# Patient Record
Sex: Female | Born: 1935 | Race: White | Hispanic: No | Marital: Married | Smoking: Former smoker
Health system: Southern US, Community
[De-identification: ages and names within clinical notes are randomized; demographics above are authoritative.]

## PROBLEM LIST (undated history)

## (undated) DIAGNOSIS — E7800 Pure hypercholesterolemia, unspecified: Secondary | ICD-10-CM

## (undated) DIAGNOSIS — J449 Chronic obstructive pulmonary disease, unspecified: Secondary | ICD-10-CM

## (undated) DIAGNOSIS — E039 Hypothyroidism, unspecified: Secondary | ICD-10-CM

## (undated) DIAGNOSIS — Z9889 Other specified postprocedural states: Secondary | ICD-10-CM

## (undated) DIAGNOSIS — C801 Malignant (primary) neoplasm, unspecified: Secondary | ICD-10-CM

## (undated) DIAGNOSIS — I10 Essential (primary) hypertension: Secondary | ICD-10-CM

## (undated) DIAGNOSIS — H269 Unspecified cataract: Secondary | ICD-10-CM

## (undated) DIAGNOSIS — K8020 Calculus of gallbladder without cholecystitis without obstruction: Secondary | ICD-10-CM

## (undated) DIAGNOSIS — I6200 Nontraumatic subdural hemorrhage, unspecified: Secondary | ICD-10-CM

## (undated) DIAGNOSIS — N189 Chronic kidney disease, unspecified: Secondary | ICD-10-CM

## (undated) DIAGNOSIS — R112 Nausea with vomiting, unspecified: Secondary | ICD-10-CM

## (undated) HISTORY — DX: Calculus of gallbladder without cholecystitis without obstruction: K80.20

## (undated) HISTORY — DX: Unspecified cataract: H26.9

## (undated) HISTORY — DX: Chronic obstructive pulmonary disease, unspecified: J44.9

## (undated) HISTORY — PX: BREAST BIOPSY: SHX20

## (undated) HISTORY — DX: Essential (primary) hypertension: I10

## (undated) HISTORY — DX: Hypothyroidism, unspecified: E03.9

## (undated) HISTORY — PX: PARTIAL NEPHRECTOMY: SHX414

## (undated) HISTORY — PX: EYE SURGERY: SHX253

## (undated) HISTORY — PX: OTHER SURGICAL HISTORY: SHX169

## (undated) HISTORY — DX: Pure hypercholesterolemia, unspecified: E78.00

## (undated) HISTORY — PX: DILATION AND CURETTAGE OF UTERUS: SHX78

## (undated) HISTORY — PX: ABDOMINAL HYSTERECTOMY: SHX81

## (undated) NOTE — *Deleted (*Deleted)
Carolyn [name]:[id number] DOB: Mar 26, 1933 DOA: 10/11/2020     PCP: Merlene Laughter, MD   Outpatient Specialists: * NONE CARDS: * Dr. NEphrology: *  Dr. NEurology *   Dr. Pulmonary *  Dr.  Oncology * Dr. Sandria Manly* Dr.  Deboraha Sprang, [facility]) Urology Dr. *  Patient arrived to ER on 10/11/20 at 1452 Referred by Attending Sabino Donovan, MD   Patient coming from: home Lives alone,   *** With family    Chief Complaint:   Chief Complaint  Patient presents with  . Shortness of Breath    HPI: Carolyn [name] is a 3 y.o. female with medical history significant of Dm2. HTN  history of traumatic subdural hematoma who follows up for cognitive changes and episodes of slurred speech , burning tongue syndrome. 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  COPD. CKD, kidney cancer, hypothyroidism, seizure DO  Presented with  Shortness of breath since this AM Have had weight loss and intermittent fevers, decreased appetite and fatigue   Infectious risk factors:  Reports  fever, shortness of breath, dry cough, chest pain, Sore throat, URI symptoms, anosmia/change in taste, N/V/Diarrhea/abdominal pain,  Body aches, severe fatigue Reports known sick contacts, known COVID 19 exposure, inability to completely isolate   Has  been vaccinated against COVID    Initial COVID TEST  NEGATIVE   Lab Results  Component Value Date   SARSCOV2NAA NEGATIVE 10/11/2020    Regarding pertinent Chronic problems:     Hyperlipidemia - *on statins Lipid Panel     Component Value Date/Time   CHOL 155 01/21/2016 0511   TRIG 50 01/21/2016 0511   HDL 59 01/21/2016 0511   CHOLHDL 2.6 01/21/2016 0511   VLDL 10 01/21/2016 0511   LDLCALC 86 01/21/2016 0511    HTN on lisinopril, metoprolol, verapamil  ***chronic CHF diastolic/systolic/ combined - last echo***  *** CAD  - On Aspirin, statin, betablocker, Plavix                 - *followed by cardiology                - last cardiac cath  The ASCVD Risk score Denman George DC  Jr., et al., 2013) failed to calculate for the following reasons:   The 2013 ASCVD risk score is only valid for ages 24 to 62    DM 2 -  Lab Results  Component Value Date   HGBA1C 6.7 (H) 01/21/2016   ****on insulin, PO meds only, diet controlled  ***Hypothyroidism:  Lab Results  Component Value Date   TSH 2.003 10/11/2020   on synthroid  *** Morbid obesity-   BMI Readings from Last 1 Encounters:  10/11/20 25.82 kg/m     *** Asthma -well *** controlled on home inhalers/ nebs f                        ***last no prior***admission  ***                       No ***history of intubation  *** COPD - not **followed by pulmonology *** not  on baseline oxygen  *L,    *** OSA -on nocturnal oxygen, *CPAP, *noncompliant with CPAP  *** Hx of CVA - *with/out residual deficits on Aspirin 81 mg, 325, Plavix  ***A. Fib -  - CHA2DS2 vas score **** CHA2DS2/VAS Stroke Risk Points      N/A >= 2 Points: High  Risk  1 - 1.99 Points: Medium Risk  0 Points: Low Risk    Last Change: N/A      This score determines the patient's risk of having a stroke if the  patient has atrial fibrillation.      This score is not applicable to this patient. Components are not  calculated.     current  on anticoagulation with ****Coumadin  ***Xarelto,* Eliquis,  *** Not on anticoagulation secondary to Risk of Falls, *** recurrent bleeding         -  Rate control:  Currently controlled with ***Toprolol,  *Metoprolol,* Diltiazem, *Coreg          - Rhythm control: *** amiodarone, *flecainide  ***CKD stage III*- baseline Cr **** Estimated Creatinine Clearance: 37.5 mL/min (A) (by C-G formula based on SCr of 1.12 mg/dL (H)).  Lab Results  Component Value Date   CREATININE 1.12 (H) 10/11/2020   CREATININE 0.70 04/05/2017   CREATININE 0.80 04/02/2017     **** Liver disease Computed MELD-Na score unavailable. Necessary lab results were not found in the last year. Computed MELD score unavailable. Necessary  lab results were not found in the last year.   ***BPH - on Flomax, Proscar    *** Dementia - on Aricept** Nemenda  *** Chronic anemia - baseline hg Hemoglobin & Hematocrit  Recent Labs    10/11/20 1550  HGB 7.2*      While in ER:    ER Provider Called:     DrMarland Kitchen  They Recommend admit to medicine *** Will see in AM*  in ER  Hospitalist was called for admission for ***  The following Work up has been ordered so far:  Orders Placed This Encounter  Procedures  . Resp Panel by RT-PCR (Flu A&B, Covid) Nasopharyngeal Swab  . DG Chest Port 1 View  . CT Angio Chest PE W and/or Wo Contrast  . CBC with Differential  . Comprehensive metabolic panel  . Urinalysis, Routine w reflex microscopic  . Magnesium  . D-dimer, quantitative (not at [facility])  . TSH  . Pathologist smear review  . If O2 Sat <94% administer O2 at 2 liters/minute via nasal cannula  . Consult to hospitalist  ALL PATIENTS BEING ADMITTED/HAVING PROCEDURES NEED COVID-19 SCREENING  . ED EKG  . EKG 12-Lead      Following Medications were ordered in ER: Medications  lactated ringers bolus 1,000 mL (0 mLs Intravenous Stopped 10/11/20 1722)  sodium chloride (PF) 0.9 % injection (  Given by Other 10/11/20 1917)  iohexol (OMNIPAQUE) 350 MG/ML injection 60 mL (60 mLs Intravenous Contrast Given 10/11/20 1909)        Consult Orders  (From admission, onward)         Start     Ordered   10/11/20 2022  Consult to hospitalist  ALL PATIENTS BEING ADMITTED/HAVING PROCEDURES NEED COVID-19 SCREENING  Once       Comments: ALL PATIENTS BEING ADMITTED/HAVING PROCEDURES NEED COVID-19 SCREENING  Provider:  (Not yet assigned)  Question Answer Comment  Place call to: [facility]   Reason for Consult Admit      10/11/20 2021            Significant initial  Findings: Abnormal Labs Reviewed  CBC WITH DIFFERENTIAL/PLATELET - Abnormal; Notable for the following components:      Result Value   WBC 83.2 (*)     RBC 2.25 (*)    Hemoglobin 7.2 (*)    HCT 22.0 (*)  RDW 18.0 (*)    Platelets 22 (*)    nRBC 0.3 (*)    Lymphs Abs 7.5 (*)    Monocytes Absolute 5.0 (*)    All other components within normal limits  COMPREHENSIVE METABOLIC PANEL - Abnormal; Notable for the following components:   Sodium 134 (*)    Glucose, Bld 315 (*)    BUN 25 (*)    Creatinine, Ser 1.12 (*)    Albumin 3.1 (*)    AST 51 (*)    ALT 52 (*)    Alkaline Phosphatase 150 (*)    GFR, Estimated 48 (*)    All other components within normal limits  D-DIMER, QUANTITATIVE (NOT AT [facility]) - Abnormal; Notable for the following components:   D-Dimer, Quant 6.11 (*)    All other components within normal limits      Otherwise labs showing:    Recent Labs  Lab 10/11/20 1550  NA 134*  K 3.7  CO2 23  GLUCOSE 315*  BUN 25*  CREATININE 1.12*  CALCIUM 9.7  MG 1.8    Cr  * stable,  Up from baseline see below Lab Results  Component Value Date   CREATININE 1.12 (H) 10/11/2020   CREATININE 0.70 04/05/2017   CREATININE 0.80 04/02/2017    Recent Labs  Lab 10/11/20 1550  AST 51*  ALT 52*  ALKPHOS 150*  BILITOT 0.7  PROT 7.9  ALBUMIN 3.1*   Lab Results  Component Value Date   CALCIUM 9.7 10/11/2020   PHOS 2.3 (L) 02/22/2016          WBC      Component Value Date/Time   WBC 83.2 (HH) 10/11/2020 1550   LYMPHSABS 7.5 (H) 10/11/2020 1550   MONOABS 5.0 (H) 10/11/2020 1550   EOSABS 0.0 10/11/2020 1550   BASOSABS 0.0 10/11/2020 1550        Plt: Lab Results  Component Value Date   PLT 22 (LL) 10/11/2020     Lactic Acid, Venous No results found for: LATICACIDVEN @ (RESUTFAST[lacticacid:4)@  Procalcitonin *** Ordered   COVID-19 Labs  Recent Labs    10/11/20 1550  DDIMER 6.11*    Lab Results  Component Value Date   SARSCOV2NAA NEGATIVE 10/11/2020      Arterial ***Venous  Blood Gas result:  pH *** pCO2 ***; pO2 ***;     %O2 Sat ***.  ABG    Component Value Date/Time   TCO2 29  04/15/2016 1309      HG/HCT * stable,  Down *Up from baseline see below    Component Value Date/Time   HGB 7.2 (L) 10/11/2020 1550   HCT 22.0 (L) 10/11/2020 1550   MCV 97.8 10/11/2020 1550    No results for input(s): LIPASE, AMYLASE in the last 168 hours. No results for input(s): AMMONIA in the last 168 hours.     Troponin ***ordered Cardiac Panel (last 3 results) No results for input(s): CKTOTAL, CKMB, TROPONINI, RELINDX in the last 72 hours.     ECG: Ordered Personally reviewed by me showing: HR : *** Rhythm: *NSR, Sinus tachycardia * A.fib. W RVR, RBBB, LBBB, Paced Ischemic changes*nonspecific changes, no evidence of ischemic changes QTC*   BNP (last 3 results) No results for input(s): BNP in the last 8760 hours.    DM  labs:  HbA1C: No results for input(s): HGBA1C in the last 8760 hours.     CBG (last 3)  No results for input(s): GLUCAP in the last 72 hours.  UA *** no evidence of UTI  ***Pending ***not ordered   Urine analysis:    Component Value Date/Time   COLORURINE YELLOW 10/11/2020 1845   APPEARANCEUR CLEAR 10/11/2020 1845   LABSPEC 1.012 10/11/2020 1845   PHURINE 6.0 10/11/2020 1845   GLUCOSEU NEGATIVE 10/11/2020 1845   HGBUR NEGATIVE 10/11/2020 1845   BILIRUBINUR NEGATIVE 10/11/2020 1845   KETONESUR NEGATIVE 10/11/2020 1845   PROTEINUR NEGATIVE 10/11/2020 1845   UROBILINOGEN 1.0 03/27/2011 1106   NITRITE NEGATIVE 10/11/2020 1845   LEUKOCYTESUR NEGATIVE 10/11/2020 1845       Ordered  CT HEAD *** NON acute  CXR - ***NON acute  CTabd/pelvis - ***nonacute  CTA chest - ***nonacute, no PE, * no evidence of infiltrate      ED Triage Vitals  Enc Vitals Group     BP 10/11/20 1502 139/67     Pulse Rate 10/11/20 1502 84     Resp 10/11/20 1502 20     Temp 10/11/20 1502 98 F (36.7 C)     Temp Source 10/11/20 1502 Oral     SpO2 10/11/20 1502 99 %     Weight 10/11/20 1503 160 lb (72.6 kg)     Height 10/11/20 1503 5\' 6"  (1.676 m)      Head Circumference --      Peak Flow --      Pain Score 10/11/20 1503 0     Pain Loc --      Pain Edu? --      Excl. in GC? --   TMAX(24)@       Latest  Blood pressure 121/68, pulse 77, temperature 98 F (36.7 C), temperature source Oral, resp. rate (!) 24, height 5\' 6"  (1.676 m), weight 72.6 kg, last menstrual period 03/24/2010, SpO2 98 %.       Review of Systems:    Pertinent positives include: ***  Constitutional:  No weight loss, night sweats, Fevers, chills, fatigue, weight loss  HEENT:  No headaches, Difficulty swallowing,Tooth/dental problems,Sore throat,  No sneezing, itching, ear ache, nasal congestion, post nasal drip,  Cardio-vascular:  No chest pain, Orthopnea, PND, anasarca, dizziness, palpitations.no Bilateral lower extremity swelling  GI:  No heartburn, indigestion, abdominal pain, nausea, vomiting, diarrhea, change in bowel habits, loss of appetite, melena, blood in stool, hematemesis Resp:  no shortness of breath at rest. No dyspnea on exertion, No excess mucus, no productive cough, No non-productive cough, No coughing up of blood.No change in color of mucus.No wheezing. Skin:  no rash or lesions. No jaundice GU:  no dysuria, change in color of urine, no urgency or frequency. No straining to urinate.  No flank pain.  Musculoskeletal:  No joint pain or no joint swelling. No decreased range of motion. No back pain.  Psych:  No change in mood or affect. No depression or anxiety. No memory loss.  Neuro: no localizing neurological complaints, no tingling, no weakness, no double vision, no gait abnormality, no slurred speech, no confusion  All systems reviewed and apart from HOPI all are negative  Past Medical History:   Past Medical History:  Diagnosis Date  . Asthma   . Cancer (HCC)   . Cataracts, bilateral   . Chronic kidney disease 2007   kidney cancer and partial nephrctomy-right  . COPD (chronic obstructive pulmonary disease) (HCC)   . Diabetes  mellitus   . Gallstones   . Glaucoma   . Hypercholesterolemia   . Hypertension   . Hypothyroid   . PONV (postoperative nausea  and vomiting)   . Subdural bleeding [facility])       Past Surgical History:  Procedure Laterality Date  . ABDOMINAL HYSTERECTOMY    . BREAST BIOPSY    . Cateract extractions and IOL    . CHOLECYSTECTOMY  2008  . CRANIOTOMY Left 02/06/2016   Procedure: CRANIOTOMY HEMATOMA EVACUATION SUBDURAL;  Surgeon: Loura Halt Ditty, MD;  Location: [facility] NEUR[facility]S;  Service: Neurosurgery;  Laterality: Left;  . CRANIOTOMY N/A 02/17/2016   Procedure: CRANIOTOMY HEMATOMA EVACUATION SUBDURAL;  Surgeon: Loura Halt Ditty, MD;  Location: [facility] NEURO ORS;  Service: Neurosurgery;  Laterality: N/A;  left  . CRANIOTOMY Left 02/18/2016   Procedure: CRANIOTOMY HEMATOMA EVACUATION SUBDURAL;  Surgeon: Julio Sicks, MD;  Location: [facility] NEURO ORS;  Service: Neurosurgery;  Laterality: Left;  . D&C for enlarged uterine  210  . DILATION AND CURETTAGE OF UTERUS    . EYE SURGERY  08/2011,09/2012   bilateral cataracts  . HERNIA REPAIR  01/19/12   inguinal  . INGUINAL HERNIA REPAIR  01/19/2012   Procedure: HERNIA REPAIR INGUINAL ADULT;  Surgeon: Velora Heckler, MD;  Location: [facility] ORS;  Service: General;  Laterality: Left;  repair left inguinal hernia with mesh   . PARTIAL NEPHRECTOMY     cancer  . TOTAL ABDOMINAL HYSTERECTOMY W/ BILATERAL SALPINGOOPHORECTOMY  2011    Social History:  Ambulatory *** independently cane, walker  wheelchair bound, bed bound     reports that she quit smoking about 38 years ago. Her smoking use included cigarettes. She has a 5.00 pack-year smoking history. She has never used smokeless tobacco. She reports that she does not drink alcohol and does not use drugs.     Family History: *** Family History  Problem Relation Age of Onset  . Hypertension Mother   . Hypertension Father   . Breast cancer Paternal Grandmother   . Diabetes Paternal Grandmother   . Hypertension  Paternal Grandmother   . Stroke Paternal Grandmother   . Stroke Paternal Grandfather   . Heart attack Paternal Grandfather     Allergies: Allergies  Allergen Reactions  . Darvocet [Propoxyphene N-Acetaminophen] Nausea And Vomiting  . Metformin And Related Diarrhea  . Percocet [Oxycodone-Acetaminophen] Nausea And Vomiting  . Prednisone Swelling  . Topamax [Topiramate] Other (See Comments)    Unable to speak/swallow  . Tramadol Other (See Comments)    Masked seizure symptoms  . Vimpat [Lacosamide] Swelling    tongue  . Vicodin [Hydrocodone-Acetaminophen] Nausea And Vomiting     Prior to Admission medications   Medication Sig Start Date End Date Taking? Authorizing Provider  acetaminophen (TYLENOL) 500 MG tablet Take 500 mg by mouth every 6 (six) hours as needed for headache (pain).    [provider]  albuterol (PROVENTIL HFA;VENTOLIN HFA) 108 (90 Base) MCG/ACT inhaler Inhale 1 puff into the lungs every 6 (six) hours as needed for wheezing or shortness of breath.    [provider]  docusate sodium (ENEMEEZ) 283 MG enema Place 1 enema (283 mg total) rectally daily. Patient not taking: Reported on 09/21/2017 04/02/17   Gerhard Munch, MD  Fluticasone-Salmeterol (ADVAIR) 250-50 MCG/DOSE AEPB Inhale 1 puff into the lungs 2 (two) times daily as needed (shortness of breath/ chest tightness).    [provider]  glimepiride (AMARYL) 1 MG tablet Take 1 mg by mouth at bedtime.    [provider]  glimepiride (AMARYL) 2 MG tablet Take 2 tablets (4 mg total) by mouth at bedtime. Patient not taking: Reported on  04/05/2017 03/06/16   Angiulli, Mcarthur Rossetti, PA-C  latanoprost (XALATAN) 0.005 % ophthalmic solution Place 1 drop into both eyes at bedtime.     [provider]  levothyroxine (SYNTHROID) 88 MCG tablet Take 88 mcg by mouth every morning. 04/04/20   [provider]  lisinopril (PRINIVIL,ZESTRIL) 20 MG tablet Take 1 tablet (20 mg total) by  mouth at bedtime. 03/06/16   Angiulli, Mcarthur Rossetti, PA-C  metoprolol tartrate (LOPRESSOR) 25 MG tablet Take 1 tablet (25 mg total) by mouth 2 (two) times daily. Patient not taking: Reported on 04/05/2017 03/06/16   Angiulli, Mcarthur Rossetti, PA-C  ondansetron (ZOFRAN ODT) 4 MG disintegrating tablet Take 1 tablet (4 mg total) by mouth every 8 (eight) hours as needed for nausea or vomiting. Patient not taking: Reported on 04/29/2020 04/05/17   Joy, Ines Bloomer C, PA-C  ondansetron (ZOFRAN) 4 MG tablet Take 1 tablet (4 mg total) by mouth every 8 (eight) hours as needed for nausea or vomiting. Patient not taking: Reported on 04/29/2020 03/24/17   Virgina Norfolk, DO  pantoprazole (PROTONIX) 40 MG tablet Take 1 tablet (40 mg total) by mouth daily. 03/06/16   Angiulli, Mcarthur Rossetti, PA-C  polyethylene glycol (MIRALAX / GLYCOLAX) packet Take 17 g by mouth daily as needed (constipation). Mix in 8 oz liquid and drink    [provider]  theophylline (UNIPHYL) 400 MG 24 hr tablet Take 400 mg by mouth at bedtime.     [provider]  thyroid (ARMOUR) 60 MG tablet Take 60 mg by mouth at bedtime.  Patient not taking: Reported on 04/29/2020    [provider]  verapamil (CALAN-SR) 180 MG CR tablet Take 180 mg by mouth at bedtime.    [provider]   Physical Exam: Vitals with BMI 10/11/2020 10/11/2020 10/11/2020  Height - - -  Weight - - -  BMI - - -  Systolic 121 118 161  Diastolic 68 73 60  Pulse 77 - 74     1. General:  in No ***Acute distress***increased work of breathing ***complaining of severe pain****agitated * Chronically ill *well *cachectic *toxic acutely ill -appearing 2. Psychological: Alert and *** Oriented 3. Head/ENT:   Moist *** Dry Mucous Membranes                          Head Non traumatic, neck supple                          Normal *** Poor Dentition 4. SKIN: normal *** decreased Skin turgor,  Skin clean Dry and intact no rash 5. Heart: Regular rate and rhythm no***  Murmur, no Rub or gallop 6. Lungs: ***Clear to auscultation bilaterally, no wheezes or crackles   7. Abdomen: Soft, ***non-tender, Non distended *** obese ***bowel sounds present 8. Lower extremities: no clubbing, cyanosis, no ***edema 9. Neurologically Grossly intact, moving all 4 extremities equally *** strength 5 out of 5 in all 4 extremities cranial nerves II through XII intact 10. MSK: Normal range of motion   All other LABS:     Recent Labs  Lab 10/11/20 1550  WBC 83.2*  NEUTROABS 1.7  HGB 7.2*  HCT 22.0*  MCV 97.8  PLT 22*     Recent Labs  Lab 10/11/20 1550  NA 134*  K 3.7  CL 99  CO2 23  GLUCOSE 315*  BUN 25*  CREATININE 1.12*  CALCIUM 9.7  MG 1.8  Recent Labs  Lab 10/11/20 1550  AST 51*  ALT 52*  ALKPHOS 150*  BILITOT 0.7  PROT 7.9  ALBUMIN 3.1*       Cultures:    Component Value Date/Time   SDES URINE, CLEAN CATCH 03/24/2017 1653   SPECREQUEST Normal 03/24/2017 1653   CULT MULTIPLE SPECIES PRESENT, SUGGEST RECOLLECTION (A) 03/24/2017 1653   REPTSTATUS 03/26/2017 FINAL 03/24/2017 1653     Radiological Exams on Admission: CT Angio Chest PE W and/or Wo Contrast  Result Date: 10/11/2020 CLINICAL DATA:  52 year old female with positive D-dimer. Concern for pulmonary embolism. EXAM: CT ANGIOGRAPHY CHEST WITH CONTRAST TECHNIQUE: Multidetector CT imaging of the chest was performed using the standard protocol during bolus administration of intravenous contrast. Multiplanar CT image reconstructions and MIPs were obtained to evaluate the vascular anatomy. CONTRAST:  60mL OMNIPAQUE IOHEXOL 350 MG/ML SOLN COMPARISON:  Chest CT dated 10/15/2011. Chest radiograph dated 10/11/2020 FINDINGS: Cardiovascular: There is mild cardiomegaly. Three vessel coronary vascular calcification. No pericardial effusion. Top-normal ascending aorta measuring up to 4 cm on sagittal AP diameter. There is mild atherosclerotic calcification of the thoracic aorta. There is  dilatation of the main pulmonary trunk suggestive of a degree of pulmonary hypertension. Clinical correlation is recommended. Evaluation of the pulmonary arteries is limited due to respiratory motion artifact. No large or central pulmonary artery embolus identified. Mediastinum/Nodes: There is no hilar or mediastinal adenopathy. The esophagus is grossly unremarkable. No mediastinal fluid collection. Lungs/Pleura: There is a 5 mm left upper lobe nodule not significantly changed since the study of 2012. 5 mm nodule in the left lower lobe along the fissure (66/6) increased in size since study of 2012. There is a 1 cm left lung base subpleural nodule (94/6). No lobar consolidation, pleural effusion or pneumothorax. The central airways are patent. Upper Abdomen: No acute abnormality. Musculoskeletal: No chest wall abnormality. No acute or significant osseous findings. Review of the MIP images confirms the above findings. IMPRESSION: 1. No acute intrathoracic pathology. No CT evidence of central pulmonary artery embolus. 2. Mild cardiomegaly with three vessel coronary vascular calcification. 3. Dilatation of the main pulmonary trunk suggestive of a degree of pulmonary hypertension. Clinical correlation is recommended. 4. Several pulmonary nodules measuring up to 1 cm in the left lower lobe. Non-contrast chest CT at 3-6 months is recommended. If the nodules are stable at time of repeat CT, then future CT at 18-24 months (from today's scan) is considered optional for low-risk patients, but is recommended for high-risk patients. This recommendation follows the consensus statement: Guidelines for Management of Incidental Pulmonary Nodules Detected on CT Images: From the Fleischner Society 2017; Radiology 2017; 284:228-243. 5. Aortic Atherosclerosis (ICD10-I70.0). Electronically Signed   By: Elgie Collard M.D.   On: 10/11/2020 19:39   DG Chest Port 1 View  Result Date: 10/11/2020 CLINICAL DATA:  Shortness of breath  EXAM: PORTABLE CHEST 1 VIEW COMPARISON:  03/20/2020 FINDINGS: No focal opacity or pleural effusion. Normal cardiomediastinal silhouette with aortic atherosclerosis. No pneumothorax. IMPRESSION: No active disease. Electronically Signed   By: Jasmine Pang M.D.   On: 10/11/2020 16:46    Chart has been reviewed    Assessment/Plan  ***  Admitted for ***  Present on Admission: **None**   Other plan as per orders.  DVT prophylaxis:  SCD *** Lovenox       Code Status:    Code Status: Prior FULL CODE *** DNR/DNI ***comfort care as per patient ***family  I had personally discussed CODE STATUS with patient and  family* I had spent *min discussing goals of care and CODE STATUS    Family Communication:   Family not at  Bedside  plan of care was discussed on the phone with *** Son, Daughter, Wife, Husband, Sister, Brother , father, mother  Disposition Plan:   *** likely will need placement for rehabilitation                          Back to current facility when stable                            To home once workup is complete and patient is stable  ***Following barriers for discharge:                            Electrolytes corrected                               Anemia corrected                             Pain controlled with PO medications                               Afebrile, white count improving able to transition to PO antibiotics                             Will need to be able to tolerate PO                            Will likely need home health, home O2, set up                           Will need consultants to evaluate patient prior to discharge  ****EXPECT DC tomorrow                    ***Would benefit from PT/OT eval prior to DC  Ordered                   Swallow eval - SLP ordered                   Diabetes care coordinator                   Transition of care consulted                   Nutrition    consulted                  Wound care  consulted                    Palliative care    consulted                   Behavioral health  consulted                    Consults called: ***    Admission status:  ED Disposition    None  Obs***  ***  inpatient     I Expect 2 midnight stay secondary to severity of patient's current illness need for inpatient interventions justified by the following: ***hemodynamic instability despite optimal treatment (tachycardia *hypotension * tachypnea *hypoxia, hypercapnia) * Severe lab/radiological/exam abnormalities including:     and extensive comorbidities including: *substance abuse  *Chronic pain *DM2  * CHF * CAD  * COPD/asthma *Morbid Obesity * CKD *dementia *liver disease *history of stroke with residual deficits *  malignancy, * sickle cell disease  . History of amputation . Chronic anticoagulation  That are currently affecting medical management.   I expect  patient to be hospitalized for 2 midnights requiring inpatient medical care.  Patient is at high risk for adverse outcome (such as loss of life or disability) if not treated.  Indication for inpatient stay as follows:  Severe change from baseline regarding mental status Hemodynamic instability despite maximal medical therapy,  ongoing suicidal ideations,  severe pain requiring acute inpatient management,  inability to maintain oral hydration   persistent chest pain despite medical management Need for operative/procedural  intervention New or worsening hypoxia  Need for IV antibiotics, IV fluids, IV rate controling medications, IV antihypertensives, IV pain medications, IV anticoagulation    Level of care   *** tele  For 12H 24H     medical floor       SDU tele indefinitely please discontinue once patient no longer qualifies COVID-19 Labs    Lab Results  Component Value Date   SARSCOV2NAA NEGATIVE 10/11/2020     Precautions: admitted as *** Covid Negative  ***asymptomatic screening protocol****PUI *** covid positive  No active isolations ***If Covid PCR is negative  - please DC precautions - would need additional investigation given very high risk for false native test result   PPE: Used by the provider:   N95***P100  eye Goggles,  Gloves ***gown     Critical***  Patient is critically ill due to  hemodynamic instability * respiratory failure *severe sepsis* ongoing chest pain*  They are at high risk for life/limb threatening clinical deterioration requiring frequent reassessment and modifications of care.  Services provided include examination of the patient, review of relevant ancillary tests, prescription of lifesaving therapies, review of medications and prophylactic therapy.  Total critical care time excluding separately billable procedures: 60*  Minutes.    Therisa Doyne 10/11/2020, 8:35 PM ***  [facility]     after 2 AM please page floor coverage PA If 7AM-7PM, please contact the day team taking care of the patient using [email or web address]   Patient was evaluated in the context of the global COVID-19 pandemic, which necessitated consideration that the patient might be at risk for infection with the SARS-CoV-2 virus that causes COVID-19. Institutional protocols and algorithms that pertain to the evaluation of patients at risk for COVID-19 are in a state of rapid change based on information released by regulatory bodies including the CDC and federal and state organizations. These policies and algorithms were followed during the patient's care.

---

## 1998-03-08 ENCOUNTER — Ambulatory Visit (HOSPITAL_BASED_OUTPATIENT_CLINIC_OR_DEPARTMENT_OTHER): Admission: RE | Admit: 1998-03-08 | Discharge: 1998-03-08 | Payer: Self-pay | Admitting: Surgery

## 2000-04-14 ENCOUNTER — Other Ambulatory Visit: Admission: RE | Admit: 2000-04-14 | Discharge: 2000-04-14 | Payer: Self-pay | Admitting: *Deleted

## 2000-04-20 ENCOUNTER — Encounter: Payer: Self-pay | Admitting: *Deleted

## 2000-04-20 ENCOUNTER — Encounter: Admission: RE | Admit: 2000-04-20 | Discharge: 2000-04-20 | Payer: Self-pay | Admitting: *Deleted

## 2000-05-04 ENCOUNTER — Encounter (INDEPENDENT_AMBULATORY_CARE_PROVIDER_SITE_OTHER): Payer: Self-pay

## 2000-05-04 ENCOUNTER — Other Ambulatory Visit: Admission: RE | Admit: 2000-05-04 | Discharge: 2000-05-04 | Payer: Self-pay | Admitting: *Deleted

## 2001-09-14 ENCOUNTER — Encounter: Payer: Self-pay | Admitting: Internal Medicine

## 2001-09-14 ENCOUNTER — Encounter: Admission: RE | Admit: 2001-09-14 | Discharge: 2001-09-14 | Payer: Self-pay | Admitting: Internal Medicine

## 2001-09-14 ENCOUNTER — Other Ambulatory Visit: Admission: RE | Admit: 2001-09-14 | Discharge: 2001-09-14 | Payer: Self-pay | Admitting: Surgery

## 2001-09-14 ENCOUNTER — Encounter: Admission: RE | Admit: 2001-09-14 | Discharge: 2001-09-14 | Payer: Self-pay | Admitting: Surgery

## 2001-09-14 ENCOUNTER — Encounter (INDEPENDENT_AMBULATORY_CARE_PROVIDER_SITE_OTHER): Payer: Self-pay | Admitting: Specialist

## 2001-09-14 ENCOUNTER — Encounter: Payer: Self-pay | Admitting: Surgery

## 2001-12-13 ENCOUNTER — Other Ambulatory Visit: Admission: RE | Admit: 2001-12-13 | Discharge: 2001-12-13 | Payer: Self-pay | Admitting: Internal Medicine

## 2004-07-01 ENCOUNTER — Other Ambulatory Visit: Admission: RE | Admit: 2004-07-01 | Discharge: 2004-07-01 | Payer: Self-pay | Admitting: Internal Medicine

## 2004-07-09 ENCOUNTER — Encounter: Admission: RE | Admit: 2004-07-09 | Discharge: 2004-07-09 | Payer: Self-pay | Admitting: Internal Medicine

## 2005-11-16 DIAGNOSIS — N189 Chronic kidney disease, unspecified: Secondary | ICD-10-CM

## 2005-11-16 HISTORY — DX: Chronic kidney disease, unspecified: N18.9

## 2006-03-18 ENCOUNTER — Ambulatory Visit (HOSPITAL_COMMUNITY): Admission: RE | Admit: 2006-03-18 | Discharge: 2006-03-18 | Payer: Self-pay | Admitting: Gastroenterology

## 2006-03-18 ENCOUNTER — Encounter (INDEPENDENT_AMBULATORY_CARE_PROVIDER_SITE_OTHER): Payer: Self-pay | Admitting: Specialist

## 2006-05-10 ENCOUNTER — Encounter: Admission: RE | Admit: 2006-05-10 | Discharge: 2006-05-10 | Payer: Self-pay | Admitting: Gastroenterology

## 2006-05-24 ENCOUNTER — Ambulatory Visit (HOSPITAL_COMMUNITY): Admission: RE | Admit: 2006-05-24 | Discharge: 2006-05-24 | Payer: Self-pay | Admitting: Urology

## 2006-07-05 ENCOUNTER — Inpatient Hospital Stay (HOSPITAL_COMMUNITY): Admission: RE | Admit: 2006-07-05 | Discharge: 2006-07-08 | Payer: Self-pay | Admitting: Urology

## 2006-07-05 ENCOUNTER — Encounter (INDEPENDENT_AMBULATORY_CARE_PROVIDER_SITE_OTHER): Payer: Self-pay | Admitting: *Deleted

## 2006-09-21 ENCOUNTER — Other Ambulatory Visit: Admission: RE | Admit: 2006-09-21 | Discharge: 2006-09-21 | Payer: Self-pay | Admitting: Internal Medicine

## 2006-10-05 ENCOUNTER — Encounter: Admission: RE | Admit: 2006-10-05 | Discharge: 2006-10-05 | Payer: Self-pay | Admitting: Internal Medicine

## 2006-10-14 ENCOUNTER — Encounter: Admission: RE | Admit: 2006-10-14 | Discharge: 2006-10-14 | Payer: Self-pay | Admitting: Internal Medicine

## 2006-11-16 HISTORY — PX: CHOLECYSTECTOMY: SHX55

## 2007-01-27 ENCOUNTER — Ambulatory Visit (HOSPITAL_COMMUNITY): Admission: RE | Admit: 2007-01-27 | Discharge: 2007-01-27 | Payer: Self-pay | Admitting: Urology

## 2007-08-04 ENCOUNTER — Ambulatory Visit (HOSPITAL_COMMUNITY): Admission: RE | Admit: 2007-08-04 | Discharge: 2007-08-04 | Payer: Self-pay | Admitting: Urology

## 2007-08-24 ENCOUNTER — Encounter: Admission: RE | Admit: 2007-08-24 | Discharge: 2007-08-24 | Payer: Self-pay | Admitting: Surgery

## 2007-09-08 ENCOUNTER — Encounter (INDEPENDENT_AMBULATORY_CARE_PROVIDER_SITE_OTHER): Payer: Self-pay | Admitting: Surgery

## 2007-09-08 ENCOUNTER — Ambulatory Visit (HOSPITAL_COMMUNITY): Admission: RE | Admit: 2007-09-08 | Discharge: 2007-09-09 | Payer: Self-pay | Admitting: Surgery

## 2007-11-30 ENCOUNTER — Encounter: Admission: RE | Admit: 2007-11-30 | Discharge: 2007-11-30 | Payer: Self-pay | Admitting: Internal Medicine

## 2007-12-05 ENCOUNTER — Encounter: Admission: RE | Admit: 2007-12-05 | Discharge: 2007-12-05 | Payer: Self-pay | Admitting: Internal Medicine

## 2008-02-06 ENCOUNTER — Ambulatory Visit (HOSPITAL_COMMUNITY): Admission: RE | Admit: 2008-02-06 | Discharge: 2008-02-06 | Payer: Self-pay | Admitting: Urology

## 2008-08-01 ENCOUNTER — Ambulatory Visit (HOSPITAL_COMMUNITY): Admission: RE | Admit: 2008-08-01 | Discharge: 2008-08-01 | Payer: Self-pay | Admitting: Urology

## 2009-03-11 ENCOUNTER — Encounter: Admission: RE | Admit: 2009-03-11 | Discharge: 2009-03-11 | Payer: Self-pay | Admitting: Internal Medicine

## 2009-08-14 ENCOUNTER — Ambulatory Visit (HOSPITAL_COMMUNITY): Admission: RE | Admit: 2009-08-14 | Discharge: 2009-08-14 | Payer: Self-pay | Admitting: Urology

## 2009-11-16 HISTORY — PX: TOTAL ABDOMINAL HYSTERECTOMY W/ BILATERAL SALPINGOOPHORECTOMY: SHX83

## 2010-05-07 ENCOUNTER — Encounter (INDEPENDENT_AMBULATORY_CARE_PROVIDER_SITE_OTHER): Payer: Self-pay | Admitting: Obstetrics and Gynecology

## 2010-05-07 ENCOUNTER — Inpatient Hospital Stay (HOSPITAL_COMMUNITY): Admission: RE | Admit: 2010-05-07 | Discharge: 2010-05-09 | Payer: Self-pay | Admitting: Obstetrics and Gynecology

## 2011-02-01 LAB — GLUCOSE, CAPILLARY
Glucose-Capillary: 146 mg/dL — ABNORMAL HIGH (ref 70–99)
Glucose-Capillary: 150 mg/dL — ABNORMAL HIGH (ref 70–99)
Glucose-Capillary: 160 mg/dL — ABNORMAL HIGH (ref 70–99)
Glucose-Capillary: 193 mg/dL — ABNORMAL HIGH (ref 70–99)
Glucose-Capillary: 197 mg/dL — ABNORMAL HIGH (ref 70–99)
Glucose-Capillary: 219 mg/dL — ABNORMAL HIGH (ref 70–99)
Glucose-Capillary: 237 mg/dL — ABNORMAL HIGH (ref 70–99)
Glucose-Capillary: 245 mg/dL — ABNORMAL HIGH (ref 70–99)

## 2011-02-01 LAB — CBC
HCT: 33.3 % — ABNORMAL LOW (ref 36.0–46.0)
HCT: 42.2 % (ref 36.0–46.0)
Hemoglobin: 11.5 g/dL — ABNORMAL LOW (ref 12.0–15.0)
Hemoglobin: 14.4 g/dL (ref 12.0–15.0)
MCH: 30.2 pg (ref 26.0–34.0)
MCHC: 34 g/dL (ref 30.0–36.0)
MCHC: 34.5 g/dL (ref 30.0–36.0)
MCV: 87.6 fL (ref 78.0–100.0)
MCV: 88 fL (ref 78.0–100.0)
Platelets: 165 10*3/uL (ref 150–400)
Platelets: 216 10*3/uL (ref 150–400)
RBC: 3.8 MIL/uL — ABNORMAL LOW (ref 3.87–5.11)
RBC: 4.8 MIL/uL (ref 3.87–5.11)
RDW: 13.8 % (ref 11.5–15.5)
RDW: 13.9 % (ref 11.5–15.5)
WBC: 10 10*3/uL (ref 4.0–10.5)
WBC: 10.4 10*3/uL (ref 4.0–10.5)

## 2011-02-01 LAB — BASIC METABOLIC PANEL
BUN: 9 mg/dL (ref 6–23)
CO2: 26 mEq/L (ref 19–32)
Calcium: 9.7 mg/dL (ref 8.4–10.5)
Chloride: 106 mEq/L (ref 96–112)
Creatinine, Ser: 0.74 mg/dL (ref 0.4–1.2)
GFR calc Af Amer: >60 mL/min (ref >60)
GFR calc non Af Amer: >60 mL/min (ref >60)
Glucose, Bld: 230 mg/dL — ABNORMAL HIGH (ref 70–99)
Potassium: 3.4 mEq/L — ABNORMAL LOW (ref 3.5–5.1)
Sodium: 137 mEq/L (ref 135–145)

## 2011-02-01 LAB — SURGICAL PCR SCREEN
MRSA, PCR: NEGATIVE
Staphylococcus aureus: NEGATIVE

## 2011-03-25 ENCOUNTER — Other Ambulatory Visit: Payer: Self-pay | Admitting: Gastroenterology

## 2011-03-25 ENCOUNTER — Ambulatory Visit
Admission: RE | Admit: 2011-03-25 | Discharge: 2011-03-25 | Disposition: A | Discharge status: Home / Self Care | Payer: Medicare Other | Source: Ambulatory Visit | Attending: Gastroenterology | Admitting: Gastroenterology

## 2011-03-25 DIAGNOSIS — R197 Diarrhea, unspecified: Secondary | ICD-10-CM

## 2011-03-25 DIAGNOSIS — R1084 Generalized abdominal pain: Secondary | ICD-10-CM

## 2011-03-27 ENCOUNTER — Emergency Department (HOSPITAL_COMMUNITY): Payer: Medicare Other

## 2011-03-27 ENCOUNTER — Inpatient Hospital Stay (HOSPITAL_COMMUNITY)
Admission: EM | Admit: 2011-03-27 | Discharge: 2011-03-30 | DRG: 392 | Disposition: A | Discharge status: Home / Self Care | Payer: Medicare Other | Attending: Internal Medicine | Admitting: Internal Medicine

## 2011-03-27 DIAGNOSIS — K219 Gastro-esophageal reflux disease without esophagitis: Secondary | ICD-10-CM | POA: Diagnosis present

## 2011-03-27 DIAGNOSIS — Z905 Acquired absence of kidney: Secondary | ICD-10-CM

## 2011-03-27 DIAGNOSIS — K5289 Other specified noninfective gastroenteritis and colitis: Secondary | ICD-10-CM | POA: Diagnosis present

## 2011-03-27 DIAGNOSIS — E119 Type 2 diabetes mellitus without complications: Secondary | ICD-10-CM | POA: Diagnosis present

## 2011-03-27 DIAGNOSIS — E039 Hypothyroidism, unspecified: Secondary | ICD-10-CM | POA: Diagnosis present

## 2011-03-27 DIAGNOSIS — N318 Other neuromuscular dysfunction of bladder: Secondary | ICD-10-CM | POA: Diagnosis present

## 2011-03-27 DIAGNOSIS — J4489 Other specified chronic obstructive pulmonary disease: Secondary | ICD-10-CM | POA: Diagnosis present

## 2011-03-27 DIAGNOSIS — K5909 Other constipation: Principal | ICD-10-CM | POA: Diagnosis present

## 2011-03-27 DIAGNOSIS — J449 Chronic obstructive pulmonary disease, unspecified: Secondary | ICD-10-CM | POA: Diagnosis present

## 2011-03-27 DIAGNOSIS — I10 Essential (primary) hypertension: Secondary | ICD-10-CM | POA: Diagnosis present

## 2011-03-27 DIAGNOSIS — Z85528 Personal history of other malignant neoplasm of kidney: Secondary | ICD-10-CM

## 2011-03-27 DIAGNOSIS — R112 Nausea with vomiting, unspecified: Secondary | ICD-10-CM | POA: Diagnosis present

## 2011-03-27 DIAGNOSIS — E7800 Pure hypercholesterolemia, unspecified: Secondary | ICD-10-CM | POA: Diagnosis present

## 2011-03-27 LAB — DIFFERENTIAL
Basophils Absolute: 0 10*3/uL (ref 0.0–0.1)
Basophils Relative: 0 % (ref 0–1)
Eosinophils Absolute: 0.8 10*3/uL — ABNORMAL HIGH (ref 0.0–0.7)
Eosinophils Relative: 9 % — ABNORMAL HIGH (ref 0–5)
Lymphocytes Relative: 23 % (ref 12–46)
Lymphs Abs: 2.1 10*3/uL (ref 0.7–4.0)
Monocytes Absolute: 0.6 10*3/uL (ref 0.1–1.0)
Monocytes Relative: 6 % (ref 3–12)
Neutro Abs: 5.7 10*3/uL (ref 1.7–7.7)
Neutrophils Relative %: 62 % (ref 43–77)

## 2011-03-27 LAB — COMPREHENSIVE METABOLIC PANEL
ALT: 6 U/L (ref 0–35)
AST: 11 U/L (ref 0–37)
Albumin: 3.5 g/dL (ref 3.5–5.2)
Alkaline Phosphatase: 66 U/L (ref 39–117)
BUN: 9 mg/dL (ref 6–23)
CO2: 26 mEq/L (ref 19–32)
Calcium: 9.5 mg/dL (ref 8.4–10.5)
Chloride: 101 mEq/L (ref 96–112)
Creatinine, Ser: 0.61 mg/dL (ref 0.4–1.2)
GFR calc Af Amer: >60 mL/min (ref >60)
GFR calc non Af Amer: >60 mL/min (ref >60)
Glucose, Bld: 146 mg/dL — ABNORMAL HIGH (ref 70–99)
Potassium: 3.6 mEq/L (ref 3.5–5.1)
Sodium: 136 mEq/L (ref 135–145)
Total Bilirubin: 0.6 mg/dL (ref 0.3–1.2)
Total Protein: 6.8 g/dL (ref 6.0–8.3)

## 2011-03-27 LAB — URINALYSIS, ROUTINE W REFLEX MICROSCOPIC
Bilirubin Urine: NEGATIVE
Glucose, UA: NEGATIVE mg/dL
Hgb urine dipstick: NEGATIVE
Nitrite: NEGATIVE
Protein, ur: NEGATIVE mg/dL
Specific Gravity, Urine: 1.018 (ref 1.005–1.030)
Urobilinogen, UA: 1 mg/dL (ref 0.0–1.0)
pH: 6.5 (ref 5.0–8.0)

## 2011-03-27 LAB — CBC
HCT: 41.7 % (ref 36.0–46.0)
Hemoglobin: 13.9 g/dL (ref 12.0–15.0)
MCH: 28.8 pg (ref 26.0–34.0)
MCHC: 33.3 g/dL (ref 30.0–36.0)
MCV: 86.3 fL (ref 78.0–100.0)
Platelets: 204 10*3/uL (ref 150–400)
RBC: 4.83 MIL/uL (ref 3.87–5.11)
RDW: 13.9 % (ref 11.5–15.5)
WBC: 9.2 10*3/uL (ref 4.0–10.5)

## 2011-03-27 LAB — LIPASE, BLOOD: Lipase: 10 U/L — ABNORMAL LOW (ref 11–59)

## 2011-03-27 MED ORDER — IOHEXOL 300 MG/ML  SOLN
100.0000 mL | Freq: Once | INTRAMUSCULAR | Status: AC | PRN
  Administered 2011-03-27: 100 mL via INTRAVENOUS

## 2011-03-28 LAB — GLUCOSE, CAPILLARY
Glucose-Capillary: 86 mg/dL (ref 70–99)
Glucose-Capillary: 110 mg/dL — ABNORMAL HIGH (ref 70–99)

## 2011-03-28 LAB — CBC
HCT: 39.2 % (ref 36.0–46.0)
Hemoglobin: 13.1 g/dL (ref 12.0–15.0)
MCH: 28.8 pg (ref 26.0–34.0)
MCHC: 33.4 g/dL (ref 30.0–36.0)
MCV: 86.2 fL (ref 78.0–100.0)
Platelets: 179 10*3/uL (ref 150–400)
RBC: 4.55 MIL/uL (ref 3.87–5.11)
RDW: 14 % (ref 11.5–15.5)
WBC: 8.2 10*3/uL (ref 4.0–10.5)

## 2011-03-28 LAB — BASIC METABOLIC PANEL
BUN: 5 mg/dL — ABNORMAL LOW (ref 6–23)
CO2: 26 mEq/L (ref 19–32)
Calcium: 8.9 mg/dL (ref 8.4–10.5)
Chloride: 107 mEq/L (ref 96–112)
Creatinine, Ser: 0.64 mg/dL (ref 0.4–1.2)
GFR calc Af Amer: >60 mL/min (ref >60)
GFR calc non Af Amer: >60 mL/min (ref >60)
Glucose, Bld: 75 mg/dL (ref 70–99)
Potassium: 3.6 mEq/L (ref 3.5–5.1)
Sodium: 140 mEq/L (ref 135–145)

## 2011-03-29 LAB — GLUCOSE, CAPILLARY: Glucose-Capillary: 123 mg/dL — ABNORMAL HIGH (ref 70–99)

## 2011-03-30 LAB — GLUCOSE, CAPILLARY: Glucose-Capillary: 103 mg/dL — ABNORMAL HIGH (ref 70–99)

## 2011-03-31 NOTE — Op Note (Signed)
Carolyn [name], Carolyn [name]                ACCOUNT NO.:  [id number]   MEDICAL RECORD NO.:  [id number]          PATIENT TYPE:  OIB   LOCATION:  1530                         FACILITY:  [facility]   PHYSICIAN:  Velora Heckler, MD      DATE OF BIRTH:  [birth year]   DATE OF PROCEDURE:  09/08/2007  DATE OF DISCHARGE:  09/09/2007                               OPERATIVE REPORT   PREOPERATIVE DIAGNOSES:  1. Probable cholelithiasis.  2. Rule out porcelain gallbladder.   POSTOPERATIVE DIAGNOSIS:  Cholelithiasis   PROCEDURE:  Laparoscopic cholecystectomy.   SURGEON:  Velora Heckler, MD, FACS   ANESTHESIA:  General.   ESTIMATED BLOOD LOSS:  Minimal.   PREPARATION:  Betadine.   COMPLICATIONS:  None.   INDICATIONS:  The patient is a 77 year old white female well-known to my  practice.  The patient had undergone a partial nephrectomy for renal  cell carcinoma.  Follow-up CT scan by Dr. Crecencio Mc at the [facility] showed increased density in the gallbladder wall  suspicious for porcelain gallbladder.  The patient was seen and  evaluated at my office.  Ultrasound was performed, which could not  differentiate between gallstones versus calcification in the gallbladder  wall.  Therefore, cholecystectomy was recommended.  The patient now  comes to surgery for cholecystectomy.   BODY OF REPORT:  The procedure is done in OR #1 at the [facility].  The patient is brought to the operating room,  placed in a supine position on the operating room table.  Following  administration of general anesthesia, the patient is prepped and draped  in the usual strict aseptic fashion.  After ascertaining that an  adequate level of anesthesia had been obtained, an infraumbilical  incision was made with a #15 blade.  Dissection is carried through  subcutaneous tissues.  Fascia is incised in the midline and the  peritoneal cavity is entered cautiously.  A 0 Vicryl pursestring suture  is placed  in the fascia.  A Hasson cannula is introduced and secured  with a pursestring suture.  The abdomen is insufflated with carbon  dioxide.  Laparoscope is introduced and the abdomen explored.  Operative  ports are placed along the right costal margin in the midline,  midclavicular line, and the anterior axillary line.  Adhesions to the  anterior abdominal wall and the right upper quadrant and right mid  abdomen are taken down with sharp dissection with the Endoshears using  the electrocautery for hemostasis.  Next the gallbladder is grasped and  retracted cephalad.  It is largely intrahepatic.  Upon grasping the  gallbladder, it appears to be completely filled by a large gallstone or  multiple large gallstones, which occupy a large portion of the lumen of  the gallbladder.  The gallbladder is grasped and retracted cephalad.  Using the [facility] with careful dissection, the neck of the  gallbladder is dissected out.  The cystic artery is doubly clipped and  divided.  The cystic duct is dissected out, doubly clipped, and divided.  Posterior branch of the cystic duct is  doubly clipped and divided.  Using the hook electrocautery, the gallbladder is excised from the  gallbladder bed.  Dissection is carried up to the fundus of the  gallbladder, where the gallbladder is excised off the undersurface of  the liver and placed into an EndoCatch bag.  It is withdrawn through the  umbilical port without difficulty.  The 0 Vicryl pursestring suture is  tied securely.  Right upper quadrant is copiously irrigated with warm  saline.  Gallbladder bed is inspected for hemostasis.  Fluid is  evacuated.  Ports are removed under direct vision and pneumoperitoneum  released.  Good hemostasis is noted at all port sites.  Port sites are  anesthetized with local anesthetic.  Wounds are closed with interrupted  4-0 Vicryl and 4-0 Monocryl subcuticular sutures.  The wounds are washed  and dried and Benzoin and  Steri-Strips are applied.  Sterile dressings  are applied.  The patient is awakened from anesthesia and brought to the  recovery room in stable condition.  The patient tolerated the procedure  well.      Velora Heckler, MD  Electronically Signed     TMG/MEDQ  D:  09/08/2007  T:  09/09/2007  Job:  [id number]   cc:   Heloise Purpura, MD  Fax: [phone]   Theressa Millard, M.D.  Fax: [phone]

## 2011-04-03 NOTE — Discharge Summary (Signed)
NAMEMARILIN, [name] NO.:  [id number]   MEDICAL RECORD NO.:  [id number]          PATIENT TYPE:  INP   LOCATION:  1412                         FACILITY:  [facility]   PHYSICIAN:  Heloise Purpura, MD      DATE OF BIRTH:  [birth year]   DATE OF ADMISSION:  07/05/2006  DATE OF DISCHARGE:  07/08/2006                                 DISCHARGE SUMMARY   ADMISSION DIAGNOSIS:  Right renal mass.   DISCHARGE DIAGNOSIS:  Renal cell carcinoma.   PROCEDURES:  Right robotic-assisted laparoscopic partial nephrectomy.   HISTORY AND PHYSICAL:  For full details please see admission history and  physical.  Briefly, [name] is a 77 year old female who was found to have  an incidentally detected enhancing right renal mass worrisome for a possible  renal malignancy.  She underwent a metastatic evaluation which was negative.  Due to her comorbidities of diabetes and hypertension as well as size and  location of the renal mass, she was counseled regarding management options  and did elect to proceed with a nephron-sparing surgical approach.   HOSPITAL COURSE:  On July 05, 2006, the patient was taken to the operating  room and underwent the above procedure.  She tolerated this well without  complications.  Postoperatively, she was able to be transferred to a regular  hospital room following recovery from anesthesia.  She was maintained on bed  rest initially for 24 hours.  On postoperative day #1, she was able to begin  a clear liquid diet which she tolerated without difficulty.  On  postoperative day #2, she did have return of bowel function.  Her diet was  advanced until she was tolerating a solid diet.  She was gradually able to  increase her ambulation beginning on the afternoon of postoperative day #1.  She had minimal output from her retroperitoneal drain following removal of  her Foley catheter.  This was checked for a creatinine level was found to be  consistent with serum.  It  was therefore removed on postoperative day #3.  She was subsequently able to be discharged home in excellent condition.   DISPOSITION:  Home.   DISCHARGE MEDICATIONS:  [name] was instructed to resume her regular home  medications excepting any aspirin, nonsteroidal anti-inflammatory drugs, or  herbal supplements.  She was given a prescription to take Vicodin as needed  for pain and told take Colace as a stool softener.   DISCHARGE INSTRUCTIONS:  [name] was instructed to resume her diet as  before surgery.  She was instructed to be ambulatory but specifically told  to refrain from any heavy lifting or strenuous activity or driving.  She was  instructed on signs and symptoms of wound infection and told to call should  she have any problems.   FOLLOWUP:  [name] will follow-up for removal of her staples as well as  to review her surgical pathology in detail in approximately 7-10 days.           ______________________________  Heloise Purpura, MD  Electronically Signed     LB/MEDQ  D:  07/13/2006  T:  07/13/2006  Job:  [id number]

## 2011-04-03 NOTE — H&P (Signed)
NAMERAYLI, WIEDERHOLD NO.:  [id number]   MEDICAL RECORD NO.:  [id number]           PATIENT TYPE:   LOCATION:                                 FACILITY:   PHYSICIAN:  Heloise Purpura, MD           DATE OF BIRTH:   DATE OF ADMISSION:  07/05/2006  DATE OF DISCHARGE:                                HISTORY & PHYSICAL   CHIEF COMPLAINT:  Right renal mass.   HISTORY:  Ms. Lindstrom is a 77 year old female who was incidentally found to  have an enhancing right renal mass worrisome for possible renal malignancy.  She was having right-sided abdominal pain which prompted her initial CT  scan.  She was noted to have tiny bilateral renal nodules.  These have  subsequently been evaluated with both a biochemical evaluation as well as an  MRI study and do not appear to be functional or concerning for malignancy  based on her evaluation.  In addition, she has undergone a complete  metastatic evaluation.  She was found to have some small bilateral pulmonary  nodules.  However, these appear to be more consistent with granulomatous  disease and do not necessarily appear to represent metastatic cancer.  After  discussing management options, the patient did elect to proceed with  surgical removal.  Due to her comorbidities of diabetes and hypertension as  well as the size and location of the renal mass, she did wish to undergo  nephron-sparing surgery.  Various surgical approaches were discussed with  the patient and she elected to proceed with a laparoscopic partial  nephrectomy possibly with robotic assistance.   PAST MEDICAL HISTORY:  1. Diabetes.  2. Hypertension.  3. Asthma.  4. Glaucoma.  5. Overactive bladder.  6. Gastroesophageal reflux disease.   PAST SURGICAL HISTORY:  1. D&C.  2. Breast lumpectomy for a benign mass.   MEDICATIONS:  Calan SR, Prinivil, oxybutynin, Uniphyl, Premarin,  medroxyprogesterone, Xalatan, Advair, flurbiprofen, Prevacid.   ALLERGIES:  No known  drug allergies.  The patient does have an intolerance  to DARVOCET which causes nausea.   FAMILY HISTORY:  1. Hypertension.  2. Cerebrovascular accident.  3. Pancreatic cancer.  4. Diabetes.  5. Coronary artery disease.   SOCIAL HISTORY:  The patient is retired.  She is married.  She did smoke and  quit in 1987.  She denies alcohol use.   PHYSICAL EXAMINATION:  CONSTITUTIONAL:  Patient is a well-nourished, well-  developed, age-appropriate female in no acute distress.  CARDIOVASCULAR:  Regular rate and rhythm without obvious murmurs.  LUNGS:  Clear bilaterally.  ABDOMEN:  Mildly obese, soft, nondistended, nontender without abdominal  masses or bruits.  BACK:  No CVA tenderness.  EXTREMITIES:  No edema.   IMPRESSION:  Right renal mass worrisome for malignancy.   PLAN:  Ms. Hajduk has undergone a preoperative cardiac evaluation and was  cleared for surgery.  She will plan to undergo a laparoscopic partial  nephrectomy possibly with robotic assistance and then be admitted to the  hospital for routine postoperative care.  ______________________________  Heloise Purpura, MD  Electronically Signed     LB/MEDQ  D:  07/05/2006  T:  07/05/2006  Job:  [id number]

## 2011-04-03 NOTE — Discharge Summary (Signed)
NAMEPAIJE, GOODHART NO.:  [id number]  MEDICAL RECORD NO.:  [id number]           PATIENT TYPE:  I  LOCATION:  1529                         FACILITY:  [facility]  PHYSICIAN:  Theressa Millard, M.D.    DATE OF BIRTH:  Jun 28, 1933  DATE OF ADMISSION:  03/27/2011 DATE OF DISCHARGE:  03/30/2011                              DISCHARGE SUMMARY   ADMITTING DIAGNOSIS:  Abdominal pain and constipation.  DISCHARGE DIAGNOSES: 1. Severe constipation. 2. Possible area of colitis on mid colon.  No evidence of colitis by     history (no blood in the stool or diarrhea). 3. Diabetes mellitus. 4. Hypertension. 5. Hypercholesterolemia. 6. History of renal cell cancer. 7. Gastroesophageal reflux disease and history of esophageal ulcers. 8. Hypothyroidism. 9. Overactive bladder. 10.Chronic obstructive pulmonary disease.  HISTORY OF PRESENT ILLNESS:  The patient is a 77 year old white female who has had problems with constipation in the past.  She has chronically been on Vesicare for overactive bladder.  She developed increasing problems with constipation and some abdominal discomfort and one week ago discontinued Vesicare.  However, she continued to have increasing abdominal pain.  She called Dr. Ewing Schlein on Mar 25, 2011, who obtained an abdominal x-ray which showed a moderate amount of stool.  She was instructed to take MiraLax hourly.  She continued to have increasing pain and called back on May 11 and was directed to the Emergency Department.  HOSPITAL COURSE:  The patient was admitted after it was found that she had severe constipation on a CT scan and she was in a fair amount of pain.  There was an area of possible colitis described as some colonic wall thickening with pericolonic inflammation in the distal transverse colon near the splenic flexure.  There was not evidence of obstruction.  After admission, the patient was given several enemas and MiraLax and other laxatives.  The  result was a number of bowel movements and, in fact, she became incontinent of liquid stool.  She was also given Zosyn because of the area of colitis, although she did not really manifest many symptoms of that.  After she was able to have several bowel movements, she was discharged in improved condition.  She will continue on MiraLax once daily.  She has an appointment to see me in two days and an appointment to see Dr. Ewing Schlein in three days and we will follow up with possible colonoscopy at that time.  In the meantime she will remain on her usual medications.  DISCHARGE MEDICATIONS: 1. Actos 15 mg once daily. 2. Advair 250/50 b.i.d. 3. Armour Thyroid 60 mg once daily. 4. Kaolin SR 240 mg daily. 5. Colace 100 mg three times a day as needed. 6. Glimepiride 2 mg twice daily. 7. Lisinopril 20 mg daily. 8. Maxair one puff q.6 hours p.r.n. 9. MiraLax one dose every day daily. 10.Premarin 0.625 mg daily. 11.Simvastatin 20 mg daily. 12.Theophylline XR 400 mg once daily. 13.Xalatan 0.005% one drop in both eyes daily.  ACTIVITY:  As tolerated.  DIET:  No added salt.     Theressa Millard, M.D.     JO/MEDQ  D:  03/30/2011  T:  03/30/2011  Job:  [id number]  Electronically Signed by Theressa Millard M.D. on 04/03/2011 02:52:00 PM

## 2011-04-03 NOTE — Op Note (Signed)
Carolyn [name], Carolyn [name]                ACCOUNT NO.:  [id number]   MEDICAL RECORD NO.:  [id number]          PATIENT TYPE:  AMB   LOCATION:  ENDO                         FACILITY:  [facility]   PHYSICIAN:  Petra Kuba, M.D.    DATE OF BIRTH:  22-Jan-1933   DATE OF PROCEDURE:  03/18/2006  DATE OF DISCHARGE:                                 OPERATIVE REPORT   PROCEDURE:  Colonoscopy and biopsy.   INDICATIONS:  Patient with a change in bowel habits well overdue for colonic  screening.  Consent was signed after risks, benefits, methods, and options  were thoroughly discussed multiple times in the past.   MEDICINES USED:  Fentanyl 100 mcg, Versed 5 mg, Phenergan 12.5 mg.   PROCEDURE:  Rectal inspection is pertinent for external hemorrhoids, small,  digital exam was negative.  The video colonoscope was inserted and easily  advanced to the mid ascending colon.  We could see the ileocecal valve in  the distance. With abdominal pressure and rolling her on her back, we were  able to advance to the cecal pole which was identified by the appendiceal  orifice.  In fact, the scope was inserted a short way in the terminal ileum  which was normal.  Photo documentation and biopsies were obtained and put in  the first container.  The scope was slowly withdrawn. No abnormality was  seen on insertion.  The prep was fairly adequate and with about a liter of  washing and suctioning, adequate visualization was obtained.  The prep was  much better than yesterday. On slow withdrawal through the colon, no  abnormalities were seen as we slowly withdrew back to the rectum.  Random  colon biopsies were obtained and put in the second container. Back in the  rectum, a very mild, probably prep induced inflammation was seen and a few  scattered biopsies were obtained and put in the third container.  Anorectal  pull through and retroflexion confirmed some small hemorrhoids.  The scope  was straightened and readvanced a short  ways up the left side of the colon,  air was suctioned, and the scope removed.  The patient tolerated the  procedure well.  There was no obvious immediate complication.   ENDOSCOPIC DIAGNOSES:  1.  Small internal external hemorrhoids.  2.  Minimal rectal probable prep induced inflammation status post biopsy.  3.  Otherwise within normal limits to the terminal ileum status post random      biopsies of both the colon and the terminal ileum.   PLAN:  Await pathology.  When the constipation comes back, which I am  predicting since her change in bowel habits is probably due to overflow  incontinence, treat constipation with MiraLax. Will see her back p.r.n. or  in six weeks to recheck symptoms and set her up for a repeat endoscopy,  please see yesterday endoscopy note for details.           ______________________________  Petra Kuba, M.D.     MEM/MEDQ  D:  03/18/2006  T:  03/18/2006  Job:  [id number]   cc:  Theressa Millard, M.D.  Fax: [phone]

## 2011-04-03 NOTE — Op Note (Signed)
Carolyn [name], Carolyn [name] NO.:  [id number]   MEDICAL RECORD NO.:  [id number]          PATIENT TYPE:  INP   LOCATION:  0008                         FACILITY:  [facility]   PHYSICIAN:  Heloise Purpura, MD      DATE OF BIRTH:  May 17, 1933   DATE OF PROCEDURE:  07/05/2006  DATE OF DISCHARGE:                                 OPERATIVE REPORT   PREOPERATIVE DIAGNOSIS:  Right renal mass.   POSTOPERATIVE DIAGNOSIS:  Right renal mass.   PROCEDURES:  Robotic assisted laparoscopic right partial nephrectomy.   SURGEON:  Dr. Heloise Purpura   ASSISTANT:  Dr. Marcine Matar   ANESTHESIA:  General.   COMPLICATIONS:  None.   ESTIMATED BLOOD LOSS:  100 mL.   INTRAVENOUS FLUIDS:  3300 mL of lactated Ringer's.   SPECIMENS:  1. Right renal tumor.  2. Renal parenchymal margin.  3. Renal parenchymal margin.   DRAINS:  1. #15 Blake retroperitoneal drain.  2. 18-French Foley catheter.   INDICATIONS:  Ms. Hostler is a 77 year old female, who was recently found to  have an incidentally detected right renal mass.  She underwent a metastatic  evaluation which did not demonstrate any obvious metastatic disease.  After  discussing treatment options, the patient elected to proceed with nephron  sparing surgery.  Potential risks and benefits of the above procedures were  discussed with the patient, and she consented.   DESCRIPTION OF PROCEDURE:  The patient was taken to the operating room, and  a general anesthetic was administered.  She was given preoperative  antibiotics, placed in the right modified flank position, and prepped and  draped in the usual sterile fashion.  A preoperative time-out was performed.  Due to the patient's large body habitus, the camera port was placed off the  right of midline.  A 1 cm vertical incision was made in the skin just  superior to the level of the umbilicus and at the edge of the right lateral  rectus muscle.  This was carried down to the subcutaneous  tissues with  electrocautery.  The fascia was then incised and the underlying muscle swept  medially.  This allowed the peritoneal cavity to be entered under direct  vision with sharp dissection.  A 12 mm port was then placed, and a  pneumoperitoneum was established.  The abdomen was then inspected.  There  were noted to be some adhesions in the right lower quadrant between the  colon and the anterior abdominal wall.  An 8 mm robotic port was placed  midway between the camera port and the costal margin just lateral to the  falciform ligament.  Laparoscopic scissors were then placed through this  port and used to take down the previously mentioned adhesions in the right  lower quadrant.  An additional 8 mm port was then placed in the right lower  quadrant just lateral to the chest camera port.  An additional 8 mm robotic  port was placed even further laterally just off the iliac crest.  A 12 mm  assistant port was then placed inferior to the original camera  port  placement.  An additional 5 mm port was placed between the camera port and  the right robotic port and the superior robotic port.  All ports were placed  under direct vision and without difficulty.  The surgical cart was then  docked.  With the cautery scissors, the white line of Toldt was incised  along the length of the ascending colon, allowing the colon to be mobilized  medially in the space between the anterior layer of Gerota's fascia and the  colonic mesentery to be developed.  The ureter and gonadal vein were  identified.  The gonadal vein was left in place medially.  The ureter and  surrounding retroperitoneal fat was lifted anteriorly off the psoas fascia.  Dissection then continued superiorly until the renal hilum was encountered.  There was noted to be a single renal vein as well as a single renal artery  that appeared just inferior and posterior to the renal vein.  The renal  artery was isolated in preparation for renal  hilar clamping.  Gerota's  fascia was incised superiorly in the space between the superior pole of the  kidney, and the adrenal gland was developed.  The renal mass was identified  on the posterior upper aspect of the kidney and was carefully dissected away  from the surrounding perinephric fat.  The remainder of the kidney was  mobilized laterally and posteriorly.  This allowed the kidney to be rotated  on the renal hilum.  Then 12.5 g of IV mannitol was administered.  The renal  parenchyma and renal capsule were then scored around the tumor.  Ten minutes  after IV mannitol had been administered, a laparoscopic bulldog clamp was  placed on the right renal artery.  Using sharp cold dissection, the renal  mass was then excised.  The Endopouch retrieval bag was used to retrieve the  specimen via the 12 mm assistance port.  This was sent to pathology.  There  was a question of whether the deep margin was felt to be grossly extending  to the resection.  Therefore, 2 additional sections of renal parenchyma were  excised from the base of the tumor.  These were both examined with frozen  section analysis and were negative for malignancy.  Attention then turned to  reconstruction of the kidney.  Then 3-0 Vicryl figure-of-eight sutures were  placed to control the bleeding vessels at the base of the tumor resection.  Then 2-0 Vicryl preplaced horizontal mattress sutures were placed.  FloSeal  was placed in the defect after the edges of the defect had been coagulated  with the argon beam coagulator.  A Surgicel bolster was then placed over the  FloSeal and under the preplaced 2-0 Vicryl stitches.  The stitches were then  tied down which resulted in compression of the renal parenchyma.  The  laparoscopic bulldog clamp was then removed.  Total warm ischemia time was  42 minutes.  There appeared to be excellent hemostasis.  Additional FloSeal was placed over the resection site.  The renal hilum as well as  the adrenal  bed was carefully examined, and there appeared to be excellent hemostasis.  The kidney was then placed back in its normal anatomic position.  The  perinephric fat and peritoneum were reapproximated over the kidney.  An  additional 12.5 grams of IV mannitol was administered to the patient.  A #15  Blake drain was then brought through the fourth arm robotic port and  positioned in the retroperitoneum.  It was secured to the skin with a nylon  suture.  The surgical cart was then undocked.  The operative field was then  re-examined with the pneumoperitoneum evacuated, and there continued to be  adequate hemostasis.  A 0 Vicryl stitch was then placed through the  abdominal wall fascia of the assistance 12 mm port with the aid of the  suture passer device.  However, this did not adequately close this port  site.  Therefore, the rectus fascia was identified and closed from the  outside with a 0 Vicryl suture.  The camera port site was also closed with  multiple 0 Vicryl sutures.  The pneumoperitoneum was then evacuated, and all  ports were removed under direct vision.  The patient appeared to tolerate  the procedure well and without complications.  She was able to be extubated  and transferred from the recovery in satisfactory condition.          ______________________________  Heloise Purpura, MD  Electronically Signed    LB/MEDQ  D:  07/05/2006  T:  07/05/2006  Job:  [id number]

## 2011-04-06 NOTE — H&P (Signed)
Carolyn [name], Carolyn [name] NO.:  [id number]  MEDICAL RECORD NO.:  [id number]           PATIENT TYPE:  E  LOCATION:  WLED                         FACILITY:  [facility]  PHYSICIAN:  Zannie Cove, MD     DATE OF BIRTH:  [birth year]  DATE OF ADMISSION:  03/27/2011 DATE OF DISCHARGE:                             HISTORY & PHYSICAL   PRIMARY CARE PHYSICIAN:  Theressa Millard, MD  GASTROENTEROLOGIST:  Petra Kuba, M.D.  CHIEF COMPLAINT:  Abdominal pain and severe constipation.  HISTORY OF PRESENT ILLNESS:  Carolyn [name] is a 77 year old Caucasian female with history of chronic constipation presents to the hospital with the above-mentioned complaint.  The patient reports that she has not had a bowel movement for the last 2 weeks.  She started having intermittent episodes of vomiting over the last week.  In addition, she reports diffuse abdominal pain, initially it was in the lower pelvic area.  Subsequently, it spread to become more diffuse.  Denies any fevers or chills.  Reports feeling cold from time to time over the last week.  Has subsequently tried taking MiraLax from Wednesday with no significant benefit, called Dr. Marlane Hatcher office who recommend getting the x-ray which showed lot of stool burden in the colon and recommend MiraLax.  Unfortunately, the patient has been taking only small amounts of this and has not had a bowel movement since with this also.  She had a CT abdomen and pelvis in the ER, which shows large amount of stool with distends the proximal colon.  There is a small segment of distal transverse colon with wall thickening and mild pericolonic inflammation suggestive of nonspecific colitis.  No bowel obstruction, normal caliber small bowel loops.  The patient had a prior colonoscopy 5 years ago and is reportedly normal.  PAST MEDICAL HISTORY: 1. Type 2 diabetes. 2. Hypothyroidism. 3. History of asthma. 4. Hypertension. 5. History of renal cell carcinoma  status post nephrectomy in 2007. 6. History of cholecystectomy. 7. History of total abdominal hysterectomy and bilateral salpingo-     oophorectomy.  SOCIAL HISTORY:  Lives at home, has a daughter who is OR Engineer, civil (consulting) here at [employer].  No history of alcohol, tobacco, or illicit drug use.  REVIEW OF SYSTEMS:  Negative except per HPI.  PHYSICAL EXAMINATION:  VITAL SIGNS:  Exam temp is 97.8, pulse is 70, blood pressure 144/76, respirations 18, and satting 95% on room air. GENERAL:  He is a obese Caucasian female lying in the stretcher in no acute distress. HEENT:  Pupils are round and reactive to light.  Extraocular movements are intact. NECK:  No JVD or lymphadenopathy. CARDIOVASCULAR:  S1, S2.  Regular rate and rhythm. LUNGS:  Clear to auscultation bilaterally. ABDOMEN:  Soft, obese, nondistended, nontender with hypoactive bowel sounds. EXTREMITIES:  With no edema, clubbing, or cyanosis.  LABORATORY DATA: 1. Her CBC is unremarkable.  BMET also normal except for glucose of     146.  Lipase is normal.  UA is unremarkable as well. 2. KUB showed large stool burden, hyperinflation of the lungs.  CT of     the abdomen and  pelvis shows large amount of stool distending the     proximal colon.  The segment of distal transverse colon/splenic     flexure with wall thickening and mild pericolonic inflammation and     keeping with nonspecific colitis.  There is no bowel obstruction,     normal caliber small bowel loops.  ASSESSMENT:  Carolyn [name] is 77 year old female with, 1. Fecal impaction, severe constipation. 2. Nausea, vomiting, and abdominal pain. 3. Questionable nonspecific segment of colitis in the distal     transverse colon, suspect this may be secondary to distention of     the colon wall. 4. Type 2 diabetes.  PLAN: 1. We will start her on IV fluids and empiric IV Zosyn.  Start her on     clear liquid diet. 2. Start Dulcolax 10 mg b.i.d. and stool softener Colace. 3. We will start  enemas.  If no improvement, we will need GI and/or     surgical evaluation. 4. I will also check her TSH level.  I will continue most of her home     medications.  Further management as condition evolves.     Zannie Cove, MD     PJ/MEDQ  D:  03/27/2011  T:  03/27/2011  Job:  [id number]  cc:   Petra Kuba, M.D. Theressa Millard, M.D.  Electronically Signed by Zannie Cove  on 04/06/2011 08:12:03 PM

## 2011-08-26 LAB — DIFFERENTIAL
Basophils Absolute: 0.1
Basophils Relative: 1
Eosinophils Absolute: 0.8 — ABNORMAL HIGH
Eosinophils Relative: 9 — ABNORMAL HIGH
Lymphocytes Relative: 29
Lymphs Abs: 2.8
Monocytes Absolute: 0.5
Monocytes Relative: 6
Neutro Abs: 5.4
Neutrophils Relative %: 56

## 2011-08-26 LAB — COMPREHENSIVE METABOLIC PANEL
ALT: 13
AST: 13
Albumin: 3.5
Alkaline Phosphatase: 66
BUN: 10
CO2: 30
Calcium: 9.6
Chloride: 104
Creatinine, Ser: 0.69
GFR calc Af Amer: >60
GFR calc non Af Amer: >60
Glucose, Bld: 134 — ABNORMAL HIGH
Potassium: 4
Sodium: 140
Total Bilirubin: 0.9
Total Protein: 7.2

## 2011-08-26 LAB — CBC
HCT: 41.4
Hemoglobin: 14.3
MCHC: 34.4
MCV: 84.2
Platelets: 217
RBC: 4.92
RDW: 13.5
WBC: 9.6

## 2011-08-26 LAB — URINALYSIS, ROUTINE W REFLEX MICROSCOPIC
Bilirubin Urine: NEGATIVE
Glucose, UA: NEGATIVE
Hgb urine dipstick: NEGATIVE
Ketones, ur: NEGATIVE
Nitrite: NEGATIVE
Protein, ur: NEGATIVE
Specific Gravity, Urine: 1.022
Urobilinogen, UA: 1
pH: 6

## 2011-08-26 LAB — PROTIME-INR
INR: 0.9
Prothrombin Time: 12.7

## 2011-10-06 ENCOUNTER — Other Ambulatory Visit: Payer: Self-pay | Admitting: Internal Medicine

## 2011-10-06 DIAGNOSIS — Z1231 Encounter for screening mammogram for malignant neoplasm of breast: Secondary | ICD-10-CM

## 2011-11-30 ENCOUNTER — Encounter (INDEPENDENT_AMBULATORY_CARE_PROVIDER_SITE_OTHER): Payer: Self-pay | Admitting: Surgery

## 2011-12-02 ENCOUNTER — Ambulatory Visit (INDEPENDENT_AMBULATORY_CARE_PROVIDER_SITE_OTHER): Payer: Medicare Other | Admitting: Surgery

## 2011-12-02 ENCOUNTER — Encounter (INDEPENDENT_AMBULATORY_CARE_PROVIDER_SITE_OTHER): Payer: Self-pay | Admitting: Surgery

## 2011-12-02 VITALS — BP 136/70 | HR 61 | Temp 97.0°F | Resp 18 | Ht 66.0 in

## 2011-12-02 DIAGNOSIS — K4090 Unilateral inguinal hernia, without obstruction or gangrene, not specified as recurrent: Secondary | ICD-10-CM | POA: Insufficient documentation

## 2011-12-02 NOTE — Progress Notes (Signed)
Chief Complaint  Patient presents with  . New Evaluation    inguinal hernia - referral from Dr. Theressa Millard   HISTORY: Patient is a 78 year old white female who presents at the request of her primary physician with left inguinal hernia. This has been mildly symptomatic. It has been present for 6-8 months. It has gradually increased in size. It causes her discomfort with prolonged standing and physical activity. She denies any signs or symptoms of intestinal obstruction. She has had no prior hernia repairs.  Past Medical History  Diagnosis Date  . Diabetes mellitus   . Hypertension   . Hypercholesterolemia   . Gallstones   . Glaucoma   . Cataracts, bilateral   . COPD (chronic obstructive pulmonary disease)   . Hypothyroid      Current Outpatient Prescriptions  Medication Sig Dispense Refill  . albuterol (PROVENTIL) (2.5 MG/3ML) 0.083% nebulizer solution Take 2.5 mg by nebulization every 6 (six) hours as needed.      [name] estrogens, conjugated, (PREMARIN) 0.625 MG tablet Take 0.625 mg by mouth daily. Take daily for 21 days then do not take for 7 days.      . Fluticasone-Salmeterol (ADVAIR) 250-50 MCG/DOSE AEPB Inhale 1 puff into the lungs every 12 (twelve) hours.      [name] glimepiride (AMARYL) 2 MG tablet Take 2 mg by mouth daily before breakfast.      . lansoprazole (PREVACID) 15 MG capsule Take 15 mg by mouth daily.      [name] latanoprost (XALATAN) 0.005 % ophthalmic solution 1 drop at bedtime.      [name] lisinopril (PRINIVIL,ZESTRIL) 20 MG tablet Take 20 mg by mouth daily.      [name] oxybutynin (DITROPAN) 5 MG tablet Take 5 mg by mouth 3 (three) times daily.      . pioglitazone (ACTOS) 15 MG tablet Take 15 mg by mouth daily.      . pirbuterol (MAXAIR) 200 MCG/INH inhaler Inhale 2 puffs into the lungs 4 (four) times daily.      . polyethylene glycol (MIRALAX / GLYCOLAX) packet Take 17 g by mouth daily.      . simvastatin (ZOCOR) 20 MG tablet Take 20 mg by mouth every evening.      . theophylline  (THEO-24) 400 MG 24 hr capsule Take 400 mg by mouth daily.      [name] thyroid (ARMOUR) 60 MG tablet Take 60 mg by mouth daily.      . verapamil (CALAN-SR) 240 MG CR tablet Take 240 mg by mouth at bedtime.      . lansoprazole (PREVACID SOLUTAB) 15 MG disintegrating tablet Take 15 mg by mouth daily.         Allergies  Allergen Reactions  . Darvocet (Propoxyphene N-Acetaminophen) Nausea And Vomiting  . Metformin And Related Diarrhea  . Percocet (Oxycodone-Acetaminophen) Nausea And Vomiting     Family History  Problem Relation Age of Onset  . Cancer       History   Social History  . Marital Status: Married    Spouse Name: N/A    Number of Children: N/A  . Years of Education: N/A   Social History Main Topics  . Smoking status: Former Games developer  . Smokeless tobacco: None   Comment: quit maybe 75yrs  . Alcohol Use: No  . Drug Use: None  . Sexually Active: None   Other Topics Concern  . None   Social History Narrative  . None     REVIEW OF SYSTEMS - PERTINENT POSITIVES  ONLY: Discomfort with physical activity. No signs or symptoms of intestinal obstruction. Hernia has always been reducible.  EXAM: Filed Vitals:   12/02/11 1052  BP: 136/70  Pulse: 61  Temp: 97 F (36.1 C)  Resp: 18    HEENT: normocephalic; pupils equal and reactive; sclerae clear; dentition good; mucous membranes moist NECK:  symmetric on extension; no palpable anterior or posterior cervical lymphadenopathy; no supraclavicular masses; no tenderness CHEST: clear to auscultation bilaterally without rales, rhonchi, or wheezes CARDIAC: regular rate and rhythm without significant murmur; peripheral pulses are full ABDOMEN: soft without distension; bowel sounds present; no mass; no hepatosplenomegaly; no hernia GU:  Bulge in left groin augments with cough and Valsalva; reducible; mildly tender EXT:  non-tender without edema; no deformity NEURO: no gross focal deficits; no sign of tremor   LABORATORY  RESULTS: See [facility] (CHL[facility]Epic) for most recent results   RADIOLOGY RESULTS: See [facility] (CHL[facility]pic) for most recent results   IMPRESSION: Left inguinal hernia, reducible, symptomatic  PLAN: I discussed with the patient and her family the indications for repair. Hi provided them with literature regarding hernia repair. I explained that we would use a sheet of prosthetic mesh to reinforce the left groin. I explained that there is approximately a 2% recurrence rate. We discussed the hospital stay to be anticipated in the postoperative recovery period we discussed restrictions on her physical activities after surgery. They understand and wish to proceed.  The risks and benefits of the procedure have been discussed at length with the patient.  The patient understands the proposed procedure, potential alternative treatments, and the course of recovery to be expected.  All of the patient's questions have been answered at this time.  The patient wishes to proceed with surgery and will schedule a date for their procedure through our office staff.  Velora Heckler, MD, FACS General & Endocrine Surgery [facility]   Visit Diagnoses: 1. Inguinal hernia unilateral, non-recurrent, left     Primary Care Physician: Darnelle Bos, MD, MD

## 2011-12-02 NOTE — Patient Instructions (Signed)

## 2011-12-16 ENCOUNTER — Ambulatory Visit
Admission: RE | Admit: 2011-12-16 | Discharge: 2011-12-16 | Disposition: A | Discharge status: Home / Self Care | Payer: Medicare Other | Source: Ambulatory Visit | Attending: Internal Medicine | Admitting: Internal Medicine

## 2011-12-16 DIAGNOSIS — Z1231 Encounter for screening mammogram for malignant neoplasm of breast: Secondary | ICD-10-CM

## 2012-01-08 ENCOUNTER — Encounter (HOSPITAL_COMMUNITY): Payer: Self-pay | Admitting: Pharmacy Technician

## 2012-01-12 ENCOUNTER — Encounter (HOSPITAL_COMMUNITY)
Admission: RE | Admit: 2012-01-12 | Discharge: 2012-01-12 | Disposition: A | Discharge status: Home / Self Care | Payer: Medicare Other | Source: Ambulatory Visit | Attending: Surgery | Admitting: Surgery

## 2012-01-12 ENCOUNTER — Other Ambulatory Visit: Payer: Self-pay

## 2012-01-12 ENCOUNTER — Encounter (HOSPITAL_COMMUNITY): Payer: Self-pay

## 2012-01-12 HISTORY — DX: Other specified postprocedural states: R11.2

## 2012-01-12 HISTORY — DX: Other specified postprocedural states: Z98.890

## 2012-01-12 HISTORY — DX: Chronic kidney disease, unspecified: N18.9

## 2012-01-12 LAB — CBC
HCT: 42.1 % (ref 36.0–46.0)
Hemoglobin: 13.8 g/dL (ref 12.0–15.0)
MCH: 29 pg (ref 26.0–34.0)
MCHC: 32.8 g/dL (ref 30.0–36.0)
MCV: 88.4 fL (ref 78.0–100.0)
Platelets: 202 10*3/uL (ref 150–400)
RBC: 4.76 MIL/uL (ref 3.87–5.11)
RDW: 14.5 % (ref 11.5–15.5)
WBC: 9.3 10*3/uL (ref 4.0–10.5)

## 2012-01-12 LAB — BASIC METABOLIC PANEL
BUN: 17 mg/dL (ref 6–23)
CO2: 29 mEq/L (ref 19–32)
Calcium: 10.4 mg/dL (ref 8.4–10.5)
Chloride: 102 mEq/L (ref 96–112)
Creatinine, Ser: 0.8 mg/dL (ref 0.50–1.10)
GFR calc Af Amer: 81 mL/min — ABNORMAL LOW (ref >90)
GFR calc non Af Amer: 70 mL/min — ABNORMAL LOW (ref >90)
Glucose, Bld: 105 mg/dL — ABNORMAL HIGH (ref 70–99)
Potassium: 4.4 mEq/L (ref 3.5–5.1)
Sodium: 138 mEq/L (ref 135–145)

## 2012-01-12 LAB — SURGICAL PCR SCREEN
MRSA, PCR: NEGATIVE
Staphylococcus aureus: NEGATIVE

## 2012-01-12 NOTE — Patient Instructions (Signed)
or20 Carolyn [name]  01/12/2012   Your procedure is scheduled on:  Tuesday 01/19/2012 at 0730am  Report to [facility] at 0530 AM.  Call this number if you have problems the morning of surgery: [phone]   Remember:   Do not eat food:After Midnight.  May have clear liquids:until Midnight .  Clear liquids include soda, tea, black coffee, apple or grape juice, broth.  Take these medicines the morning of surgery with A SIP OF WATER: use Advair inhaler, Maxair inhaler as needed and bring them with you morning of surgery   Do not wear jewelry, make-up or nail polish.  Do not wear lotions, powders, or perfumes.   Do not shave 48 hours prior to surgery.(women only-shaving legs)  Do not bring valuables to the hospital.  Contacts, dentures or bridgework may not be worn into surgery.  Leave suitcase in the car. After surgery it may be brought to your room.  For patients admitted to the hospital, checkout time is 11:00 AM the day of discharge.   Patients discharged the day of surgery will not be allowed to drive home.  Name and phone number of your driver:   Special Instructions: CHG Shower Use Special Wash: 1/2 bottle night before surgery and 1/2 bottle morning of surgery.   Please read over the following fact sheets that you were given: MRSA Information

## 2012-01-12 NOTE — Pre-Procedure Instructions (Signed)
Talked to Charise Carwin ,clinic staff at Dr. Ardine Eng office to inform her he needs to review BMET results.

## 2012-01-19 ENCOUNTER — Encounter (HOSPITAL_COMMUNITY)
Admission: RE | Disposition: A | Discharge status: Home / Self Care | Payer: Self-pay | Source: Ambulatory Visit | Attending: Surgery

## 2012-01-19 ENCOUNTER — Encounter (HOSPITAL_COMMUNITY): Payer: Self-pay | Admitting: Anesthesiology

## 2012-01-19 ENCOUNTER — Encounter (HOSPITAL_COMMUNITY): Payer: Self-pay | Admitting: *Deleted

## 2012-01-19 ENCOUNTER — Ambulatory Visit (HOSPITAL_COMMUNITY): Payer: Medicare Other | Admitting: Anesthesiology

## 2012-01-19 ENCOUNTER — Ambulatory Visit (HOSPITAL_COMMUNITY)
Admission: RE | Admit: 2012-01-19 | Discharge: 2012-01-19 | Disposition: A | Discharge status: Home / Self Care | Payer: Medicare Other | Source: Ambulatory Visit | Attending: Surgery | Admitting: Surgery

## 2012-01-19 DIAGNOSIS — Z90710 Acquired absence of both cervix and uterus: Secondary | ICD-10-CM | POA: Insufficient documentation

## 2012-01-19 DIAGNOSIS — E119 Type 2 diabetes mellitus without complications: Secondary | ICD-10-CM | POA: Insufficient documentation

## 2012-01-19 DIAGNOSIS — Z01812 Encounter for preprocedural laboratory examination: Secondary | ICD-10-CM | POA: Insufficient documentation

## 2012-01-19 DIAGNOSIS — K4090 Unilateral inguinal hernia, without obstruction or gangrene, not specified as recurrent: Secondary | ICD-10-CM

## 2012-01-19 DIAGNOSIS — Z79899 Other long term (current) drug therapy: Secondary | ICD-10-CM | POA: Insufficient documentation

## 2012-01-19 DIAGNOSIS — N189 Chronic kidney disease, unspecified: Secondary | ICD-10-CM | POA: Insufficient documentation

## 2012-01-19 DIAGNOSIS — J4489 Other specified chronic obstructive pulmonary disease: Secondary | ICD-10-CM | POA: Insufficient documentation

## 2012-01-19 DIAGNOSIS — Z9079 Acquired absence of other genital organ(s): Secondary | ICD-10-CM | POA: Insufficient documentation

## 2012-01-19 DIAGNOSIS — E039 Hypothyroidism, unspecified: Secondary | ICD-10-CM | POA: Insufficient documentation

## 2012-01-19 DIAGNOSIS — C649 Malignant neoplasm of unspecified kidney, except renal pelvis: Secondary | ICD-10-CM | POA: Insufficient documentation

## 2012-01-19 DIAGNOSIS — E7800 Pure hypercholesterolemia, unspecified: Secondary | ICD-10-CM | POA: Insufficient documentation

## 2012-01-19 DIAGNOSIS — J449 Chronic obstructive pulmonary disease, unspecified: Secondary | ICD-10-CM | POA: Insufficient documentation

## 2012-01-19 DIAGNOSIS — Z905 Acquired absence of kidney: Secondary | ICD-10-CM | POA: Insufficient documentation

## 2012-01-19 DIAGNOSIS — I129 Hypertensive chronic kidney disease with stage 1 through stage 4 chronic kidney disease, or unspecified chronic kidney disease: Secondary | ICD-10-CM | POA: Insufficient documentation

## 2012-01-19 HISTORY — PX: INGUINAL HERNIA REPAIR: SHX194

## 2012-01-19 HISTORY — PX: HERNIA REPAIR: SHX51

## 2012-01-19 LAB — GLUCOSE, CAPILLARY
Glucose-Capillary: 115 mg/dL — ABNORMAL HIGH (ref 70–99)
Glucose-Capillary: 102 mg/dL — ABNORMAL HIGH (ref 70–99)

## 2012-01-19 SURGERY — REPAIR, HERNIA, INGUINAL, ADULT
Anesthesia: General | Laterality: Left | Wound class: Clean

## 2012-01-19 MED ORDER — NEOSTIGMINE METHYLSULFATE 1 MG/ML IJ SOLN
INTRAMUSCULAR | Status: DC | PRN
  Administered 2012-01-19: 4 mg via INTRAVENOUS

## 2012-01-19 MED ORDER — HYDROMORPHONE HCL PF 1 MG/ML IJ SOLN: 1.0000 mg | INTRAMUSCULAR | Status: DC | PRN

## 2012-01-19 MED ORDER — HYDROCODONE-ACETAMINOPHEN 5-325 MG PO TABS: 1.0000 | ORAL_TABLET | ORAL | Status: DC | PRN

## 2012-01-19 MED ORDER — ALBUTEROL SULFATE HFA 108 (90 BASE) MCG/ACT IN AERS
2.0000 | INHALATION_SPRAY | RESPIRATORY_TRACT | Status: DC
  Filled 2012-01-19: qty 6.7

## 2012-01-19 MED ORDER — PANTOPRAZOLE SODIUM 20 MG PO TBEC
20.0000 mg | DELAYED_RELEASE_TABLET | Freq: Every day | ORAL | Status: DC
  Administered 2012-01-19: 20 mg via ORAL
  Filled 2012-01-19 (×2): qty 1

## 2012-01-19 MED ORDER — LACTATED RINGERS IV SOLN: INTRAVENOUS | Status: DC

## 2012-01-19 MED ORDER — ESTROGENS CONJUGATED 0.625 MG PO TABS
0.6250 mg | ORAL_TABLET | Freq: Every day | ORAL | Status: DC
  Filled 2012-01-19 (×2): qty 1

## 2012-01-19 MED ORDER — DARIFENACIN HYDROBROMIDE ER 7.5 MG PO TB24
7.5000 mg | ORAL_TABLET | Freq: Every day | ORAL | Status: DC
  Administered 2012-01-19: 7.5 mg via ORAL
  Filled 2012-01-19 (×2): qty 1

## 2012-01-19 MED ORDER — ACETAMINOPHEN 325 MG PO TABS: 650.0000 mg | ORAL_TABLET | ORAL | Status: DC | PRN

## 2012-01-19 MED ORDER — PROMETHAZINE HCL 25 MG/ML IJ SOLN: 6.2500 mg | INTRAMUSCULAR | Status: DC | PRN

## 2012-01-19 MED ORDER — POLYETHYLENE GLYCOL 3350 17 G PO PACK
17.0000 g | PACK | Freq: Every day | ORAL | Status: DC | PRN
  Filled 2012-01-19: qty 1

## 2012-01-19 MED ORDER — PIOGLITAZONE HCL 15 MG PO TABS
15.0000 mg | ORAL_TABLET | Freq: Every day | ORAL | Status: DC
  Filled 2012-01-19 (×2): qty 1

## 2012-01-19 MED ORDER — GLYCOPYRROLATE 0.2 MG/ML IJ SOLN
INTRAMUSCULAR | Status: DC | PRN
  Administered 2012-01-19: .7 mg via INTRAVENOUS

## 2012-01-19 MED ORDER — 0.9 % SODIUM CHLORIDE (POUR BTL) OPTIME
TOPICAL | Status: DC | PRN
  Administered 2012-01-19: 1000 mL

## 2012-01-19 MED ORDER — LISINOPRIL 20 MG PO TABS
20.0000 mg | ORAL_TABLET | Freq: Every day | ORAL | Status: DC
  Filled 2012-01-19 (×2): qty 1

## 2012-01-19 MED ORDER — LATANOPROST 0.005 % OP SOLN
1.0000 [drp] | Freq: Every day | OPHTHALMIC | Status: DC
  Filled 2012-01-19: qty 2.5

## 2012-01-19 MED ORDER — ONDANSETRON HCL 4 MG PO TABS: 4.0000 mg | ORAL_TABLET | Freq: Four times a day (QID) | ORAL | Status: DC | PRN

## 2012-01-19 MED ORDER — THEOPHYLLINE ER 400 MG PO CP24: 400.0000 mg | ORAL_CAPSULE | Freq: Every day | ORAL | Status: DC

## 2012-01-19 MED ORDER — CEFAZOLIN SODIUM-DEXTROSE 2-3 GM-% IV SOLR
2.0000 g | INTRAVENOUS | Status: AC
  Administered 2012-01-19: 2 g via INTRAVENOUS

## 2012-01-19 MED ORDER — HYDROMORPHONE HCL PF 1 MG/ML IJ SOLN: 0.2500 mg | INTRAMUSCULAR | Status: DC | PRN

## 2012-01-19 MED ORDER — FENTANYL CITRATE 0.05 MG/ML IJ SOLN
INTRAMUSCULAR | Status: DC | PRN
  Administered 2012-01-19: 50 ug via INTRAVENOUS
  Administered 2012-01-19: 100 ug via INTRAVENOUS
  Administered 2012-01-19 (×2): 50 ug via INTRAVENOUS

## 2012-01-19 MED ORDER — BUPIVACAINE LIPOSOME 1.3 % IJ SUSP
20.0000 mL | Freq: Once | INTRAMUSCULAR | Status: AC
  Administered 2012-01-19: 20 mL
  Filled 2012-01-19: qty 20

## 2012-01-19 MED ORDER — THYROID 60 MG PO TABS
60.0000 mg | ORAL_TABLET | Freq: Every day | ORAL | Status: DC
  Filled 2012-01-19 (×2): qty 1

## 2012-01-19 MED ORDER — KCL IN DEXTROSE-NACL 20-5-0.45 MEQ/L-%-% IV SOLN
INTRAVENOUS | Status: DC
  Administered 2012-01-19: 10:00:00 via INTRAVENOUS
  Filled 2012-01-19 (×2): qty 1000

## 2012-01-19 MED ORDER — ROCURONIUM BROMIDE 100 MG/10ML IV SOLN
INTRAVENOUS | Status: DC | PRN
  Administered 2012-01-19: 30 mg via INTRAVENOUS

## 2012-01-19 MED ORDER — FLUTICASONE-SALMETEROL 250-50 MCG/DOSE IN AEPB
1.0000 | INHALATION_SPRAY | Freq: Two times a day (BID) | RESPIRATORY_TRACT | Status: DC | PRN
  Filled 2012-01-19: qty 14

## 2012-01-19 MED ORDER — GLIMEPIRIDE 2 MG PO TABS
2.0000 mg | ORAL_TABLET | Freq: Every day | ORAL | Status: DC
  Filled 2012-01-19 (×2): qty 1

## 2012-01-19 MED ORDER — ONDANSETRON HCL 4 MG/2ML IJ SOLN: 4.0000 mg | Freq: Four times a day (QID) | INTRAMUSCULAR | Status: DC | PRN

## 2012-01-19 MED ORDER — MEPERIDINE HCL 50 MG/ML IJ SOLN: 6.2500 mg | INTRAMUSCULAR | Status: DC | PRN

## 2012-01-19 MED ORDER — LACTATED RINGERS IV SOLN
INTRAVENOUS | Status: DC | PRN
  Administered 2012-01-19: 07:00:00 via INTRAVENOUS

## 2012-01-19 MED ORDER — THEOPHYLLINE ER 400 MG PO TB24
400.0000 mg | ORAL_TABLET | Freq: Every day | ORAL | Status: DC
  Filled 2012-01-19 (×2): qty 1

## 2012-01-19 MED ORDER — ACETAMINOPHEN 10 MG/ML IV SOLN
INTRAVENOUS | Status: DC | PRN
  Administered 2012-01-19: 1000 mg via INTRAVENOUS

## 2012-01-19 MED ORDER — LIDOCAINE HCL (CARDIAC) 20 MG/ML IV SOLN
INTRAVENOUS | Status: DC | PRN
  Administered 2012-01-19: 50 mg via INTRAVENOUS

## 2012-01-19 MED ORDER — SIMVASTATIN 20 MG PO TABS
20.0000 mg | ORAL_TABLET | Freq: Every evening | ORAL | Status: DC
Start: 2012-01-19 — End: 2012-01-19
  Filled 2012-01-19 (×2): qty 1

## 2012-01-19 MED ORDER — DEXAMETHASONE SODIUM PHOSPHATE 10 MG/ML IJ SOLN
INTRAMUSCULAR | Status: DC | PRN
  Administered 2012-01-19: 10 mg via INTRAVENOUS

## 2012-01-19 MED ORDER — SUCCINYLCHOLINE CHLORIDE 20 MG/ML IJ SOLN
INTRAMUSCULAR | Status: DC | PRN
  Administered 2012-01-19: 100 mg via INTRAVENOUS

## 2012-01-19 MED ORDER — PROPOFOL 10 MG/ML IV BOLUS
INTRAVENOUS | Status: DC | PRN
  Administered 2012-01-19: 150 mg via INTRAVENOUS

## 2012-01-19 MED ORDER — VERAPAMIL HCL ER 240 MG PO TBCR
240.0000 mg | EXTENDED_RELEASE_TABLET | Freq: Every day | ORAL | Status: DC
  Filled 2012-01-19 (×2): qty 1

## 2012-01-19 SURGICAL SUPPLY — 39 items
APL SKNCLS STERI-STRIP NONHPOA (GAUZE/BANDAGES/DRESSINGS) ×1
APPLICATOR COTTON TIP 6IN STRL (MISCELLANEOUS) ×1 IMPLANT
BENZOIN TINCTURE PRP APPL 2/3 (GAUZE/BANDAGES/DRESSINGS) ×2 IMPLANT
BLADE HEX COATED 2.75 (ELECTRODE) ×2 IMPLANT
BLADE SURG 15 STRL LF DISP TIS (BLADE) ×1 IMPLANT
BLADE SURG 15 STRL SS (BLADE) ×2
BLADE SURG SZ10 CARB STEEL (BLADE) IMPLANT
CANISTER SUCTION 2500CC (MISCELLANEOUS) ×2 IMPLANT
CLOSURE STERI STRIP 1/2 X4 (GAUZE/BANDAGES/DRESSINGS) ×1 IMPLANT
CLOTH BEACON ORANGE TIMEOUT ST (SAFETY) ×2 IMPLANT
DECANTER SPIKE VIAL GLASS SM (MISCELLANEOUS) ×1 IMPLANT
DRAIN PENROSE 18X1/2 LTX STRL (DRAIN) ×1 IMPLANT
DRAPE LAPAROTOMY TRNSV 102X78 (DRAPE) ×2 IMPLANT
ELECT REM PT RETURN 9FT ADLT (ELECTROSURGICAL) ×2
ELECTRODE REM PT RTRN 9FT ADLT (ELECTROSURGICAL) ×1 IMPLANT
GLOVE BIOGEL PI IND STRL 7.0 (GLOVE) ×1 IMPLANT
GLOVE BIOGEL PI INDICATOR 7.0 (GLOVE)
GLOVE SURG ORTHO 8.0 STRL STRW (GLOVE) ×2 IMPLANT
GOWN STRL NON-REIN LRG LVL3 (GOWN DISPOSABLE) ×2 IMPLANT
GOWN STRL REIN XL XLG (GOWN DISPOSABLE) ×4 IMPLANT
KIT BASIN OR (CUSTOM PROCEDURE TRAY) ×2 IMPLANT
MESH ULTRAPRO 3X6 7.6X15CM (Mesh General) ×1 IMPLANT
NDL HYPO 25X1 1.5 SAFETY (NEEDLE) ×1 IMPLANT
NEEDLE HYPO 25X1 1.5 SAFETY (NEEDLE) ×2 IMPLANT
NS IRRIG 1000ML POUR BTL (IV SOLUTION) ×2 IMPLANT
PACK BASIC VI WITH GOWN DISP (CUSTOM PROCEDURE TRAY) ×2 IMPLANT
PENCIL BUTTON HOLSTER BLD 10FT (ELECTRODE) ×2 IMPLANT
SPONGE GAUZE 4X4 12PLY (GAUZE/BANDAGES/DRESSINGS) ×1 IMPLANT
SPONGE LAP 4X18 X RAY DECT (DISPOSABLE) ×4 IMPLANT
STRIP CLOSURE SKIN 1/2X4 (GAUZE/BANDAGES/DRESSINGS) ×2 IMPLANT
SUT MNCRL AB 4-0 PS2 18 (SUTURE) ×2 IMPLANT
SUT NOVA NAB GS-22 2 0 T19 (SUTURE) ×4 IMPLANT
SUT SILK 2 0 SH (SUTURE) ×3 IMPLANT
SUT VIC AB 3-0 SH 18 (SUTURE) ×2 IMPLANT
SYR BULB IRRIGATION 50ML (SYRINGE) ×2 IMPLANT
SYR CONTROL 10ML LL (SYRINGE) ×2 IMPLANT
TAPE CLOTH SURG 4X10 WHT LF (GAUZE/BANDAGES/DRESSINGS) ×1 IMPLANT
TOWEL OR 17X26 10 PK STRL BLUE (TOWEL DISPOSABLE) ×2 IMPLANT
YANKAUER SUCT BULB TIP 10FT TU (MISCELLANEOUS) ×2 IMPLANT

## 2012-01-19 NOTE — Anesthesia Preprocedure Evaluation (Addendum)
Anesthesia Evaluation  Patient identified by MRN, date of birth, ID band Patient awake    Reviewed: Allergy & Precautions, H&P , NPO status , Patient's Chart, lab work & pertinent test results  History of Anesthesia Complications (+) PONV  Airway Mallampati: II TM Distance: >3 FB Neck ROM: Full    Dental No notable dental hx.    Pulmonary neg pulmonary ROS, asthma , COPD breath sounds clear to auscultation  Pulmonary exam normal       Cardiovascular hypertension, Pt. on medications + CABG negative cardio ROS  Rhythm:Regular Rate:Normal     Neuro/Psych negative neurological ROS  negative psych ROS   GI/Hepatic negative GI ROS, Neg liver ROS,   Endo/Other  negative endocrine ROSDiabetes mellitus-, Oral Hypoglycemic AgentsHypothyroidism   Renal/GU negative Renal ROS  negative genitourinary   Musculoskeletal negative musculoskeletal ROS (+)   Abdominal   Peds negative pediatric ROS (+)  Hematology negative hematology ROS (+)   Anesthesia Other Findings   Reproductive/Obstetrics negative OB ROS                          Anesthesia Physical Anesthesia Plan  ASA: III  Anesthesia Plan: General   Post-op Pain Management:    Induction: Intravenous  Airway Management Planned: Oral ETT  Additional Equipment:   Intra-op Plan:   Post-operative Plan: Extubation in OR  Informed Consent: I have reviewed the patients History and Physical, chart, labs and discussed the procedure including the risks, benefits and alternatives for the proposed anesthesia with the patient or authorized representative who has indicated his/her understanding and acceptance.   Dental advisory given  Plan Discussed with: CRNA  Anesthesia Plan Comments:        Anesthesia Quick Evaluation

## 2012-01-19 NOTE — Anesthesia Postprocedure Evaluation (Signed)
  Anesthesia Post-op Note  Patient: Carolyn [name]  Procedure(s) Performed: Procedure(s) (LRB): HERNIA REPAIR INGUINAL ADULT (Left) INSERTION OF MESH (Left)  Patient Location: PACU  Anesthesia Type: General  Level of Consciousness: awake and alert   Airway and Oxygen Therapy: Patient Spontanous Breathing  Post-op Pain: mild  Post-op Assessment: Post-op Vital signs reviewed, Patient's Cardiovascular Status Stable, Respiratory Function Stable, Patent Airway and No signs of Nausea or vomiting  Post-op Vital Signs: stable  Complications: No apparent anesthesia complications

## 2012-01-19 NOTE — H&P (Signed)
Carolyn [name] is an 78 y.o. female.   Chief Complaint: Left inguinal hernia HPI: Patient is a 78 year old white female who presents at the request of her primary physician with left inguinal hernia. This has been mildly symptomatic. It has been present for 6-8 months. It has gradually increased in size. It causes her discomfort with prolonged standing and physical activity. She denies any signs or symptoms of intestinal obstruction. She has had no prior hernia repairs.    Past Medical History  Diagnosis Date  . Diabetes mellitus   . Hypertension   . Hypercholesterolemia   . Gallstones   . Glaucoma   . Cataracts, bilateral   . COPD (chronic obstructive pulmonary disease)   . Hypothyroid   . PONV (postoperative nausea and vomiting)   . Asthma   . Chronic kidney disease 2007    kidney cancer and partial nephrctomy-right    Past Surgical History  Procedure Date  . Dilation and curettage of uterus   . Partial nephrectomy     cancer  . Breast biopsy   . D&c for enlarged uterine 210  . Total abdominal hysterectomy w/ bilateral salpingoophorectomy 2011  . Cateract extractions and iol   . Cholecystectomy 2008  . Abdominal hysterectomy   . Eye surgery 08/2011,09/2012    bilateral cataracts    Family History  Problem Relation Age of Onset  . Cancer     Social History:  reports that she quit smoking about 30 years ago. Her smoking use included Cigarettes. She has a 5 pack-year smoking history. She does not have any smokeless tobacco history on file. She reports that she does not drink alcohol or use illicit drugs.  Allergies:  Allergies  Allergen Reactions  . Darvocet (Propoxyphene N-Acetaminophen) Nausea And Vomiting  . Metformin And Related Diarrhea  . Percocet (Oxycodone-Acetaminophen) Nausea And Vomiting    Medications Prior to Admission  Medication Dose Route Frequency Provider Last Rate Last Dose  . bupivacaine liposome (EXPAREL) 1.3 % injection 266 mg  20 mL  Infiltration Once Velora Heckler, MD      . ceFAZolin (ANCEF) IVPB 2 g/50 mL premix  2 g Intravenous 60 min Pre-Op Velora Heckler, MD       Medications Prior to Admission  Medication Sig Dispense Refill  . estrogens, conjugated, (PREMARIN) 0.625 MG tablet Take 0.625 mg by mouth daily.       . Fluticasone-Salmeterol (ADVAIR) 250-50 MCG/DOSE AEPB Inhale 1 puff into the lungs 2 (two) times daily as needed. Wheezing       . glimepiride (AMARYL) 2 MG tablet Take 2 mg by mouth at bedtime.       . lansoprazole (PREVACID) 15 MG capsule Take 15 mg by mouth daily as needed. Heart burn       . latanoprost (XALATAN) 0.005 % ophthalmic solution Place 1 drop into both eyes at bedtime.       [name] lisinopril (PRINIVIL,ZESTRIL) 20 MG tablet Take 20 mg by mouth at bedtime.       . pioglitazone (ACTOS) 15 MG tablet Take 15 mg by mouth at bedtime.       . pirbuterol (MAXAIR) 200 MCG/INH inhaler Inhale 2 puffs into the lungs 4 (four) times daily as needed. Wheezing       . polyethylene glycol (MIRALAX / GLYCOLAX) packet Take 17 g by mouth daily as needed. Constipation       . simvastatin (ZOCOR) 20 MG tablet Take 20 mg by mouth every evening.      [name]  theophylline (THEO-24) 400 MG 24 hr capsule Take 400 mg by mouth at bedtime.       [name] thyroid (ARMOUR) 60 MG tablet Take 60 mg by mouth at bedtime.       . verapamil (CALAN-SR) 240 MG CR tablet Take 240 mg by mouth at bedtime.        Results for orders placed during the hospital encounter of 01/19/12 (from the past 48 hour(s))  GLUCOSE, CAPILLARY     Status: Abnormal   Collection Time   01/19/12  6:33 AM      Component Value Range Comment   Glucose-Capillary 115 (*) 70 - 99 (mg/dL)    No results found.  Review of Systems  Constitutional: Negative.   HENT: Negative.   Eyes: Negative.   Respiratory: Negative.   Cardiovascular: Negative.   Gastrointestinal: Negative.   Genitourinary:       Left inguinal hernia   Musculoskeletal: Positive for joint pain.    Skin: Negative.   Neurological: Negative.   Endo/Heme/Allergies: Negative.   Psychiatric/Behavioral: Negative.     Blood pressure 176/87, pulse 67, temperature 97.3 F (36.3 C), resp. rate 20, last menstrual period 03/24/2010, SpO2 99.00%. Physical Exam  Constitutional: She is oriented to person, place, and time. She appears well-developed and well-nourished.  HENT:  Head: Normocephalic and atraumatic.  Right Ear: External ear normal.  Left Ear: External ear normal.  Nose: Nose normal.  Mouth/Throat: Oropharynx is clear and moist.  Eyes: Conjunctivae and EOM are normal. Pupils are equal, round, and reactive to light.  Neck: Normal range of motion. Neck supple.  Cardiovascular: Normal rate, regular rhythm, normal heart sounds and intact distal pulses.   Respiratory: Effort normal and breath sounds normal.  GI:       Left inguinal hernia, reducible  Musculoskeletal: Normal range of motion.  Neurological: She is alert and oriented to person, place, and time. She has normal reflexes.  Skin: Skin is warm and dry.  Psychiatric: She has a normal mood and affect. Her behavior is normal. Judgment and thought content normal.     Assessment/Plan Left inguinal hernia, reducible  The risks and benefits of the procedure have been discussed at length with the patient.  The patient understands the proposed procedure, potential alternative treatments, and the course of recovery to be expected.  All of the patient's questions have been answered at this time.  The patient wishes to proceed with surgery.  Velora Heckler, MD, [facility] Office: [phone]    Carolyn [name] 01/19/2012, 7:28 AM

## 2012-01-19 NOTE — Brief Op Note (Signed)
01/19/2012  9:01 AM  PATIENT:  Carolyn [name]  78 y.o. female  PRE-OPERATIVE DIAGNOSIS:  left inguinal hernia, reducible  POST-OPERATIVE DIAGNOSIS:  Same  PROCEDURE:  Procedure(s) (LRB): HERNIA REPAIR INGUINAL ADULT (Left) INSERTION OF MESH (Left)  SURGEON:  Surgeon(s) and Role:    * Velora Heckler, MD - Primary  ASSISTANTS: none   ANESTHESIA:   general  EBL:     BLOOD ADMINISTERED:none  DRAINS: none   LOCAL MEDICATIONS USED:  OTHER Exparel 20cc  SPECIMEN:  No Specimen  DISPOSITION OF SPECIMEN:  N/A  COUNTS:  YES  TOURNIQUET:  * No tourniquets in log *  DICTATION: .Other Dictation: Dictation Number [id number]  PLAN OF CARE: Admit for overnight observation  PATIENT DISPOSITION:  PACU - hemodynamically stable.   Delay start of Pharmacological VTE agent (>24hrs) due to surgical blood loss or risk of bleeding: yes  Velora Heckler, MD, [facility] Office: [phone]

## 2012-01-19 NOTE — Transfer of Care (Signed)
Immediate Anesthesia Transfer of Care Note  Patient: Carolyn [name]  Procedure(s) Performed: Procedure(s) (LRB): HERNIA REPAIR INGUINAL ADULT (Left) INSERTION OF MESH (Left)  Patient Location: PACU  Anesthesia Type: General  Level of Consciousness: sedated, patient cooperative and responds to stimulaton  Airway & Oxygen Therapy: Patient Spontanous Breathing and Patient connected to face mask oxgen  Post-op Assessment: Report given to PACU RN and Post -op Vital signs reviewed and stable  Post vital signs: Reviewed and stable  Complications: No apparent anesthesia complications

## 2012-01-19 NOTE — Progress Notes (Signed)
Patient discharged home, all discharge medications and instructions reviewed with daughter present at bedside. Patient ambulated in hallway, voided without difficulty and tolerated a solid food diet prior to discharge.  Patient and daughter voiced understanding of discharge instructions with no questions. Pt to be assisted to vehicle by wheelchair.

## 2012-01-20 NOTE — Op Note (Signed)
NAMEADRIEL, DESROSIER NO.:  [id number]  MEDICAL RECORD NO.:  [id number]  LOCATION:  1319                         FACILITY:  [facility]  PHYSICIAN:  Velora Heckler, MD      DATE OF BIRTH:  04/16/1933  DATE OF PROCEDURE:  01/19/2012 DATE OF DISCHARGE:  01/19/2012                              OPERATIVE REPORT   PREOPERATIVE DIAGNOSIS:  Left inguinal hernia, reducible.  POSTOPERATIVE DIAGNOSIS:  Left inguinal hernia, reducible.  PROCEDURE:  Repair of left inguinal hernia with Ethicon UltraPro mesh.  SURGEON:  Velora Heckler, MD, FACS  ANESTHESIA:  General.  ANESTHESIOLOGIST:  Phillips Grout, M.D.  ESTIMATED BLOOD LOSS:  Minimal.  PREPARATION:  ChloraPrep.  COMPLICATIONS:  None.  INDICATIONS:  The patient is a 78 year old female with a reducible left inguinal hernia.  It has gradually increased in size and causes mild discomfort with physical activity.  She has had no signs or symptoms of obstruction.  She now comes to surgery for repair.  BODY OF REPORT:  Procedure was done in OR #11 at the [facility].  The patient was brought to the operating room, placed in a supine position on the operating room table.  Following administration of general anesthesia, the patient was positioned and then prepped and draped in the usual strict aseptic fashion.  After ascertaining that an adequate level of anesthesia had been achieved, a left groin incision was made with a #15 blade.  Dissection was carried through subcutaneous tissues and hemostasis obtained with electrocautery.  Dissection was carried down to the external oblique fascia.  Gelpi retractor was placed for exposure.  External oblique fascia was incised in line with its fibers and extended through the external inguinal ring.  There was a moderate-sized indirect inguinal hernia along the round ligament of the uterus.  This was dissected free from the pubic tubercle on the floor of the inguinal  canal.  It was elevated up to the level of the internal inguinal ring.  Inferior epigastric vessels were identified.  Palpation showed a small defect in the floor of the inguinal canal representing a direct inguinal hernia. The indirect inguinal hernia was inverted back within the peritoneal cavity and held in reduction with interrupted 2-0 silk sutures.  Next, the floor of the inguinal canal was reinforced with a sheet of Ethicon UltraPro mesh.  Mesh was cut to the appropriate dimensions.  It was secured to the pubic tubercle along the inguinal ligament with a running 2-0 Novafil suture.  Superior margin of the mesh was secured to the transversalis and internal oblique fascia with interrupted 2-0 Novafil sutures.  Mesh was placed laterally.  It was secured to the fascia with interrupted 2-0 Novafil sutures.  Local field block was placed with Exparel, 10 cc.  External oblique fascia was then closed over the mesh with interrupted 3-0 Vicryl sutures.  Subcutaneous tissues were closed with interrupted 3-0 Vicryl sutures.  An additional 10 cc of Exparel was infiltrated into the skin circumferentially.  Skin edges were reapproximated with a running 4-0 Monocryl subcuticular suture.  Wound was washed and dried and benzoin and Steri-Strips were applied.  Sterile dressings were applied.  The patient was awakened from anesthesia and brought to the recovery room.  The patient tolerated the procedure well.  Velora Heckler, MD, FACS   TMG/MEDQ  D:  01/19/2012  T:  01/20/2012  Job:  [id number]

## 2012-01-27 ENCOUNTER — Ambulatory Visit (INDEPENDENT_AMBULATORY_CARE_PROVIDER_SITE_OTHER): Payer: Medicare Other | Admitting: Surgery

## 2012-01-27 ENCOUNTER — Encounter (INDEPENDENT_AMBULATORY_CARE_PROVIDER_SITE_OTHER): Payer: Self-pay | Admitting: Surgery

## 2012-01-27 VITALS — BP 132/84 | HR 68 | Temp 97.9°F | Resp 18 | Ht 66.0 in | Wt 242.2 lb

## 2012-01-27 DIAGNOSIS — K4090 Unilateral inguinal hernia, without obstruction or gangrene, not specified as recurrent: Secondary | ICD-10-CM

## 2012-01-27 NOTE — Progress Notes (Signed)
Visit Diagnoses: 1. Inguinal hernia unilateral, non-recurrent, left     HISTORY: Patient returns 10 days after left inguinal hernia repair with mesh.  EXAM: Left inguinal wound is healing uneventfully. There is mild to moderate ecchymosis. There is mild soft tissue swelling. There is no sign of infection. With Valsalva there is no sign of recurrence.  IMPRESSION: Status post left inguinal hernia repair with mesh  PLAN: She will remove the Steri-Strips in the next few days. She will begin applying topical creams to her incision. She will return to see me in this office for her final wound check in 3 weeks.  Velora Heckler, MD, FACS General & Endocrine Surgery [facility]

## 2012-01-27 NOTE — Patient Instructions (Signed)
  COCOA BUTTER & VITAMIN E CREAM  ([employer] or other brand)  Apply cocoa butter/vitamin E cream to your incision 2 - 3 times daily.  Massage cream into incision for one minute with each application.  Use sunscreen (50 SPF or higher) for first 6 months after surgery if area is exposed to sun.  You may substitute Mederma or other scar reducing creams as desired.   

## 2012-02-10 ENCOUNTER — Encounter (HOSPITAL_COMMUNITY): Payer: Self-pay | Admitting: Surgery

## 2012-02-22 ENCOUNTER — Encounter (INDEPENDENT_AMBULATORY_CARE_PROVIDER_SITE_OTHER): Payer: Self-pay | Admitting: Surgery

## 2012-02-22 ENCOUNTER — Ambulatory Visit (INDEPENDENT_AMBULATORY_CARE_PROVIDER_SITE_OTHER): Payer: Medicare Other | Admitting: Surgery

## 2012-02-22 VITALS — BP 138/64 | HR 70 | Temp 97.4°F | Resp 18 | Ht 66.0 in | Wt 243.2 lb

## 2012-02-22 DIAGNOSIS — K4090 Unilateral inguinal hernia, without obstruction or gangrene, not specified as recurrent: Secondary | ICD-10-CM

## 2012-02-22 NOTE — Patient Instructions (Signed)
  COCOA BUTTER & VITAMIN E CREAM  ([employer] or other brand)  Apply cocoa butter/vitamin E cream to your incision 2 - 3 times daily.  Massage cream into incision for one minute with each application.  Use sunscreen (50 SPF or higher) for first 6 months after surgery if area is exposed to sun.  You may substitute Mederma or other scar reducing creams as desired.   

## 2012-02-22 NOTE — Progress Notes (Signed)
Visit Diagnoses: 1. Inguinal hernia unilateral, non-recurrent, left     HISTORY: Patient returns today for followup having undergone left inguinal hernia repair with mesh on January 19, 2012.  EXAM: Surgical wound is well-healed. No sign of herniation. No sign of seroma. No sign of infection.  IMPRESSION: Status post left inguinal hernia repair with mesh  PLAN: Patient will apply topical creams to the incision. She is released to full activity. She will return to see me as needed.  Velora Heckler, MD, FACS General & Endocrine Surgery [facility]

## 2012-10-25 ENCOUNTER — Other Ambulatory Visit (HOSPITAL_COMMUNITY): Payer: Self-pay | Admitting: Urology

## 2012-10-25 ENCOUNTER — Ambulatory Visit (HOSPITAL_COMMUNITY)
Admission: RE | Admit: 2012-10-25 | Discharge: 2012-10-25 | Disposition: A | Discharge status: Home / Self Care | Payer: Medicare Other | Source: Ambulatory Visit | Attending: Urology | Admitting: Urology

## 2012-10-25 DIAGNOSIS — C649 Malignant neoplasm of unspecified kidney, except renal pelvis: Secondary | ICD-10-CM

## 2012-10-25 DIAGNOSIS — I10 Essential (primary) hypertension: Secondary | ICD-10-CM | POA: Insufficient documentation

## 2012-12-21 ENCOUNTER — Other Ambulatory Visit: Payer: Self-pay | Admitting: Adult Health

## 2013-01-13 ENCOUNTER — Other Ambulatory Visit: Payer: Self-pay | Admitting: *Deleted

## 2013-01-13 DIAGNOSIS — M7989 Other specified soft tissue disorders: Secondary | ICD-10-CM

## 2013-02-23 ENCOUNTER — Encounter: Payer: Medicare Other | Admitting: Vascular Surgery

## 2013-09-07 ENCOUNTER — Ambulatory Visit
Admission: RE | Admit: 2013-09-07 | Discharge: 2013-09-07 | Disposition: A | Discharge status: Home / Self Care | Payer: Medicare Other | Source: Ambulatory Visit | Attending: Gastroenterology | Admitting: Gastroenterology

## 2013-09-07 ENCOUNTER — Other Ambulatory Visit: Payer: Self-pay | Admitting: Gastroenterology

## 2013-09-07 DIAGNOSIS — K5900 Constipation, unspecified: Secondary | ICD-10-CM

## 2013-09-25 ENCOUNTER — Other Ambulatory Visit: Payer: Self-pay | Admitting: Gastroenterology

## 2013-09-25 DIAGNOSIS — R109 Unspecified abdominal pain: Secondary | ICD-10-CM

## 2013-09-25 DIAGNOSIS — R112 Nausea with vomiting, unspecified: Secondary | ICD-10-CM

## 2013-09-25 DIAGNOSIS — K5900 Constipation, unspecified: Secondary | ICD-10-CM

## 2013-09-25 DIAGNOSIS — K921 Melena: Secondary | ICD-10-CM

## 2013-10-02 ENCOUNTER — Ambulatory Visit
Admission: RE | Admit: 2013-10-02 | Discharge: 2013-10-02 | Disposition: A | Discharge status: Home / Self Care | Payer: Medicare Other | Source: Ambulatory Visit | Attending: Gastroenterology | Admitting: Gastroenterology

## 2013-10-02 DIAGNOSIS — K5900 Constipation, unspecified: Secondary | ICD-10-CM

## 2013-10-02 DIAGNOSIS — K921 Melena: Secondary | ICD-10-CM

## 2013-10-02 DIAGNOSIS — R109 Unspecified abdominal pain: Secondary | ICD-10-CM

## 2013-10-02 DIAGNOSIS — R112 Nausea with vomiting, unspecified: Secondary | ICD-10-CM

## 2013-10-02 MED ORDER — IOHEXOL 300 MG/ML  SOLN
125.0000 mL | Freq: Once | INTRAMUSCULAR | Status: AC | PRN
  Administered 2013-10-02: 125 mL via INTRAVENOUS

## 2013-10-25 ENCOUNTER — Ambulatory Visit
Admission: RE | Admit: 2013-10-25 | Discharge: 2013-10-25 | Disposition: A | Discharge status: Home / Self Care | Payer: Medicare Other | Source: Ambulatory Visit | Attending: Internal Medicine | Admitting: Internal Medicine

## 2013-10-25 ENCOUNTER — Other Ambulatory Visit: Payer: Self-pay | Admitting: Internal Medicine

## 2013-10-25 DIAGNOSIS — R05 Cough: Secondary | ICD-10-CM

## 2013-10-25 DIAGNOSIS — R059 Cough, unspecified: Secondary | ICD-10-CM

## 2013-10-25 DIAGNOSIS — R634 Abnormal weight loss: Secondary | ICD-10-CM

## 2014-07-22 ENCOUNTER — Encounter: Payer: Self-pay | Admitting: *Deleted

## 2014-10-16 ENCOUNTER — Other Ambulatory Visit: Payer: Self-pay | Admitting: Internal Medicine

## 2014-10-16 ENCOUNTER — Ambulatory Visit
Admission: RE | Admit: 2014-10-16 | Discharge: 2014-10-16 | Disposition: A | Discharge status: Home / Self Care | Payer: Commercial Managed Care - HMO | Source: Ambulatory Visit | Attending: Internal Medicine | Admitting: Internal Medicine

## 2014-10-16 DIAGNOSIS — R1084 Generalized abdominal pain: Secondary | ICD-10-CM

## 2015-08-12 ENCOUNTER — Ambulatory Visit
Admission: RE | Admit: 2015-08-12 | Discharge: 2015-08-12 | Disposition: A | Discharge status: Home / Self Care | Payer: Commercial Managed Care - HMO | Source: Ambulatory Visit | Attending: Internal Medicine | Admitting: Internal Medicine

## 2015-08-12 ENCOUNTER — Other Ambulatory Visit: Payer: Self-pay | Admitting: Internal Medicine

## 2015-08-12 DIAGNOSIS — R059 Cough, unspecified: Secondary | ICD-10-CM

## 2015-08-12 DIAGNOSIS — R05 Cough: Secondary | ICD-10-CM

## 2016-01-07 DIAGNOSIS — M170 Bilateral primary osteoarthritis of knee: Secondary | ICD-10-CM | POA: Diagnosis not present

## 2016-01-20 ENCOUNTER — Emergency Department
Admission: EM | Admit: 2016-01-20 | Discharge: 2016-01-20 | Disposition: A | Discharge status: Other Acute Inpatient Hospital | Payer: PPO | Attending: Emergency Medicine | Admitting: Emergency Medicine

## 2016-01-20 ENCOUNTER — Emergency Department: Payer: PPO

## 2016-01-20 ENCOUNTER — Inpatient Hospital Stay (HOSPITAL_COMMUNITY)
Admission: EM | Admit: 2016-01-20 | Discharge: 2016-01-21 | DRG: 087 | Disposition: A | Discharge status: Home / Self Care | Payer: PPO | Attending: Internal Medicine | Admitting: Internal Medicine

## 2016-01-20 ENCOUNTER — Encounter (HOSPITAL_COMMUNITY): Payer: Self-pay | Admitting: Emergency Medicine

## 2016-01-20 DIAGNOSIS — S065X9A Traumatic subdural hemorrhage with loss of consciousness of unspecified duration, initial encounter: Secondary | ICD-10-CM

## 2016-01-20 DIAGNOSIS — E119 Type 2 diabetes mellitus without complications: Secondary | ICD-10-CM | POA: Insufficient documentation

## 2016-01-20 DIAGNOSIS — S42402A Unspecified fracture of lower end of left humerus, initial encounter for closed fracture: Secondary | ICD-10-CM | POA: Diagnosis not present

## 2016-01-20 DIAGNOSIS — S065X0A Traumatic subdural hemorrhage without loss of consciousness, initial encounter: Secondary | ICD-10-CM | POA: Diagnosis not present

## 2016-01-20 DIAGNOSIS — Y9289 Other specified places as the place of occurrence of the external cause: Secondary | ICD-10-CM

## 2016-01-20 DIAGNOSIS — S52122A Displaced fracture of head of left radius, initial encounter for closed fracture: Secondary | ICD-10-CM | POA: Diagnosis not present

## 2016-01-20 DIAGNOSIS — W19XXXA Unspecified fall, initial encounter: Secondary | ICD-10-CM | POA: Diagnosis not present

## 2016-01-20 DIAGNOSIS — I10 Essential (primary) hypertension: Secondary | ICD-10-CM | POA: Diagnosis not present

## 2016-01-20 DIAGNOSIS — E039 Hypothyroidism, unspecified: Secondary | ICD-10-CM | POA: Diagnosis not present

## 2016-01-20 DIAGNOSIS — Z87891 Personal history of nicotine dependence: Secondary | ICD-10-CM

## 2016-01-20 DIAGNOSIS — E785 Hyperlipidemia, unspecified: Secondary | ICD-10-CM | POA: Diagnosis present

## 2016-01-20 DIAGNOSIS — J449 Chronic obstructive pulmonary disease, unspecified: Secondary | ICD-10-CM | POA: Diagnosis present

## 2016-01-20 DIAGNOSIS — S065XAA Traumatic subdural hemorrhage with loss of consciousness status unknown, initial encounter: Secondary | ICD-10-CM | POA: Diagnosis present

## 2016-01-20 DIAGNOSIS — H409 Unspecified glaucoma: Secondary | ICD-10-CM | POA: Diagnosis not present

## 2016-01-20 DIAGNOSIS — J42 Unspecified chronic bronchitis: Secondary | ICD-10-CM | POA: Insufficient documentation

## 2016-01-20 DIAGNOSIS — W01198A Fall on same level from slipping, tripping and stumbling with subsequent striking against other object, initial encounter: Secondary | ICD-10-CM | POA: Diagnosis not present

## 2016-01-20 DIAGNOSIS — Z7984 Long term (current) use of oral hypoglycemic drugs: Secondary | ICD-10-CM | POA: Diagnosis not present

## 2016-01-20 DIAGNOSIS — I6200 Nontraumatic subdural hemorrhage, unspecified: Secondary | ICD-10-CM | POA: Diagnosis not present

## 2016-01-20 DIAGNOSIS — J45909 Unspecified asthma, uncomplicated: Secondary | ICD-10-CM | POA: Diagnosis not present

## 2016-01-20 DIAGNOSIS — Y93K1 Activity, walking an animal: Secondary | ICD-10-CM

## 2016-01-20 DIAGNOSIS — Z79899 Other long term (current) drug therapy: Secondary | ICD-10-CM | POA: Insufficient documentation

## 2016-01-20 DIAGNOSIS — S0181XA Laceration without foreign body of other part of head, initial encounter: Secondary | ICD-10-CM | POA: Diagnosis not present

## 2016-01-20 DIAGNOSIS — W1839XA Other fall on same level, initial encounter: Secondary | ICD-10-CM | POA: Diagnosis not present

## 2016-01-20 DIAGNOSIS — S0990XA Unspecified injury of head, initial encounter: Secondary | ICD-10-CM | POA: Diagnosis not present

## 2016-01-20 DIAGNOSIS — Y929 Unspecified place or not applicable: Secondary | ICD-10-CM | POA: Diagnosis not present

## 2016-01-20 DIAGNOSIS — Z85528 Personal history of other malignant neoplasm of kidney: Secondary | ICD-10-CM

## 2016-01-20 DIAGNOSIS — S0081XA Abrasion of other part of head, initial encounter: Secondary | ICD-10-CM | POA: Insufficient documentation

## 2016-01-20 DIAGNOSIS — E1136 Type 2 diabetes mellitus with diabetic cataract: Secondary | ICD-10-CM | POA: Diagnosis present

## 2016-01-20 DIAGNOSIS — E1139 Type 2 diabetes mellitus with other diabetic ophthalmic complication: Secondary | ICD-10-CM | POA: Diagnosis not present

## 2016-01-20 DIAGNOSIS — Y998 Other external cause status: Secondary | ICD-10-CM | POA: Diagnosis not present

## 2016-01-20 DIAGNOSIS — Z905 Acquired absence of kidney: Secondary | ICD-10-CM | POA: Diagnosis not present

## 2016-01-20 DIAGNOSIS — S52125A Nondisplaced fracture of head of left radius, initial encounter for closed fracture: Secondary | ICD-10-CM | POA: Diagnosis present

## 2016-01-20 DIAGNOSIS — J410 Simple chronic bronchitis: Secondary | ICD-10-CM | POA: Diagnosis not present

## 2016-01-20 DIAGNOSIS — J438 Other emphysema: Secondary | ICD-10-CM | POA: Diagnosis not present

## 2016-01-20 DIAGNOSIS — E7800 Pure hypercholesterolemia, unspecified: Secondary | ICD-10-CM | POA: Diagnosis present

## 2016-01-20 DIAGNOSIS — R001 Bradycardia, unspecified: Secondary | ICD-10-CM | POA: Diagnosis not present

## 2016-01-20 DIAGNOSIS — S52572A Other intraarticular fracture of lower end of left radius, initial encounter for closed fracture: Secondary | ICD-10-CM | POA: Diagnosis not present

## 2016-01-20 LAB — CBC WITH DIFFERENTIAL/PLATELET
Basophils Absolute: 0.1 10*3/uL (ref 0–0.1)
Basophils Relative: 1 %
Eosinophils Absolute: 0.5 10*3/uL (ref 0–0.7)
Eosinophils Relative: 3 %
HCT: 43.6 % (ref 35.0–47.0)
Hemoglobin: 14.8 g/dL (ref 12.0–16.0)
Lymphocytes Relative: 23 %
Lymphs Abs: 3.4 10*3/uL (ref 1.0–3.6)
MCH: 29 pg (ref 26.0–34.0)
MCHC: 33.8 g/dL (ref 32.0–36.0)
MCV: 85.7 fL (ref 80.0–100.0)
Monocytes Absolute: 0.8 10*3/uL (ref 0.2–0.9)
Monocytes Relative: 5 %
Neutro Abs: 10.2 10*3/uL — ABNORMAL HIGH (ref 1.4–6.5)
Neutrophils Relative %: 68 %
Platelets: 164 10*3/uL (ref 150–440)
RBC: 5.09 MIL/uL (ref 3.80–5.20)
RDW: 13.7 % (ref 11.5–14.5)
WBC: 15 10*3/uL — ABNORMAL HIGH (ref 3.6–11.0)

## 2016-01-20 LAB — PROTIME-INR
INR: 0.96
Prothrombin Time: 13 seconds (ref 11.4–15.0)

## 2016-01-20 LAB — BASIC METABOLIC PANEL
Anion gap: 5 (ref 5–15)
BUN: 22 mg/dL — ABNORMAL HIGH (ref 6–20)
CO2: 30 mmol/L (ref 22–32)
Calcium: 9.9 mg/dL (ref 8.9–10.3)
Chloride: 107 mmol/L (ref 101–111)
Creatinine, Ser: 0.76 mg/dL (ref 0.44–1.00)
GFR calc Af Amer: >60 mL/min (ref >60)
GFR calc non Af Amer: >60 mL/min (ref >60)
Glucose, Bld: 149 mg/dL — ABNORMAL HIGH (ref 65–99)
Potassium: 4 mmol/L (ref 3.5–5.1)
Sodium: 142 mmol/L (ref 135–145)

## 2016-01-20 LAB — APTT: aPTT: 27 seconds (ref 24–36)

## 2016-01-20 MED ORDER — POLYETHYLENE GLYCOL 3350 17 G PO PACK: 17.0000 g | PACK | Freq: Every day | ORAL | Status: DC | PRN

## 2016-01-20 MED ORDER — ACETAMINOPHEN 500 MG PO TABS
1000.0000 mg | ORAL_TABLET | Freq: Once | ORAL | Status: AC
  Administered 2016-01-20: 1000 mg via ORAL
  Filled 2016-01-20: qty 2

## 2016-01-20 MED ORDER — LATANOPROST 0.005 % OP SOLN
1.0000 [drp] | Freq: Every day | OPHTHALMIC | Status: DC
  Administered 2016-01-21: 1 [drp] via OPHTHALMIC
  Filled 2016-01-20: qty 2.5

## 2016-01-20 MED ORDER — ONDANSETRON HCL 4 MG/2ML IJ SOLN
4.0000 mg | Freq: Once | INTRAMUSCULAR | Status: AC
  Administered 2016-01-20: 4 mg via INTRAVENOUS
  Filled 2016-01-20: qty 2

## 2016-01-20 MED ORDER — VERAPAMIL HCL ER 180 MG PO TBCR
180.0000 mg | EXTENDED_RELEASE_TABLET | Freq: Every day | ORAL | Status: DC
  Administered 2016-01-21: 180 mg via ORAL
  Filled 2016-01-20: qty 1

## 2016-01-20 MED ORDER — HYDRALAZINE HCL 20 MG/ML IJ SOLN: 5.0000 mg | INTRAMUSCULAR | Status: DC | PRN

## 2016-01-20 MED ORDER — MOMETASONE FURO-FORMOTEROL FUM 200-5 MCG/ACT IN AERO
2.0000 | INHALATION_SPRAY | Freq: Two times a day (BID) | RESPIRATORY_TRACT | Status: DC
  Administered 2016-01-21: 2 via RESPIRATORY_TRACT
  Filled 2016-01-20: qty 8.8

## 2016-01-20 MED ORDER — ONDANSETRON HCL 4 MG/2ML IJ SOLN: 4.0000 mg | Freq: Three times a day (TID) | INTRAMUSCULAR | Status: DC | PRN

## 2016-01-20 MED ORDER — THEOPHYLLINE ER 400 MG PO TB24
400.0000 mg | ORAL_TABLET | Freq: Every day | ORAL | Status: DC
  Administered 2016-01-21: 400 mg via ORAL
  Filled 2016-01-20 (×2): qty 1

## 2016-01-20 MED ORDER — SODIUM CHLORIDE 0.9 % IV SOLN
INTRAVENOUS | Status: DC
Start: 2016-01-20 — End: 2016-01-21
  Administered 2016-01-21: via INTRAVENOUS

## 2016-01-20 MED ORDER — INSULIN ASPART 100 UNIT/ML ~~LOC~~ SOLN: 0.0000 [IU] | Freq: Every day | SUBCUTANEOUS | Status: DC

## 2016-01-20 MED ORDER — FENTANYL CITRATE (PF) 100 MCG/2ML IJ SOLN
25.0000 ug | Freq: Once | INTRAMUSCULAR | Status: AC
  Administered 2016-01-20: 25 ug via INTRAVENOUS
  Filled 2016-01-20: qty 2

## 2016-01-20 MED ORDER — ONDANSETRON HCL 4 MG/2ML IJ SOLN
INTRAMUSCULAR | Status: AC
  Administered 2016-01-20: 4 mg via INTRAVENOUS
  Filled 2016-01-20: qty 2

## 2016-01-20 MED ORDER — ACETAMINOPHEN 325 MG PO TABS
650.0000 mg | ORAL_TABLET | Freq: Four times a day (QID) | ORAL | Status: DC | PRN
  Administered 2016-01-21: 650 mg via ORAL
  Filled 2016-01-20: qty 2

## 2016-01-20 MED ORDER — INSULIN ASPART 100 UNIT/ML ~~LOC~~ SOLN
0.0000 [IU] | Freq: Three times a day (TID) | SUBCUTANEOUS | Status: DC
  Administered 2016-01-21: 1 [IU] via SUBCUTANEOUS

## 2016-01-20 MED ORDER — SODIUM CHLORIDE 0.9% FLUSH
3.0000 mL | Freq: Two times a day (BID) | INTRAVENOUS | Status: DC
  Administered 2016-01-20: 3 mL via INTRAVENOUS

## 2016-01-20 MED ORDER — ONDANSETRON HCL 4 MG/2ML IJ SOLN
4.0000 mg | Freq: Once | INTRAMUSCULAR | Status: AC
  Administered 2016-01-20: 4 mg via INTRAVENOUS

## 2016-01-20 MED ORDER — THYROID 60 MG PO TABS
60.0000 mg | ORAL_TABLET | Freq: Every day | ORAL | Status: DC
  Administered 2016-01-21: 60 mg via ORAL
  Filled 2016-01-20 (×2): qty 1

## 2016-01-20 MED ORDER — ALBUTEROL SULFATE (2.5 MG/3ML) 0.083% IN NEBU: 2.5000 mg | INHALATION_SOLUTION | RESPIRATORY_TRACT | Status: DC | PRN

## 2016-01-20 MED ORDER — LISINOPRIL 20 MG PO TABS
20.0000 mg | ORAL_TABLET | Freq: Every day | ORAL | Status: DC
  Administered 2016-01-21: 20 mg via ORAL
  Filled 2016-01-20: qty 1

## 2016-01-20 MED ORDER — ONDANSETRON HCL 4 MG/2ML IJ SOLN
4.0000 mg | INTRAMUSCULAR | Status: DC | PRN
  Administered 2016-01-20: 4 mg via INTRAVENOUS
  Filled 2016-01-20: qty 2

## 2016-01-20 MED ORDER — ALUM & MAG HYDROXIDE-SIMETH 200-200-20 MG/5ML PO SUSP: 30.0000 mL | Freq: Four times a day (QID) | ORAL | Status: DC | PRN

## 2016-01-20 MED ORDER — SODIUM CHLORIDE 0.9 % IV SOLN
1000.0000 mg | Freq: Once | INTRAVENOUS | Status: AC
  Administered 2016-01-20: 1000 mg via INTRAVENOUS
  Filled 2016-01-20: qty 10

## 2016-01-20 NOTE — ED Notes (Signed)
Pt was walking her dog and fell hitting head on the concrete causing laceration to left forehead - Pt c/o headache and left shoulder pain - MD in to see pt

## 2016-01-20 NOTE — ED Notes (Signed)
Pt returned from CT and xray. 

## 2016-01-20 NOTE — ED Notes (Signed)
Report called to Lilia Pro RN at [facility] ED

## 2016-01-20 NOTE — ED Notes (Signed)
Pt arrives via [facility] from [facility] ED, admits for fall resulting in L radial head fracture and bilateral subdural hematomas without midline shift or hydrocephalus. Denies LOC. Small laceration to L forehead, covered with steristrips. Pt drowsy but answering questions appropriately. VSS stable. Covered with keppra, fentanyl and zofran.

## 2016-01-20 NOTE — ED Provider Notes (Signed)
[facility] Emergency Department Provider Note  ____________________________________________  Time seen: Seen upon arrival to the emergency department  I have reviewed the triage vital signs and the nursing notes.   HISTORY  Chief Complaint Fall    HPI Carolyn [name] is a 82 y.o. female with a history of diabetes and hypertension who is presenting after a fall. She says she was walking her dog and tripped over the leash. She fell on her left elbow and also hit the left side of her forehead. She denies losing consciousness. She denies having any nausea vomiting or dizziness at this time. She says that she feels an aching pain to the outside of her left arm. She is able to range this extremity fully. She denies he hit or lower extremity pain. She says that she had a tetanus shot 2 years ago.   Past Medical History  Diagnosis Date  . Diabetes mellitus   . Hypertension   . Hypercholesterolemia   . Gallstones   . Glaucoma   . Cataracts, bilateral   . COPD (chronic obstructive pulmonary disease) ([place])   . Hypothyroid   . PONV (postoperative nausea and vomiting)   . Asthma   . Chronic kidney disease 2007    kidney cancer and partial nephrctomy-right    Patient Active Problem List   Diagnosis Date Noted  . Inguinal hernia unilateral, non-recurrent, left 12/02/2011    Past Surgical History  Procedure Laterality Date  . Dilation and curettage of uterus    . Partial nephrectomy      cancer  . Breast biopsy    . D&c for enlarged uterine  210  . Total abdominal hysterectomy w/ bilateral salpingoophorectomy  2011  . Cateract extractions and iol    . Cholecystectomy  2008  . Abdominal hysterectomy    . Eye surgery  08/2011,09/2012    bilateral cataracts  . Hernia repair  01/19/12    inguinal  . Inguinal hernia repair  01/19/2012    Procedure: HERNIA REPAIR INGUINAL ADULT;  Surgeon: Earnstine Regal, MD;  Location: [facility] ORS;  Service: General;  Laterality: Left;   repair left inguinal hernia with mesh     Current Outpatient Rx  Name  Route  Sig  Dispense  Refill  . Fluticasone-Salmeterol (ADVAIR) 250-50 MCG/DOSE AEPB   Inhalation   Inhale 1 puff into the lungs 2 (two) times daily as needed (for wheezing/shortness of breath).          [name] glimepiride (AMARYL) 2 MG tablet   Oral   Take 4 mg by mouth at bedtime.          [name] latanoprost (XALATAN) 0.005 % ophthalmic solution   Both Eyes   Place 1 drop into both eyes at bedtime.          [name] lisinopril (PRINIVIL,ZESTRIL) 20 MG tablet   Oral   Take 20 mg by mouth at bedtime.          . pirbuterol (MAXAIR) 200 MCG/INH inhaler   Inhalation   Inhale 2 puffs into the lungs 4 (four) times daily as needed for wheezing or shortness of breath.          . polyethylene glycol (MIRALAX / GLYCOLAX) packet   Oral   Take 17 g by mouth daily as needed for mild constipation.          . theophylline (UNIPHYL) 400 MG 24 hr tablet   Oral   Take 400 mg by mouth at  bedtime.         [name] thyroid (ARMOUR) 60 MG tablet   Oral   Take 60 mg by mouth at bedtime.          . verapamil (CALAN-SR) 180 MG CR tablet   Oral   Take 180 mg by mouth at bedtime.           Allergies Darvocet; Metformin and related; Percocet; and Vicodin  Family History  Problem Relation Age of Onset  . Cancer      Social History Social History  Substance Use Topics  . Smoking status: Former Smoker -- 1.00 packs/day for 5 years    Types: Cigarettes    Quit date: 01/11/1982  . Smokeless tobacco: Never Used     Comment: quit maybe 65yrs  . Alcohol Use: No    Review of Systems Constitutional: No fever/chills Eyes: No visual changes. ENT: No sore throat. Cardiovascular: Denies chest pain. Respiratory: Denies shortness of breath. Gastrointestinal: No abdominal pain.  No nausea, no vomiting.  No diarrhea.  No constipation. Genitourinary: Negative for dysuria. Musculoskeletal: Negative for back pain. Skin: Negative  for rash. Neurological: Negative for headaches, focal weakness or numbness.  10-point ROS otherwise negative.  ____________________________________________   PHYSICAL EXAM:  VITAL SIGNS: ED Triage Vitals  Enc Vitals Group     BP 01/20/16 1750 180/78 mmHg     Pulse Rate 01/20/16 1750 72     Resp 01/20/16 1800 15     Temp 01/20/16 1750 97.9 F (36.6 C)     Temp Source 01/20/16 1750 Oral     SpO2 01/20/16 1747 97 %     Weight 01/20/16 1750 211 lb (95.709 kg)     Height 01/20/16 1750 5\' 6"  (1.676 m)     Head Cir --      Peak Flow --      Pain Score 01/20/16 1755 6     Pain Loc --      Pain Edu? --      Excl. in [place]? --     Constitutional: Alert and oriented. Well appearing and in no acute distress. Eyes: Conjunctivae are normal. PERRL. EOMI. Head: Superficial lacerations 2 to the left side of the forehead. There is no active bleeding. No surrounding induration, tenderness or pus. Nose: No congestion/rhinnorhea. Mouth/Throat: Mucous membranes are moist.  Oropharynx non-erythematous. Neck: No stridor.  No tenderness to the C-spine. There is no step-off or deformity. Cardiovascular: Normal rate, regular rhythm. Grossly normal heart sounds.  Good peripheral circulation. Respiratory: Normal respiratory effort.  No retractions. Lungs CTAB. Gastrointestinal: Soft and nontender. No distention. No abdominal bruits. No CVA tenderness. Musculoskeletal: No lower extremity tenderness nor edema.  No joint effusions. Left elbow with mild tenderness laterally without any point tenderness. There is also tenderness to the proximal lateral forearm as well as lateral arm. No deformity, ecchymosis or skin tear. No tenderness to the bilateral hips. Patient is able to range her bilateral lower extremities fully. Neurologic:  Normal speech and language. No gross focal neurologic deficits are appreciated.  Skin:  Skin is warm, dry and intact. No rash noted. Psychiatric: Mood and affect are normal.  Speech and behavior are normal.  ____________________________________________   LABS (all labs ordered are listed, but only abnormal results are displayed)  Labs Reviewed  CBC WITH DIFFERENTIAL/PLATELET - Abnormal; Notable for the following:    WBC 15.0 (*)    Neutro Abs 10.2 (*)    All other components within normal limits  BASIC  METABOLIC PANEL  PROTIME-INR  APTT   ____________________________________________  EKG   ____________________________________________  RADIOLOGY  Acute bilateral subdural hematomas without midline shift or hydrocephalus. Mildly impacted intra-articular fracture of the radial head on the left. No dislocation. ____________________________________________   PROCEDURES  CRITICAL CARE Performed by: Doran Stabler   Total critical care time: 35 minutes  Critical care time was exclusive of separately billable procedures and treating other patients.  Critical care was necessary to treat or prevent imminent or life-threatening deterioration.  Critical care was time spent personally by me on the following activities: development of treatment plan with patient and/or surrogate as well as nursing, discussions with consultants, evaluation of patient's response to treatment, examination of patient, obtaining history from patient or surrogate, ordering and performing treatments and interventions, ordering and review of laboratory studies, ordering and review of radiographic studies, pulse oximetry and re-evaluation of patient's condition.  ____________________________________________   INITIAL IMPRESSION / ASSESSMENT AND PLAN / ED COURSE  Pertinent labs & imaging results that were available during my care of the patient were reviewed by me and considered in my medical decision making (see chart for details).  ----------------------------------------- 655 PM on 01/20/2016 -----------------------------------------  Patient as well as her daughter,  who is at the bedside, updated about radiology findings. They're requesting to be transferred to [facility] where the daughter is a Marine scientist. Call made out to [facility] for transfer at this time. Patient also with increasing headache but neuro intact. Patient says that she last took any blood thinners 10 days ago when she took an aspirin, 81 mg.  ----------------------------------------- 8:00 PM on 01/20/2016 -----------------------------------------  Initial call to the transfer center at [facility] was made at 7 PM. The transfer service was called back at approximately 720 after we were not hearing back from the neurosurgeon. At 7:30 PM, after still not hearing back from the neurosurgeon, we called back again asking for the emergency doctor at [facility]. At approximately 7:35 PM I was able to get in touch with Dr.Knott, the emergency medicine doctor at [facility]. He accepted the patient to the emergency department.  At approximately 740, approximately, Dr.Ditty of the neurosurgical service called back. I discussed the case with him including the CAT scan and he says to admit the patient to a medical service and not to the neurologic service. I was able to get back in touch with Dr.Knott of the emergency department to make sure that he admits the patient to the medicine service. He still agrees to accept the transfer. Patient will be going to [facility] via critical care transport. Also check on the splint. The patient is tolerating it well and is neurovascularly intact. I personally supervised the splinting procedure. During the period of time where we were not hearing back from the neurosurgical service I did discuss with the patient and family possibly transferring to a different hospital such as the [facility] [place] or [place]. However, they did not want to be transferred to any hospital or the residents that would potentially be involved with  care. ____________________________________________   FINAL CLINICAL IMPRESSION(S) / ED DIAGNOSES  Final diagnoses:  Subdural hematoma ([place])  Fall, initial encounter  Radial head fracture, closed, left, initial encounter      Orbie Pyo, MD 01/20/16 2008

## 2016-01-20 NOTE — ED Notes (Signed)
Ems reports pt fell while walking dog and hit left forehead - Also c/o left shoulder pain - Laceration to left forehead - Pt is alert and oriented X4

## 2016-01-20 NOTE — ED Provider Notes (Signed)
CSN: [id number]     Arrival date & time 01/20/16  2131 History   First MD Initiated Contact with Patient 01/20/16 2137     Chief Complaint  Patient presents with  . Head Injury     (Consider location/radiation/quality/duration/timing/severity/associated sxs/prior Treatment) Patient is a 82 y.o. female presenting with fall. The history is provided by the patient.  Fall This is a new problem. The current episode started 6 to 12 hours ago (fell being pulled  by dog). The problem occurs constantly. The problem has not changed since onset.Associated symptoms include headaches. Nothing aggravates the symptoms. Nothing relieves the symptoms. She has tried nothing for the symptoms.    Past Medical History  Diagnosis Date  . Diabetes mellitus   . Hypertension   . Hypercholesterolemia   . Gallstones   . Glaucoma   . Cataracts, bilateral   . COPD (chronic obstructive pulmonary disease) ([place])   . Hypothyroid   . PONV (postoperative nausea and vomiting)   . Asthma   . Chronic kidney disease 2007    kidney cancer and partial nephrctomy-right   Past Surgical History  Procedure Laterality Date  . Dilation and curettage of uterus    . Partial nephrectomy      cancer  . Breast biopsy    . D&c for enlarged uterine  210  . Total abdominal hysterectomy w/ bilateral salpingoophorectomy  2011  . Cateract extractions and iol    . Cholecystectomy  2008  . Abdominal hysterectomy    . Eye surgery  08/2011,09/2012    bilateral cataracts  . Hernia repair  01/19/12    inguinal  . Inguinal hernia repair  01/19/2012    Procedure: HERNIA REPAIR INGUINAL ADULT;  Surgeon: Earnstine Regal, MD;  Location: [facility];  Service: General;  Laterality: Left;  repair left inguinal hernia with mesh    Family History  Problem Relation Age of Onset  . Cancer     Social History  Substance Use Topics  . Smoking status: Former Smoker -- 1.00 packs/day for 5 years    Types: Cigarettes    Quit date: 01/11/1982  .  Smokeless tobacco: Never Used     Comment: quit maybe 69yrs  . Alcohol Use: No   OB History    No data available     Review of Systems  Neurological: Positive for headaches.  All other systems reviewed and are negative.     Allergies  Darvocet; Metformin and related; Percocet; and Vicodin  Home Medications   Prior to Admission medications   Medication Sig Start Date End Date Taking? Authorizing Provider  Fluticasone-Salmeterol (ADVAIR) 250-50 MCG/DOSE AEPB Inhale 1 puff into the lungs 2 (two) times daily as needed (for wheezing/shortness of breath).     Historical Provider, MD  glimepiride (AMARYL) 2 MG tablet Take 4 mg by mouth at bedtime.     Historical Provider, MD  latanoprost (XALATAN) 0.005 % ophthalmic solution Place 1 drop into both eyes at bedtime.     Historical Provider, MD  lisinopril (PRINIVIL,ZESTRIL) 20 MG tablet Take 20 mg by mouth at bedtime.     Historical Provider, MD  pirbuterol (MAXAIR) 200 MCG/INH inhaler Inhale 2 puffs into the lungs 4 (four) times daily as needed for wheezing or shortness of breath.     Historical Provider, MD  polyethylene glycol (MIRALAX / GLYCOLAX) packet Take 17 g by mouth daily as needed for mild constipation.     Historical Provider, MD  theophylline (UNIPHYL) 400 MG 24  hr tablet Take 400 mg by mouth at bedtime.    Historical Provider, MD  thyroid (ARMOUR) 60 MG tablet Take 60 mg by mouth at bedtime.     Historical Provider, MD  verapamil (CALAN-SR) 180 MG CR tablet Take 180 mg by mouth at bedtime.    Historical Provider, MD   BP 121/75 mmHg  Pulse 75  Temp(Src) 97.5 F (36.4 C) (Oral)  Resp 16  SpO2 95%  LMP 03/24/2010 Physical Exam  Constitutional: She is oriented to person, place, and time. She appears well-developed and well-nourished. No distress.  HENT:  Head: Normocephalic.  Hemostatic abrasion and small lac of right forehead with steri strips in place  Eyes: Conjunctivae are normal.  Neck: Neck supple. No tracheal  deviation present.  Cardiovascular: Normal rate, regular rhythm and normal heart sounds.   Pulmonary/Chest: Effort normal and breath sounds normal. No respiratory distress.  Abdominal: Soft. She exhibits no distension.  Musculoskeletal:  Left arm splinted  Neurological: She is alert and oriented to person, place, and time. She has normal strength. No cranial nerve deficit. GCS eye subscore is 4. GCS verbal subscore is 5. GCS motor subscore is 6.  Skin: Skin is warm and dry.  Psychiatric: She has a normal mood and affect.    ED Course  Procedures (including critical care time)  CRITICAL CARE Performed by: Leo Grosser Total critical care time: 30 minutes Critical care time was exclusive of separately billable procedures and treating other patients. Critical care was necessary to treat or prevent imminent or life-threatening deterioration. Critical care was time spent personally by me on the following activities: development of treatment plan with patient and/or surrogate as well as nursing, discussions with consultants, evaluation of patient's response to treatment, examination of patient, obtaining history from patient or surrogate, ordering and performing treatments and interventions, ordering and review of laboratory studies, ordering and review of radiographic studies, pulse oximetry and re-evaluation of patient's condition.  Labs Review Labs Reviewed  COMPREHENSIVE METABOLIC PANEL  CBC  HEMOGLOBIN A1C  LIPID PANEL  TSH  THEOPHYLLINE LEVEL  TYPE AND SCREEN  ABO/RH    Imaging Review Dg Elbow Complete Left  01/20/2016  CLINICAL DATA:  Left elbow pain after falling while walking dog today. EXAM: LEFT ELBOW - COMPLETE 3+ VIEW COMPARISON:  None. FINDINGS: There is an acute, mildly displaced intra-articular fracture of the radial head, demonstrating up to 2 mm of impaction of the articular surface. No other acute fractures are seen. There is no dislocation. There is spurring of the  coronoid process. There is an elbow joint effusion. IMPRESSION: Mildly impacted intra-articular fracture of the radial head. No dislocation. Electronically Signed   By: Richardean Sale M.D.   On: 01/20/2016 18:37   Ct Head Wo Contrast  01/20/2016  CLINICAL DATA:  Headache and left shoulder pain after falling while walking dog today. EXAM: CT HEAD WITHOUT CONTRAST TECHNIQUE: Contiguous axial images were obtained from the base of the skull through the vertex without intravenous contrast. COMPARISON:  None. FINDINGS: Brain: There are small acute subdural hematomas bilaterally. On the right, there is a subdural hematoma measuring up to 8 mm in thickness adjacent to right frontal and temporal lobes. On the left, the subdural hematoma is more extensive, but measures only 5 mm in thickness. No evidence of intraparenchymal hematoma, brain edema, midline shift or hydrocephalus. There is no CT evidence of acute cortical infarction. There are patchy chronic small vessel ischemic changes within the periventricular white matter and basal ganglia  bilaterally. Bones/sinuses/visualized face: There is extensive chronic paranasal sinus disease with near complete opacification of the frontal and ethmoid sinuses bilaterally. There are no air-fluid levels. The mastoid air cells and middle ears are clear. The calvarium is intact. IMPRESSION: 1. Acute bilateral subdural hematomas without midline shift or hydrocephalus. 2. Chronic paranasal sinus disease. 3. No acute osseous findings. 4. Critical Value/emergent results were called by telephone at the time of interpretation on 01/20/2016 at 6:51 pm to Dr. Larae Grooms , who verbally acknowledged these results. Electronically Signed   By: Richardean Sale M.D.   On: 01/20/2016 18:53   I have personally reviewed and evaluated these images and lab results as part of my medical decision-making.   EKG Interpretation None      MDM   Final diagnoses:  Diabetes mellitus without  complication (HCC)  Essential hypertension  Hypercholesterolemia  Chronic bronchitis, unspecified chronic bronchitis type (HCC)  Hypothyroidism, unspecified hypothyroidism type  SDH (subdural hematoma) ([place])    82 y.o. female presents with SDH in transfer from OSH. Has bilateral acute bleed from fall after being pulled down by her dog. Having vomiting on arrival but no acute focal deficits. Patient was discussed with neurosurgery at OSH and they recommended medical admission. Hospitalist was consulted for admission and will see the patient in the emergency department. Given zofran for vomiting episodes and tylenol for headache. L radial head fracture likely with be treated with early mobility to prevent ROM difficulty later. I was asked to call neurosurgery for recommendations on level of care. They recommend stepdown for close monitoring.     Leo Grosser, MD 01/21/16 [id number]

## 2016-01-20 NOTE — H&P (Signed)
[facility] History and Physical  Carolyn [name] [id number] DOB: Jan 07, 1933 DOA: 01/20/2016  Referring physician: ED physician PCP: Irven Shelling, MD  Specialists:   Chief Complaint: Fall and head injury  HPI: Carolyn [name] is a 82 y.o. female with PMH of hypertension, hyperlipidemia, diabetes mellitus, COPD, asthma, glaucoma, chronic kidney disease secondary to right partial nephrectomy due to cancer, who presents with fall, head injury and left elbow pain.  Pt reports that she was pulled by her dog when she was walking her dog. She fell hitting head on the concrete causing laceration to left forehead. Pt did not have LOC, but developed headache and left elbow pain. Patient does not have unilateral weakness, numbness or tingling sensations. No vision change or hearing loss. Patient does not have chest pain, shortness breath, abdominal pain or diarrhea. She has nausea, but no vomiting currently. She has chronic poor control of her bladder, which is normal to the patient. No dysuria. No fever or chills. Patient states that she took Aspirin 10 days ago. Currently not on blood thinner.  In ED, patient was found to have WBC 15.0, temperature normal, electrolytes and renal function okay, INR 0.96, PTT 27. X-ray of left elbow showed mildly impacted intra-articular fracture of the radial head without dislocation. Ct-head showed small acute subdural hematomas bilaterally. On the right, there is a subdural hematoma measuring up to 8 mm in thickness adjacent to right frontal and temporal lobes. On the left, the subdural hematoma is more extensive, but measures only 5 mm in thickness. No evidence of intraparenchymal hematoma, brain edema, midline shift or hydrocephalus. There is no CT evidence of acute cortical infarction. There are patchy chronic small vessel ischemic changes within the periventricular white matter and basal ganglia bilaterally.  EKG: Not done in ED, will get one.   Where  does patient live?   At home  Can patient participate in ADLs? Little  Review of Systems:   General: no fevers, chills, no changes in body weight, has fatigue. Has HA. HEENT: no blurry vision, hearing changes or sore throat Pulm: no dyspnea, coughing, wheezing CV: no chest pain, no palpitations Abd: has nausea, no vomiting, abdominal pain, diarrhea, constipation GU: no dysuria, burning on urination, has increased urinary frequency, no hematuria  Ext: no leg edema Neuro: no unilateral weakness, numbness, or tingling, no vision change or hearing loss Skin: no rash. Has small skin tear over left forehead. MSK: No muscle spasm, no deformity, no limitation of range of movement in spin Heme: No easy bruising.  Travel history: No recent long distant travel.  Allergy:  Allergies  Allergen Reactions  . Darvocet [Propoxyphene N-Acetaminophen] Nausea And Vomiting  . Metformin And Related Diarrhea  . Percocet [Oxycodone-Acetaminophen] Nausea And Vomiting  . Vicodin [Hydrocodone-Acetaminophen] Itching    Past Medical History  Diagnosis Date  . Diabetes mellitus   . Hypertension   . Hypercholesterolemia   . Gallstones   . Glaucoma   . Cataracts, bilateral   . COPD (chronic obstructive pulmonary disease) ([place])   . Hypothyroid   . PONV (postoperative nausea and vomiting)   . Asthma   . Chronic kidney disease 2007    kidney cancer and partial nephrctomy-right    Past Surgical History  Procedure Laterality Date  . Dilation and curettage of uterus    . Partial nephrectomy      cancer  . Breast biopsy    . D&c for enlarged uterine  210  . Total abdominal hysterectomy w/ bilateral salpingoophorectomy  2011  . Cateract extractions and iol    . Cholecystectomy  2008  . Abdominal hysterectomy    . Eye surgery  08/2011,09/2012    bilateral cataracts  . Hernia repair  01/19/12    inguinal  . Inguinal hernia repair  01/19/2012    Procedure: HERNIA REPAIR INGUINAL ADULT;  Surgeon: Earnstine Regal, MD;  Location: [facility];  Service: General;  Laterality: Left;  repair left inguinal hernia with mesh     Social History:  reports that she quit smoking about 34 years ago. Her smoking use included Cigarettes. She has a 5 pack-year smoking history. She has never used smokeless tobacco. She reports that she does not drink alcohol or use illicit drugs.  Family History:  Family History  Problem Relation Age of Onset  . Cancer       Prior to Admission medications   Medication Sig Start Date End Date Taking? Authorizing Provider  Fluticasone-Salmeterol (ADVAIR) 250-50 MCG/DOSE AEPB Inhale 1 puff into the lungs 2 (two) times daily as needed (for wheezing/shortness of breath).     Historical Provider, MD  glimepiride (AMARYL) 2 MG tablet Take 4 mg by mouth at bedtime.     Historical Provider, MD  latanoprost (XALATAN) 0.005 % ophthalmic solution Place 1 drop into both eyes at bedtime.     Historical Provider, MD  lisinopril (PRINIVIL,ZESTRIL) 20 MG tablet Take 20 mg by mouth at bedtime.     Historical Provider, MD  pirbuterol (MAXAIR) 200 MCG/INH inhaler Inhale 2 puffs into the lungs 4 (four) times daily as needed for wheezing or shortness of breath.     Historical Provider, MD  polyethylene glycol (MIRALAX / GLYCOLAX) packet Take 17 g by mouth daily as needed for mild constipation.     Historical Provider, MD  theophylline (UNIPHYL) 400 MG 24 hr tablet Take 400 mg by mouth at bedtime.    Historical Provider, MD  thyroid (ARMOUR) 60 MG tablet Take 60 mg by mouth at bedtime.     Historical Provider, MD  verapamil (CALAN-SR) 180 MG CR tablet Take 180 mg by mouth at bedtime.    Historical Provider, MD    Physical Exam: Filed Vitals:   01/20/16 2145 01/20/16 2200 01/20/16 2215 01/20/16 2230  BP: 124/60  152/87   Pulse: 73 92 73 72  Temp:      TempSrc:      Resp: 14 19 20 19   SpO2: 96% 93% 93% 94%   General: Not in acute distress HEENT:       Eyes: PERRL, EOMI, no scleral icterus.        ENT: No discharge from the ears and nose, no pharynx injection, no tonsillar enlargement.        Neck: No JVD, no bruit, no mass felt. Heme: No neck lymph node enlargement. Cardiac: S1/S2, RRR, No murmurs, No gallops or rubs. Pulm: No rales, wheezing, rhonchi or rubs. Abd: Soft, nondistended, nontender, no rebound pain, no organomegaly, BS present. Ext: No pitting leg edema bilaterally. 2+DP/PT pulse bilaterally. Musculoskeletal: left elbow has cast which is done in [place] ED. Skin: No rashes. Has small skin tear over left forehead. Neuro: Alert, oriented X3, cranial nerves II-XII grossly intact, moves all extremities normally. Muscle strength 5/5 in all extremities, sensation to light touch intact.  Psych: Patient is not psychotic, no suicidal or hemocidal ideation.  Labs on Admission:  Basic Metabolic Panel:  Recent Labs Lab 01/20/16 1934  NA 142  K 4.0  CL  107  CO2 30  GLUCOSE 149*  BUN 22*  CREATININE 0.76  CALCIUM 9.9   Liver Function Tests: No results for input(s): AST, ALT, ALKPHOS, BILITOT, PROT, ALBUMIN in the last 168 hours. No results for input(s): LIPASE, AMYLASE in the last 168 hours. No results for input(s): AMMONIA in the last 168 hours. CBC:  Recent Labs Lab 01/20/16 1934  WBC 15.0*  NEUTROABS 10.2*  HGB 14.8  HCT 43.6  MCV 85.7  PLT 164   Cardiac Enzymes: No results for input(s): CKTOTAL, CKMB, CKMBINDEX, TROPONINI in the last 168 hours.  BNP (last 3 results) No results for input(s): BNP in the last 8760 hours.  ProBNP (last 3 results) No results for input(s): PROBNP in the last 8760 hours.  CBG: No results for input(s): GLUCAP in the last 168 hours.  Radiological Exams on Admission: Dg Elbow Complete Left  01/20/2016  CLINICAL DATA:  Left elbow pain after falling while walking dog today. EXAM: LEFT ELBOW - COMPLETE 3+ VIEW COMPARISON:  None. FINDINGS: There is an acute, mildly displaced intra-articular fracture of the radial head,  demonstrating up to 2 mm of impaction of the articular surface. No other acute fractures are seen. There is no dislocation. There is spurring of the coronoid process. There is an elbow joint effusion. IMPRESSION: Mildly impacted intra-articular fracture of the radial head. No dislocation. Electronically Signed   By: Richardean Sale M.D.   On: 01/20/2016 18:37   Ct Head Wo Contrast  01/20/2016  CLINICAL DATA:  Headache and left shoulder pain after falling while walking dog today. EXAM: CT HEAD WITHOUT CONTRAST TECHNIQUE: Contiguous axial images were obtained from the base of the skull through the vertex without intravenous contrast. COMPARISON:  None. FINDINGS: Brain: There are small acute subdural hematomas bilaterally. On the right, there is a subdural hematoma measuring up to 8 mm in thickness adjacent to right frontal and temporal lobes. On the left, the subdural hematoma is more extensive, but measures only 5 mm in thickness. No evidence of intraparenchymal hematoma, brain edema, midline shift or hydrocephalus. There is no CT evidence of acute cortical infarction. There are patchy chronic small vessel ischemic changes within the periventricular white matter and basal ganglia bilaterally. Bones/sinuses/visualized face: There is extensive chronic paranasal sinus disease with near complete opacification of the frontal and ethmoid sinuses bilaterally. There are no air-fluid levels. The mastoid air cells and middle ears are clear. The calvarium is intact. IMPRESSION: 1. Acute bilateral subdural hematomas without midline shift or hydrocephalus. 2. Chronic paranasal sinus disease. 3. No acute osseous findings. 4. Critical Value/emergent results were called by telephone at the time of interpretation on 01/20/2016 at 6:51 pm to Dr. Larae Grooms , who verbally acknowledged these results. Electronically Signed   By: Richardean Sale M.D.   On: 01/20/2016 18:53    Assessment/Plan Principal Problem:   SDH (subdural  hematoma) (HCC) Active Problems:   Diabetes mellitus without complication (HCC)   Hypercholesterolemia   COPD (chronic obstructive pulmonary disease) ([place])   Hypothyroid   Fall   Closed fracture of left elbow   Essential hypertension   SDH (subdural hematoma) ([place]): Ct-head showed small bilateral SDH without midline shift or hydrocephalus. Neurosurgeon, Dr. Cyndy Freeze was consulted by EDP. -will admit to SDU for close monitoring -Frequent neurochecks, every 2 hours -Follow-up neurosurgeon's recommendations. -Repeat CT-head in AM  Left elbow fracture: X-ray of left elbow showed mildly impacted intra-articular fracture of the radial head without dislocation. Pt has cast. No neurovascular compromise. Patient's  daughter has a preference for choosing ortho, Dr. Amedeo Plenty -prn tylenol for pain (pt is allergic to Narcotics) -please call Dr. Amedeo Plenty in AM.  DM-II: Last A1c not on record. Patient is taking Amaryl at home -SSI -Check A1c  HLD: Last LDL was not on record. Pt is not taking medications at home -Check FLP  Hypothyroidism: Last TSH was noted on record -Continue home Synthroid Armour -Check TSH  COPD and asthma: stable. No signs of acute exacerbation. -switch home Advair to [facility] inhaler -continue Theophyline and check it level -prn Albuterol nebs  Essential hypertension: -Continue verapamil, lisinopril -When necessary hydralazine IV   DVT ppx: SCD  Code Status: Full code Family Communication: Yes, patient's dauhter at bed side Disposition Plan: Admit to inpatient   Date of Service 01/20/2016    Ivor Costa [facility] Pager [phone]  If 7PM-7AM, please contact night-coverage [email or web address] Password [email or web address] 01/20/2016, 11:01 PM

## 2016-01-21 ENCOUNTER — Inpatient Hospital Stay (HOSPITAL_COMMUNITY): Payer: PPO

## 2016-01-21 DIAGNOSIS — I6200 Nontraumatic subdural hemorrhage, unspecified: Secondary | ICD-10-CM | POA: Diagnosis not present

## 2016-01-21 DIAGNOSIS — J410 Simple chronic bronchitis: Secondary | ICD-10-CM

## 2016-01-21 DIAGNOSIS — I10 Essential (primary) hypertension: Secondary | ICD-10-CM

## 2016-01-21 DIAGNOSIS — J42 Unspecified chronic bronchitis: Secondary | ICD-10-CM | POA: Diagnosis not present

## 2016-01-21 DIAGNOSIS — S065X0A Traumatic subdural hemorrhage without loss of consciousness, initial encounter: Secondary | ICD-10-CM | POA: Diagnosis not present

## 2016-01-21 DIAGNOSIS — J438 Other emphysema: Secondary | ICD-10-CM

## 2016-01-21 LAB — URINALYSIS, ROUTINE W REFLEX MICROSCOPIC
Bilirubin Urine: NEGATIVE
Glucose, UA: NEGATIVE mg/dL
Hgb urine dipstick: NEGATIVE
Ketones, ur: NEGATIVE mg/dL
Nitrite: NEGATIVE
Protein, ur: NEGATIVE mg/dL
Specific Gravity, Urine: 1.026 (ref 1.005–1.030)
pH: 6 (ref 5.0–8.0)

## 2016-01-21 LAB — COMPREHENSIVE METABOLIC PANEL
ALT: 10 U/L — ABNORMAL LOW (ref 14–54)
AST: 15 U/L (ref 15–41)
Albumin: 3.1 g/dL — ABNORMAL LOW (ref 3.5–5.0)
Alkaline Phosphatase: 71 U/L (ref 38–126)
Anion gap: 11 (ref 5–15)
BUN: 15 mg/dL (ref 6–20)
CO2: 26 mmol/L (ref 22–32)
Calcium: 9.8 mg/dL (ref 8.9–10.3)
Chloride: 106 mmol/L (ref 101–111)
Creatinine, Ser: 0.73 mg/dL (ref 0.44–1.00)
GFR calc Af Amer: >60 mL/min (ref >60)
GFR calc non Af Amer: >60 mL/min (ref >60)
Glucose, Bld: 148 mg/dL — ABNORMAL HIGH (ref 65–99)
Potassium: 4.3 mmol/L (ref 3.5–5.1)
Sodium: 143 mmol/L (ref 135–145)
Total Bilirubin: 0.9 mg/dL (ref 0.3–1.2)
Total Protein: 6 g/dL — ABNORMAL LOW (ref 6.5–8.1)

## 2016-01-21 LAB — GLUCOSE, CAPILLARY
Glucose-Capillary: 109 mg/dL — ABNORMAL HIGH (ref 65–99)
Glucose-Capillary: 139 mg/dL — ABNORMAL HIGH (ref 65–99)

## 2016-01-21 LAB — T4, FREE: Free T4: 0.66 ng/dL (ref 0.61–1.12)

## 2016-01-21 LAB — URINE MICROSCOPIC-ADD ON

## 2016-01-21 LAB — TROPONIN I: Troponin I: <0.03 ng/mL (ref <0.031)

## 2016-01-21 LAB — CBC
HCT: 39.8 % (ref 36.0–46.0)
Hemoglobin: 12.9 g/dL (ref 12.0–15.0)
MCH: 28.2 pg (ref 26.0–34.0)
MCHC: 32.4 g/dL (ref 30.0–36.0)
MCV: 87.1 fL (ref 78.0–100.0)
Platelets: 163 10*3/uL (ref 150–400)
RBC: 4.57 MIL/uL (ref 3.87–5.11)
RDW: 13.9 % (ref 11.5–15.5)
WBC: 12.6 10*3/uL — ABNORMAL HIGH (ref 4.0–10.5)

## 2016-01-21 LAB — TYPE AND SCREEN
ABO/RH(D): O POS
Antibody Screen: NEGATIVE

## 2016-01-21 LAB — MRSA PCR SCREENING: MRSA by PCR: NEGATIVE

## 2016-01-21 LAB — LIPID PANEL
Cholesterol: 155 mg/dL (ref 0–200)
HDL: 59 mg/dL (ref >40)
LDL Cholesterol: 86 mg/dL (ref 0–99)
Total CHOL/HDL Ratio: 2.6 RATIO
Triglycerides: 50 mg/dL (ref <150)
VLDL: 10 mg/dL (ref 0–40)

## 2016-01-21 LAB — TSH: TSH: 0.285 u[IU]/mL — ABNORMAL LOW (ref 0.350–4.500)

## 2016-01-21 LAB — ABO/RH: ABO/RH(D): O POS

## 2016-01-21 MED ORDER — VERAPAMIL HCL ER 120 MG PO TBCR
120.0000 mg | EXTENDED_RELEASE_TABLET | Freq: Every day | ORAL | Status: DC
  Filled 2016-01-21: qty 1

## 2016-01-21 NOTE — Progress Notes (Signed)
DC instructions given to pt and dtr at this time.  Pt verbalized understanding.  DC'd iv.  Dc'd tele.  Central monitoring notified.

## 2016-01-21 NOTE — Evaluation (Signed)
Occupational Therapy Evaluation Patient Details Name: Carolyn [name] MRN: [id number] DOB: [birth year] Today's Date: 01/21/2016    History of Present Illness Pt is a 82 y/o F who fell because of being pulled while walking her dog.  Pt w/ resultant small Bil SDH and Lt radial head fx casted and in sling.  Pt's PMH includes DM, glaucoma, cataracts Bil, COPD, CKD, Bil knee arthiritis (per pt), cholecystectomy.   Clinical Impression   Patient evaluated by Occupational Therapy with no further acute OT needs identified. All education has been completed and the patient has no further questions. Pt requires mod A for ADLs, daughter reports she feels confident with assisting pt.  Daughter aware that pt needs assist anytime she is up on her feet, and understands the risk of falls.  Pt does demonstrate memory impairment and decreased safety awareness which daughter reports is baseline for pt.  Pt and daughter do not want HHOT at discharge.  See below for any follow-up Occupational Therapy or equipment needs. OT is signing off. Thank you for this referral.      Follow Up Recommendations  No OT follow up;Supervision/Assistance - 24 hour    Equipment Recommendations  None recommended by OT    Recommendations for Other Services       Precautions / Restrictions Precautions Precautions: Fall Required Braces or Orthoses: Sling Restrictions Weight Bearing Restrictions: No (WBing not indicated, but pt encouraged NWB)      Mobility Bed Mobility Overal bed mobility: Needs Assistance Bed Mobility: Rolling;Sidelying to Sit Rolling: Supervision Sidelying to sit: Min assist;HOB elevated       General bed mobility comments: Min assist to boost up from sidelying. Reviewed w/ pt and family that pt should get in/OOB on the Rt side for ease.    Transfers Overall transfer level: Needs assistance Equipment used: Straight cane Transfers: Sit to/from Stand Sit to Stand: Min assist         General  transfer comment: Cues for hand placement and for upright posture.  Assist to steady as pt demonstrates min instability.  Denies dizziness.    Balance Overall balance assessment: Needs assistance Sitting-balance support: No upper extremity supported;Feet supported Sitting balance-Leahy Scale: Fair     Standing balance support: Single extremity supported;During functional activity Standing balance-Leahy Scale: Poor Standing balance comment: Relies on cane and outside support to steady                            ADL Overall ADL's : Needs assistance/impaired Eating/Feeding: Set up;Sitting   Grooming: Wash/dry hands;Wash/dry face;Oral care;Brushing hair;Set up;Sitting   Upper Body Bathing: Minimal assitance;Sitting   Lower Body Bathing: Minimal assistance;Sit to/from stand   Upper Body Dressing : Moderate assistance;Sitting   Lower Body Dressing: Moderate assistance;Sit to/from stand   Toilet Transfer: Minimal assistance;Ambulation;Comfort height toilet;BSC   Toileting- Clothing Manipulation and Hygiene: Moderate assistance;Sit to/from stand       Functional mobility during ADLs: Minimal assistance General ADL Comments: Daughter reports she feels confident with assisting pt with ADLs.  Reinforced need to have assist at all times when pt on her feet.  Daughter verbalizes understanding, pt requires reinforcement      Vision Additional Comments: Loa Socks, pursuits WFL.  Pt reports she broke her glasses when she fell    Perception Perception Perception Tested?: Yes   Praxis Praxis Praxis tested?: Within functional limits    Pertinent Vitals/Pain Pain Assessment: Faces Faces Pain Scale: Hurts little more  Pain Location: Lt UE  Pain Descriptors / Indicators: Aching Pain Intervention(s): Monitored during session     Hand Dominance Right   Extremity/Trunk Assessment Upper Extremity Assessment Upper Extremity Assessment: LUE deficits/detail LUE Deficits /  Details: Long arm splint in place.  Able to move fingers and shoulders WFL    Lower Extremity Assessment Lower Extremity Assessment: Defer to PT evaluation   Cervical / Trunk Assessment Cervical / Trunk Assessment: Normal   Communication Communication Communication: No difficulties   Cognition Arousal/Alertness: Awake/alert Behavior During Therapy: WFL for tasks assessed/performed Overall Cognitive Status: History of cognitive impairments - at baseline Area of Impairment: Memory;Following commands;Safety/judgement;Awareness;Problem solving     Memory: Decreased short-term memory Following Commands: Follows multi-step commands with increased time Safety/Judgement: Decreased awareness of safety;Decreased awareness of deficits Awareness: Emergent Problem Solving: Slow processing;Decreased initiation;Requires verbal cues General Comments: Daughter reports pt is at baseline cognitively    General Comments       Exercises       Shoulder Instructions      Home Living Family/patient expects to be discharged to:: Private residence Living Arrangements: Spouse/significant other;Children Available Help at Discharge: Family;Available 24 hours/day Type of Home: House Home Access: Stairs to enter [employer] of Steps: 2 Entrance Stairs-Rails: Left;Right;Can reach both (grab bars) Home Layout: One level     Bathroom Shower/Tub: Tub/shower unit;Walk-in shower Shower/tub characteristics: Curtain Biochemist, clinical: Handicapped height     Home Equipment: Environmental consultant - 2 wheels;Cane - single point;Shower seat;Grab bars - tub/shower;Bedside commode   Additional Comments: daughter lives w/ pt and pt's husband.  Pt's husband mainly sits or stays in bed and is not very active, requiring assist from daughter for ADLs and ambulation.      Prior Functioning/Environment Level of Independence: Independent with assistive device(s)        Comments: Would use her RW outside when her  knees would hurt (per pt has Bil knee arthritis)    OT Diagnosis: Generalized weakness;Cognitive deficits;Acute pain   OT Problem List: Decreased strength;Decreased activity tolerance;Impaired balance (sitting and/or standing);Decreased cognition;Decreased safety awareness;Decreased knowledge of use of DME or AE;Impaired UE functional use;Pain   OT Treatment/Interventions:      OT Goals(Current goals can be found in the care plan section) Acute Rehab OT Goals Patient Stated Goal: to go home today OT Goal Formulation: All assessment and education complete, DC therapy  OT Frequency:     Barriers to D/C:            Co-evaluation              End of Session Nurse Communication: Mobility status  Activity Tolerance: Patient tolerated treatment well Patient left: in chair;with call bell/phone within reach;with family/visitor present   Time: 1212-1301 OT Time Calculation (min): 49 min Charges:  OT General Charges $OT Visit: 1 Procedure OT Evaluation $OT Eval Moderate Complexity: 1 Procedure OT Treatments $Self Care/Home Management : 8-22 mins G-Codes:    Naaman Curro M 02/18/16, 2:15 PM

## 2016-01-21 NOTE — Progress Notes (Signed)
Patient admitted to floor on via stretcher from ED. Patient alert and oriented x4. Left forehead laceration with steri strips. See echarting for additional data. Patient oriented to floor, room, and call bell.

## 2016-01-21 NOTE — Discharge Summary (Signed)
Physician Discharge Summary  Carolyn [name] MRN: [id number] DOB/AGE: 1933/01/21 82 y.o.  PCP: Lillia Mountain, MD   Admit date: 01/20/2016 Discharge date: 01/21/2016  Discharge Diagnoses:     Principal Problem:   SDH (subdural hematoma) (HCC) Active Problems:   Diabetes mellitus without complication (HCC)   Hypercholesterolemia   COPD (chronic obstructive pulmonary disease) (HCC)   Hypothyroid   Fall   Closed fracture of left elbow   Essential hypertension   Chronic bronchitis (HCC)    Follow-up recommendations Follow-up with PCP in 3-5 days , including all  additional recommended appointments as below Follow-up CBC, CMP in 3-5 days Patient to follow-up with Dr. Verdis Prime for 2-D echo, noted to have bradycardia during this hospitalization      Medication List    STOP taking these medications        theophylline 400 MG 24 hr tablet  Commonly known as:  UNIPHYL      TAKE these medications        Fluticasone-Salmeterol 250-50 MCG/DOSE Aepb  Commonly known as:  ADVAIR  Inhale 1 puff into the lungs 2 (two) times daily as needed (for wheezing/shortness of breath).     glimepiride 2 MG tablet  Commonly known as:  AMARYL  Take 4 mg by mouth at bedtime.     latanoprost 0.005 % ophthalmic solution  Commonly known as:  XALATAN  Place 1 drop into both eyes at bedtime.     lisinopril 20 MG tablet  Commonly known as:  PRINIVIL,ZESTRIL  Take 20 mg by mouth at bedtime.     pirbuterol 200 MCG/INH inhaler  Commonly known as:  MAXAIR  Inhale 2 puffs into the lungs 4 (four) times daily as needed for wheezing or shortness of breath.     polyethylene glycol packet  Commonly known as:  MIRALAX / GLYCOLAX  Take 17 g by mouth daily as needed for mild constipation.     thyroid 60 MG tablet  Commonly known as:  ARMOUR  Take 60 mg by mouth at bedtime.     verapamil 180 MG CR tablet  Commonly known as:  CALAN-SR  Take 180 mg by mouth at bedtime.         Discharge  Condition: *Stable   Discharge Instructions       Discharge Instructions    Diet - low sodium heart healthy    Complete by:  As directed      Increase activity slowly    Complete by:  As directed            Allergies  Allergen Reactions  . Darvocet [Propoxyphene N-Acetaminophen] Nausea And Vomiting  . Metformin And Related Diarrhea  . Percocet [Oxycodone-Acetaminophen] Nausea And Vomiting  . Vicodin [Hydrocodone-Acetaminophen] Itching      Disposition: 02-Short Term Hospital   Consults:  Neurosurgery     Significant Diagnostic Studies:  Dg Elbow Complete Left  01/20/2016  CLINICAL DATA:  Left elbow pain after falling while walking dog today. EXAM: LEFT ELBOW - COMPLETE 3+ VIEW COMPARISON:  None. FINDINGS: There is an acute, mildly displaced intra-articular fracture of the radial head, demonstrating up to 2 mm of impaction of the articular surface. No other acute fractures are seen. There is no dislocation. There is spurring of the coronoid process. There is an elbow joint effusion. IMPRESSION: Mildly impacted intra-articular fracture of the radial head. No dislocation. Electronically Signed   By: Carey Bullocks M.D.   On: 01/20/2016 18:37   Ct Head Wo  Contrast  01/21/2016  CLINICAL DATA:  Follow-up subdural hematoma after a fall yesterday. Headaches. EXAM: CT HEAD WITHOUT CONTRAST TECHNIQUE: Contiguous axial images were obtained from the base of the skull through the vertex without intravenous contrast. COMPARISON:  01/20/2016 FINDINGS: Bilateral subdural hematomas. Left subdural hematoma extends along the frontoparietal and temporal region with maximal depth of about 6 mm. This is about the same as on yesterday's study. There is effacement of the underlying sulci. Right subdural hematoma is along the posterior frontal region with maximal depth of about 5 mm, similar to previous study. No significant sulcal effacement. No midline shift. Ventricles are not dilated nor effaced.  Gray-white matter junctions are distinct. Basal cisterns are not effaced. Mucosal thickening in the paranasal sinuses. Opacification of the frontal sinuses and ethmoid air cells. Retention cysts in the left maxillary antrum and sphenoid sinus. Mastoid air cells are not opacified. IMPRESSION: Bilateral subdural hematomas demonstrated without significant progression since previous study. There is sulcal effacement on the left. No midline shift. Electronically Signed   By: Lucienne Capers M.D.   On: 01/21/2016 06:22   Ct Head Wo Contrast  01/20/2016  CLINICAL DATA:  Headache and left shoulder pain after falling while walking dog today. EXAM: CT HEAD WITHOUT CONTRAST TECHNIQUE: Contiguous axial images were obtained from the base of the skull through the vertex without intravenous contrast. COMPARISON:  None. FINDINGS: Brain: There are small acute subdural hematomas bilaterally. On the right, there is a subdural hematoma measuring up to 8 mm in thickness adjacent to right frontal and temporal lobes. On the left, the subdural hematoma is more extensive, but measures only 5 mm in thickness. No evidence of intraparenchymal hematoma, brain edema, midline shift or hydrocephalus. There is no CT evidence of acute cortical infarction. There are patchy chronic small vessel ischemic changes within the periventricular white matter and basal ganglia bilaterally. Bones/sinuses/visualized face: There is extensive chronic paranasal sinus disease with near complete opacification of the frontal and ethmoid sinuses bilaterally. There are no air-fluid levels. The mastoid air cells and middle ears are clear. The calvarium is intact. IMPRESSION: 1. Acute bilateral subdural hematomas without midline shift or hydrocephalus. 2. Chronic paranasal sinus disease. 3. No acute osseous findings. 4. Critical Value/emergent results were called by telephone at the time of interpretation on 01/20/2016 at 6:51 pm to Dr. Larae Grooms , who verbally  acknowledged these results. Electronically Signed   By: Richardean Sale M.D.   On: 01/20/2016 18:53        Filed Weights   01/21/16 0100  Weight: 114.2 kg (251 lb 12.3 oz)     Microbiology: Recent Results (from the past 240 hour(s))  MRSA PCR Screening     Status: None   Collection Time: 01/21/16  8:14 AM  Result Value Ref Range Status   MRSA by PCR NEGATIVE NEGATIVE Final    Comment:        The GeneXpert MRSA Assay (FDA approved for NASAL specimens only), is one component of a comprehensive MRSA colonization surveillance program. It is not intended to diagnose MRSA infection nor to guide or monitor treatment for MRSA infections.        Blood Culture No results found for: SDES, [place], CULT, REPTSTATUS    Labs: Results for orders placed or performed during the hospital encounter of 01/20/16 (from the past 48 hour(s))  Type and screen [place]     Status: None   Collection Time: 01/20/16 11:23 PM  Result Value Ref Range  ABO/RH(D) O POS    Antibody Screen NEG    Sample Expiration 01/23/2016   ABO/Rh     Status: None   Collection Time: 01/20/16 11:23 PM  Result Value Ref Range   ABO/RH(D) O POS   Comprehensive metabolic panel     Status: Abnormal   Collection Time: 01/21/16  5:11 AM  Result Value Ref Range   Sodium 143 135 - 145 mmol/L   Potassium 4.3 3.5 - 5.1 mmol/L   Chloride 106 101 - 111 mmol/L   CO2 26 22 - 32 mmol/L   Glucose, Bld 148 (H) 65 - 99 mg/dL   BUN 15 6 - 20 mg/dL   Creatinine, Ser 0.73 0.44 - 1.00 mg/dL   Calcium 9.8 8.9 - 10.3 mg/dL   Total Protein 6.0 (L) 6.5 - 8.1 g/dL   Albumin 3.1 (L) 3.5 - 5.0 g/dL   AST 15 15 - 41 U/L   ALT 10 (L) 14 - 54 U/L   Alkaline Phosphatase 71 38 - 126 U/L   Total Bilirubin 0.9 0.3 - 1.2 mg/dL   GFR calc non Af Amer >60 >60 mL/min   GFR calc Af Amer >60 >60 mL/min    Comment: (NOTE) The eGFR has been calculated using the CKD EPI equation. This calculation has not been  validated in all clinical situations. eGFR's persistently <60 mL/min signify possible Chronic Kidney Disease.    Anion gap 11 5 - 15  CBC     Status: Abnormal   Collection Time: 01/21/16  5:11 AM  Result Value Ref Range   WBC 12.6 (H) 4.0 - 10.5 K/uL   RBC 4.57 3.87 - 5.11 MIL/uL   Hemoglobin 12.9 12.0 - 15.0 g/dL   HCT 39.8 36.0 - 46.0 %   MCV 87.1 78.0 - 100.0 fL   MCH 28.2 26.0 - 34.0 pg   MCHC 32.4 30.0 - 36.0 g/dL   RDW 13.9 11.5 - 15.5 %   Platelets 163 150 - 400 K/uL  Lipid panel     Status: None   Collection Time: 01/21/16  5:11 AM  Result Value Ref Range   Cholesterol 155 0 - 200 mg/dL   Triglycerides 50 <150 mg/dL   HDL 59 >40 mg/dL   Total CHOL/HDL Ratio 2.6 RATIO   VLDL 10 0 - 40 mg/dL   LDL Cholesterol 86 0 - 99 mg/dL    Comment:        Total Cholesterol/HDL:CHD Risk Coronary Heart Disease Risk Table                     Men   Women  1/2 Average Risk   3.4   3.3  Average Risk       5.0   4.4  2 X Average Risk   9.6   7.1  3 X Average Risk  23.4   11.0        Use the calculated Patient Ratio above and the CHD Risk Table to determine the patient's CHD Risk.        ATP III CLASSIFICATION (LDL):  <100     mg/dL   Optimal  100-129  mg/dL   Near or Above                    Optimal  130-159  mg/dL   Borderline  160-189  mg/dL   High  >190     mg/dL   Very High   TSH  Status: Abnormal   Collection Time: 01/21/16  5:11 AM  Result Value Ref Range   TSH 0.285 (L) 0.350 - 4.500 uIU/mL  Glucose, capillary     Status: Abnormal   Collection Time: 01/21/16  7:42 AM  Result Value Ref Range   Glucose-Capillary 109 (H) 65 - 99 mg/dL   Comment 1 Notify RN    Comment 2 Document in Chart   MRSA PCR Screening     Status: None   Collection Time: 01/21/16  8:14 AM  Result Value Ref Range   MRSA by PCR NEGATIVE NEGATIVE    Comment:        The GeneXpert MRSA Assay (FDA approved for NASAL specimens only), is one component of a comprehensive MRSA  colonization surveillance program. It is not intended to diagnose MRSA infection nor to guide or monitor treatment for MRSA infections.      Lipid Panel     Component Value Date/Time   CHOL 155 01/21/2016 0511   TRIG 50 01/21/2016 0511   HDL 59 01/21/2016 0511   CHOLHDL 2.6 01/21/2016 0511   VLDL 10 01/21/2016 0511   LDLCALC 86 01/21/2016 0511     No results found for: HGBA1C   Lab Results  Component Value Date   LDLCALC 86 01/21/2016   CREATININE 0.73 01/21/2016     Brief summary Carolyn [name] is a 82 y.o. female with PMH of hypertension, hyperlipidemia, diabetes mellitus, COPD, asthma, glaucoma, chronic kidney disease secondary to right partial nephrectomy due to cancer, who presents with fall, head injury and left elbow pain. Pt reports that she was pulled by her dog when she was walking her dog. She fell hitting head on the concrete causing laceration to left forehead. Pt did not have LOC, but developed headache and left elbow pain. Patient does not have unilateral weakness, numbness or tingling sensations. No vision change or hearing loss. Patient does not have chest pain, shortness breath, abdominal pain or diarrhea. She has nausea, but no vomiting currently. She has chronic poor control of her bladder, which is normal to the patient. No dysuria. No fever or chills. Patient states that she took Aspirin 10 days ago. Currently not on blood thinner. In ED, patient was found to have WBC 15.0, temperature normal, electrolytes and renal function okay, INR 0.96, PTT 27. X-ray of left elbow showed mildly impacted intra-articular fracture of the radial head without dislocation. Ct-head showed small acute subdural hematomas bilaterally. On the right, there is a subdural hematoma measuring up to 8 mm in thickness adjacent to right frontal and temporal lobes. On the left, the subdural hematoma is more extensive, but measures only 5 mm in thickness. No evidence of intraparenchymal  hematoma, brain edema, midline shift or hydrocephalus. There is no CT evidence of acute cortical infarction. Neurosurgery and orthopedic surgery consulted  Assessment and plan  SDH (subdural hematoma) (HCC): Ct-head showed small bilateral SDH without midline shift or hydrocephalus. Neurosurgeon, Dr. Bevely Palmer was consulted by EDP. She has small bilateral subdurals without mass effect. Stable on repeat scan. Okay to discharge from a neurosurgical standpoint   Sinus bradycardia Monitor on telemetry for 24 hours prior to discharge, patient found to have resting bradycardia May need to reduce the dose of verapamil, Patient refused to be monitored overnight on telemetry, refused 2-D echo in the presence of her daughter and requested to be discharged   Left elbow fracture: X-ray of left elbow showed mildly impacted intra-articular fracture of the radial head without dislocation. Pt  has cast. No neurovascular compromise. Discussed with Dr Erlinda Hong on-call He has recommended a sling and outpatient follow-up   DM-II: Last A1c not on record. Patient is taking Amaryl at home Hemoglobin A1c pending  HLD: Last LDL was not on record. Pt is not taking medications at hLDL 86, triglycerides 50   Hypothyroidism: TSH 0.28, check free T4  -Continue home Synthroid Armour   COPD and asthma: stable. No signs of acute exacerbation. -switch home Advair to [facility] inhaler -continue Theophyline and check it level -prn Albuterol nebs  Essential hypertension: -Continue verapamil, lisinopril      Discharge Exam:    Blood pressure 122/64, pulse 55, temperature 97.8 F (36.6 C), temperature source Oral, resp. rate 12, height '5\' 6"'$  (1.676 m), weight 114.2 kg (251 lb 12.3 oz), last menstrual period 03/24/2010, SpO2 95 %.   Pulm: No rales, wheezing, rhonchi or rubs. Abd: Soft, nondistended, nontender, no rebound pain, no organomegaly, BS present. Ext: No pitting leg edema bilaterally. 2+DP/PT pulse  bilaterally. Musculoskeletal: left elbow has cast which is done in [facility]. Skin: No rashes. Has small skin tear over left forehead. Neuro: Alert, oriented X3, cranial nerves II-XII grossly intact, moves all extremities normally. Muscle strength 5/5 in all extremities, sensation to light touch intact.  Psych: Patient is not psychotic, no suicidal or hemocidal ideation.   Follow-up Information    Follow up with Marianna Payment, MD In 1 week.   Specialty:  Orthopedic Surgery   Why:  fracture follow up   Contact information:   [address] [place] [address] [phone]       Follow up with Irven Shelling, MD. Schedule an appointment as soon as possible for a visit in 3 days.   Specialty:  Internal Medicine   Contact information:   [address] [place] [place] [address] [phone]       Follow up with Sinclair Grooms, MD. Schedule an appointment as soon as possible for a visit in 1 week.   Specialty:  Cardiology   Why:  Bradycardia during hospitalization   Contact information:   [address] [place] [address] [phone]       Signed: Reyne Dumas 01/21/2016, 2:11 PM        Time spent >45 mins

## 2016-01-21 NOTE — Consult Note (Signed)
WOC consult requested for left forehead skin tear.  This has already been treated with steristrips in the ER. These should remain in place until they become dry and fall off. No further role for [place]. Please re-consult if further assistance is needed.  Thank-you,  Julien Girt MSN, [place], [place], [place], [place]

## 2016-01-21 NOTE — Care Management Note (Signed)
Case Management Note  Patient Details  Name: Carolyn [name] MRN: [id number] Date of Birth: 1933/11/16  Subjective/Objective:   Date: 01/21/16 Spoke with patient at the bedside along with daughter, [name] [phone].  Introduced self as Tourist information centre manager and explained role in discharge planning and how to be reached.  Verified patient lives in  [place] with daughter, [name].  Has  A cane, a rolling  Walker and a bsc. Expressed potential need for no other DME.  Verified patient anticipates to go home with family, at time of discharge and will have full-time part-time supervision by family  at this time to best of their knowledge.  Patient  denied needing help with their medication.  Patient  is driven by  daughter to MD appointments.  Verified patient has PCP [name].  Per pt eval rec HHPT, patient does not want to this.      CM will continue to follow for discharge planning and [facility] resources.                  Action/Plan:   Expected Discharge Date:  01/21/16               Expected Discharge Plan:  [name]  In-House Referral:     Discharge planning Services  CM Consult  Post Acute Care Choice:  Home Health Choice offered to:  Patient  DME Arranged:    DME Agency:     HH Arranged:  PT, Patient Refused [place] Agency:     Status of Service:  Completed, signed off  Medicare Important Message Given:    Date Medicare IM Given:    Medicare IM give by:    Date Additional Medicare IM Given:    Additional Medicare Important Message give by:     If discussed at [place] of Stay Meetings, dates discussed:    Additional Comments:  Zenon Mayo, RN 01/21/2016, 11:40 AM

## 2016-01-21 NOTE — Evaluation (Signed)
Physical Therapy Evaluation Patient Details Name: Carolyn [name] MRN: [id number] DOB: [birth year] Today's Date: 01/21/2016   History of Present Illness  Pt is a 82 y/o F who fell because of being pulled while walking her dog.  Pt w/ resultant small Bil SDH and Lt radial head fx casted and in sling.  Pt's PMH includes DM, glaucoma, cataracts Bil, COPD, CKD, Bil knee arthiritis (per pt), cholecystectomy.  Clinical Impression  Pt admitted with above diagnosis. Pt currently with functional limitations due to the deficits listed below (see PT Problem List). Carolyn [name] presents w/ impaired balance and decreased awareness of her deficits.  PTA pt was independent and would use RW only when having knee pain.  She will have 24/7 assist from her daughter who is an Therapist, sports.  Completed stair training and ambulated 100 ft in hallway using cane w/ min assist. Pt will benefit from skilled PT to increase their independence and safety with mobility to allow discharge to the venue listed below.      Follow Up Recommendations Home health PT;Supervision/Assistance - 24 hour    Equipment Recommendations  None recommended by PT    Recommendations for Other Services OT consult     Precautions / Restrictions Precautions Precautions: Fall Required Braces or Orthoses: Sling (at all times) Restrictions Weight Bearing Restrictions:  (Not specified but followed NWB Lt UE)      Mobility  Bed Mobility Overal bed mobility: Needs Assistance Bed Mobility: Rolling;Sidelying to Sit Rolling: Supervision Sidelying to sit: Min assist;HOB elevated       General bed mobility comments: Min assist to boost up from sidelying. Reviewed w/ pt and family that pt should get in/OOB on the Rt side for ease.    Transfers Overall transfer level: Needs assistance Equipment used: Straight cane Transfers: Sit to/from Stand Sit to Stand: Min assist         General transfer comment: Cues for hand placement and for upright posture.   Assist to steady as pt demonstrates min instability.  Denies dizziness.  Ambulation/Gait Ambulation/Gait assistance: Min assist Ambulation Distance (Feet): 100 Feet Assistive device: Straight cane Gait Pattern/deviations: Antalgic;Drifts right/left;Decreased stride length   Gait velocity interpretation: Below normal speed for age/gender General Gait Details: Pt drifts Lt and Rt and is unsteady, requiring min assist at times to steady.    Stairs Stairs: Yes Stairs assistance: Min assist Stair Management: One rail Right;Step to pattern;Forwards Number of Stairs: 2 General stair comments: Cues for hand placement and min assist to steady.  Cues for sequencing.  Wheelchair Mobility    Modified Rankin (Stroke Patients Only)       Balance Overall balance assessment: Needs assistance Sitting-balance support: No upper extremity supported;Feet supported Sitting balance-Leahy Scale: Fair     Standing balance support: Single extremity supported;During functional activity Standing balance-Leahy Scale: Poor Standing balance comment: Relies on cane and outside support to steady                             Pertinent Vitals/Pain Pain Assessment: Faces Faces Pain Scale: Hurts even more Pain Location: Lt UE and headache Pain Descriptors / Indicators: Aching;Headache;Grimacing Pain Intervention(s): Limited activity within patient's tolerance;Monitored during session;Repositioned    Home Living Family/patient expects to be discharged to:: Private residence Living Arrangements: Spouse/significant other;Children Available Help at Discharge: Family;Available 24 hours/day Type of Home: House Home Access: Stairs to enter Entrance Stairs-Rails: Left;Right;Can reach both (grab bars) Entrance Stairs-Number of Steps: 2 Home  Layout: One level Home Equipment: Walker - 2 wheels;Kasandra Knudsen - single point Additional Comments: daughter lives w/ pt and pt's husband.  Pt's husband mainly sits or  stays in bed and is not very active, requiring assist from daughter for ADLs and ambulation.    Prior Function Level of Independence: Independent with assistive device(s)         Comments: Would use her RW outside when her knees would hurt (per pt has Bil knee arthritis)     Hand Dominance        Extremity/Trunk Assessment   Upper Extremity Assessment: Defer to OT evaluation           Lower Extremity Assessment: Overall WFL for tasks assessed         Communication   Communication: No difficulties  Cognition Arousal/Alertness: Awake/alert Behavior During Therapy: Flat affect Overall Cognitive Status: Impaired/Different from baseline Area of Impairment: Memory;Following commands;Safety/judgement;Awareness;Problem solving     Memory: Decreased recall of precautions;Decreased short-term memory Following Commands: Follows multi-step commands with increased time Safety/Judgement: Decreased awareness of safety;Decreased awareness of deficits Awareness: Emergent Problem Solving: Slow processing;Decreased initiation;Requires verbal cues General Comments: After discussion during session about NWB Lt UE and need for cane pt reports that she does not need therapy and that her balance is fine, that she will use her RW at home.  Seems unaware of new deficits and impaired balance and safety.    General Comments General comments (skin integrity, edema, etc.): Encouraged daughter to acquire gait belt for safety assisting pt and pt's husband at home. Daughter appreciative.    Exercises        Assessment/Plan    PT Assessment Patient needs continued PT services  PT Diagnosis Difficulty walking;Abnormality of gait;Acute pain   PT Problem List Decreased strength;Decreased range of motion;Decreased activity tolerance;Decreased balance;Decreased mobility;Decreased cognition;Decreased knowledge of use of DME;Decreased safety awareness;Decreased knowledge of precautions;Pain  PT  Treatment Interventions DME instruction;Gait training;Stair training;Functional mobility training;Therapeutic activities;Therapeutic exercise;Balance training;Neuromuscular re-education;Patient/family education   PT Goals (Current goals can be found in the Care Plan section) Acute Rehab PT Goals Patient Stated Goal: to go home today PT Goal Formulation: With patient/family Time For Goal Achievement: 01/31/16 Potential to Achieve Goals: Good    Frequency Min 3X/week   Barriers to discharge Inaccessible home environment steps to enter home    Co-evaluation               End of Session Equipment Utilized During Treatment: Gait belt Activity Tolerance: Patient limited by fatigue Patient left: in chair;with call bell/phone within reach;with family/visitor present Nurse Communication: Mobility status;Weight bearing status         Time: [id number] PT Time Calculation (min) (ACUTE ONLY): 32 min   Charges:   PT Evaluation $PT Eval Moderate Complexity: 1 Procedure PT Treatments $Gait Training: 8-22 mins   PT G Codes:       Collie Siad PT, DPT  Pager: [phone] Phone: [phone] 01/21/2016, 11:17 AM

## 2016-01-21 NOTE — Progress Notes (Signed)
PT Cancellation Note  Patient Details Name: Carolyn [name] MRN: [id number] DOB: 10-Apr-1933   Cancelled Treatment:    Reason Eval/Treat Not Completed: Other (comment) (Pt on bed pan and requests that PT return later).  PT will attempt to see pt again later today, schedule permitting.  Collie Siad PT, DPT  Pager: [phone] Phone: [phone] 01/21/2016, 10:14 AM

## 2016-01-21 NOTE — Progress Notes (Signed)
Orthopedic Tech Progress Note Patient Details:  Carolyn [name] 03-03-1933 [id number]  Ortho Devices Type of Ortho Device: Sling immobilizer Ortho Device/Splint Interventions: Application   [place] Pink 01/21/2016, 9:09 AM

## 2016-01-21 NOTE — Progress Notes (Signed)
Dr. Allyson Sabal states, "ok for pt to dc home"

## 2016-01-21 NOTE — Consult Note (Signed)
CC:  Chief Complaint  Patient presents with  . Head Injury    HPI: Carolyn [name] is a 82 y.o. female who sustained fall from ground level yesterday.  She struck her head just above her left eye.  She denies LOC.  Complains of mild headache.  No seizure activity.  She is not on blood thinners.  PMH: Past Medical History  Diagnosis Date  . Diabetes mellitus   . Hypertension   . Hypercholesterolemia   . Gallstones   . Glaucoma   . Cataracts, bilateral   . COPD (chronic obstructive pulmonary disease) ([place])   . Hypothyroid   . PONV (postoperative nausea and vomiting)   . Asthma   . Chronic kidney disease 2007    kidney cancer and partial nephrctomy-right    PSH: Past Surgical History  Procedure Laterality Date  . Dilation and curettage of uterus    . Partial nephrectomy      cancer  . Breast biopsy    . D&c for enlarged uterine  210  . Total abdominal hysterectomy w/ bilateral salpingoophorectomy  2011  . Cateract extractions and iol    . Cholecystectomy  2008  . Abdominal hysterectomy    . Eye surgery  08/2011,09/2012    bilateral cataracts  . Hernia repair  01/19/12    inguinal  . Inguinal hernia repair  01/19/2012    Procedure: HERNIA REPAIR INGUINAL ADULT;  Surgeon: Earnstine Regal, MD;  Location: [facility];  Service: General;  Laterality: Left;  repair left inguinal hernia with mesh     SH: Social History  Substance Use Topics  . Smoking status: Former Smoker -- 1.00 packs/day for 5 years    Types: Cigarettes    Quit date: 01/11/1982  . Smokeless tobacco: Never Used     Comment: quit maybe 102yrs  . Alcohol Use: No    MEDS: Prior to Admission medications   Medication Sig Start Date End Date Taking? Authorizing Provider  Fluticasone-Salmeterol (ADVAIR) 250-50 MCG/DOSE AEPB Inhale 1 puff into the lungs 2 (two) times daily as needed (for wheezing/shortness of breath).    Yes Historical Provider, MD  glimepiride (AMARYL) 2 MG tablet Take 4 mg by mouth at bedtime.     Yes Historical Provider, MD  latanoprost (XALATAN) 0.005 % ophthalmic solution Place 1 drop into both eyes at bedtime.    Yes Historical Provider, MD  lisinopril (PRINIVIL,ZESTRIL) 20 MG tablet Take 20 mg by mouth at bedtime.    Yes Historical Provider, MD  pirbuterol (MAXAIR) 200 MCG/INH inhaler Inhale 2 puffs into the lungs 4 (four) times daily as needed for wheezing or shortness of breath.    Yes Historical Provider, MD  polyethylene glycol (MIRALAX / GLYCOLAX) packet Take 17 g by mouth daily as needed for mild constipation.    Yes Historical Provider, MD  theophylline (UNIPHYL) 400 MG 24 hr tablet Take 400 mg by mouth at bedtime.   Yes Historical Provider, MD  thyroid (ARMOUR) 60 MG tablet Take 60 mg by mouth at bedtime.    Yes Historical Provider, MD  verapamil (CALAN-SR) 180 MG CR tablet Take 180 mg by mouth at bedtime.   Yes Historical Provider, MD    ALLERGY: Allergies  Allergen Reactions  . Darvocet [Propoxyphene N-Acetaminophen] Nausea And Vomiting  . Metformin And Related Diarrhea  . Percocet [Oxycodone-Acetaminophen] Nausea And Vomiting  . Vicodin [Hydrocodone-Acetaminophen] Itching    ROS: Review of Systems  Constitutional: Negative for fever and chills.  Eyes: Negative.  Respiratory: Negative.   Cardiovascular: Negative.   Gastrointestinal: Negative.   Musculoskeletal: Positive for falls.  Skin: Negative for itching and rash.  Neurological: Positive for headaches. Negative for dizziness, tingling, tremors, sensory change, speech change, focal weakness, seizures and loss of consciousness.  Endo/Heme/Allergies: Negative.   Psychiatric/Behavioral: Negative.     NEUROLOGIC EXAM: Awake, alert, oriented Memory and concentration grossly intact Speech fluent, appropriate CN grossly intact Motor exam: Upper Extremities Deltoid Bicep Tricep Grip  Right 5/5 5/5 5/5 5/5  Left 5/5 5/5 5/5 5/5   Lower Extremity IP Quad PF DF EHL  Right 5/5 5/5 5/5 5/5 5/5  Left 5/5 5/5  5/5 5/5 5/5   Sensation grossly intact to LT  IMAGING: I have independently reviewed her head CT scans.  She has small bilateral subdurals without mass effect.  Stable on repeat scan.  IMPRESSION: - 82 y.o. female with mild traumatic brain injury, small subdural hematomas.  Stable on repeat scan.  PLAN: - Discharge home when she clears therapy - Follow up with me in one month with a non-contrasted head CT

## 2016-01-22 LAB — HEMOGLOBIN A1C
Hgb A1c MFr Bld: 6.7 % — ABNORMAL HIGH (ref 4.8–5.6)
Mean Plasma Glucose: 146 mg/dL

## 2016-01-22 NOTE — Care Management Important Message (Signed)
Important Message  Patient Details  Name: Carolyn [name] MRN: [id number] Date of Birth: 1933/03/06   Medicare Important Message Given:  Yes    Stamatia Masri P [place] 01/22/2016, 1:27 PM

## 2016-01-23 DIAGNOSIS — Z23 Encounter for immunization: Secondary | ICD-10-CM | POA: Diagnosis not present

## 2016-01-23 DIAGNOSIS — D72829 Elevated white blood cell count, unspecified: Secondary | ICD-10-CM | POA: Diagnosis not present

## 2016-01-23 DIAGNOSIS — I6200 Nontraumatic subdural hemorrhage, unspecified: Secondary | ICD-10-CM | POA: Diagnosis not present

## 2016-01-23 DIAGNOSIS — M25512 Pain in left shoulder: Secondary | ICD-10-CM | POA: Diagnosis not present

## 2016-01-23 DIAGNOSIS — S52122A Displaced fracture of head of left radius, initial encounter for closed fracture: Secondary | ICD-10-CM | POA: Diagnosis not present

## 2016-01-23 DIAGNOSIS — S42402A Unspecified fracture of lower end of left humerus, initial encounter for closed fracture: Secondary | ICD-10-CM | POA: Diagnosis not present

## 2016-01-23 DIAGNOSIS — R001 Bradycardia, unspecified: Secondary | ICD-10-CM | POA: Diagnosis not present

## 2016-01-28 ENCOUNTER — Other Ambulatory Visit: Payer: Self-pay | Admitting: Neurological Surgery

## 2016-01-28 DIAGNOSIS — S065XAA Traumatic subdural hemorrhage with loss of consciousness status unknown, initial encounter: Secondary | ICD-10-CM

## 2016-01-28 DIAGNOSIS — S065X9A Traumatic subdural hemorrhage with loss of consciousness of unspecified duration, initial encounter: Secondary | ICD-10-CM

## 2016-01-30 ENCOUNTER — Other Ambulatory Visit: Payer: PPO

## 2016-01-30 DIAGNOSIS — S52122D Displaced fracture of head of left radius, subsequent encounter for closed fracture with routine healing: Secondary | ICD-10-CM | POA: Diagnosis not present

## 2016-02-04 DIAGNOSIS — G44309 Post-traumatic headache, unspecified, not intractable: Secondary | ICD-10-CM | POA: Diagnosis not present

## 2016-02-04 DIAGNOSIS — I6200 Nontraumatic subdural hemorrhage, unspecified: Secondary | ICD-10-CM | POA: Diagnosis not present

## 2016-02-05 ENCOUNTER — Other Ambulatory Visit: Payer: Self-pay | Admitting: Neurological Surgery

## 2016-02-05 ENCOUNTER — Inpatient Hospital Stay (HOSPITAL_COMMUNITY)
Admission: EM | Admit: 2016-02-05 | Discharge: 2016-02-12 | DRG: 026 | Disposition: A | Discharge status: Rehabilitation Facility | Payer: PPO | Attending: Neurological Surgery | Admitting: Neurological Surgery

## 2016-02-05 ENCOUNTER — Emergency Department (HOSPITAL_COMMUNITY): Payer: PPO

## 2016-02-05 ENCOUNTER — Encounter (HOSPITAL_COMMUNITY): Payer: Self-pay | Admitting: Emergency Medicine

## 2016-02-05 DIAGNOSIS — N189 Chronic kidney disease, unspecified: Secondary | ICD-10-CM | POA: Diagnosis not present

## 2016-02-05 DIAGNOSIS — J449 Chronic obstructive pulmonary disease, unspecified: Secondary | ICD-10-CM | POA: Diagnosis not present

## 2016-02-05 DIAGNOSIS — S065XAA Traumatic subdural hemorrhage with loss of consciousness status unknown, initial encounter: Secondary | ICD-10-CM | POA: Diagnosis present

## 2016-02-05 DIAGNOSIS — S065X0A Traumatic subdural hemorrhage without loss of consciousness, initial encounter: Secondary | ICD-10-CM | POA: Diagnosis present

## 2016-02-05 DIAGNOSIS — G44309 Post-traumatic headache, unspecified, not intractable: Secondary | ICD-10-CM | POA: Diagnosis not present

## 2016-02-05 DIAGNOSIS — Z9841 Cataract extraction status, right eye: Secondary | ICD-10-CM | POA: Diagnosis not present

## 2016-02-05 DIAGNOSIS — S065X9A Traumatic subdural hemorrhage with loss of consciousness of unspecified duration, initial encounter: Secondary | ICD-10-CM

## 2016-02-05 DIAGNOSIS — E1142 Type 2 diabetes mellitus with diabetic polyneuropathy: Secondary | ICD-10-CM | POA: Diagnosis not present

## 2016-02-05 DIAGNOSIS — S069X9A Unspecified intracranial injury with loss of consciousness of unspecified duration, initial encounter: Secondary | ICD-10-CM | POA: Insufficient documentation

## 2016-02-05 DIAGNOSIS — R1310 Dysphagia, unspecified: Secondary | ICD-10-CM | POA: Diagnosis not present

## 2016-02-05 DIAGNOSIS — F068 Other specified mental disorders due to known physiological condition: Secondary | ICD-10-CM | POA: Insufficient documentation

## 2016-02-05 DIAGNOSIS — I2789 Other specified pulmonary heart diseases: Secondary | ICD-10-CM | POA: Diagnosis not present

## 2016-02-05 DIAGNOSIS — E1122 Type 2 diabetes mellitus with diabetic chronic kidney disease: Secondary | ICD-10-CM | POA: Diagnosis present

## 2016-02-05 DIAGNOSIS — S069X9D Unspecified intracranial injury with loss of consciousness of unspecified duration, subsequent encounter: Secondary | ICD-10-CM | POA: Diagnosis not present

## 2016-02-05 DIAGNOSIS — I493 Ventricular premature depolarization: Secondary | ICD-10-CM | POA: Diagnosis not present

## 2016-02-05 DIAGNOSIS — R569 Unspecified convulsions: Secondary | ICD-10-CM | POA: Diagnosis not present

## 2016-02-05 DIAGNOSIS — R4702 Dysphasia: Secondary | ICD-10-CM | POA: Diagnosis present

## 2016-02-05 DIAGNOSIS — F918 Other conduct disorders: Secondary | ICD-10-CM | POA: Diagnosis not present

## 2016-02-05 DIAGNOSIS — R296 Repeated falls: Secondary | ICD-10-CM | POA: Insufficient documentation

## 2016-02-05 DIAGNOSIS — W1830XA Fall on same level, unspecified, initial encounter: Secondary | ICD-10-CM | POA: Diagnosis not present

## 2016-02-05 DIAGNOSIS — Z7984 Long term (current) use of oral hypoglycemic drugs: Secondary | ICD-10-CM

## 2016-02-05 DIAGNOSIS — K5900 Constipation, unspecified: Secondary | ICD-10-CM | POA: Diagnosis present

## 2016-02-05 DIAGNOSIS — J9601 Acute respiratory failure with hypoxia: Secondary | ICD-10-CM | POA: Diagnosis not present

## 2016-02-05 DIAGNOSIS — I129 Hypertensive chronic kidney disease with stage 1 through stage 4 chronic kidney disease, or unspecified chronic kidney disease: Secondary | ICD-10-CM | POA: Diagnosis present

## 2016-02-05 DIAGNOSIS — E039 Hypothyroidism, unspecified: Secondary | ICD-10-CM | POA: Diagnosis present

## 2016-02-05 DIAGNOSIS — E119 Type 2 diabetes mellitus without complications: Secondary | ICD-10-CM | POA: Diagnosis not present

## 2016-02-05 DIAGNOSIS — R000 Tachycardia, unspecified: Secondary | ICD-10-CM | POA: Insufficient documentation

## 2016-02-05 DIAGNOSIS — Z85528 Personal history of other malignant neoplasm of kidney: Secondary | ICD-10-CM | POA: Diagnosis not present

## 2016-02-05 DIAGNOSIS — H538 Other visual disturbances: Secondary | ICD-10-CM

## 2016-02-05 DIAGNOSIS — Z9842 Cataract extraction status, left eye: Secondary | ICD-10-CM | POA: Diagnosis not present

## 2016-02-05 DIAGNOSIS — E7800 Pure hypercholesterolemia, unspecified: Secondary | ICD-10-CM | POA: Diagnosis not present

## 2016-02-05 DIAGNOSIS — J438 Other emphysema: Secondary | ICD-10-CM | POA: Diagnosis not present

## 2016-02-05 DIAGNOSIS — G4089 Other seizures: Secondary | ICD-10-CM | POA: Diagnosis not present

## 2016-02-05 DIAGNOSIS — Z79899 Other long term (current) drug therapy: Secondary | ICD-10-CM

## 2016-02-05 DIAGNOSIS — S069X0S Unspecified intracranial injury without loss of consciousness, sequela: Secondary | ICD-10-CM

## 2016-02-05 DIAGNOSIS — S0990XS Unspecified injury of head, sequela: Secondary | ICD-10-CM | POA: Diagnosis not present

## 2016-02-05 DIAGNOSIS — N390 Urinary tract infection, site not specified: Secondary | ICD-10-CM | POA: Diagnosis not present

## 2016-02-05 DIAGNOSIS — I10 Essential (primary) hypertension: Secondary | ICD-10-CM | POA: Diagnosis not present

## 2016-02-05 DIAGNOSIS — H409 Unspecified glaucoma: Secondary | ICD-10-CM | POA: Diagnosis present

## 2016-02-05 DIAGNOSIS — Z87891 Personal history of nicotine dependence: Secondary | ICD-10-CM

## 2016-02-05 DIAGNOSIS — E118 Type 2 diabetes mellitus with unspecified complications: Secondary | ICD-10-CM | POA: Insufficient documentation

## 2016-02-05 DIAGNOSIS — S065X0S Traumatic subdural hemorrhage without loss of consciousness, sequela: Secondary | ICD-10-CM | POA: Diagnosis not present

## 2016-02-05 DIAGNOSIS — S065X3S Traumatic subdural hemorrhage with loss of consciousness of 1 hour to 5 hours 59 minutes, sequela: Secondary | ICD-10-CM | POA: Diagnosis not present

## 2016-02-05 DIAGNOSIS — J9811 Atelectasis: Secondary | ICD-10-CM | POA: Diagnosis not present

## 2016-02-05 DIAGNOSIS — S065X2S Traumatic subdural hemorrhage with loss of consciousness of 31 minutes to 59 minutes, sequela: Secondary | ICD-10-CM | POA: Diagnosis not present

## 2016-02-05 DIAGNOSIS — I6200 Nontraumatic subdural hemorrhage, unspecified: Secondary | ICD-10-CM | POA: Diagnosis not present

## 2016-02-05 DIAGNOSIS — S069X9S Unspecified intracranial injury with loss of consciousness of unspecified duration, sequela: Secondary | ICD-10-CM | POA: Diagnosis not present

## 2016-02-05 DIAGNOSIS — F09 Unspecified mental disorder due to known physiological condition: Secondary | ICD-10-CM | POA: Diagnosis not present

## 2016-02-05 DIAGNOSIS — S069XAA Unspecified intracranial injury with loss of consciousness status unknown, initial encounter: Secondary | ICD-10-CM | POA: Insufficient documentation

## 2016-02-05 DIAGNOSIS — R51 Headache: Secondary | ICD-10-CM | POA: Diagnosis not present

## 2016-02-05 HISTORY — DX: Nontraumatic subdural hemorrhage, unspecified: I62.00

## 2016-02-05 LAB — DIFFERENTIAL
Basophils Absolute: 0 10*3/uL (ref 0.0–0.1)
Basophils Relative: 0 %
Eosinophils Absolute: 0.3 10*3/uL (ref 0.0–0.7)
Eosinophils Relative: 3 %
Lymphocytes Relative: 28 %
Lymphs Abs: 2.8 10*3/uL (ref 0.7–4.0)
Monocytes Absolute: 0.6 10*3/uL (ref 0.1–1.0)
Monocytes Relative: 6 %
Neutro Abs: 6.2 10*3/uL (ref 1.7–7.7)
Neutrophils Relative %: 63 %

## 2016-02-05 LAB — CBC
HCT: 43.1 % (ref 36.0–46.0)
Hemoglobin: 14.8 g/dL (ref 12.0–15.0)
MCH: 29.8 pg (ref 26.0–34.0)
MCHC: 34.3 g/dL (ref 30.0–36.0)
MCV: 86.9 fL (ref 78.0–100.0)
Platelets: 216 10*3/uL (ref 150–400)
RBC: 4.96 MIL/uL (ref 3.87–5.11)
RDW: 13.8 % (ref 11.5–15.5)
WBC: 10 10*3/uL (ref 4.0–10.5)

## 2016-02-05 LAB — COMPREHENSIVE METABOLIC PANEL
ALT: 11 U/L — ABNORMAL LOW (ref 14–54)
AST: 22 U/L (ref 15–41)
Albumin: 3.6 g/dL (ref 3.5–5.0)
Alkaline Phosphatase: 85 U/L (ref 38–126)
Anion gap: 13 (ref 5–15)
BUN: 14 mg/dL (ref 6–20)
CO2: 27 mmol/L (ref 22–32)
Calcium: 10.5 mg/dL — ABNORMAL HIGH (ref 8.9–10.3)
Chloride: 98 mmol/L — ABNORMAL LOW (ref 101–111)
Creatinine, Ser: 0.79 mg/dL (ref 0.44–1.00)
GFR calc Af Amer: >60 mL/min (ref >60)
GFR calc non Af Amer: >60 mL/min (ref >60)
Glucose, Bld: 159 mg/dL — ABNORMAL HIGH (ref 65–99)
Potassium: 4.4 mmol/L (ref 3.5–5.1)
Sodium: 138 mmol/L (ref 135–145)
Total Bilirubin: 1.7 mg/dL — ABNORMAL HIGH (ref 0.3–1.2)
Total Protein: 6.9 g/dL (ref 6.5–8.1)

## 2016-02-05 LAB — URINALYSIS, ROUTINE W REFLEX MICROSCOPIC
Bilirubin Urine: NEGATIVE
Glucose, UA: NEGATIVE mg/dL
Hgb urine dipstick: NEGATIVE
Ketones, ur: 15 mg/dL — AB
Nitrite: NEGATIVE
Protein, ur: NEGATIVE mg/dL
Specific Gravity, Urine: 1.019 (ref 1.005–1.030)
pH: 7 (ref 5.0–8.0)

## 2016-02-05 LAB — RAPID URINE DRUG SCREEN, HOSP PERFORMED
Amphetamines: NOT DETECTED
Barbiturates: NOT DETECTED
Benzodiazepines: NOT DETECTED
Cocaine: NOT DETECTED
Opiates: NOT DETECTED
Tetrahydrocannabinol: NOT DETECTED

## 2016-02-05 LAB — MRSA PCR SCREENING: MRSA by PCR: NEGATIVE

## 2016-02-05 LAB — URINE MICROSCOPIC-ADD ON

## 2016-02-05 LAB — I-STAT TROPONIN, ED: Troponin i, poc: 0 ng/mL (ref 0.00–0.08)

## 2016-02-05 LAB — GLUCOSE, CAPILLARY: Glucose-Capillary: 142 mg/dL — ABNORMAL HIGH (ref 65–99)

## 2016-02-05 LAB — PROTIME-INR
INR: 0.95 (ref 0.00–1.49)
Prothrombin Time: 12.9 seconds (ref 11.6–15.2)

## 2016-02-05 LAB — APTT: aPTT: 30 seconds (ref 24–37)

## 2016-02-05 MED ORDER — LISINOPRIL 20 MG PO TABS
20.0000 mg | ORAL_TABLET | Freq: Every day | ORAL | Status: DC
  Administered 2016-02-05 – 2016-02-11 (×7): 20 mg via ORAL
  Filled 2016-02-05 (×7): qty 1

## 2016-02-05 MED ORDER — LATANOPROST 0.005 % OP SOLN
1.0000 [drp] | Freq: Every day | OPHTHALMIC | Status: DC
  Administered 2016-02-05 – 2016-02-11 (×6): 1 [drp] via OPHTHALMIC
  Filled 2016-02-05 (×2): qty 2.5

## 2016-02-05 MED ORDER — POLYETHYLENE GLYCOL 3350 17 G PO PACK: 17.0000 g | PACK | Freq: Every day | ORAL | Status: DC | PRN

## 2016-02-05 MED ORDER — SODIUM CHLORIDE 0.9 % IV SOLN
1000.0000 mg | Freq: Once | INTRAVENOUS | Status: AC
  Filled 2016-02-05: qty 10

## 2016-02-05 MED ORDER — THYROID 60 MG PO TABS
60.0000 mg | ORAL_TABLET | Freq: Every day | ORAL | Status: DC
  Administered 2016-02-05 – 2016-02-11 (×7): 60 mg via ORAL
  Filled 2016-02-05 (×8): qty 1

## 2016-02-05 MED ORDER — SODIUM CHLORIDE 0.9 % IV SOLN
750.0000 mg | Freq: Two times a day (BID) | INTRAVENOUS | Status: DC
  Administered 2016-02-05 – 2016-02-10 (×10): 750 mg via INTRAVENOUS
  Filled 2016-02-05 (×13): qty 7.5

## 2016-02-05 MED ORDER — ALBUTEROL SULFATE (2.5 MG/3ML) 0.083% IN NEBU: 2.5000 mg | INHALATION_SOLUTION | RESPIRATORY_TRACT | Status: DC | PRN

## 2016-02-05 MED ORDER — VERAPAMIL HCL ER 180 MG PO TBCR
180.0000 mg | EXTENDED_RELEASE_TABLET | Freq: Every day | ORAL | Status: DC
  Administered 2016-02-05 – 2016-02-11 (×7): 180 mg via ORAL
  Filled 2016-02-05 (×8): qty 1

## 2016-02-05 MED ORDER — MOMETASONE FURO-FORMOTEROL FUM 200-5 MCG/ACT IN AERO
2.0000 | INHALATION_SPRAY | Freq: Two times a day (BID) | RESPIRATORY_TRACT | Status: DC
  Filled 2016-02-05 (×2): qty 8.8

## 2016-02-05 MED ORDER — SODIUM CHLORIDE 0.9 % IV BOLUS (SEPSIS)
500.0000 mL | Freq: Once | INTRAVENOUS | Status: AC
  Administered 2016-02-05: 500 mL via INTRAVENOUS

## 2016-02-05 MED ORDER — ACETAMINOPHEN 650 MG RE SUPP: 650.0000 mg | RECTAL | Status: DC | PRN

## 2016-02-05 MED ORDER — ONDANSETRON HCL 4 MG/2ML IJ SOLN
4.0000 mg | Freq: Once | INTRAMUSCULAR | Status: AC
  Administered 2016-02-05: 4 mg via INTRAVENOUS
  Filled 2016-02-05: qty 2

## 2016-02-05 MED ORDER — STROKE: EARLY STAGES OF RECOVERY BOOK
Freq: Once | Status: AC
  Administered 2016-02-05: 22:00:00
  Filled 2016-02-05: qty 1

## 2016-02-05 MED ORDER — CEFAZOLIN SODIUM-DEXTROSE 2-4 GM/100ML-% IV SOLN
2.0000 g | INTRAVENOUS | Status: DC
  Filled 2016-02-05: qty 100

## 2016-02-05 MED ORDER — ACETAMINOPHEN 325 MG PO TABS
650.0000 mg | ORAL_TABLET | ORAL | Status: DC | PRN
  Administered 2016-02-07 – 2016-02-08 (×4): 650 mg via ORAL
  Administered 2016-02-10: 325 mg via ORAL
  Administered 2016-02-10: 650 mg via ORAL
  Filled 2016-02-05 (×6): qty 2

## 2016-02-05 MED ORDER — PANTOPRAZOLE SODIUM 40 MG IV SOLR
40.0000 mg | Freq: Every day | INTRAVENOUS | Status: DC
  Administered 2016-02-05: 40 mg via INTRAVENOUS
  Filled 2016-02-05: qty 40

## 2016-02-05 MED ORDER — SENNOSIDES-DOCUSATE SODIUM 8.6-50 MG PO TABS
1.0000 | ORAL_TABLET | Freq: Two times a day (BID) | ORAL | Status: DC
  Administered 2016-02-05: 1 via ORAL
  Filled 2016-02-05: qty 1

## 2016-02-05 MED ORDER — THEOPHYLLINE ER 400 MG PO TB24
400.0000 mg | ORAL_TABLET | Freq: Every day | ORAL | Status: DC
  Administered 2016-02-05 – 2016-02-11 (×7): 400 mg via ORAL
  Filled 2016-02-05 (×8): qty 1

## 2016-02-05 MED ORDER — HYDROMORPHONE HCL 1 MG/ML IJ SOLN
0.5000 mg | INTRAMUSCULAR | Status: DC | PRN
  Administered 2016-02-06: 0.5 mg via INTRAVENOUS
  Filled 2016-02-05: qty 1

## 2016-02-05 MED ORDER — GLIMEPIRIDE 4 MG PO TABS
4.0000 mg | ORAL_TABLET | Freq: Every day | ORAL | Status: DC
  Administered 2016-02-07 – 2016-02-12 (×6): 4 mg via ORAL
  Filled 2016-02-05 (×8): qty 1

## 2016-02-05 MED ORDER — LABETALOL HCL 5 MG/ML IV SOLN
10.0000 mg | INTRAVENOUS | Status: DC | PRN
Start: 2016-02-05 — End: 2016-02-12

## 2016-02-05 NOTE — ED Notes (Signed)
Patient was Dx with a Subdural on March 6th. Patient family states yesterday she started vomiting and complaining of headache. Patient family states Patient was at baseline Alert and orinted x4 at 1400. Patient family states when she came home at 21 patient has slurred speech unable to recall things she would know at baseline. PA and MD aware at bedside. Patient grips strong and equal bilateral. Patient able tell RN Her name location, and time.

## 2016-02-05 NOTE — ED Notes (Signed)
Patient transported to CT 

## 2016-02-05 NOTE — ED Provider Notes (Signed)
CSN: [id number]     Arrival date & time 02/05/16  1820 History   First MD Initiated Contact with Patient 02/05/16 1825     Chief Complaint  Patient presents with  . Headache  . Emesis     (Consider location/radiation/quality/duration/timing/severity/associated sxs/prior Treatment) HPI Comments: Patient with recent admission of the hospital for subdural hematoma after a fall, presents today with acute confusion, difficulty speaking, slurred speech. Patient's daughter was at home with patient and she was normal when she laughs at approximately 2 PM. Upon returning at 4:30 PM, patient was confused. She did not remember birthdays and phone numbers. She had difficulty saying the word food. No facial droop, unilateral weakness or dizziness. Symptoms essentially resolved while driving to the hospital. Patient currently only complains of feeling generally weak. Vomited en route to hospital. She thinks that she is dehydrated. Patient has been having headaches, thought to be related to her subdural hematoma. Patient was prescribed tramadol by her primary care physician yesterday. Patient took 2 doses yesterday and one dose this morning. She slept almost entirely last night per the daughter. Headache is actually better today. No seizure-like activity. The onset of this condition was acute. Aggravating factors: none. Alleviating factors: none.    The history is provided by the patient.    Past Medical History  Diagnosis Date  . Diabetes mellitus   . Hypertension   . Hypercholesterolemia   . Gallstones   . Glaucoma   . Cataracts, bilateral   . COPD (chronic obstructive pulmonary disease) ([place])   . Hypothyroid   . PONV (postoperative nausea and vomiting)   . Asthma   . Chronic kidney disease 2007    kidney cancer and partial nephrctomy-right   Past Surgical History  Procedure Laterality Date  . Dilation and curettage of uterus    . Partial nephrectomy      cancer  . Breast biopsy    . D&c for  enlarged uterine  210  . Total abdominal hysterectomy w/ bilateral salpingoophorectomy  2011  . Cateract extractions and iol    . Cholecystectomy  2008  . Abdominal hysterectomy    . Eye surgery  08/2011,09/2012    bilateral cataracts  . Hernia repair  01/19/12    inguinal  . Inguinal hernia repair  01/19/2012    Procedure: HERNIA REPAIR INGUINAL ADULT;  Surgeon: Earnstine Regal, MD;  Location: [facility];  Service: General;  Laterality: Left;  repair left inguinal hernia with mesh    Family History  Problem Relation Age of Onset  . Cancer     Social History  Substance Use Topics  . Smoking status: Former Smoker -- 1.00 packs/day for 5 years    Types: Cigarettes    Quit date: 01/11/1982  . Smokeless tobacco: Never Used     Comment: quit maybe 44yrs  . Alcohol Use: No   OB History    No data available     Review of Systems  Constitutional: Negative for fever.  HENT: Negative for congestion, dental problem, rhinorrhea and sinus pressure.   Eyes: Negative for photophobia, discharge, redness and visual disturbance.  Respiratory: Negative for shortness of breath.   Cardiovascular: Negative for chest pain.  Gastrointestinal: Positive for vomiting. Negative for nausea.  Musculoskeletal: Negative for gait problem, neck pain and neck stiffness.  Skin: Negative for rash.  Neurological: Positive for speech difficulty, weakness (generalized) and headaches. Negative for syncope, light-headedness and numbness.  Psychiatric/Behavioral: Positive for confusion.    Allergies  Darvocet;  Metformin and related; Percocet; and Vicodin  Home Medications   Prior to Admission medications   Medication Sig Start Date End Date Taking? Authorizing Provider  Fluticasone-Salmeterol (ADVAIR) 250-50 MCG/DOSE AEPB Inhale 1 puff into the lungs 2 (two) times daily as needed (for wheezing/shortness of breath).     Historical Provider, MD  glimepiride (AMARYL) 2 MG tablet Take 4 mg by mouth at bedtime.      Historical Provider, MD  latanoprost (XALATAN) 0.005 % ophthalmic solution Place 1 drop into both eyes at bedtime.     Historical Provider, MD  lisinopril (PRINIVIL,ZESTRIL) 20 MG tablet Take 20 mg by mouth at bedtime.     Historical Provider, MD  pirbuterol (MAXAIR) 200 MCG/INH inhaler Inhale 2 puffs into the lungs 4 (four) times daily as needed for wheezing or shortness of breath.     Historical Provider, MD  polyethylene glycol (MIRALAX / GLYCOLAX) packet Take 17 g by mouth daily as needed for mild constipation.     Historical Provider, MD  thyroid (ARMOUR) 60 MG tablet Take 60 mg by mouth at bedtime.     Historical Provider, MD  verapamil (CALAN-SR) 180 MG CR tablet Take 180 mg by mouth at bedtime.    Historical Provider, MD   BP 134/71 mmHg  Pulse 64  Temp(Src) 97.9 F (36.6 C) (Oral)  Resp 14  SpO2 97%  LMP 03/24/2010   Physical Exam  Constitutional: She is oriented to person, place, and time. She appears well-developed and well-nourished.  HENT:  Head: Normocephalic and atraumatic.  Right Ear: Tympanic membrane, external ear and ear canal normal.  Left Ear: Tympanic membrane, external ear and ear canal normal.  Nose: Nose normal.  Mouth/Throat: Uvula is midline, oropharynx is clear and moist and mucous membranes are normal.  Eyes: Conjunctivae, EOM and lids are normal. Pupils are equal, round, and reactive to light. Right eye exhibits no nystagmus. Left eye exhibits no nystagmus.  Neck: Normal range of motion. Neck supple.  Cardiovascular: Normal rate and regular rhythm.   Pulmonary/Chest: Effort normal and breath sounds normal.  Abdominal: Soft. There is no tenderness.  Musculoskeletal:       Cervical back: She exhibits normal range of motion, no tenderness and no bony tenderness.  Neurological: She is alert and oriented to person, place, and time. She has normal strength and normal reflexes. No cranial nerve deficit or sensory deficit. She displays a negative Romberg sign.  Coordination normal. GCS eye subscore is 4. GCS verbal subscore is 5. GCS motor subscore is 6.  Patient does go off on some tangents but speech is fluent without aphasia. She is oriented at current time.   Skin: Skin is warm and dry.  Psychiatric: She has a normal mood and affect.  Nursing note and vitals reviewed.   ED Course  Procedures (including critical care time) Labs Review Labs Reviewed  COMPREHENSIVE METABOLIC PANEL - Abnormal; Notable for the following:    Chloride 98 (*)    Glucose, Bld 159 (*)    Calcium 10.5 (*)    ALT 11 (*)    Total Bilirubin 1.7 (*)    All other components within normal limits  PROTIME-INR  APTT  CBC  DIFFERENTIAL  ETHANOL  URINE RAPID DRUG SCREEN, HOSP PERFORMED  URINALYSIS, ROUTINE W REFLEX MICROSCOPIC (NOT AT [facility])  Randolm Idol, ED    Imaging Review Ct Head Wo Contrast  02/05/2016  CLINICAL DATA:  Previous subdural hematoma.  Confusion. EXAM: CT HEAD WITHOUT CONTRAST TECHNIQUE: Contiguous axial  images were obtained from the base of the skull through the vertex without intravenous contrast. COMPARISON:  January 21, 2016 FINDINGS: The previously noted subdural hematoma on the left shows evolution of the fluid with a more subacute appearance at this time. The maximum thickness of this subdural hematoma is increased with a maximum thickness of 12 mm in the posterior frontal region. There is 3 mm of midline shift toward the right currently. There has been interval effacement of the frontal horn of the left lateral ventricle compared to prior study. The previously noted right-sided subdural hematoma shows evolution as well. The maximum thickness of the right sided subdural hematoma is just over 4 mm at the frontal-temporal junction. No new extradural fluid collections are identified. There is no appreciable ventricular dilatation. Fourth ventricle is midline. The gray-white compartments appear normal. No acute infarct evident. Bony calvarium appears  intact. The mastoid air cells are clear. No intraorbital lesions are apparent in the visualized orbital regions. There is extensive frontal sinusitis as well as opacification of multiple ethmoid air cells a retention cyst in left sphenoid sinus region posteriorly. IMPRESSION: Evolution of bilateral subdural hematomas. The subdural hematoma on the right has largely resolved. However, the subdural hematoma on the left is larger, now with 3 mm of midline shift to the left. The left lateral ventricle is relatively effaced compared to most recent prior study. No acute infarct evident.  No intra-axial hemorrhage is seen. There is extensive paranasal sinus disease at multiple sites. Critical Value/emergent results were called by telephone at the time of interpretation on 02/05/2016 at 7:55 pm to [facility] , who verbally acknowledged these results. Electronically Signed   By: Lowella Grip III M.D.   On: 02/05/2016 19:56   I have personally reviewed and evaluated these images and lab results as part of my medical decision-making.   EKG Interpretation   Date/Time:  Wednesday February 05 2016 18:39:53 EDT Ventricular Rate:  70 PR Interval:    QRS Duration: 148 QT Interval:  448 QTC Calculation: 483 R Axis:   -58 Text Interpretation: Poor data quality, interpretation may be  adversely affected Confirmed by KNAPP  MD-J, JON [id number]) on 02/05/2016  6:44:30 PM       6:34 PM Patient seen and examined. No code stroke given improvement in symptoms, no focal neuro deficits at time of exam. Patient seen by Dr. Tomi Bamberger as well on arrival.   Vital signs reviewed and are as follows: BP 139/72 mmHg  Pulse 70  Temp(Src) 97.9 F (36.6 C) (Oral)  Resp 19  SpO2 97%  LMP 03/24/2010   8:03 PM Spoke with radiologist. I went to update patient and daughter and neurosurgery at bedside. Pt is stable.   8:23 PM Dr. Cyndy Freeze to admit. Plan for craniotomy tomorrow. He requests Keppra load, NPO after midnight.   MDM    Final diagnoses:  Subdural hematoma ([place])   Admit.     Carlisle Cater, PA-C 02/05/16 [address] PA-C 02/05/16 2024  Dorie Rank, MD 02/05/16 2026

## 2016-02-05 NOTE — Progress Notes (Signed)
[id number] called and received report from Rod Holler, [place] in ED

## 2016-02-05 NOTE — H&P (Signed)
CC:  Chief Complaint  Patient presents with  . Headache  . Emesis    HPI: Carolyn [name] is a 82 y.o. female who sustained small bilateral, left greater than right, subdurals at the beginning of March after a fall. She had not had any focal neurological symptoms or signs. She started taking Ultram for headaches yesterday. Today she had an event where she was dysphasic.  The family brought her to the emergency room and her symptoms had resolved by the time she got here. She is not having headaches at this time.  PMH: Past Medical History  Diagnosis Date  . Diabetes mellitus   . Hypertension   . Hypercholesterolemia   . Gallstones   . Glaucoma   . Cataracts, bilateral   . COPD (chronic obstructive pulmonary disease) ([place])   . Hypothyroid   . PONV (postoperative nausea and vomiting)   . Asthma   . Chronic kidney disease 2007    kidney cancer and partial nephrctomy-right  . Subdural bleeding ([facility])     PSH: Past Surgical History  Procedure Laterality Date  . Dilation and curettage of uterus    . Partial nephrectomy      cancer  . Breast biopsy    . D&c for enlarged uterine  210  . Total abdominal hysterectomy w/ bilateral salpingoophorectomy  2011  . Cateract extractions and iol    . Cholecystectomy  2008  . Abdominal hysterectomy    . Eye surgery  08/2011,09/2012    bilateral cataracts  . Hernia repair  01/19/12    inguinal  . Inguinal hernia repair  01/19/2012    Procedure: HERNIA REPAIR INGUINAL ADULT;  Surgeon: Earnstine Regal, MD;  Location: [facility];  Service: General;  Laterality: Left;  repair left inguinal hernia with mesh     SH: Social History  Substance Use Topics  . Smoking status: Former Smoker -- 1.00 packs/day for 5 years    Types: Cigarettes    Quit date: 01/11/1982  . Smokeless tobacco: Never Used     Comment: quit maybe 22yrs  . Alcohol Use: No    MEDS: Prior to Admission medications   Medication Sig Start Date End Date Taking? Authorizing Provider   Fluticasone-Salmeterol (ADVAIR) 250-50 MCG/DOSE AEPB Inhale 1 puff into the lungs 2 (two) times daily as needed (for wheezing/shortness of breath).    Yes Historical Provider, MD  glimepiride (AMARYL) 2 MG tablet Take 4 mg by mouth at bedtime.    Yes Historical Provider, MD  latanoprost (XALATAN) 0.005 % ophthalmic solution Place 1 drop into both eyes at bedtime.    Yes Historical Provider, MD  lisinopril (PRINIVIL,ZESTRIL) 20 MG tablet Take 20 mg by mouth at bedtime.    Yes Historical Provider, MD  pirbuterol (MAXAIR) 200 MCG/INH inhaler Inhale 2 puffs into the lungs 4 (four) times daily as needed for wheezing or shortness of breath.    Yes Historical Provider, MD  polyethylene glycol (MIRALAX / GLYCOLAX) packet Take 17 g by mouth daily as needed for mild constipation.    Yes Historical Provider, MD  theophylline (UNIPHYL) 400 MG 24 hr tablet Take 400 mg by mouth at bedtime.    Yes Historical Provider, MD  thyroid (ARMOUR) 60 MG tablet Take 60 mg by mouth at bedtime.    Yes Historical Provider, MD  traMADol (ULTRAM) 50 MG tablet Take 50 mg by mouth daily as needed for moderate pain.  02/04/16  Yes Historical Provider, MD  verapamil (CALAN-SR) 180 MG CR  tablet Take 180 mg by mouth at bedtime.   Yes Historical Provider, MD    ALLERGY: Allergies  Allergen Reactions  . Darvocet [Propoxyphene N-Acetaminophen] Nausea And Vomiting  . Metformin And Related Diarrhea  . Percocet [Oxycodone-Acetaminophen] Nausea And Vomiting  . Vicodin [Hydrocodone-Acetaminophen] Itching    ROS: ROS No headaches, no confusion, no neck pain, no motor weakness, no numbness, no vision changes  NEUROLOGIC EXAM: Awake, alert, oriented Memory and concentration grossly intact Speech fluent, appropriate CN grossly intact Follows commands throughout, no drift on the right, left-sided strength diminished due to chronic pain or injuries Sensation grossly intact to LT  IMAGING: Enlargement of left-sided subdural. It is  now more frontal and parietal membranes temporal. It is also higher on the convexity. It appears that there are membranes tethering the cortical surface to the dura at certain points.  IMPRESSION: - 82 y.o. female with enlarging left subdural hematoma and what appears to have been a simple partial seizure. I think this is likely related to her taking Ultram as well as the enlarging hematoma.  PLAN: - Admitted to ICU, start [place], stop Ultram, nothing by mouth after midnight, to OR tomorrow for craniotomy.

## 2016-02-06 ENCOUNTER — Inpatient Hospital Stay (HOSPITAL_COMMUNITY): Payer: PPO | Admitting: Certified Registered Nurse Anesthetist

## 2016-02-06 ENCOUNTER — Encounter (HOSPITAL_COMMUNITY)
Admission: EM | Disposition: A | Discharge status: Rehabilitation Facility | Payer: Self-pay | Source: Home / Self Care | Attending: Neurological Surgery

## 2016-02-06 DIAGNOSIS — E1122 Type 2 diabetes mellitus with diabetic chronic kidney disease: Secondary | ICD-10-CM | POA: Diagnosis not present

## 2016-02-06 DIAGNOSIS — G4089 Other seizures: Secondary | ICD-10-CM | POA: Diagnosis not present

## 2016-02-06 DIAGNOSIS — E1142 Type 2 diabetes mellitus with diabetic polyneuropathy: Secondary | ICD-10-CM | POA: Diagnosis not present

## 2016-02-06 DIAGNOSIS — J449 Chronic obstructive pulmonary disease, unspecified: Secondary | ICD-10-CM | POA: Diagnosis not present

## 2016-02-06 DIAGNOSIS — S065X0A Traumatic subdural hemorrhage without loss of consciousness, initial encounter: Secondary | ICD-10-CM | POA: Diagnosis not present

## 2016-02-06 DIAGNOSIS — I6203 Nontraumatic chronic subdural hemorrhage: Secondary | ICD-10-CM | POA: Diagnosis not present

## 2016-02-06 DIAGNOSIS — S065X9A Traumatic subdural hemorrhage with loss of consciousness of unspecified duration, initial encounter: Secondary | ICD-10-CM | POA: Diagnosis not present

## 2016-02-06 HISTORY — PX: CRANIOTOMY: SHX93

## 2016-02-06 LAB — TYPE AND SCREEN
ABO/RH(D): O POS
Antibody Screen: NEGATIVE

## 2016-02-06 LAB — GLUCOSE, CAPILLARY: Glucose-Capillary: 89 mg/dL (ref 65–99)

## 2016-02-06 SURGERY — CRANIOTOMY HEMATOMA EVACUATION SUBDURAL
Anesthesia: General | Site: Head | Laterality: Left

## 2016-02-06 MED ORDER — FENTANYL CITRATE (PF) 250 MCG/5ML IJ SOLN
INTRAMUSCULAR | Status: AC
  Filled 2016-02-06: qty 5

## 2016-02-06 MED ORDER — MIDAZOLAM HCL 2 MG/2ML IJ SOLN: 0.5000 mg | Freq: Once | INTRAMUSCULAR | Status: DC | PRN

## 2016-02-06 MED ORDER — FENTANYL CITRATE (PF) 100 MCG/2ML IJ SOLN
INTRAMUSCULAR | Status: DC | PRN
  Administered 2016-02-06: 100 ug via INTRAVENOUS

## 2016-02-06 MED ORDER — CEFAZOLIN SODIUM-DEXTROSE 2-4 GM/100ML-% IV SOLN
2.0000 g | INTRAVENOUS | Status: AC
  Administered 2016-02-06: 2 g via INTRAVENOUS
  Filled 2016-02-06: qty 100

## 2016-02-06 MED ORDER — SUGAMMADEX SODIUM 200 MG/2ML IV SOLN
INTRAVENOUS | Status: DC | PRN
  Administered 2016-02-06: 200 mg via INTRAVENOUS

## 2016-02-06 MED ORDER — PROMETHAZINE HCL 25 MG/ML IJ SOLN: 6.2500 mg | INTRAMUSCULAR | Status: DC | PRN

## 2016-02-06 MED ORDER — BUPIVACAINE-EPINEPHRINE (PF) 0.5% -1:200000 IJ SOLN
INTRAMUSCULAR | Status: DC | PRN
  Administered 2016-02-06: 15 mL

## 2016-02-06 MED ORDER — PHENYLEPHRINE HCL 10 MG/ML IJ SOLN
10.0000 mg | INTRAVENOUS | Status: DC | PRN
  Administered 2016-02-06: 20 ug/min via INTRAVENOUS

## 2016-02-06 MED ORDER — SENNA 8.6 MG PO TABS
1.0000 | ORAL_TABLET | Freq: Two times a day (BID) | ORAL | Status: DC
  Administered 2016-02-06 – 2016-02-12 (×9): 8.6 mg via ORAL
  Filled 2016-02-06 (×11): qty 1

## 2016-02-06 MED ORDER — FLEET ENEMA 7-19 GM/118ML RE ENEM: 1.0000 | ENEMA | Freq: Once | RECTAL | Status: DC | PRN

## 2016-02-06 MED ORDER — NALOXONE HCL 0.4 MG/ML IJ SOLN: 0.0800 mg | INTRAMUSCULAR | Status: DC | PRN

## 2016-02-06 MED ORDER — SODIUM CHLORIDE 0.9 % IV SOLN
INTRAVENOUS | Status: DC | PRN
  Administered 2016-02-06: 15:00:00 via INTRAVENOUS

## 2016-02-06 MED ORDER — ONDANSETRON HCL 4 MG/2ML IJ SOLN
INTRAMUSCULAR | Status: AC
  Filled 2016-02-06: qty 2

## 2016-02-06 MED ORDER — MEPERIDINE HCL 25 MG/ML IJ SOLN: 6.2500 mg | INTRAMUSCULAR | Status: DC | PRN

## 2016-02-06 MED ORDER — PROPOFOL 10 MG/ML IV BOLUS
INTRAVENOUS | Status: DC | PRN
  Administered 2016-02-06: 30 mg via INTRAVENOUS
  Administered 2016-02-06: 120 mg via INTRAVENOUS

## 2016-02-06 MED ORDER — BISACODYL 5 MG PO TBEC: 5.0000 mg | DELAYED_RELEASE_TABLET | Freq: Every day | ORAL | Status: DC | PRN

## 2016-02-06 MED ORDER — FENTANYL CITRATE (PF) 100 MCG/2ML IJ SOLN: 25.0000 ug | INTRAMUSCULAR | Status: DC | PRN

## 2016-02-06 MED ORDER — THROMBIN 5000 UNITS EX SOLR
OROMUCOSAL | Status: DC | PRN
  Administered 2016-02-06: 17:00:00 via TOPICAL

## 2016-02-06 MED ORDER — ROCURONIUM BROMIDE 100 MG/10ML IV SOLN
INTRAVENOUS | Status: DC | PRN
Start: 2016-02-06 — End: 2016-02-06
  Administered 2016-02-06: 30 mg via INTRAVENOUS
  Administered 2016-02-06: 10 mg via INTRAVENOUS

## 2016-02-06 MED ORDER — PANTOPRAZOLE SODIUM 40 MG IV SOLR
40.0000 mg | Freq: Every day | INTRAVENOUS | Status: DC
  Administered 2016-02-06 – 2016-02-07 (×2): 40 mg via INTRAVENOUS
  Filled 2016-02-06 (×2): qty 40

## 2016-02-06 MED ORDER — PROPOFOL 10 MG/ML IV BOLUS
INTRAVENOUS | Status: AC
  Filled 2016-02-06: qty 20

## 2016-02-06 MED ORDER — BACITRACIN ZINC 500 UNIT/GM EX OINT
TOPICAL_OINTMENT | CUTANEOUS | Status: DC | PRN
  Administered 2016-02-06 (×2): 1 via TOPICAL

## 2016-02-06 MED ORDER — HYDROMORPHONE HCL 1 MG/ML IJ SOLN
0.5000 mg | INTRAMUSCULAR | Status: DC | PRN
  Administered 2016-02-06 – 2016-02-07 (×4): 1 mg via INTRAVENOUS
  Filled 2016-02-06 (×4): qty 1

## 2016-02-06 MED ORDER — PHENYLEPHRINE 40 MCG/ML (10ML) SYRINGE FOR IV PUSH (FOR BLOOD PRESSURE SUPPORT)
PREFILLED_SYRINGE | INTRAVENOUS | Status: AC
  Filled 2016-02-06: qty 20

## 2016-02-06 MED ORDER — SODIUM CHLORIDE 0.9 % IV SOLN
INTRAVENOUS | Status: DC
  Administered 2016-02-06: 100 mL/h via INTRAVENOUS
  Administered 2016-02-09: 22:00:00 via INTRAVENOUS

## 2016-02-06 MED ORDER — 0.9 % SODIUM CHLORIDE (POUR BTL) OPTIME
TOPICAL | Status: DC | PRN
  Administered 2016-02-06 (×3): 1000 mL

## 2016-02-06 MED ORDER — LIDOCAINE HCL (CARDIAC) 20 MG/ML IV SOLN
INTRAVENOUS | Status: DC | PRN
  Administered 2016-02-06: 30 mg via INTRAVENOUS

## 2016-02-06 MED ORDER — DEXAMETHASONE SODIUM PHOSPHATE 10 MG/ML IJ SOLN
INTRAMUSCULAR | Status: DC | PRN
  Administered 2016-02-06: 10 mg via INTRAVENOUS

## 2016-02-06 MED ORDER — PHENYLEPHRINE HCL 10 MG/ML IJ SOLN
INTRAMUSCULAR | Status: DC | PRN
  Administered 2016-02-06 (×2): 120 ug via INTRAVENOUS

## 2016-02-06 MED ORDER — ONDANSETRON HCL 4 MG/2ML IJ SOLN
INTRAMUSCULAR | Status: DC | PRN
Start: 2016-02-06 — End: 2016-02-06
  Administered 2016-02-06: 4 mg via INTRAVENOUS

## 2016-02-06 MED ORDER — SODIUM CHLORIDE 0.9 % IV SOLN
INTRAVENOUS | Status: DC | PRN
Start: 2016-02-06 — End: 2016-02-06
  Administered 2016-02-06: 16:00:00 via INTRAVENOUS

## 2016-02-06 MED ORDER — DOCUSATE SODIUM 100 MG PO CAPS
100.0000 mg | ORAL_CAPSULE | Freq: Two times a day (BID) | ORAL | Status: DC
  Administered 2016-02-06 – 2016-02-12 (×8): 100 mg via ORAL
  Filled 2016-02-06 (×11): qty 1

## 2016-02-06 MED ORDER — ONDANSETRON HCL 4 MG/2ML IJ SOLN: 4.0000 mg | INTRAMUSCULAR | Status: DC | PRN

## 2016-02-06 MED ORDER — LIDOCAINE-EPINEPHRINE 2 %-1:100000 IJ SOLN
INTRAMUSCULAR | Status: DC | PRN
  Administered 2016-02-06: 15 mL

## 2016-02-06 MED ORDER — SUGAMMADEX SODIUM 200 MG/2ML IV SOLN
INTRAVENOUS | Status: AC
  Filled 2016-02-06: qty 2

## 2016-02-06 MED ORDER — HEMOSTATIC AGENTS (NO CHARGE) OPTIME
TOPICAL | Status: DC | PRN
  Administered 2016-02-06: 1 via TOPICAL

## 2016-02-06 MED ORDER — MICROFIBRILLAR COLL HEMOSTAT EX POWD
CUTANEOUS | Status: DC | PRN
  Administered 2016-02-06: .5 g via TOPICAL

## 2016-02-06 MED ORDER — THROMBIN 20000 UNITS EX SOLR
CUTANEOUS | Status: DC | PRN
  Administered 2016-02-06: 17:00:00 via TOPICAL

## 2016-02-06 MED ORDER — ONDANSETRON HCL 4 MG PO TABS: 4.0000 mg | ORAL_TABLET | ORAL | Status: DC | PRN

## 2016-02-06 MED ORDER — CEFAZOLIN SODIUM-DEXTROSE 2-4 GM/100ML-% IV SOLN
2.0000 g | Freq: Three times a day (TID) | INTRAVENOUS | Status: AC
  Administered 2016-02-06 – 2016-02-07 (×2): 2 g via INTRAVENOUS
  Filled 2016-02-06 (×2): qty 100

## 2016-02-06 MED ORDER — GLYCOPYRROLATE 0.2 MG/ML IJ SOLN
INTRAMUSCULAR | Status: AC
  Filled 2016-02-06: qty 1

## 2016-02-06 MED ORDER — HYDRALAZINE HCL 20 MG/ML IJ SOLN: 5.0000 mg | INTRAMUSCULAR | Status: DC | PRN

## 2016-02-06 MED ORDER — SODIUM CHLORIDE 0.9 % IR SOLN
Status: DC | PRN
  Administered 2016-02-06: 17:00:00

## 2016-02-06 SURGICAL SUPPLY — 100 items
APL SKNCLS STERI-STRIP NONHPOA (GAUZE/BANDAGES/DRESSINGS)
APPLICATOR CHLORAPREP 3ML ORNG (MISCELLANEOUS) ×1 IMPLANT
BATTERY IQ STERILE (MISCELLANEOUS) ×2 IMPLANT
BENZOIN TINCTURE PRP APPL 2/3 (GAUZE/BANDAGES/DRESSINGS) IMPLANT
BLADE CLIPPER SPEC (BLADE) ×3 IMPLANT
BLADE CLIPPER SURG (BLADE) ×1 IMPLANT
BLADE ULTRA TIP 2M (BLADE) ×3 IMPLANT
BNDG GAUZE ELAST 4 BULKY (GAUZE/BANDAGES/DRESSINGS) IMPLANT
BRUSH SCRUB EZ 1% IODOPHOR (MISCELLANEOUS) IMPLANT
BRUSH SCRUB EZ PLAIN DRY (MISCELLANEOUS) ×2 IMPLANT
BTRY SRG DRVR 1.5 IQ (MISCELLANEOUS) ×1
BUR ACORN 6.0 PRECISION (BURR) ×2 IMPLANT
BUR ACORN 6.0MM PRECISION (BURR) ×1
BUR ADDG 1.1 (BURR) ×2 IMPLANT
BUR ADDG 1.1MM (BURR) ×1
BUR MATCHSTICK NEURO 3.0 LAGG (BURR) IMPLANT
BUR SPIRAL ROUTER 2.3 (BUR) ×1 IMPLANT
BUR SPIRAL ROUTER 2.3MM (BUR) ×1
CANISTER SUCT 3000ML PPV (MISCELLANEOUS) ×5 IMPLANT
CATH ROBINSON RED A/P 14FR (CATHETERS) IMPLANT
CHLORAPREP W/TINT 26ML (MISCELLANEOUS) ×2 IMPLANT
CLIP TI MEDIUM 6 (CLIP) IMPLANT
DRAIN SNY WOU 7FLT (WOUND CARE) IMPLANT
DRAPE NEUROLOGICAL W/INCISE (DRAPES) ×3 IMPLANT
DRAPE SHEET LG 3/4 BI-LAMINATE (DRAPES) ×4 IMPLANT
DRAPE SURG 17X23 STRL (DRAPES) IMPLANT
DRAPE WARM FLUID 44X44 (DRAPE) ×3 IMPLANT
DRSG TELFA 3X8 NADH (GAUZE/BANDAGES/DRESSINGS) ×3 IMPLANT
ELECT REM PT RETURN 9FT ADLT (ELECTROSURGICAL) ×3
ELECTRODE REM PT RTRN 9FT ADLT (ELECTROSURGICAL) ×1 IMPLANT
EVACUATOR 1/8 PVC DRAIN (DRAIN) IMPLANT
EVACUATOR SILICONE 100CC (DRAIN) IMPLANT
GAUZE SPONGE 4X4 12PLY STRL (GAUZE/BANDAGES/DRESSINGS) ×1 IMPLANT
GAUZE SPONGE 4X4 16PLY XRAY LF (GAUZE/BANDAGES/DRESSINGS) IMPLANT
GLOVE BIO SURGEON STRL SZ7 (GLOVE) ×2 IMPLANT
GLOVE BIO SURGEON STRL SZ8 (GLOVE) ×2 IMPLANT
GLOVE BIO SURGEON STRL SZ8.5 (GLOVE) ×2 IMPLANT
GLOVE BIOGEL PI IND STRL 7.0 (GLOVE) IMPLANT
GLOVE BIOGEL PI IND STRL 7.5 (GLOVE) ×1 IMPLANT
GLOVE BIOGEL PI INDICATOR 7.0 (GLOVE) ×6
GLOVE BIOGEL PI INDICATOR 7.5 (GLOVE) ×6
GLOVE EXAM NITRILE LRG STRL (GLOVE) ×3 IMPLANT
GLOVE EXAM NITRILE MD LF STRL (GLOVE) IMPLANT
GLOVE EXAM NITRILE XL STR (GLOVE) IMPLANT
GLOVE EXAM NITRILE XS STR PU (GLOVE) IMPLANT
GLOVE SS BIOGEL STRL SZ 7 (GLOVE) ×2 IMPLANT
GLOVE SUPERSENSE BIOGEL SZ 7 (GLOVE) ×4
GOWN STRL REUS W/ TWL LRG LVL3 (GOWN DISPOSABLE) ×2 IMPLANT
GOWN STRL REUS W/ TWL XL LVL3 (GOWN DISPOSABLE) ×1 IMPLANT
GOWN STRL REUS W/TWL 2XL LVL3 (GOWN DISPOSABLE) IMPLANT
GOWN STRL REUS W/TWL LRG LVL3 (GOWN DISPOSABLE) ×6
GOWN STRL REUS W/TWL XL LVL3 (GOWN DISPOSABLE) ×3
HEMOSTAT POWDER KIT SURGIFOAM (HEMOSTASIS) ×3 IMPLANT
HEMOSTAT SURGICEL 2X14 (HEMOSTASIS) ×2 IMPLANT
KIT BASIN OR (CUSTOM PROCEDURE TRAY) ×3 IMPLANT
KIT ROOM TURNOVER OR (KITS) ×3 IMPLANT
MARKER PEN SURG W/LABELS BLK (STERILIZATION PRODUCTS) ×2 IMPLANT
NDL HYPO 21X1.5 SAFETY (NEEDLE) ×1 IMPLANT
NDL HYPO 25X1 1.5 SAFETY (NEEDLE) ×1 IMPLANT
NEEDLE HYPO 21X1.5 SAFETY (NEEDLE) ×3 IMPLANT
NEEDLE HYPO 25X1 1.5 SAFETY (NEEDLE) ×3 IMPLANT
NS IRRIG 1000ML POUR BTL (IV SOLUTION) ×8 IMPLANT
PACK CRANIOTOMY (CUSTOM PROCEDURE TRAY) ×3 IMPLANT
PAD DRESSING TELFA 3X8 NADH (GAUZE/BANDAGES/DRESSINGS) IMPLANT
PATTIES SURGICAL .5 X.5 (GAUZE/BANDAGES/DRESSINGS) IMPLANT
PATTIES SURGICAL .5 X3 (DISPOSABLE) IMPLANT
PATTIES SURGICAL .5X1.5 (GAUZE/BANDAGES/DRESSINGS) IMPLANT
PATTIES SURGICAL 1X1 (DISPOSABLE) IMPLANT
PERFORATOR LRG  14-11MM (BIT) ×2
PERFORATOR LRG 14-11MM (BIT) IMPLANT
PIN MAYFIELD SKULL DISP (PIN) ×3 IMPLANT
PLATE 1.5  2HOLE LNG NEURO (Plate) ×6 IMPLANT
PLATE 1.5 2HOLE LNG NEURO (Plate) IMPLANT
PLATE 1.5/0.5 18.5MM BURR HOLE (Plate) ×2 IMPLANT
SCREW SELF DRILL HT 1.5/4MM (Screw) ×18 IMPLANT
SPONGE NEURO XRAY DETECT 1X3 (DISPOSABLE) IMPLANT
SPONGE SURGIFOAM ABS GEL 100 (HEMOSTASIS) ×3 IMPLANT
SPONGE SURGIFOAM ABS GEL 100C (HEMOSTASIS) ×1 IMPLANT
STAPLER VISISTAT 35W (STAPLE) ×3 IMPLANT
STOCKINETTE 6  STRL (DRAPES)
STOCKINETTE 6 STRL (DRAPES) ×1 IMPLANT
STRIP SURGICAL 1 X 6 IN (GAUZE/BANDAGES/DRESSINGS) IMPLANT
STRIP SURGICAL 1/2 X 6 IN (GAUZE/BANDAGES/DRESSINGS) IMPLANT
STRIP SURGICAL 1/4 X 6 IN (GAUZE/BANDAGES/DRESSINGS) IMPLANT
STRIP SURGICAL 3/4 X 6 IN (GAUZE/BANDAGES/DRESSINGS) IMPLANT
SUT ETHILON 3 0 FSL (SUTURE) ×2 IMPLANT
SUT ETHILON 3 0 PS 1 (SUTURE) IMPLANT
SUT NURALON 4 0 TR CR/8 (SUTURE) ×6 IMPLANT
SUT VIC AB 0 CT1 18XCR BRD8 (SUTURE) ×2 IMPLANT
SUT VIC AB 0 CT1 8-18 (SUTURE) ×3
SUT VIC AB 2-0 CT1 18 (SUTURE) ×6 IMPLANT
SUT VIC AB 3-0 SH 8-18 (SUTURE) ×3 IMPLANT
SYR 30ML LL (SYRINGE) ×6 IMPLANT
TOWEL OR 17X24 6PK STRL BLUE (TOWEL DISPOSABLE) ×3 IMPLANT
TOWEL OR 17X26 10 PK STRL BLUE (TOWEL DISPOSABLE) ×3 IMPLANT
TRAY FOLEY W/METER SILVER 14FR (SET/KITS/TRAYS/PACK) ×3 IMPLANT
TUBE CONNECTING 12'X1/4 (SUCTIONS)
TUBE CONNECTING 12X1/4 (SUCTIONS) ×1 IMPLANT
UNDERPAD 30X30 INCONTINENT (UNDERPADS AND DIAPERS) IMPLANT
WATER STERILE IRR 1000ML POUR (IV SOLUTION) ×3 IMPLANT

## 2016-02-06 NOTE — Progress Notes (Signed)
OT Cancellation Note  Patient Details Name: Carolyn [name] MRN: [id number] DOB: 12-Oct-1933   Cancelled Treatment:    Reason Eval/Treat Not Completed: Other (comment) Currently has active bedrest orders. Please update activity orders in order to initiate therapy. Thanks [place], OTR/L  [phone] 02/06/2016 02/06/2016, 6:14 AM

## 2016-02-06 NOTE — Progress Notes (Signed)
No acute events AVSS Awake and alert Moving all extremities well Labs look good for OR this afternoon Stable To OR this afternoon for left craniotomy for subdural hematoma

## 2016-02-06 NOTE — Transfer of Care (Signed)
Immediate Anesthesia Transfer of Care Note  Patient: Carolyn [name]  Procedure(s) Performed: Procedure(s): CRANIOTOMY HEMATOMA EVACUATION SUBDURAL (Left)  Patient Location: PACU  Anesthesia Type:General  Level of Consciousness: awake and alert   Airway & Oxygen Therapy: Patient Spontanous Breathing and Patient connected to nasal cannula oxygen  Post-op Assessment: Report given to RN, Post -op Vital signs reviewed and stable and Patient moving all extremities X 4  Post vital signs: Reviewed and stable  Last Vitals:  Filed Vitals:   02/06/16 1443 02/06/16 1710  BP:  121/57  Pulse: 53 72  Temp:  37.1 C  Resp: 17 20    Complications: No apparent anesthesia complications

## 2016-02-06 NOTE — Progress Notes (Signed)
Awake and alert Following commands bilaterally Doing well To ICU

## 2016-02-06 NOTE — Progress Notes (Signed)
SLP Cancellation Note  Patient Details Name: Carolyn [name] MRN: [id number] DOB: 04/18/1933   Cancelled treatment:       Reason Eval/Treat Not Completed: Other (comment). Patient going to OR today for craniotomy. Plan to complete cognitive-linguistic evaluation after craniotomy as changes may occur.   [place], CCC-SLP [phone]    Gabriel Rainwater Meryl 02/06/2016, 7:34 AM

## 2016-02-06 NOTE — Op Note (Signed)
02/05/2016 - 02/06/2016  4:55 PM  PATIENT:  Carolyn [name]  82 y.o. female  PRE-OPERATIVE DIAGNOSIS:  Enlarging chronic subdural hematoma, left. Simple partial seizures.  POST-OPERATIVE DIAGNOSIS:  Same   PROCEDURE:  Left craniotomy for evacuation of subdural hematoma  SURGEON:  Aldean Ast, MD  ASSISTANTS: Newman Pies, M.D.  ANESTHESIA:   General  DRAINS: None  SPECIMEN:  None  INDICATION FOR PROCEDURE: 82 year old Caucasian woman who sustained a small acute subdural at the beginning of the month after a ground-level fall. She recently had a simple partial seizure which involved dysphasia and came to the ER and a CT scan revealed an enlarging chronic subdural hematoma. Patient understood the risks, benefits, and alternatives and potential outcomes and wished to proceed.  PROCEDURE DETAILS: After smooth induction of general endotracheal anesthesia the patient's head was fixated in [place] pins turned to the right. The scalp was clipped of hair over the region of the planned incision. The skin was wiped down with alcohol. Lidocaine and Marcaine with epinephrine was injected along the line of the planned incision. The patient was prepped and draped in usual sterile fashion.  A linear incision was made parallel to the superior temporal line a proximally 3 cm above it. Raney clips were placed the skin edges. The pericranium was scraped back with periosteal elevators. Self-retaining retractors were placed into the wound to hold the incision open. A single burr hole was made at the posterior edge of the incision. A craniotomy was turned which involved the entire exposed skull.  The dura was opened sharply. There was dark but clear fluid under tension. There were some adhesions which were sharply dissected. I thoroughly irrigated the subdural space. There was no further subdural fluid. Hemostasis was obtained with bipolar cautery and topical hemostatic agents. The dura was closed  loosely. Tack up holes were placed in the craniotomy flap. Tack up sutures were passed from the dura through the tack up holes on the craniotomy flap. The craniotomy flap was fixated to the skull with titanium plates. We tack up sutures were tied.  I irrigated vigorously again. The wound was closed in routine anatomic layers. The skin was closed with a running locked nylon suture. The patient was taken out of [place] pins and allowed to awaken from anesthesia.  PATIENT DISPOSITION:  ICU   Delay start of Pharmacological VTE agent (>24hrs) due to surgical blood loss or risk of bleeding: Yes

## 2016-02-06 NOTE — Anesthesia Procedure Notes (Signed)
Procedure Name: Intubation Date/Time: 02/06/2016 3:41 PM Performed by: Layla Maw Pre-anesthesia Checklist: Patient identified, Patient being monitored, Timeout performed, Emergency Drugs available and Suction available Patient Re-evaluated:Patient Re-evaluated prior to inductionOxygen Delivery Method: Circle System Utilized Preoxygenation: Pre-oxygenation with 100% oxygen Intubation Type: IV induction Ventilation: Mask ventilation without difficulty Laryngoscope Size: Miller and 3 Grade View: Grade II Tube type: Oral Tube size: 7.5 mm Number of attempts: 1 Airway Equipment and Method: Stylet Placement Confirmation: ETT inserted through vocal cords under direct vision,  positive ETCO2 and breath sounds checked- equal and bilateral Secured at: 21 cm Tube secured with: Tape Dental Injury: Teeth and Oropharynx as per pre-operative assessment

## 2016-02-06 NOTE — Anesthesia Preprocedure Evaluation (Addendum)
Anesthesia Evaluation  Patient identified by MRN, date of birth, ID band Patient awake    Reviewed: Allergy & Precautions, NPO status , Patient's Chart, lab work & pertinent test results  History of Anesthesia Complications (+) PONV and history of anesthetic complications  Airway Mallampati: II  TM Distance: >3 FB Neck ROM: Full    Dental  (+) Implants, Caps, Dental Advisory Given   Pulmonary COPD, former smoker (quit 1983),    breath sounds clear to auscultation       Cardiovascular hypertension, Pt. on medications (-) angina Rhythm:Regular Rate:Normal     Neuro/Psych [place] in March: bilateral SDH Now presents with seizure: L SDH has enlarged    GI/Hepatic negative GI ROS, Neg liver ROS,   Endo/Other  diabetes, Oral Hypoglycemic AgentsHypothyroidism   Renal/GU H/o renal cancer: s/p partial nephrectomy     Musculoskeletal   Abdominal (+) + obese,   Peds  Hematology negative hematology ROS (+)   Anesthesia Other Findings   Reproductive/Obstetrics                           Anesthesia Physical Anesthesia Plan  ASA: III  Anesthesia Plan: General   Post-op Pain Management:    Induction: Intravenous  Airway Management Planned: Oral ETT  Additional Equipment: Arterial line  Intra-op Plan:   Post-operative Plan: Possible Post-op intubation/ventilation  Informed Consent: I have reviewed the patients History and Physical, chart, labs and discussed the procedure including the risks, benefits and alternatives for the proposed anesthesia with the patient or authorized representative who has indicated his/her understanding and acceptance.   Dental advisory given and Consent reviewed with POA  Plan Discussed with: CRNA and Surgeon  Anesthesia Plan Comments: (Plan routine monitors, A line, GETA with possible post op ventilation)        Anesthesia Quick Evaluation

## 2016-02-06 NOTE — Progress Notes (Signed)
PT Cancellation Note  Patient Details Name: Carolyn [name] MRN: [id number] DOB: July 21, 1933   Cancelled Treatment:    Reason Eval/Treat Not Completed: Patient not medically ready.  Pt currently planned for OR today for Craniotomy.  Please update PT orders and activity orders once pt is appropriate for PT post-op.     Catarina Hartshorn, [place] 02/06/2016, 8:08 AM

## 2016-02-07 ENCOUNTER — Encounter (HOSPITAL_COMMUNITY): Payer: Self-pay | Admitting: Neurological Surgery

## 2016-02-07 LAB — BASIC METABOLIC PANEL
Anion gap: 7 (ref 5–15)
BUN: 10 mg/dL (ref 6–20)
CO2: 25 mmol/L (ref 22–32)
Calcium: 9.4 mg/dL (ref 8.9–10.3)
Chloride: 106 mmol/L (ref 101–111)
Creatinine, Ser: 0.65 mg/dL (ref 0.44–1.00)
GFR calc Af Amer: >60 mL/min (ref >60)
GFR calc non Af Amer: >60 mL/min (ref >60)
Glucose, Bld: 199 mg/dL — ABNORMAL HIGH (ref 65–99)
Potassium: 4 mmol/L (ref 3.5–5.1)
Sodium: 138 mmol/L (ref 135–145)

## 2016-02-07 LAB — CBC
HCT: 37.4 % (ref 36.0–46.0)
Hemoglobin: 12.7 g/dL (ref 12.0–15.0)
MCH: 29.3 pg (ref 26.0–34.0)
MCHC: 34 g/dL (ref 30.0–36.0)
MCV: 86.2 fL (ref 78.0–100.0)
Platelets: 179 10*3/uL (ref 150–400)
RBC: 4.34 MIL/uL (ref 3.87–5.11)
RDW: 13.4 % (ref 11.5–15.5)
WBC: 9.6 10*3/uL (ref 4.0–10.5)

## 2016-02-07 LAB — GLUCOSE, CAPILLARY: Glucose-Capillary: 188 mg/dL — ABNORMAL HIGH (ref 65–99)

## 2016-02-07 NOTE — Progress Notes (Signed)
 PHYSICAL MEDICINE AND REHABILITATION  CONSULT SERVICE NOTE  Chart reviewed. 82yo female s/p ground level fall with enlarging subacute SDH which required craniotomy and evacuation. POD #1 today. Family at home to assist? Will follow over weekend for progress as therapy has recommended SNF thus far. May have potential to participate in more intensive therapy on inpatient rehab by the beginning of next week.   Meredith Staggers, MD, [facility] 02/07/2016

## 2016-02-07 NOTE — Evaluation (Signed)
Physical Therapy Evaluation Patient Details Name: Carolyn [name] MRN: [id number] DOB: 1932/12/27 Today's Date: 02/07/2016   History of Present Illness  pt presents with fall in early March sustaining bil L>R SDHs with L SDH enlarging and causing partial seizures.  pt now s/p L Craniotomy and Evacuation of SDH.  During fall pt also sustained L Radial Head fx.  pt with hx of DM, HTN, Glaucoma, Bil Cataract Extractions, COPD, CKD, and per pt Arthritis in Bil knees and hips.    Clinical Impression  Pt needs max encouragement for participation and about importance of mobility.  Pt att his time requires 2 person A for all aspects of mobility and feel pt will need continued therapies at D/C to maximize independence and decrease burden of care prior to returning to home with family.  Will continue to follow.      Follow Up Recommendations SNF    Equipment Recommendations  None recommended by PT    Recommendations for Other Services       Precautions / Restrictions Precautions Precautions: Fall Precaution Comments: Noted Sling used during previous admission and pt indicates her sling is at home.  RN aware.   Restrictions Weight Bearing Restrictions: No      Mobility  Bed Mobility Overal bed mobility: Needs Assistance;+2 for physical assistance Bed Mobility: Supine to Sit     Supine to sit: Max assist;+2 for physical assistance;HOB elevated     General bed mobility comments: pt needs cues for sequencing and encouragement.  pt needs A for Bil LEs and bringing trunk up to sitting.    Transfers Overall transfer level: Needs assistance Equipment used: 2 person hand held assist Transfers: Sit to/from [facility] Sit to Stand: Total assist;Mod assist;+2 physical assistance Stand pivot transfers: Max assist;+2 physical assistance       General transfer comment: pt initially not assisting with coming to stand, but with repeated cueing and encouragement was able to come to  standing with ModA x2.  Utilized pad under hips to A with fully coming to stand and completing pivot to recliner.    Ambulation/Gait                Stairs            Wheelchair Mobility    Modified Rankin (Stroke Patients Only)       Balance Overall balance assessment: Needs assistance;History of Falls Sitting-balance support: Single extremity supported;Feet supported Sitting balance-Leahy Scale: Poor Sitting balance - Comments: pt holds L UE with elbow nearly fully flexed while sitting.     Standing balance support: No upper extremity supported;During functional activity Standing balance-Leahy Scale: Poor                               Pertinent Vitals/Pain Pain Assessment: Faces Faces Pain Scale: Hurts little more Pain Location: Headache Pain Descriptors / Indicators: Headache Pain Intervention(s): Monitored during session;Premedicated before session;Repositioned    Home Living Family/patient expects to be discharged to:: Skilled nursing facility                      Prior Function Level of Independence: Needs assistance   Gait / Transfers Assistance Needed: pt indicates since fall she only ambulates short distances in the house with a cane  ADL's / Homemaking Assistance Needed: A with ADLs sicne fall and daughter performs all homemaking tasks.        Hand  Dominance   Dominant Hand: Right    Extremity/Trunk Assessment   Upper Extremity Assessment: Defer to OT evaluation           Lower Extremity Assessment: Generalized weakness         Communication   Communication: No difficulties  Cognition Arousal/Alertness: Awake/alert Behavior During Therapy: WFL for tasks assessed/performed Overall Cognitive Status: Impaired/Different from baseline Area of Impairment: Attention;Memory;Following commands;Safety/judgement;Awareness;Problem solving;Orientation Orientation Level: Time Current Attention Level: Sustained Memory:  Decreased short-term memory Following Commands: Follows one step commands with increased time;Follows multi-step commands inconsistently Safety/Judgement: Decreased awareness of safety;Decreased awareness of deficits Awareness: Emergent Problem Solving: Slow processing;Decreased initiation;Difficulty sequencing;Requires verbal cues;Requires tactile cues      General Comments      Exercises        Assessment/Plan    PT Assessment Patient needs continued PT services  PT Diagnosis Difficulty walking;Generalized weakness;Altered mental status   PT Problem List Decreased strength;Decreased activity tolerance;Decreased balance;Decreased mobility;Decreased coordination;Decreased cognition;Decreased knowledge of use of DME;Decreased safety awareness;Obesity;Pain  PT Treatment Interventions DME instruction;Gait training;Functional mobility training;Therapeutic activities;Therapeutic exercise;Balance training;Neuromuscular re-education;Cognitive remediation;Patient/family education   PT Goals (Current goals can be found in the Care Plan section) Acute Rehab PT Goals Patient Stated Goal: to feel better PT Goal Formulation: With patient/family Time For Goal Achievement: 02/21/16 Potential to Achieve Goals: Good    Frequency Min 3X/week   Barriers to discharge        Co-evaluation               End of Session Equipment Utilized During Treatment: Gait belt Activity Tolerance: Patient limited by fatigue Patient left: in chair;with call bell/phone within reach;with chair alarm set;with family/visitor present (Son arrived at end of session.) Nurse Communication: Mobility status;Need for lift equipment         Time: [id number] PT Time Calculation (min) (ACUTE ONLY): 32 min   Charges:   PT Evaluation $PT Eval Moderate Complexity: 1 Procedure PT Treatments $Therapeutic Activity: 8-22 mins   PT G CodesCatarina Hartshorn, [place] 02/07/2016, 10:07 AM

## 2016-02-07 NOTE — Progress Notes (Signed)
No acute events AVSS Awake and alert Speech fluent and appropriate Moving all extremities well, no drift Bandage with dried blood Stable Transfer to floor Therapy Consult rehab

## 2016-02-07 NOTE — Evaluation (Signed)
Occupational Therapy Evaluation Patient Details Name: Carolyn [name] MRN: [id number] DOB: 1932-11-19 Today's Date: 02/07/2016    History of Present Illness pt presents with fall in early March sustaining bil L>R SDHs with L SDH enlarging and causing partial seizures.  pt now s/p L Craniotomy and Evacuation of SDH.  During fall pt also sustained L Radial Head fx.  pt with hx of DM, HTN, Glaucoma, Bil Cataract Extractions, COPD, CKD, and per pt Arthritis in Bil knees and hips.     Clinical Impression   Pt admitted with above. She demonstrates the below listed deficits and will benefit from continued OT to maximize safety and independence with BADLs. Pt is familiar to this OT from previous admission.  She demonstrates impaired cognition, impaired Lt UE function, impaired balance, and generalized weakness.  She currently requires mod- max A for ADLs.  Daughter unable to provide this level of care at discharge.  Recommend SNF level rehab.       Follow Up Recommendations  SNF    Equipment Recommendations  None recommended by OT    Recommendations for Other Services       Precautions / Restrictions Precautions Precautions: Fall Precaution Comments: Daughter reports Dr Gladstone Lighter removed previous long arm cast and gave pt a splint to wear mostly for night time use       Mobility Bed Mobility                  Transfers Overall transfer level: Needs assistance Equipment used: 1 person hand held assist;2 person hand held assist Transfers: Sit to/from [facility] Sit to Stand: Min assist Stand pivot transfers: Min assist;+2 physical assistance       General transfer comment: Pt requires max coaxing.  She is very fearful of standing.  She stood with min A x 1 with OT, but c/o dizziness.  BP stable.  Second stand, she requested her son also assist and she required min A ++2    Balance Overall balance assessment: Needs assistance Sitting-balance support: Feet  supported Sitting balance-Leahy Scale: Fair     Standing balance support: Single extremity supported Standing balance-Leahy Scale: Poor                              ADL Overall ADL's : Needs assistance/impaired Eating/Feeding: Set up;Sitting   Grooming: Wash/dry hands;Wash/dry face;Oral care;Brushing hair;Set up;Supervision/safety;Sitting   Upper Body Bathing: Moderate assistance;Sitting   Lower Body Bathing: Maximal assistance;Sit to/from stand   Upper Body Dressing : Moderate assistance;Sitting   Lower Body Dressing: Maximal assistance;Sit to/from stand   Toilet Transfer: Minimal assistance;+2 for physical assistance;Stand-pivot;BSC   Toileting- Clothing Manipulation and Hygiene: Total assistance;Sit to/from stand       Functional mobility during ADLs: Minimal assistance;+2 for physical assistance General ADL Comments: Pt requires encouragement      Vision Additional Comments: Pt would not particiapte in visual assessment    Perception Perception Perception Tested?: Yes   Praxis Praxis Praxis tested?: Within functional limits    Pertinent Vitals/Pain Pain Assessment: No/denies pain     Hand Dominance Right   Extremity/Trunk Assessment Upper Extremity Assessment Upper Extremity Assessment: LUE deficits/detail LUE Deficits / Details: Pt tends to guard Lt UE.  She would only move shoulder with max prompting.  Gentle AAROM performed Lt shoulder to ~100-110 flexion and abduction    Lower Extremity Assessment Lower Extremity Assessment: Defer to PT evaluation   Cervical / Trunk Assessment  Cervical / Trunk Assessment: Normal   Communication Communication Communication: No difficulties   Cognition Arousal/Alertness: Awake/alert Behavior During Therapy: WFL for tasks assessed/performed Overall Cognitive Status: Impaired/Different from baseline Area of Impairment: Attention;Memory;Safety/judgement;Awareness;Problem solving Orientation Level:  Time Current Attention Level: Sustained Memory: Decreased recall of precautions;Decreased short-term memory Following Commands: Follows one step commands consistently Safety/Judgement: Decreased awareness of safety;Decreased awareness of deficits Awareness: Emergent Problem Solving: Slow processing;Decreased initiation;Difficulty sequencing;Requires verbal cues;Requires tactile cues General Comments: Pt with intermittent confusion during OT session    General Comments       Exercises       Shoulder Instructions      Home Living Family/patient expects to be discharged to:: Skilled nursing facility                                        Prior Functioning/Environment Level of Independence: Needs assistance  Gait / Transfers Assistance Needed: pt indicates since fall she only ambulates short distances in the house with a cane ADL's / Homemaking Assistance Needed: A with ADLs sicne fall and daughter performs all homemaking tasks.        OT Diagnosis: Generalized weakness;Cognitive deficits   OT Problem List: Decreased strength;Decreased range of motion;Decreased activity tolerance;Impaired balance (sitting and/or standing);Decreased cognition;Decreased safety awareness;Decreased knowledge of use of DME or AE;Decreased knowledge of precautions;Impaired UE functional use   OT Treatment/Interventions: Self-care/ADL training;Therapeutic exercise;DME and/or AE instruction;Therapeutic activities;Cognitive remediation/compensation;Patient/family education;Balance training    OT Goals(Current goals can be found in the care plan section) Acute Rehab OT Goals Patient Stated Goal: to get better  OT Goal Formulation: With patient/family Time For Goal Achievement: 02/21/16 Potential to Achieve Goals: Good ADL Goals Pt Will Perform Grooming: with min assist;standing Pt Will Perform Upper Body Bathing: with set-up;with supervision;sitting Pt Will Perform Lower Body Bathing:  with min assist;sit to/from stand Pt Will Transfer to Toilet: with min assist;ambulating;regular height toilet;bedside commode;grab bars Pt Will Perform Toileting - Clothing Manipulation and hygiene: with min assist;sit to/from stand Pt/caregiver will Perform Home Exercise Program: Increased strength;Increased ROM;Left upper extremity;With Supervision;With written HEP provided  OT Frequency: Min 2X/week   Barriers to D/C: Decreased caregiver support          Co-evaluation              End of Session Nurse Communication: Mobility status  Activity Tolerance: Patient tolerated treatment well Patient left: in chair;with call bell/phone within reach;with chair alarm set;with family/visitor present   Time: 1317-1405 OT Time Calculation (min): 48 min Charges:  OT General Charges $OT Visit: 1 Procedure OT Evaluation $OT Eval Moderate Complexity: 1 Procedure OT Treatments $Therapeutic Activity: 23-37 mins G-Codes:    Tiare Rohlman M Feb 28, 2016, 8:00 PM

## 2016-02-07 NOTE — Evaluation (Signed)
Speech Language Pathology Evaluation Patient Details Name: Carolyn [name] MRN: [id number] DOB: [birth year] Today's Date: 02/07/2016 Time: 1047-1100 SLP Time Calculation (min) (ACUTE ONLY): 13 min  Problem List:  Patient Active Problem List   Diagnosis Date Noted  . Subdural hematoma ([place]) 02/05/2016  . Chronic bronchitis ([place])   . SDH (subdural hematoma) ([place]) 01/20/2016  . Fall 01/20/2016  . Closed fracture of left elbow 01/20/2016  . Diabetes mellitus without complication ([place])   . Hypercholesterolemia   . COPD (chronic obstructive pulmonary disease) ([place])   . Hypothyroid   . Essential hypertension   . Inguinal hernia unilateral, non-recurrent, left 12/02/2011   Past Medical History:  Past Medical History  Diagnosis Date  . Diabetes mellitus   . Hypertension   . Hypercholesterolemia   . Gallstones   . Glaucoma   . Cataracts, bilateral   . COPD (chronic obstructive pulmonary disease) ([place])   . Hypothyroid   . PONV (postoperative nausea and vomiting)   . Asthma   . Chronic kidney disease 2007    kidney cancer and partial nephrctomy-right  . Subdural bleeding [facility])    Past Surgical History:  Past Surgical History  Procedure Laterality Date  . Dilation and curettage of uterus    . Partial nephrectomy      cancer  . Breast biopsy    . D&c for enlarged uterine  210  . Total abdominal hysterectomy w/ bilateral salpingoophorectomy  2011  . Cateract extractions and iol    . Cholecystectomy  2008  . Abdominal hysterectomy    . Eye surgery  08/2011,09/2012    bilateral cataracts  . Hernia repair  01/19/12    inguinal  . Inguinal hernia repair  01/19/2012    Procedure: HERNIA REPAIR INGUINAL ADULT;  Surgeon: Earnstine Regal, MD;  Location: [facility];  Service: General;  Laterality: Left;  repair left inguinal hernia with mesh    HPI:  82 y.o. female presents with fall in early March sustaining bil L>R SDHs with L SDH enlarging and causing partial seizures. pt now s/p L  Craniotomy and Evacuation of SDH. During fall pt also sustained L Radial Head fx. pt with hx of DM, HTN, Glaucoma, Bil Cataract Extractions, COPD, CKD, and per pt Arthritis in Bil knees and hips.    Assessment / Plan / Recommendation Clinical Impression  Pt with limited willingness to participate in evaluation; she is fatigued with low volume voice and some irritability.  Son present for assessment.  Presents with clear articulation; no obvious language deficits; mild deficits in attention and short-term recall.  Recommend ongoing SLP assessment to evaluate cognition over the next several days and determine need for f/u after D/C.    SLP Assessment  Patient needs continued Speech Lanaguage Pathology Services    Follow Up Recommendations       Frequency and Duration min 2x/week  1 week      SLP Evaluation Prior Functioning  Cognitive/Linguistic Baseline: Within functional limits Type of Home: House Available Help at Discharge: Family;Available 24 hours/day   Cognition  Overall Cognitive Status: Impaired/Different from baseline Arousal/Alertness: Awake/alert Orientation Level: Oriented X4 Attention: Focused Focused Attention: Appears intact Memory: Impaired Memory Impairment: Retrieval deficit;Decreased short term memory Decreased Short Term Memory: Verbal basic Problem Solving: Impaired Problem Solving Impairment: Verbal basic Behaviors: Poor frustration tolerance Safety/Judgment: Impaired    Comprehension  Auditory Comprehension Overall Auditory Comprehension: Appears within functional limits for tasks assessed Reading Comprehension Reading Status: Not tested    Expression Expression  Primary Mode of Expression: Verbal Written Expression Dominant Hand: Right Written Expression: Not tested   Oral / Motor  Oral Motor/Sensory Function Overall Oral Motor/Sensory Function: Within functional limits   GO                    Juan Quam Laurice 02/07/2016, 11:05  AM

## 2016-02-07 NOTE — Anesthesia Postprocedure Evaluation (Signed)
Anesthesia Post Note  Patient: Carolyn [name]  Procedure(s) Performed: Procedure(s) (LRB): CRANIOTOMY HEMATOMA EVACUATION SUBDURAL (Left)  Patient location during evaluation: ICU Anesthesia Type: General Level of consciousness: sedated, patient cooperative and oriented Pain management: pain level controlled Vital Signs Assessment: post-procedure vital signs reviewed and stable Respiratory status: spontaneous breathing, nonlabored ventilation, respiratory function stable and patient connected to nasal cannula oxygen Cardiovascular status: blood pressure returned to baseline and stable Postop Assessment: no signs of nausea or vomiting Anesthetic complications: no Comments: Late entry: pt eval in ICU    Last Vitals:  Filed Vitals:   02/07/16 1400 02/07/16 1459  BP: 120/59 125/58  Pulse: 74 62  Temp:  36.6 C  Resp:  18    Last Pain:  Filed Vitals:   02/07/16 1459  PainSc: 4                  Ronin Crager,E. Brodin Gelpi

## 2016-02-08 MED ORDER — PANTOPRAZOLE SODIUM 40 MG PO TBEC
40.0000 mg | DELAYED_RELEASE_TABLET | Freq: Every day | ORAL | Status: DC
  Administered 2016-02-08 – 2016-02-10 (×3): 40 mg via ORAL
  Filled 2016-02-08 (×4): qty 1

## 2016-02-08 NOTE — Progress Notes (Signed)
Patient ID: ARLOW GERLEMAN, female   DOB: 1933-05-31, 82 y.o.   MRN: [id number] Mentally awake,complains of pain all over. Wound dry. No weakness.

## 2016-02-08 NOTE — Plan of Care (Signed)
Problem: Education: Goal: Knowledge of the prescribed therapeutic regimen will improve Outcome: Not Progressing Patient has dementia

## 2016-02-09 ENCOUNTER — Inpatient Hospital Stay (HOSPITAL_COMMUNITY): Payer: PPO

## 2016-02-09 DIAGNOSIS — I6200 Nontraumatic subdural hemorrhage, unspecified: Secondary | ICD-10-CM | POA: Diagnosis not present

## 2016-02-09 LAB — GLUCOSE, CAPILLARY: Glucose-Capillary: 114 mg/dL — ABNORMAL HIGH (ref 65–99)

## 2016-02-09 NOTE — Progress Notes (Signed)
Patient ID: Carolyn [name], female   DOB: 1933-08-17, 82 y.o.   MRN: [id number] Better, spoke with family. thei are requesting to be transfer to the rehab center

## 2016-02-09 NOTE — NC FL2 (Signed)
[place] MEDICAID FL2 LEVEL OF CARE SCREENING TOOL     IDENTIFICATION  Patient Name: Carolyn [name] Birthdate: Dec 28, 1932 Sex: female Admission Date (Current Location): 02/05/2016  [facility] and [place] Number:  Herbalist and Address:  [facility], [place] [address] [place], [place] [address]      Provider Number: [id number]  Attending Physician Name and Address:  Kevan Ny Ditty, MD  Relative Name and Phone Number:       Current Level of Care: Hospital Recommended Level of Care: [place] Prior Approval Number:    Date Approved/Denied:   PASRR Number: [id number]  Discharge Plan: SNF    Current Diagnoses: Patient Active Problem List   Diagnosis Date Noted  . Subdural hematoma ([place]) 02/05/2016  . Chronic bronchitis ([place])   . SDH (subdural hematoma) ([place]) 01/20/2016  . Fall 01/20/2016  . Closed fracture of left elbow 01/20/2016  . Diabetes mellitus without complication ([place])   . Hypercholesterolemia   . COPD (chronic obstructive pulmonary disease) ([place])   . Hypothyroid   . Essential hypertension   . Inguinal hernia unilateral, non-recurrent, left 12/02/2011    Orientation RESPIRATION BLADDER Height & Weight     Self, Time, Situation, Place  Normal   Weight: 199 lb 1.2 oz (90.3 kg) Height:  5\' 6"  (167.6 cm)  BEHAVIORAL SYMPTOMS/MOOD NEUROLOGICAL BOWEL NUTRITION STATUS      Continent Diet (Carb modified)  AMBULATORY STATUS COMMUNICATION OF NEEDS Skin   Limited Assist Verbally Surgical wounds                       Personal Care Assistance Level of Assistance  Feeding, Dressing, Bathing Bathing Assistance: Limited assistance Feeding assistance: Independent Dressing Assistance: Limited assistance     Functional Limitations Info             SPECIAL CARE FACTORS FREQUENCY  PT (By licensed PT), OT (By licensed OT)     PT Frequency: daily OT Frequency: daily            Contractures  Contractures Info: Not present    Additional Factors Info  Code Status, Allergies Code Status Info: Darvocet, metformin and related, percocet, vicodin             Current Medications (02/09/2016):  This is the current hospital active medication list Current Facility-Administered Medications  Medication Dose Route Frequency Provider Last Rate Last Dose  . 0.9 %  sodium chloride infusion   Intravenous Continuous Kevan Ny Ditty, MD 100 mL/hr at 02/06/16 2000    . acetaminophen (TYLENOL) tablet 650 mg  650 mg Oral Q4H PRN Kevan Ny Ditty, MD   650 mg at 02/08/16 2217   Or  . acetaminophen (TYLENOL) suppository 650 mg  650 mg Rectal Q4H PRN Kevan Ny Ditty, MD      . albuterol (PROVENTIL) (2.5 MG/3ML) 0.083% nebulizer solution 2.5 mg  2.5 mg Inhalation Q4H PRN Kevan Ny Ditty, MD      . bisacodyl (DULCOLAX) EC tablet 5 mg  5 mg Oral Daily PRN Kevan Ny Ditty, MD      . docusate sodium (COLACE) capsule 100 mg  100 mg Oral BID Kevan Ny Ditty, MD   100 mg at 02/08/16 [id number]  . glimepiride (AMARYL) tablet 4 mg  4 mg Oral Q supper Kevan Ny Ditty, MD   4 mg at 02/08/16 1654  . hydrALAZINE (APRESOLINE) injection 5-10 mg  5-10 mg Intravenous Q1H PRN [name]  Tamala Fothergill, MD      . HYDROmorphone (DILAUDID) injection 0.5-1 mg  0.5-1 mg Intravenous Q2H PRN Kevan Ny Ditty, MD   1 mg at 02/07/16 1122  . labetalol (NORMODYNE,TRANDATE) injection 10-40 mg  10-40 mg Intravenous Q10 min PRN Kevan Ny Ditty, MD      . latanoprost (XALATAN) 0.005 % ophthalmic solution 1 drop  1 drop Both Eyes QHS Tamala Fothergill, MD   1 drop at 02/08/16 2221  . levETIRAcetam (KEPPRA) 750 mg in sodium chloride 0.9 % 100 mL IVPB  750 mg Intravenous Q12H Kevan Ny Ditty, MD   750 mg at 02/08/16 2224  . lisinopril (PRINIVIL,ZESTRIL) tablet 20 mg  20 mg Oral QHS Kevan Ny Ditty, MD   20 mg at 02/08/16 2217  . mometasone-formoterol (DULERA) 200-5 MCG/ACT inhaler 2 puff  2  puff Inhalation BID Kevan Ny Ditty, MD   2 puff at 02/05/16 2215  . naloxone [facility]) injection 0.08 mg  0.08 mg Intravenous PRN Kevan Ny Ditty, MD      . ondansetron [facility]) tablet 4 mg  4 mg Oral Q4H PRN Kevan Ny Ditty, MD       Or  . ondansetron [facility]) injection 4 mg  4 mg Intravenous Q4H PRN Kevan Ny Ditty, MD      . pantoprazole (PROTONIX) EC tablet 40 mg  40 mg Oral QHS Kevan Ny Ditty, MD   40 mg at 02/08/16 2218  . polyethylene glycol (MIRALAX / GLYCOLAX) packet 17 g  17 g Oral Daily PRN Kevan Ny Ditty, MD      . senna [facility]) tablet 8.6 mg  1 tablet Oral BID Kevan Ny Ditty, MD   8.6 mg at 02/08/16 [id number]  . sodium phosphate (FLEET) 7-19 GM/118ML enema 1 enema  1 enema Rectal Once PRN Kevan Ny Ditty, MD      . theophylline (UNIPHYL) 400 MG 24 hr tablet 400 mg  400 mg Oral QHS Kevan Ny Ditty, MD   400 mg at 02/08/16 2218  . thyroid (ARMOUR) tablet 60 mg  60 mg Oral QHS Kevan Ny Ditty, MD   60 mg at 02/08/16 2219  . verapamil (CALAN-SR) CR tablet 180 mg  180 mg Oral QHS Kevan Ny Ditty, MD   180 mg at 02/08/16 2219     Discharge Medications: Please see discharge summary for a list of discharge medications.  Relevant Imaging Results:  Relevant Lab Results:   Additional Information SSN: [id number]  Dulcy Fanny, LCSW

## 2016-02-09 NOTE — Clinical Social Work Note (Signed)
Clinical Social Work Assessment  Patient Details  Name: Carolyn [name] MRN: [id number] Date of Birth: 08/25/1933  Date of referral:  02/09/16               Reason for consult:  Facility Placement                Permission sought to share information with:  Family Supports, Chartered certified accountant granted to share information::  Yes, Verbal Permission Granted  Name::      (Carolyn [name])  Agency::   ([facility] including [facility])  Relationship::     Contact Information:     Housing/Transportation Living arrangements for the past 2 months:  [place] of Information:  Patient, Adult Children Patient Interpreter Needed:  None Criminal Activity/Legal Involvement Pertinent to Current Situation/Hospitalization:  No - Comment as needed Significant Relationships:  Adult Children Lives with:    Do you feel safe going back to the place where you live?  Yes Need for family participation in patient care:  Yes (Comment) (patient requests decisions to be made by [name], Carolyn)  Care giving concerns:  [name] states patient's IV has infiltrated and expresses frustration.  CSW offered support and encouraged [name] to review concerns with medical staff.   Social Worker assessment / plan:  CSW spoke with patient who states her [name], Carolyn [name] would be handling decisions for her.  CSW received verbal permission to contact [name].  CSW contacted [name] Carolyn [name] [phone], who reports that patient is from home with her ([name]) and spouse.  [name] reports being an Therapist, sports who is a full-time caregiver for both the patient and the patient's spouse.  [name] expressed caregiver fatigue in caring for both parents who have major physical deficits which requires constant attention and care.  CSW offered support and encouragement to [name].  [name] states she is interested in [facility] (as the "doctor mentioned this" to her at bedside).  CSW educated patient  in regards to PT's recommendation being SNF.  [name] acknowledged patient may be too weak for CIR, but wishes for consult to be placed to "check and see".  CSW advised weekend [facility] of [name]'s request.  CSW received permission to facilitate SNF search for [facility] including [facility]  [name] is adamant in regards to patient not going to "a nursing home", but is agreeable to STR at [facility] at time of discharge IF CIR is not an option.  [name] very reserved in regards to placement because she doesn't want it to turn into long term care.  CSW provided education and support.  Employment status:  Retired Forensic scientist:  Programmer, applications ([employer]) PT Recommendations:    Information / Referral to community resources:  [place]  Patient/Family's Response to care:  Patient and [name] agreeable to STR at [facility]  Patient/Family's Understanding of and Emotional Response to Diagnosis, Current Treatment, and Prognosis:  [name] is realistic regarding level of care needed for patient though unsure if [name] is realistic regarding the level of care she can provide to both parents at time of patient's discharge as she says her dad has fallen 9 times in the past few months on top of having pneumonia.    Emotional Assessment Appearance:    Attitude/Demeanor/Rapport:    Affect (typically observed):  Accepting Orientation:  Oriented to Self, Oriented to Place, Oriented to  Time, Oriented to Situation Alcohol / Substance use:  Not Applicable Psych involvement (Current and /or in  the community):  No (Comment)  Discharge Needs  Concerns to be addressed:  No discharge needs identified Readmission within the last 30 days:  No Current discharge risk:  None Barriers to Discharge:  Continued Medical Work up   [employer], LCSW 02/09/2016, 10:00 AM

## 2016-02-09 NOTE — Clinical Social Work Placement (Signed)
   CLINICAL SOCIAL WORK PLACEMENT  NOTE  Date:  02/09/2016  Patient Details  Name: Carolyn [name] MRN: [id number] Date of Birth: May 11, 1933  Clinical Social Work is seeking post-discharge placement for this patient at the [place] level of care (*CSW will initial, date and re-position this form in  chart as items are completed):  Yes   Patient/family provided with [facility] Work Department's list of facilities offering this level of care within the geographic area requested by the patient (or if unable, by the patient's family).  Yes   Patient/family informed of their freedom to choose among providers that offer the needed level of care, that participate in Medicare, Medicaid or managed care program needed by the patient, have an available bed and are willing to accept the patient.  Yes   Patient/family informed of [facility]'s ownership interest in [facility] and [facility], as well as of the fact that they are under no obligation to receive care at these facilities.  PASRR submitted to EDS on 02/09/16     PASRR number received on 02/09/16     Existing PASRR number confirmed on       FL2 transmitted to all facilities in geographic area requested by pt/family on 02/09/16     FL2 transmitted to all facilities within larger geographic area on       Patient informed that his/her managed care company has contracts with or will negotiate with certain facilities, including the following:            Patient/family informed of bed offers received.  Patient chooses bed at       Physician recommends and patient chooses bed at      Patient to be transferred to   on  .  Patient to be transferred to facility by       Patient family notified on   of transfer.  Name of family member notified:        PHYSICIAN       Additional Comment:    _______________________________________________ Dulcy Fanny, LCSW 02/09/2016, 10:08 AM

## 2016-02-10 DIAGNOSIS — E118 Type 2 diabetes mellitus with unspecified complications: Secondary | ICD-10-CM

## 2016-02-10 DIAGNOSIS — I6200 Nontraumatic subdural hemorrhage, unspecified: Secondary | ICD-10-CM

## 2016-02-10 DIAGNOSIS — F068 Other specified mental disorders due to known physiological condition: Secondary | ICD-10-CM | POA: Diagnosis not present

## 2016-02-10 DIAGNOSIS — R296 Repeated falls: Secondary | ICD-10-CM | POA: Diagnosis not present

## 2016-02-10 DIAGNOSIS — S069XAA Unspecified intracranial injury with loss of consciousness status unknown, initial encounter: Secondary | ICD-10-CM | POA: Insufficient documentation

## 2016-02-10 DIAGNOSIS — S069X9D Unspecified intracranial injury with loss of consciousness of unspecified duration, subsequent encounter: Secondary | ICD-10-CM

## 2016-02-10 DIAGNOSIS — S069X0S Unspecified intracranial injury without loss of consciousness, sequela: Secondary | ICD-10-CM

## 2016-02-10 DIAGNOSIS — S069X9A Unspecified intracranial injury with loss of consciousness of unspecified duration, initial encounter: Secondary | ICD-10-CM | POA: Insufficient documentation

## 2016-02-10 DIAGNOSIS — I10 Essential (primary) hypertension: Secondary | ICD-10-CM

## 2016-02-10 DIAGNOSIS — R000 Tachycardia, unspecified: Secondary | ICD-10-CM

## 2016-02-10 DIAGNOSIS — J449 Chronic obstructive pulmonary disease, unspecified: Secondary | ICD-10-CM | POA: Insufficient documentation

## 2016-02-10 MED ORDER — LEVETIRACETAM 500 MG/5ML IV SOLN
1000.0000 mg | Freq: Two times a day (BID) | INTRAVENOUS | Status: DC
  Filled 2016-02-10: qty 10

## 2016-02-10 MED ORDER — LEVETIRACETAM 500 MG PO TABS
1000.0000 mg | ORAL_TABLET | Freq: Two times a day (BID) | ORAL | Status: DC
  Administered 2016-02-10 – 2016-02-12 (×4): 1000 mg via ORAL
  Filled 2016-02-10 (×4): qty 2

## 2016-02-10 NOTE — Consult Note (Signed)
Physical Medicine and Rehabilitation Consult Reason for Consult: Traumatic subdural hematoma Referring Physician: Dr. Cyndy Freeze   HPI: Carolyn [name] is a 82 y.o. right handed female with history of hypertension, diabetes mellitus, COPD. By chart review patient lives with husband and daughter in [place]. She used a walker prior to admission. One level home with 2 steps to entry. Daughter is an Therapist, sports who is a full-time caregiver for both the patient and the patient's spouse. Patient with with history of subdural hematoma after multiple falls beginning in March 2017 as well as left radial head fracture. Family notes patient with persistent headaches. Patient with recent simple partial seizure  and presented to the emergency department 02/06/2016. Cranial CT scan revealed enlarging chronic subdural hematoma. Underwent left craniotomy for evacuation of subdural hematoma 02/06/2016 per Dr. Cyndy Freeze. Maintained on Keppra for suspect seizure. Tolerating a regular consistency diet. Repeat head CT on 3/26 suggesting increased pain midline shift. Physical occupational therapy evaluations completed and ongoing. M.D. has requested physical medicine rehabilitation consult.  Review of Systems  Constitutional: Negative for fever and chills.  Eyes: Negative for blurred vision and double vision.  Respiratory: Negative for cough and shortness of breath.   Cardiovascular: Negative for chest pain, palpitations and leg swelling.  Gastrointestinal: Positive for constipation. Negative for nausea and vomiting.  Genitourinary: Negative for dysuria and hematuria.  Musculoskeletal: Positive for myalgias, back pain, joint pain and falls.  Skin: Negative for rash.  Neurological: Positive for weakness and headaches. Negative for loss of consciousness.  All other systems reviewed and are negative.  Past Medical History  Diagnosis Date  . Diabetes mellitus   . Hypertension   . Hypercholesterolemia   .  Gallstones   . Glaucoma   . Cataracts, bilateral   . COPD (chronic obstructive pulmonary disease) ([place])   . Hypothyroid   . PONV (postoperative nausea and vomiting)   . Asthma   . Chronic kidney disease 2007    kidney cancer and partial nephrctomy-right  . Subdural bleeding [facility])    Past Surgical History  Procedure Laterality Date  . Dilation and curettage of uterus    . Partial nephrectomy      cancer  . Breast biopsy    . D&c for enlarged uterine  210  . Total abdominal hysterectomy w/ bilateral salpingoophorectomy  2011  . Cateract extractions and iol    . Cholecystectomy  2008  . Abdominal hysterectomy    . Eye surgery  08/2011,09/2012    bilateral cataracts  . Hernia repair  01/19/12    inguinal  . Inguinal hernia repair  01/19/2012    Procedure: HERNIA REPAIR INGUINAL ADULT;  Surgeon: Earnstine Regal, MD;  Location: [facility] ORS;  Service: General;  Laterality: Left;  repair left inguinal hernia with mesh   . Craniotomy Left 02/06/2016    Procedure: CRANIOTOMY HEMATOMA EVACUATION SUBDURAL;  Surgeon: Kevan Ny Ditty, MD;  Location: [facility]UR[facility] ORS;  Service: Neurosurgery;  Laterality: Left;   Family History  Problem Relation Age of Onset  . Cancer     Social History:  reports that she quit smoking about 34 years ago. Her smoking use included Cigarettes. She has a 5 pack-year smoking history. She has never used smokeless tobacco. She reports that she does not drink alcohol or use illicit drugs. Allergies:  Allergies  Allergen Reactions  . Darvocet [Propoxyphene N-Acetaminophen] Nausea And Vomiting  . Metformin And Related Diarrhea  . Percocet [Oxycodone-Acetaminophen] Nausea And Vomiting  .  Vicodin [Hydrocodone-Acetaminophen] Itching   Medications Prior to Admission  Medication Sig Dispense Refill  . Fluticasone-Salmeterol (ADVAIR) 250-50 MCG/DOSE AEPB Inhale 1 puff into the lungs 2 (two) times daily as needed (for wheezing/shortness of breath).     [name] glimepiride (AMARYL) 2 MG  tablet Take 4 mg by mouth at bedtime.     [name] latanoprost (XALATAN) 0.005 % ophthalmic solution Place 1 drop into both eyes at bedtime.     [name] lisinopril (PRINIVIL,ZESTRIL) 20 MG tablet Take 20 mg by mouth at bedtime.     . pirbuterol (MAXAIR) 200 MCG/INH inhaler Inhale 2 puffs into the lungs 4 (four) times daily as needed for wheezing or shortness of breath.     . polyethylene glycol (MIRALAX / GLYCOLAX) packet Take 17 g by mouth daily as needed for mild constipation.     . theophylline (UNIPHYL) 400 MG 24 hr tablet Take 400 mg by mouth at bedtime.     [name] thyroid (ARMOUR) 60 MG tablet Take 60 mg by mouth at bedtime.     . verapamil (CALAN-SR) 180 MG CR tablet Take 180 mg by mouth at bedtime.      Home: Home Living Family/patient expects to be discharged to:: Skilled nursing facility Living Arrangements: Spouse/significant other Available Help at Discharge: Family, Available 24 hours/day Type of Home: House  Functional History: Prior Function Level of Independence: Needs assistance Gait / Transfers Assistance Needed: pt indicates since fall she only ambulates short distances in the house with a cane ADL's / Homemaking Assistance Needed: A with ADLs sicne fall and daughter performs all homemaking tasks. Functional Status:  Mobility: Bed Mobility Overal bed mobility: Needs Assistance, +2 for physical assistance Bed Mobility: Supine to Sit Supine to sit: Max assist, +2 for physical assistance, HOB elevated General bed mobility comments: pt needs cues for sequencing and encouragement.  pt needs A for Bil LEs and bringing trunk up to sitting.   Transfers Overall transfer level: Needs assistance Equipment used: 1 person hand held assist, 2 person hand held assist Transfers: Sit to/from Stand, Stand Pivot Transfers Sit to Stand: Min assist Stand pivot transfers: Min assist, +2 physical assistance General transfer comment: Pt requires max coaxing.  She is very fearful of standing.  She stood  with min A x 1 with OT, but c/o dizziness.  BP stable.  Second stand, she requested her son also assist and she required min A ++2      ADL: ADL Overall ADL's : Needs assistance/impaired Eating/Feeding: Set up, Sitting Grooming: Wash/dry hands, Wash/dry face, Oral care, Brushing hair, Set up, Supervision/safety, Sitting Upper Body Bathing: Moderate assistance, Sitting Lower Body Bathing: Maximal assistance, Sit to/from stand Upper Body Dressing : Moderate assistance, Sitting Lower Body Dressing: Maximal assistance, Sit to/from stand Toilet Transfer: Minimal assistance, +2 for physical assistance, Stand-pivot, BSC Toileting- Clothing Manipulation and Hygiene: Total assistance, Sit to/from stand Functional mobility during ADLs: Minimal assistance, +2 for physical assistance General ADL Comments: Pt requires encouragement   Cognition: Cognition Overall Cognitive Status: Impaired/Different from baseline Arousal/Alertness: Awake/alert Orientation Level: Oriented X4 Attention: Focused Focused Attention: Appears intact Memory: Impaired Memory Impairment: Retrieval deficit, Decreased short term memory Decreased Short Term Memory: Verbal basic Problem Solving: Impaired Problem Solving Impairment: Verbal basic Behaviors: Poor frustration tolerance Safety/Judgment: Impaired Cognition Arousal/Alertness: Awake/alert Behavior During Therapy: WFL for tasks assessed/performed Overall Cognitive Status: Impaired/Different from baseline Area of Impairment: Attention, Memory, Safety/judgement, Awareness, Problem solving Orientation Level: Time Current Attention Level: Sustained Memory: Decreased recall of precautions, Decreased short-term  memory Following Commands: Follows one step commands consistently Safety/Judgement: Decreased awareness of safety, Decreased awareness of deficits Awareness: Emergent Problem Solving: Slow processing, Decreased initiation, Difficulty sequencing, Requires  verbal cues, Requires tactile cues General Comments: Pt with intermittent confusion during OT session   Blood pressure 160/81, pulse 75, temperature 99.4 F (37.4 C), temperature source Oral, resp. rate 18, height 5\' 6"  (1.676 m), weight 90.3 kg (199 lb 1.2 oz), last menstrual period 03/24/2010, SpO2 100 %. Physical Exam  Vitals reviewed. Constitutional: She appears well-developed and well-nourished.  HENT:  Head: Normocephalic.  Craniotomy site clean and dry  Eyes:  Pt unwilling to open eyes  Neck: Normal range of motion. Neck supple. No thyromegaly present.  Cardiovascular: Normal rate and regular rhythm.   Respiratory: Effort normal and breath sounds normal. No respiratory distress.  GI: Soft. Bowel sounds are normal. She exhibits no distension.  Musculoskeletal: She exhibits no tenderness.  PROM WNL  Neurological:  Mood is flat.  Pt unwilling to open eyes, answer orientation questions, participate in MMT and sensation testing   Skin: Skin is warm and dry.  Psychiatric: She is agitated. Cognition and memory are impaired. She expresses inappropriate judgment.  Unwilling to engage  No results found for this or any previous visit (from the past 24 hour(s)). Ct Head Wo Contrast  02/09/2016  ADDENDUM REPORT: 02/09/2016 16:35 ADDENDUM: The results of this study were related to RN Hildred Alamin on the floor by the Radiologist Assistant, and communication documented in the PACS or zVision Dashboard. Electronically Signed   By: Genevie Ann M.D.   On: 02/09/2016 16:35  02/09/2016  CLINICAL DATA:  82 year old female status post craniotomy for treatment of subdural hematoma. Initial encounter. EXAM: CT HEAD WITHOUT CONTRAST TECHNIQUE: Contiguous axial images were obtained from the base of the skull through the vertex without intravenous contrast. COMPARISON:  Head CTs 02/05/2016 and earlier. FINDINGS: Interval left parietal craniotomy. Otherwise stable visualized osseous structures. Stable extensive frontal  and ethmoid sinus opacification. Mastoids and tympanic cavities remain clear. Postoperative changes to the scalp soft tissues. Stable orbits soft tissues. Continued mixed density left subdural hematoma, with superimposed small volume of pneumocephalus. Persistent rightward midline shift, which appears slightly increased from the preoperative study now 6 mm (versus 3 mm before). Effaced left lateral ventricle with no ventriculomegaly. There is a small volume of subdural hematoma along the interhemispheric fissure, primarily hypodense. Laterally the subdural collection measures 7-8 mm at most levels now versus 9 mm previously. Basilar cisterns remain patent. No other intracranial hemorrhage identified. No superimposed acute cortically based infarct identified. IMPRESSION: 1. Status post left parietal craniotomy. Small volume pneumocephalus. 2. Continued mixed density left subdural hematoma. Rightward midline shift has mildly increased, now 6 mm. 3. Effaced left lateral ventricle without ventriculomegaly. Basilar cisterns remain patent. Electronically Signed: By: Genevie Ann M.D. On: 02/09/2016 16:05    Assessment/Plan: Diagnosis: Traumatic subdural hematoma Labs and images independently reviewed.  Records reviewed and summated above.  [place] score:  ?IV  Speech to evaluate for Post traumatic amnesia and interval GOAT scores to assess progress.  NeuroPsych evaluation for behavorial assessment.  Provide environmental management by reducing the level of stimulation, tolerating restlessness when possible, protecting patient from harming self or others and reducing patient's cognitive confusion.  Address behavioral concerns include providing structured environments and daily routines.  Cognitive therapy to direct modular abilities in order to maintain goals  including problem solving, self regulation/monitoring, self management, attention, and memory.  Fall precautions; pt at risk  for second impact  syndrome  Prevention of secondary injury: monitor for hypotension, hypoxia, seizures or signs of increased ICP  Prophylactic AED:   Consider pharmacological intervention if necessary with neurostimulants, such as amantadine, methylphenidate, modafinil, etc.  Consider Propranolol for agitation and storming  Avoid medications that could impair cognitive abilities, such as anticholinergics, antihistaminic, benzodiazapines, narcotics, etc when possible  1. Does the need for close, 24 hr/day medical supervision in concert with the patient's rehab needs make it unreasonable for this patient to be served in a less intensive setting? Yes  2. Co-Morbidities requiring supervision/potential complications: history of subdural hematoma after multiple falls (educate pt and family regarding falls precautions), HTN (monitor and provide prns in accordance with increased physical exertion and pain), DM (Monitor in accordance with exercise and adjust meds as necessary), COPD (monitor RR and O2 sats with increased physical activity), Tachycardia (monitor in accordance with pain and increasing activity) 3. Due to bladder management, safety, skin/wound care, disease management, medication administration and patient education, does the patient require 24 hr/day rehab nursing? Yes 4. Does the patient require coordinated care of a physician, rehab nurse, PT (1-2 hrs/day, 5 days/week), OT (1-2 hrs/day, 5 days/week) and SLP (1-2 hrs/day, 5 days/week) to address physical and functional deficits in the context of the above medical diagnosis(es)? Yes Addressing deficits in the following areas: balance, endurance, locomotion, strength, transferring, bowel/bladder control, bathing, feeding, grooming, toileting, cognition and psychosocial support 5. Can the patient actively participate in an intensive therapy program of at least 3 hrs of therapy per day at least 5 days per week? Potentially 6. The potential for patient to make  measurable gains while on inpatient rehab is excellent 7. Anticipated functional outcomes upon discharge from inpatient rehab are min assist and mod assist  with PT, min assist and mod assist with OT, min assist and mod assist with SLP. 8. Estimated rehab length of stay to reach the above functional goals is: 20-24 days. 9. Does the patient have adequate social supports and living environment to accommodate these discharge functional goals? Potentially 10. Anticipated D/C setting: Home 11. Anticipated post D/C treatments: HH therapy and Home excercise program 12. Overall Rehab/Functional Prognosis: good and fair  RECOMMENDATIONS: This patient's condition is appropriate for continued rehabilitative care in the following setting: Patient require max assist with most therapies. She also is not able/unwilling to tolerate 3 hours of therapy per day. At this time, agree with physical therapy, recommend SNF. Patient has agreed to participate in recommended program. No Note that insurance prior authorization may be required for reimbursement for recommended care.  Comment: Rehab Admissions Coordinator to follow up.  Delice Lesch, MD 02/10/2016

## 2016-02-10 NOTE — Progress Notes (Signed)
Physical Therapy Treatment Patient Details Name: Carolyn [name] MRN: [id number] DOB: 08-23-1933 Today's Date: 02/10/2016    History of Present Illness pt presents with fall in early March sustaining bil L>R SDHs with L SDH enlarging and causing partial seizures.  pt now s/p L Craniotomy and Evacuation of SDH.  During fall pt also sustained L Radial Head fx.  pt with hx of DM, HTN, Glaucoma, Bil Cataract Extractions, COPD, CKD, and per pt Arthritis in Bil knees and hips.      PT Comments    Pt progressing slowly, family requests CIR consult. Unsure if pt will be able to participate 3 hours per day but she and daughter express that she would like to try. Min A +2 for short ambulation to chair and pt getting up to [facility] for toileting. PT will continue to follow.   Follow Up Recommendations  CIR     Equipment Recommendations  None recommended by PT    Recommendations for Other Services Rehab consult     Precautions / Restrictions Precautions Precautions: Fall Precaution Comments: Daughter reports Dr Gladstone Lighter removed previous long arm cast and gave pt a splint to wear mostly for night time use  Required Braces or Orthoses: Sling Restrictions Weight Bearing Restrictions: No    Mobility  Bed Mobility Overal bed mobility: Needs Assistance Bed Mobility: Supine to Sit Rolling: Supervision Sidelying to sit: Min assist;HOB elevated       General bed mobility comments: pt initiated bed mobility to vc's, rolled to right side and initiated sitting up, min A at trunk to complete task  Transfers Overall transfer level: Needs assistance Equipment used: 2 person hand held assist Transfers: Sit to/from [facility] Sit to Stand: Min assist Stand pivot transfers: Min assist;+2 safety/equipment       General transfer comment: pt very fatigued and had been up to [facility] recently to treatment, min A to stand, +2 HHA for short distance ambulation to chair. Small steps with narrow  BOS. No dizziness noted  Ambulation/Gait Ambulation/Gait assistance: Min assist;+2 safety/equipment Ambulation Distance (Feet): 2 Feet Assistive device: 2 person hand held assist Gait Pattern/deviations: Narrow base of support     General Gait Details: ambulated to chair only due to fatigue   Stairs            Wheelchair Mobility    Modified Rankin (Stroke Patients Only)       Balance Overall balance assessment: Needs assistance Sitting-balance support: No upper extremity supported;Feet supported   Sitting balance - Comments: able to sit EOB without UE support   Standing balance support: Bilateral upper extremity supported Standing balance-Leahy Scale: Poor Standing balance comment: UE support needed to maintain standing                    Cognition Arousal/Alertness: Awake/alert Behavior During Therapy: WFL for tasks assessed/performed Overall Cognitive Status: Impaired/Different from baseline Area of Impairment: Attention;Memory;Safety/judgement;Awareness;Problem solving Orientation Level: Time Current Attention Level: Sustained Memory: Decreased recall of precautions;Decreased short-term memory Following Commands: Follows one step commands consistently Safety/Judgement: Decreased awareness of safety;Decreased awareness of deficits Awareness: Emergent Problem Solving: Slow processing;Decreased initiation;Difficulty sequencing;Requires verbal cues;Requires tactile cues General Comments: pt with decreased verbalization during session but was appropriate when she did speak and followed one step commands consistently today    Exercises General Exercises - Lower Extremity Ankle Circles/Pumps: AROM;Both;10 reps;Seated Long Arc Quad: AROM;Both;5 reps;Seated    General Comments General comments (skin integrity, edema, etc.): daughter would really like pt to  go to inpt rehab as opposed to SNF      Pertinent Vitals/Pain Pain Assessment: No/denies pain Pain  Location: pt did not c/o HA but dtr reports that she has had this morning    Home Living                      Prior Function            PT Goals (current goals can now be found in the care plan section) Acute Rehab PT Goals Patient Stated Goal: to get better  PT Goal Formulation: With patient/family Time For Goal Achievement: 02/21/16 Potential to Achieve Goals: Good Progress towards PT goals: Progressing toward goals    Frequency  Min 3X/week    PT Plan Discharge plan needs to be updated    Co-evaluation             End of Session Equipment Utilized During Treatment: Gait belt Activity Tolerance: Patient limited by fatigue Patient left: in chair;with call bell/phone within reach;with family/visitor present (chair alarm under pt, dtr to plug in if leaving room)     Time: [id number] PT Time Calculation (min) (ACUTE ONLY): 23 min  Charges:  $Therapeutic Activity: 23-37 mins                    G Codes:     Leighton Roach, PT  Acute Rehab Services  [place], [place] 02/10/2016, 1:28 PM

## 2016-02-10 NOTE — Progress Notes (Signed)
Upon return from [place] at approximately 94, pt's daughter called RN to room to assess patient's speech. States pt slurring words to the point of being incomprehensible. Performed NIH scale to assess for stroke. Pt's score 0 and no slurring noted. Grip strength strong and equal. No drift x 4 extremities and vision changes that were previously assessed now resolved. Pt states she is tired and wants to sleep. Pt has not slept much during the day. Daughter also states this could be the result of the [place]. Explained would leave note for Dr. Cyndy Freeze to review in the AM. Daughter agrees. Wendee Copp

## 2016-02-10 NOTE — Progress Notes (Signed)
No acute events, but possible intermittent speech slurring AVSS Awake and alert Oriented x3 Bandage c/d/i Stable Continue therapy Increase Keppra

## 2016-02-10 NOTE — Progress Notes (Signed)
I met with pt at bedside for assessment and then contacted her daughter [name] by phone to discuss inpt rehab venue. We have scheduled to meet tomorrow at 1030 to continue discussions. I await pt's ability to tolerate more intense therapies. [phone] 

## 2016-02-10 NOTE — Care Management Important Message (Signed)
Important Message  Patient Details  Name: Carolyn [name] MRN: [id number] Date of Birth: May 11, 1933   Medicare Important Message Given:  Yes    Loann Quill 02/10/2016, 12:59 PM

## 2016-02-11 NOTE — Progress Notes (Signed)
Occupational Therapy Treatment Patient Details Name: Carolyn [name] MRN: [id number] DOB: [birth year] Today's Date: 02/11/2016    History of present illness pt presents with fall in early March sustaining bil L>R SDHs with L SDH enlarging and causing partial seizures.  pt now s/p L Craniotomy and Evacuation of SDH.  During fall pt also sustained L Radial Head fx.  pt with hx of DM, HTN, Glaucoma, Bil Cataract Extractions, COPD, CKD, and per pt Arthritis in Bil knees and hips.     OT comments  Pt much more participatory today and is very motivated to regain independence.   Currently, she requires min - mod A with ADLs.  Recommend CIR.   Follow Up Recommendations  CIR    Equipment Recommendations  3 in 1 bedside comode    Recommendations for Other Services Rehab consult    Precautions / Restrictions Precautions Precautions: Fall Precaution Comments: Daughter reports Dr Gladstone Lighter removed previous long arm cast and gave pt a splint to wear mostly for night time use        Mobility Bed Mobility Overal bed mobility: Needs Assistance Bed Mobility: Supine to Sit   Sidelying to sit: Min assist       General bed mobility comments: assist to lift shoulders   Transfers Overall transfer level: Needs assistance Equipment used: Rolling walker (2 wheeled) Transfers: Sit to/from [facility] Sit to Stand: Min guard Stand pivot transfers: Min assist            Balance Overall balance assessment: Needs assistance Sitting-balance support: Feet supported Sitting balance-Leahy Scale: Fair     Standing balance support: Bilateral upper extremity supported Standing balance-Leahy Scale: Poor                     ADL Overall ADL's : Needs assistance/impaired                     Lower Body Dressing: Moderate assistance;Sit to/from stand Lower Body Dressing Details (indicate cue type and reason): able to don socks with min A  Toilet Transfer: Minimal  assistance;Ambulation;Comfort height toilet;BSC;Grab bars;RW [employer] technical [employer] Details (indicate cue type and reason): Pt requries assist to maneuver RW in small spaces.          Functional mobility during ADLs: Minimal assistance;Rolling walker        Vision Eye Alignment: Within Functional Limits     Tracking/Visual Pursuits: Able to track stimulus in all quads without difficulty         Additional Comments: No visual field deficit noted during visual assessment    Perception     Praxis      Cognition   Behavior During Therapy: Flat affect Overall Cognitive Status: Impaired/Different from baseline Area of Impairment: Attention;Safety/judgement;Problem solving   Current Attention Level: Sustained Memory: Decreased short-term memory  Following Commands: Follows one step commands consistently Safety/Judgement: Decreased awareness of safety;Decreased awareness of deficits Awareness: Emergent Problem Solving: Slow processing;Decreased initiation;Difficulty sequencing;Requires verbal cues General Comments: Pt quiet and reserved this date     Extremity/Trunk Assessment               Exercises     Shoulder Instructions       General Comments      Pertinent Vitals/ Pain       Pain Assessment: No/denies pain  Home Living   Living Arrangements: Spouse/significant other;Children ([name], daughter cares for both her parents)     Home Access: Stairs to enter Entrance  Stairs-Number of Steps: 2 Entrance Stairs-Rails: Left;Right;Can reach both Home Layout: One level     Bathroom Shower/Tub: Tub/shower unit;Walk-in shower   Bathroom Toilet: Handicapped height Bathroom Accessibility: Yes How Accessible: Accessible via walker     Additional Comments: daughter lives w/ pt and pt's husband.  Pt's husband mainly sits or stays in bed and is not very active, requiring assist from daughter for ADLs and ambulation.  Lives With: Daughter;Spouse    Prior  Functioning/Environment              Frequency Min 2X/week     Progress Toward Goals  OT Goals(current goals can now be found in the care plan section)  Progress towards OT goals: Progressing toward goals  ADL Goals Pt Will Perform Grooming: with min assist;standing Pt Will Perform Upper Body Bathing: with set-up;with supervision;sitting Pt Will Perform Lower Body Bathing: with min assist;sit to/from stand Pt Will Transfer to Toilet: with min assist;ambulating;regular height toilet;bedside commode;grab bars Pt Will Perform Toileting - Clothing Manipulation and hygiene: with min assist;sit to/from stand Pt/caregiver will Perform Home Exercise Program: Increased strength;Increased ROM;Left upper extremity;With Supervision;With written HEP provided  Plan Discharge plan needs to be updated    Co-evaluation                 End of Session Equipment Utilized During Treatment: Rolling walker;Gait belt   Activity Tolerance Patient tolerated treatment well   Patient Left in chair;with call bell/phone within reach;with chair alarm set   Nurse Communication Mobility status        Time: [id number] OT Time Calculation (min): 33 min  Charges: OT General Charges $OT Visit: 1 Procedure OT Treatments $Self Care/Home Management : 23-37 mins  Ura Yingling M 02/11/2016, 5:15 PM

## 2016-02-11 NOTE — Progress Notes (Signed)
No acute events Awake and alert Oriented Follows commands Stable Continue therapy

## 2016-02-11 NOTE — Progress Notes (Addendum)
Physical Therapy Treatment Patient Details Name: Carolyn [name] MRN: [id number] DOB: [birth year] Today's Date: 02/11/2016    History of Present Illness pt presents with fall in early March sustaining bil L>R SDHs with L SDH enlarging and causing partial seizures.  pt now s/p L Craniotomy and Evacuation of SDH.  During fall pt also sustained L Radial Head fx.  pt with hx of DM, HTN, Glaucoma, Bil Cataract Extractions, COPD, CKD, and per pt Arthritis in Bil knees and hips.      PT Comments    Pt with increased participation today.  Eyes open throughout session and ambulated 24', 36', and then to bathroom and back with RW with MIN A . Pt running into items on R side.  She could correct after hitting them, but unable to anticipate.  Decreased peripheral vision with gross vision test.  She also needed cues to place R hand on RW and fully grip.  If pt continues to progress she may be able to tolerate CIR.  Will continue to assess.  Follow Up Recommendations  CIR     Equipment Recommendations  None recommended by PT    Recommendations for Other Services       Precautions / Restrictions Precautions Precautions: Fall Precaution Comments: Carolyn [name] reports Carolyn [name] removed previous long arm cast and gave pt a splint to wear mostly for night time use  Required Braces or Orthoses: Sling (did not wear during session. Night only per Carolyn [name]) Restrictions Weight Bearing Restrictions: No    Mobility  Bed Mobility               General bed mobility comments: up in chair upon arrival  Transfers Overall transfer level: Needs assistance Equipment used: Rolling walker (2 wheeled) Transfers: Sit to/from Stand Sit to Stand: Min guard         General transfer comment: Sit to stand from recliner x 3 and BSC (over toilet) x 1 following instructions for hand placement 25% of the time.  Ambulation/Gait Ambulation/Gait assistance: Min assist;+2 safety/equipment Ambulation Distance (Feet):  24 Feet (plus 58' with sitting break in between then to BR & back) Assistive device: Rolling walker (2 wheeled) Gait Pattern/deviations: Decreased step length - right;Decreased step length - left;Shuffle;Drifts right/left;Narrow base of support;Trunk flexed Gait velocity: decreased   General Gait Details: 2nd person chair follow. Shuffeling gait pattern with head down and not able to look forward despite cueing.  Runs into objects on her R side.  Once she hits items, she is able to correct, but not able to anticipate obstacle.  Shuffeling gait pattern.   Stairs            Wheelchair Mobility    Modified Rankin (Stroke Patients Only)       Balance Overall balance assessment: Needs assistance         Standing balance support: Bilateral upper extremity supported Standing balance-Leahy Scale: Poor Standing balance comment: requires RW                    Cognition Arousal/Alertness: Awake/alert Behavior During Therapy: WFL for tasks assessed/performed;Flat affect Overall Cognitive Status: Impaired/Different from baseline Area of Impairment: Attention;Memory;Safety/judgement;Awareness     Memory: Decreased recall of precautions;Decreased short-term memory Following Commands: Follows one step commands inconsistently;Follows one step commands with increased time Safety/Judgement: Decreased awareness of safety;Decreased awareness of deficits Awareness: Emergent Problem Solving: Slow processing;Decreased initiation;Difficulty sequencing;Requires verbal cues;Requires tactile cues General Comments: Pt with flat affect, but did everything that was asked of  her.  Eyes open throughout session.    Exercises      General Comments General comments (skin integrity, edema, etc.): Carolyn [name] present.  She assisted pt with peri-care after using toilet      Pertinent Vitals/Pain Pain Assessment: No/denies pain Faces Pain Scale: No hurt    Home Living                       Prior Function            PT Goals (current goals can now be found in the care plan section) Acute Rehab PT Goals Patient Stated Goal: to get better  PT Goal Formulation: With patient/family Time For Goal Achievement: 02/21/16 Potential to Achieve Goals: Good Progress towards PT goals: Progressing toward goals    Frequency  Min 3X/week    PT Plan Current plan remains appropriate    Co-evaluation             End of Session Equipment Utilized During Treatment: Gait belt Activity Tolerance: Patient tolerated treatment well Patient left: in chair;with call bell/phone within reach;with chair alarm set;with family/visitor present (with lunch tray)     Time: [id number] PT Time Calculation (min) (ACUTE ONLY): 24 min  Charges:  $Gait Training: 8-22 mins $Therapeutic Activity: 8-22 mins                    G Codes:      Carolyn [name] 02/11/2016, 12:47 PM

## 2016-02-11 NOTE — Progress Notes (Signed)
Pt refused x2 attempt by nurse and tech to get up in chair. Daughter came in spoke with nurse encouraged pt to get up pt agreed. Pt sitting up in chair at this time. No complaints of pain or discomfort. No noted distress. Safety measures in place. Call bell within reach. Will continue to monitor.

## 2016-02-11 NOTE — PMR Pre-admission (Signed)
PMR Admission Coordinator Pre-Admission Assessment  Patient: Carolyn [name] is an 82 y.o., female MRN: [id number] DOB: 1933-01-19 Height: 5\' 6"  (167.6 cm) Weight: 90.3 kg (199 lb 1.2 oz)              Insurance Information HMO:     PPO: yes     PCP:      IPA:      80/20:      OTHER: medicare advantage plan PRIMARY: [employer]      Policy#: [id number]      Subscriber: pt CM Name: Erik Obey     Phone#: [phone]     Fax#: [phone] Pre-Cert#: [id number]    Employer: retired  approved for 7 days 3/29 thru 02/18/16 when update is due Benefits:  Phone #: [phone]     Name: 02/11/16 Eff. Date: 11/17/15     Deduct: none      Out of Pocket Max: $3400      Life Max: none CIR: $225 per day days 1-6; then covers 100%      SNF: no copay for days 1-20; $150 copay per day days 21-100 Outpatient: $15 copay per visit     Co-Pay: no visit limit Home Health: $25 copay per visit      Co-Pay: no visit limit DME: 80%     Co-Pay: 20% Providers: in network  SECONDARY: none        Medicaid Application Date:       Case Manager:  Disability Application Date:       Case Worker:   Emergency Contact Information Contact Information    Name Relation Home Work Mobile   Reynolds,[name] [phone]  [phone]   Nemiah, Ledezma Daughter [phone]     Donte, Ennen Spouse [phone]       Current Medical History  Patient Admitting Diagnosis: Traumatic Subdural Hematoma  History of Present Illness: Carolyn [name] is a 82 y.o. right handed female with history of hypertension, diabetes mellitus, COPD. By chart review patient lives with husband and daughter in [place].  Daughter is an Therapist, sports who is a full-time caregiver for both the patient and the patient's spouse. Patient with with history of subdural hematoma after multiple falls beginning in March 2017 as well as left radial head fracture. Family notes patient with persistent headaches. Patient with recent simple partial  seizure and presented to the emergency department 02/06/2016. Cranial CT scan revealed enlarging chronic subdural hematoma. Underwent left craniotomy for evacuation of subdural hematoma 02/06/2016 per Dr. Cyndy Freeze. Maintained on Keppra for suspect seizure. Tolerating a regular consistency diet. Repeat head CT on 3/26 suggesting increased pain midline shift.     Past Medical History  Past Medical History  Diagnosis Date  . Diabetes mellitus   . Hypertension   . Hypercholesterolemia   . Gallstones   . Glaucoma   . Cataracts, bilateral   . COPD (chronic obstructive pulmonary disease) ([place])   . Hypothyroid   . PONV (postoperative nausea and vomiting)   . Asthma   . Chronic kidney disease 2007    kidney cancer and partial nephrctomy-right  . Subdural bleeding [facility])     Family History  family history is not on file.  Prior Rehab/Hospitalizations:  Has the patient had major surgery during 100 days prior to admission? No  Current Medications   Current facility-administered medications:  .  0.9 %  sodium chloride infusion, , Intravenous, Continuous, Kevan Ny Ditty, MD, Last Rate: 100 mL/hr at 02/09/16 2204 .  acetaminophen (TYLENOL) tablet 650 mg, 650 mg, Oral, Q4H PRN, 325 mg at 02/10/16 2234 **OR** acetaminophen (TYLENOL) suppository 650 mg, 650 mg, Rectal, Q4H PRN, Kevan Ny Ditty, MD .  albuterol (PROVENTIL) (2.5 MG/3ML) 0.083% nebulizer solution 2.5 mg, 2.5 mg, Inhalation, Q4H PRN, Kevan Ny Ditty, MD .  bisacodyl (DULCOLAX) EC tablet 5 mg, 5 mg, Oral, Daily PRN, Kevan Ny Ditty, MD .  docusate sodium (COLACE) capsule 100 mg, 100 mg, Oral, BID, Kevan Ny Ditty, MD, 100 mg at 02/12/16 0917 .  glimepiride (AMARYL) tablet 4 mg, 4 mg, Oral, Q supper, Kevan Ny Ditty, MD, 4 mg at 02/11/16 1722 .  hydrALAZINE (APRESOLINE) injection 5-10 mg, 5-10 mg, Intravenous, Q1H PRN, Kevan Ny Ditty, MD .  HYDROmorphone (DILAUDID) injection 0.5-1 mg, 0.5-1 mg,  Intravenous, Q2H PRN, Kevan Ny Ditty, MD, 1 mg at 02/07/16 1122 .  labetalol (NORMODYNE,TRANDATE) injection 10-40 mg, 10-40 mg, Intravenous, Q10 min PRN, Kevan Ny Ditty, MD .  latanoprost (XALATAN) 0.005 % ophthalmic solution 1 drop, 1 drop, Both Eyes, QHS, Tamala Fothergill, MD, 1 drop at 02/11/16 2104 .  levETIRAcetam (KEPPRA) tablet 1,000 mg, 1,000 mg, Oral, BID, Kevan Ny Ditty, MD, 1,000 mg at 02/12/16 0917 .  lisinopril (PRINIVIL,ZESTRIL) tablet 20 mg, 20 mg, Oral, QHS, Kevan Ny Ditty, MD, 20 mg at 02/11/16 2105 .  naloxone [facility]) injection 0.08 mg, 0.08 mg, Intravenous, PRN, Kevan Ny Ditty, MD .  ondansetron [facility]) tablet 4 mg, 4 mg, Oral, Q4H PRN **OR** ondansetron (ZOFRAN) injection 4 mg, 4 mg, Intravenous, Q4H PRN, Kevan Ny Ditty, MD .  pantoprazole (PROTONIX) EC tablet 40 mg, 40 mg, Oral, QHS, Kevan Ny Ditty, MD, 40 mg at 02/10/16 2221 .  polyethylene glycol (MIRALAX / GLYCOLAX) packet 17 g, 17 g, Oral, Daily PRN, Kevan Ny Ditty, MD .  senna [facility]) tablet 8.6 mg, 1 tablet, Oral, BID, Kevan Ny Ditty, MD, 8.6 mg at 02/12/16 0917 .  sodium phosphate (FLEET) 7-19 GM/118ML enema 1 enema, 1 enema, Rectal, Once PRN, Kevan Ny Ditty, MD .  theophylline (UNIPHYL) 400 MG 24 hr tablet 400 mg, 400 mg, Oral, QHS, Kevan Ny Ditty, MD, 400 mg at 02/11/16 2105 .  thyroid (ARMOUR) tablet 60 mg, 60 mg, Oral, QHS, Kevan Ny Ditty, MD, 60 mg at 02/11/16 2106 .  verapamil (CALAN-SR) CR tablet 180 mg, 180 mg, Oral, QHS, Kevan Ny Ditty, MD, 180 mg at 02/11/16 2106  Patients Current Diet: Diet Carb Modified Fluid consistency:: Thin; Room service appropriate?: Yes  Precautions / Restrictions Precautions Precautions: Fall Precaution Comments: Daughter reports Dr Gladstone Lighter removed previous long arm cast and gave pt a splint to wear mostly for night time use  Restrictions Weight Bearing Restrictions: No   Has the patient had 2  or more falls or a fall with injury in the past year?Yes  Prior Activity Level Limited Community (1-2x/wk): Pt independent and driving prior to initial injury Loved working in her flowers prior to initial fall. Walked her dog. Napped  After lunch   Home Assistive Devices / [place] Devices/Equipment: Gilford Rile (specify type)  Prior Device Use: Indicate devices/aids used by the patient prior to current illness, exacerbation or injury? Walker and cane pta.   Prior Functional Level Prior Function Level of Independence: Independent Gait / Transfers Assistance Needed: pt indicates since fall she only ambulates short distances in the house with a cane ADL's / Homemaking Assistance Needed: A with ADLs sicne fall and daughter performs all homemaking tasks. 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  Independent  Indoor Mobility: Did the patient need assistance with walking from room to room (with or without device)? Independent  Stairs: Did the patient need assistance with internal or external stairs (with or without device)? Independent  Functional Cognition: Did the patient need help planning regular tasks such as shopping or remembering to take medications? Independent  Current Functional Level Cognition  Arousal/Alertness: Awake/alert Overall Cognitive Status: Impaired/Different from baseline Current Attention Level: Sustained Orientation Level: Oriented X4 Following Commands: Follows one step commands consistently Safety/Judgement: Decreased awareness of safety, Decreased awareness of deficits General Comments: Pt quiet and reserved this date  Attention: Focused Focused Attention: Appears intact Memory: Impaired Memory Impairment: Retrieval deficit, Decreased short term memory Decreased Short Term Memory: Verbal basic Problem  Solving: Impaired Problem Solving Impairment: Verbal basic Behaviors: Poor frustration tolerance Safety/Judgment: Impaired    Extremity Assessment (includes Sensation/Coordination)  Upper Extremity Assessment: LUE deficits/detail LUE Deficits / Details: Pt tends to guard Lt UE.  She would only move shoulder with max prompting.  Gentle AAROM performed Lt shoulder to ~100-110 flexion and abduction   Lower Extremity Assessment: Defer to PT evaluation    ADLs  Overall ADL's : Needs assistance/impaired Eating/Feeding: Set up, Sitting Grooming: Wash/dry hands, Wash/dry face, Oral care, Brushing hair, Set up, Supervision/safety, Sitting Upper Body Bathing: Moderate assistance, Sitting Lower Body Bathing: Maximal assistance, Sit to/from stand Upper Body Dressing : Moderate assistance, Sitting Lower Body Dressing: Moderate assistance, Sit to/from stand Lower Body Dressing Details (indicate cue type and reason): able to don socks with min A  Toilet Transfer: Minimal assistance, Ambulation, Comfort height toilet, BSC, Grab bars, RW Toilet Transfer Details (indicate cue type and reason): Pt requries assist to maneuver RW in small spaces.  Toileting- Clothing Manipulation and Hygiene: Total assistance, Sit to/from stand Functional mobility during ADLs: Minimal assistance, Rolling walker General ADL Comments: Pt requires encouragement     Mobility  Overal bed mobility: Needs Assistance Bed Mobility: Supine to Sit Rolling: Supervision Sidelying to sit: Min assist Supine to sit: Max assist, +2 for physical assistance, HOB elevated General bed mobility comments: assist to lift shoulders     Transfers  Overall transfer level: Needs assistance Equipment used: Rolling walker (2 wheeled) Transfers: Sit to/from Stand, Stand Pivot Transfers Sit to Stand: Min guard Stand pivot transfers: Min assist General transfer comment: Sit to stand from recliner x 3 and BSC (over toilet) x 1 following  instructions for hand placement 25% of the time.    Ambulation / Gait / Stairs / Wheelchair Mobility  Ambulation/Gait Ambulation/Gait assistance: Min assist, +2 safety/equipment Ambulation Distance (Feet): 24 Feet (plus 36' with sitting break in between then to BR & back) Assistive device: Rolling walker (2 wheeled) Gait Pattern/deviations: Decreased step length - right, Decreased step length - left, Shuffle, Drifts right/left, Narrow base of support, Trunk flexed General Gait Details: 2nd person chair follow. Shuffeling gait pattern with head down and not able to look forward despite cueing.  Runs into objects on her R side.  Once she hits items, she is able to correct, but not able to anticipate obstacle.  Shuffeling gait pattern. Gait velocity: decreased Number of Stairs: 2    Posture / Balance Dynamic Sitting Balance Sitting balance - Comments: able to sit EOB without UE support Balance Overall balance assessment: Needs assistance Sitting-balance support: Feet supported  Sitting balance-Leahy Scale: Fair Sitting balance - Comments: able to sit EOB without UE support Standing balance support: Bilateral upper extremity supported Standing balance-Leahy Scale: Poor Standing balance comment: requires RW    Special needs/care consideration Skin surgical incision Bowel mgmt: continent Bladder mgmt: continent Diabetic mgmt yes   Previous Home Environment Living Arrangements: Spouse/significant other, Children  Lives With: Daughter, Spouse Available Help at Discharge: Family, Available 24 hours/day Type of Home: House Home Layout: One level Home Access: Stairs to enter Entrance Stairs-Rails: Left, Right, Can reach both Entrance Stairs-Number of Steps: 2 Bathroom Shower/Tub: Tub/shower unit, Multimedia programmer: Handicapped height Bathroom Accessibility: Yes How Accessible: Accessible via walker Home Care Services: No Additional Comments: daughter lives w/ pt and pt's  husband.  Pt's husband mainly sits or stays in bed and is not very active, requiring assist from daughter for ADLs and ambulation.  Discharge Living Setting Plans for Discharge Living Setting: Patient's home, Lives with (comment) Type of Home at Discharge: House Discharge Home Layout: One level Discharge Home Access: Stairs to enter Entrance Stairs-Rails: Right, Left, Can reach both Entrance Stairs-Number of Steps: 2 Discharge Bathroom Shower/Tub: Tub/shower unit, Walk-in shower Discharge Bathroom Toilet: Handicapped height Discharge Bathroom Accessibility: Yes How Accessible: Accessible via walker Does the patient have any problems obtaining your medications?: No  Social/Family/Support Systems Patient Roles: Spouse, Parent Contact Information: [name], daughter Anticipated Caregiver: daughter Anticipated Ambulance person Information: see above Ability/Limitations of Caregiver: daughter RN of 32 yrs, states she has a bad back  Caregiver Availability: 24/7 Discharge Plan Discussed with Primary Caregiver: Yes Is Caregiver In Agreement with Plan?: Yes Does Caregiver/Family have Issues with Lodging/Transportation while Pt is in Rehab?: No Daughter is retired Engineer, drilling since January. She is caregiver for her parents. Pt's spouse has fallen 9 times since January. Pt tripped on her dog.  Goals/Additional Needs Patient/Family Goal for Rehab: supervision to min assist with PT, OT and SLP Expected length of stay: [facility]OS 20-24 days Pt/Family Agrees to Admission and willing to participate: Yes Program Orientation Provided & Reviewed with Pt/Caregiver Including Roles  & Responsibilities: Yes  Decrease burden of Care through IP rehab admission: n/a  Possible need for SNF placement upon discharge: daughter refuses any discussion of SNF.  Patient Condition: This patient's medical and functional status has changed since the consult dated: min to mod assist in which the Rehabilitation Physician  determined and documented that the patient's condition is appropriate for intensive rehabilitative care in an inpatient rehabilitation facility. See "History of Present Illness" (above) for medical update. Functional changes are: min to mod assist. Patient's medical and functional status update has been discussed with the Rehabilitation physician and patient remains appropriate for inpatient rehabilitation. Will admit to inpatient rehab today.  Preadmission Screen Completed By:  Cleatrice Burke, 02/12/2016 12:11 PM ______________________________________________________________________   Discussed status with Dr. Posey Pronto on 02/12/2016 at  1211 and received telephone approval for admission today.  Admission Coordinator:  Cleatrice Burke, time [id number] Date 02/12/2016.

## 2016-02-11 NOTE — Care Management Note (Signed)
Case Management Note  Patient Details  Name: Carolyn [name] MRN: [id number] Date of Birth: October 15, 1933  Subjective/Objective:    Patient is s/p craniotomy. She is from home with her spouse.                Action/Plan: PT recommending CIR. CM will continue to follow for d/c needs.   Expected Discharge Date:                  Expected Discharge Plan:     In-House Referral:     Discharge planning Services     Post Acute Care Choice:    Choice offered to:     DME Arranged:    DME Agency:     HH Arranged:    HH Agency:     Status of Service:  In process, will continue to follow  Medicare Important Message Given:  Yes Date Medicare IM Given:    Medicare IM give by:    Date Additional Medicare IM Given:    Additional Medicare Important Message give by:     If discussed at [place] of Stay Meetings, dates discussed:    Additional Comments:  Pollie Friar, RN 02/11/2016, 12:08 PM

## 2016-02-11 NOTE — Progress Notes (Signed)
I met with pt and her daughter, Carolyn [name], at bedside to discuss rehab goals. Pt would maintain eyes closed when spoken to unless pressed to open. Dr. Cyndy Freeze in to speak with pt and pt alert and talkative. I explained to pt, Dr. Cyndy Freeze and daughter that pt needs to demonstrate tolerance to do more intense therapies by sitting up in chair at intervals throughout the day alternating with times to rest. I discussed that I would follow up with them tomorrow and also discuss with nursing. Daughter is refusing SNF at this time but needs pt to be at supervision level before returning home where she can provide supervision 24/7. I discussed with RN as well as therapy after meeting with pt and daughter at bedside. I will follow up tomorrow. [phone]

## 2016-02-11 NOTE — Progress Notes (Signed)
Pt up in chair during each meal. She ambulates with rolling walker stand by assist x1.  Pt denies pain or discomfort. No noted distress.

## 2016-02-11 NOTE — Progress Notes (Signed)
Noted pt improved with participation and tolerance with therapy today. I have begun insurance authorization for a possible inpt rehab admission. I will follow up tomorrow. [id number]

## 2016-02-12 ENCOUNTER — Inpatient Hospital Stay (HOSPITAL_COMMUNITY)
Admission: RE | Admit: 2016-02-12 | Discharge: 2016-02-17 | DRG: 103 | Disposition: A | Discharge status: Other Acute Inpatient Hospital | Payer: PPO | Source: Intra-hospital | Attending: Physical Medicine & Rehabilitation | Admitting: Physical Medicine & Rehabilitation

## 2016-02-12 DIAGNOSIS — E039 Hypothyroidism, unspecified: Secondary | ICD-10-CM | POA: Diagnosis present

## 2016-02-12 DIAGNOSIS — K5900 Constipation, unspecified: Secondary | ICD-10-CM | POA: Diagnosis present

## 2016-02-12 DIAGNOSIS — R569 Unspecified convulsions: Secondary | ICD-10-CM

## 2016-02-12 DIAGNOSIS — E1142 Type 2 diabetes mellitus with diabetic polyneuropathy: Secondary | ICD-10-CM | POA: Diagnosis not present

## 2016-02-12 DIAGNOSIS — R4189 Other symptoms and signs involving cognitive functions and awareness: Secondary | ICD-10-CM | POA: Diagnosis present

## 2016-02-12 DIAGNOSIS — R413 Other amnesia: Secondary | ICD-10-CM | POA: Diagnosis present

## 2016-02-12 DIAGNOSIS — S069X9S Unspecified intracranial injury with loss of consciousness of unspecified duration, sequela: Secondary | ICD-10-CM | POA: Insufficient documentation

## 2016-02-12 DIAGNOSIS — R1310 Dysphagia, unspecified: Secondary | ICD-10-CM | POA: Diagnosis not present

## 2016-02-12 DIAGNOSIS — F918 Other conduct disorders: Secondary | ICD-10-CM | POA: Diagnosis not present

## 2016-02-12 DIAGNOSIS — J449 Chronic obstructive pulmonary disease, unspecified: Secondary | ICD-10-CM | POA: Diagnosis not present

## 2016-02-12 DIAGNOSIS — Z9181 History of falling: Secondary | ICD-10-CM | POA: Diagnosis not present

## 2016-02-12 DIAGNOSIS — S069XAS Unspecified intracranial injury with loss of consciousness status unknown, sequela: Secondary | ICD-10-CM | POA: Insufficient documentation

## 2016-02-12 DIAGNOSIS — R296 Repeated falls: Secondary | ICD-10-CM | POA: Diagnosis present

## 2016-02-12 DIAGNOSIS — S069X0S Unspecified intracranial injury without loss of consciousness, sequela: Secondary | ICD-10-CM

## 2016-02-12 DIAGNOSIS — S065XAA Traumatic subdural hemorrhage with loss of consciousness status unknown, initial encounter: Secondary | ICD-10-CM

## 2016-02-12 DIAGNOSIS — G44309 Post-traumatic headache, unspecified, not intractable: Secondary | ICD-10-CM | POA: Diagnosis not present

## 2016-02-12 DIAGNOSIS — G4089 Other seizures: Secondary | ICD-10-CM | POA: Diagnosis not present

## 2016-02-12 DIAGNOSIS — E7800 Pure hypercholesterolemia, unspecified: Secondary | ICD-10-CM | POA: Diagnosis present

## 2016-02-12 DIAGNOSIS — I10 Essential (primary) hypertension: Secondary | ICD-10-CM | POA: Diagnosis not present

## 2016-02-12 DIAGNOSIS — Z85528 Personal history of other malignant neoplasm of kidney: Secondary | ICD-10-CM | POA: Diagnosis not present

## 2016-02-12 DIAGNOSIS — F0281 Dementia in other diseases classified elsewhere with behavioral disturbance: Secondary | ICD-10-CM

## 2016-02-12 DIAGNOSIS — F068 Other specified mental disorders due to known physiological condition: Secondary | ICD-10-CM

## 2016-02-12 DIAGNOSIS — R5383 Other fatigue: Secondary | ICD-10-CM

## 2016-02-12 DIAGNOSIS — Z905 Acquired absence of kidney: Secondary | ICD-10-CM

## 2016-02-12 DIAGNOSIS — R4701 Aphasia: Secondary | ICD-10-CM | POA: Diagnosis present

## 2016-02-12 DIAGNOSIS — Z87891 Personal history of nicotine dependence: Secondary | ICD-10-CM

## 2016-02-12 DIAGNOSIS — F02818 Dementia in other diseases classified elsewhere, unspecified severity, with other behavioral disturbance: Secondary | ICD-10-CM | POA: Insufficient documentation

## 2016-02-12 DIAGNOSIS — W19XXXS Unspecified fall, sequela: Secondary | ICD-10-CM | POA: Diagnosis present

## 2016-02-12 DIAGNOSIS — R519 Headache, unspecified: Secondary | ICD-10-CM

## 2016-02-12 DIAGNOSIS — R4689 Other symptoms and signs involving appearance and behavior: Secondary | ICD-10-CM | POA: Insufficient documentation

## 2016-02-12 DIAGNOSIS — R51 Headache: Principal | ICD-10-CM | POA: Diagnosis present

## 2016-02-12 DIAGNOSIS — F0151 Vascular dementia with behavioral disturbance: Secondary | ICD-10-CM | POA: Diagnosis not present

## 2016-02-12 DIAGNOSIS — S065X9A Traumatic subdural hemorrhage with loss of consciousness of unspecified duration, initial encounter: Secondary | ICD-10-CM | POA: Diagnosis present

## 2016-02-12 DIAGNOSIS — S065X0S Traumatic subdural hemorrhage without loss of consciousness, sequela: Secondary | ICD-10-CM

## 2016-02-12 LAB — GLUCOSE, CAPILLARY: Glucose-Capillary: 122 mg/dL — ABNORMAL HIGH (ref 65–99)

## 2016-02-12 MED ORDER — THEOPHYLLINE ER 400 MG PO TB24
400.0000 mg | ORAL_TABLET | Freq: Every day | ORAL | Status: DC
  Administered 2016-02-12 – 2016-02-16 (×5): 400 mg via ORAL
  Filled 2016-02-12 (×6): qty 1

## 2016-02-12 MED ORDER — ACETAMINOPHEN 325 MG PO TABS
650.0000 mg | ORAL_TABLET | ORAL | Status: DC | PRN
  Administered 2016-02-13 – 2016-02-14 (×2): 650 mg via ORAL
  Filled 2016-02-12 (×2): qty 2

## 2016-02-12 MED ORDER — DOCUSATE SODIUM 100 MG PO CAPS
100.0000 mg | ORAL_CAPSULE | Freq: Two times a day (BID) | ORAL | Status: DC
Start: 2016-02-12 — End: 2016-02-16
  Administered 2016-02-12 – 2016-02-16 (×8): 100 mg via ORAL
  Filled 2016-02-12 (×10): qty 1

## 2016-02-12 MED ORDER — LISINOPRIL 20 MG PO TABS
20.0000 mg | ORAL_TABLET | Freq: Every day | ORAL | Status: DC
  Administered 2016-02-12 – 2016-02-16 (×5): 20 mg via ORAL
  Filled 2016-02-12 (×5): qty 1

## 2016-02-12 MED ORDER — PANTOPRAZOLE SODIUM 40 MG PO TBEC
40.0000 mg | DELAYED_RELEASE_TABLET | Freq: Every day | ORAL | Status: DC
  Administered 2016-02-12 – 2016-02-16 (×5): 40 mg via ORAL
  Filled 2016-02-12 (×5): qty 1

## 2016-02-12 MED ORDER — POLYETHYLENE GLYCOL 3350 17 G PO PACK: 17.0000 g | PACK | Freq: Every day | ORAL | Status: DC | PRN

## 2016-02-12 MED ORDER — ACETAMINOPHEN 650 MG RE SUPP: 650.0000 mg | RECTAL | Status: DC | PRN

## 2016-02-12 MED ORDER — SORBITOL 70 % SOLN: 30.0000 mL | Freq: Every day | Status: DC | PRN

## 2016-02-12 MED ORDER — THYROID 60 MG PO TABS
60.0000 mg | ORAL_TABLET | Freq: Every day | ORAL | Status: DC
  Administered 2016-02-12 – 2016-02-16 (×5): 60 mg via ORAL
  Filled 2016-02-12 (×5): qty 1

## 2016-02-12 MED ORDER — VERAPAMIL HCL ER 180 MG PO TBCR
180.0000 mg | EXTENDED_RELEASE_TABLET | Freq: Every day | ORAL | Status: DC
  Administered 2016-02-12 – 2016-02-16 (×5): 180 mg via ORAL
  Filled 2016-02-12 (×6): qty 1

## 2016-02-12 MED ORDER — LATANOPROST 0.005 % OP SOLN
1.0000 [drp] | Freq: Every day | OPHTHALMIC | Status: DC
  Administered 2016-02-12 – 2016-02-16 (×5): 1 [drp] via OPHTHALMIC
  Filled 2016-02-12 (×2): qty 2.5

## 2016-02-12 MED ORDER — ONDANSETRON HCL 4 MG/2ML IJ SOLN
4.0000 mg | Freq: Four times a day (QID) | INTRAMUSCULAR | Status: DC | PRN
Start: 2016-02-12 — End: 2016-02-17

## 2016-02-12 MED ORDER — ONDANSETRON HCL 4 MG PO TABS: 4.0000 mg | ORAL_TABLET | Freq: Four times a day (QID) | ORAL | Status: DC | PRN

## 2016-02-12 MED ORDER — LEVETIRACETAM 500 MG PO TABS
1000.0000 mg | ORAL_TABLET | Freq: Two times a day (BID) | ORAL | Status: DC
  Administered 2016-02-12 – 2016-02-14 (×5): 1000 mg via ORAL
  Filled 2016-02-12 (×7): qty 2

## 2016-02-12 MED ORDER — GLIMEPIRIDE 2 MG PO TABS
4.0000 mg | ORAL_TABLET | Freq: Every day | ORAL | Status: DC
  Administered 2016-02-13 – 2016-02-14 (×2): 4 mg via ORAL
  Filled 2016-02-12 (×3): qty 2

## 2016-02-12 MED ORDER — BISACODYL 5 MG PO TBEC
5.0000 mg | DELAYED_RELEASE_TABLET | Freq: Every day | ORAL | Status: DC | PRN
  Administered 2016-02-16: 5 mg via ORAL
  Filled 2016-02-12: qty 1

## 2016-02-12 MED ORDER — LEVETIRACETAM 1000 MG PO TABS: 1000.0000 mg | ORAL_TABLET | Freq: Two times a day (BID) | ORAL | Status: DC

## 2016-02-12 MED ORDER — ALBUTEROL SULFATE (2.5 MG/3ML) 0.083% IN NEBU: 2.5000 mg | INHALATION_SOLUTION | RESPIRATORY_TRACT | Status: DC | PRN

## 2016-02-12 NOTE — H&P (View-Only) (Signed)
Physical Medicine and Rehabilitation Admission H&P    Chief Complaint  Patient presents with  . Headache  . Emesis  : HPI: Carolyn [name] is a 82 y.o. right handed female with history of hypertension, diabetes mellitus, COPD. By chart review patient lives with husband and daughter in [place]. She used a walker prior to admission. One level home with 2 steps to entry. Daughter is an Therapist, sports who is a full-time caregiver for both the patient and the patient's spouse. Patient with with history of subdural hematoma after multiple falls beginning in March 2017 as well as left radial head fracture. Family notes patient with persistent headaches. Patient with recent simple partial seizure and presented to the emergency department 02/06/2016. Cranial CT scan revealed enlarging chronic subdural hematoma. Underwent left craniotomy for evacuation of subdural hematoma 02/06/2016 per Dr. Cyndy Freeze. Maintained on Keppra for suspect seizure. Tolerating a regular consistency diet. Repeat head CT on 3/26 suggesting increased pain midline shift. PhysicalAnd occupational therapy evaluations completed and ongoing. M.D. has requested physical medicine rehabilitation consult.Patient was admitted for comprehensive rehabilitation program  ROS Constitutional: Negative for fever and chills.  Eyes: Negative for blurred vision and double vision.  Respiratory: Negative for cough and shortness of breath.  Cardiovascular: Negative for chest pain, palpitations and leg swelling.  Gastrointestinal: Positive for constipation. Negative for nausea and vomiting.  Genitourinary: Negative for dysuria and hematuria.  Musculoskeletal: Positive for myalgias, back pain, joint pain and falls.  Skin: Negative for rash.  Neurological: Positive for weakness and headaches. Negative for loss of consciousness.  All other systems reviewed and are negative   Past Medical History  Diagnosis Date  . Diabetes mellitus   .  Hypertension   . Hypercholesterolemia   . Gallstones   . Glaucoma   . Cataracts, bilateral   . COPD (chronic obstructive pulmonary disease) ([place])   . Hypothyroid   . PONV (postoperative nausea and vomiting)   . Asthma   . Chronic kidney disease 2007    kidney cancer and partial nephrctomy-right  . Subdural bleeding [facility])    Past Surgical History  Procedure Laterality Date  . Dilation and curettage of uterus    . Partial nephrectomy      cancer  . Breast biopsy    . D&c for enlarged uterine  210  . Total abdominal hysterectomy w/ bilateral salpingoophorectomy  2011  . Cateract extractions and iol    . Cholecystectomy  2008  . Abdominal hysterectomy    . Eye surgery  08/2011,09/2012    bilateral cataracts  . Hernia repair  01/19/12    inguinal  . Inguinal hernia repair  01/19/2012    Procedure: HERNIA REPAIR INGUINAL ADULT;  Surgeon: Earnstine Regal, MD;  Location: [facility] ORS;  Service: General;  Laterality: Left;  repair left inguinal hernia with mesh   . Craniotomy Left 02/06/2016    Procedure: CRANIOTOMY HEMATOMA EVACUATION SUBDURAL;  Surgeon: Kevan Ny Ditty, MD;  Location: [facility] ORS;  Service: Neurosurgery;  Laterality: Left;   Family History  Problem Relation Age of Onset  . Cancer     Social History:  reports that she quit smoking about 34 years ago. Her smoking use included Cigarettes. She has a 5 pack-year smoking history. She has never used smokeless tobacco. She reports that she does not drink alcohol or use illicit drugs. Allergies:  Allergies  Allergen Reactions  . Darvocet [Propoxyphene N-Acetaminophen] Nausea And Vomiting  . Metformin And Related Diarrhea  .  Percocet [Oxycodone-Acetaminophen] Nausea And Vomiting  . Vicodin [Hydrocodone-Acetaminophen] Itching   Medications Prior to Admission  Medication Sig Dispense Refill  . Fluticasone-Salmeterol (ADVAIR) 250-50 MCG/DOSE AEPB Inhale 1 puff into the lungs 2 (two) times daily as needed (for wheezing/shortness  of breath).     [name] glimepiride (AMARYL) 2 MG tablet Take 4 mg by mouth at bedtime.     [name] latanoprost (XALATAN) 0.005 % ophthalmic solution Place 1 drop into both eyes at bedtime.     [name] lisinopril (PRINIVIL,ZESTRIL) 20 MG tablet Take 20 mg by mouth at bedtime.     . pirbuterol (MAXAIR) 200 MCG/INH inhaler Inhale 2 puffs into the lungs 4 (four) times daily as needed for wheezing or shortness of breath.     . polyethylene glycol (MIRALAX / GLYCOLAX) packet Take 17 g by mouth daily as needed for mild constipation.     . theophylline (UNIPHYL) 400 MG 24 hr tablet Take 400 mg by mouth at bedtime.     [name] thyroid (ARMOUR) 60 MG tablet Take 60 mg by mouth at bedtime.     . verapamil (CALAN-SR) 180 MG CR tablet Take 180 mg by mouth at bedtime.      Home: Home Living Family/patient expects to be discharged to:: Skilled nursing facility Living Arrangements: Spouse/significant other, Children ([name], daughter cares for both her parents) Available Help at Discharge: Family, Available 24 hours/day Type of Home: House Home Access: Stairs to enter [employer] of Steps: 2 Entrance Stairs-Rails: Left, Right, Can reach both Home Layout: One level Bathroom Shower/Tub: Tub/shower unit, Multimedia programmer: Handicapped height Bathroom Accessibility: Yes Additional Comments: daughter lives w/ pt and pt's husband.  Pt's husband mainly sits or stays in bed and is not very active, requiring assist from daughter for ADLs and ambulation.  Lives With: Daughter, Spouse   Functional History: Prior Function Level of Independence: Needs assistance Gait / Transfers Assistance Needed: pt indicates since fall she only ambulates short distances in the house with a cane ADL's / Homemaking Assistance Needed: A with ADLs sicne fall and daughter performs all homemaking tasks.  Functional Status:  Mobility: Bed Mobility Overal bed mobility: Needs Assistance Bed Mobility: Supine to Sit Rolling:  Supervision Sidelying to sit: Min assist Supine to sit: Max assist, +2 for physical assistance, HOB elevated General bed mobility comments: assist to lift shoulders  Transfers Overall transfer level: Needs assistance Equipment used: Rolling walker (2 wheeled) Transfers: Sit to/from Stand, Stand Pivot Transfers Sit to Stand: Min guard Stand pivot transfers: Min assist General transfer comment: Sit to stand from recliner x 3 and BSC (over toilet) x 1 following instructions for hand placement 25% of the time. Ambulation/Gait Ambulation/Gait assistance: Min assist, +2 safety/equipment Ambulation Distance (Feet): 24 Feet (plus 9' with sitting break in between then to BR & back) Assistive device: Rolling walker (2 wheeled) Gait Pattern/deviations: Decreased step length - right, Decreased step length - left, Shuffle, Drifts right/left, Narrow base of support, Trunk flexed General Gait Details: 2nd person chair follow. Shuffeling gait pattern with head down and not able to look forward despite cueing.  Runs into objects on her R side.  Once she hits items, she is able to correct, but not able to anticipate obstacle.  Shuffeling gait pattern. Gait velocity: decreased    ADL: ADL Overall ADL's : Needs assistance/impaired Eating/Feeding: Set up, Sitting Grooming: Wash/dry hands, Wash/dry face, Oral care, Brushing hair, Set up, Supervision/safety, Sitting Upper Body Bathing: Moderate assistance, Sitting Lower Body Bathing: Maximal assistance,  Sit to/from stand Upper Body Dressing : Moderate assistance, Sitting Lower Body Dressing: Moderate assistance, Sit to/from stand Lower Body Dressing Details (indicate cue type and reason): able to don socks with min A  Toilet Transfer: Minimal assistance, Ambulation, Comfort height toilet, BSC, Grab bars, RW Toilet Transfer Details (indicate cue type and reason): Pt requries assist to maneuver RW in small spaces.  Toileting- Clothing Manipulation and  Hygiene: Total assistance, Sit to/from stand Functional mobility during ADLs: Minimal assistance, Rolling walker General ADL Comments: Pt requires encouragement   Cognition: Cognition Overall Cognitive Status: Impaired/Different from baseline Arousal/Alertness: Awake/alert Orientation Level: Oriented X4 Attention: Focused Focused Attention: Appears intact Memory: Impaired Memory Impairment: Retrieval deficit, Decreased short term memory Decreased Short Term Memory: Verbal basic Problem Solving: Impaired Problem Solving Impairment: Verbal basic Behaviors: Poor frustration tolerance Safety/Judgment: Impaired Cognition Arousal/Alertness: Awake/alert Behavior During Therapy: Flat affect Overall Cognitive Status: Impaired/Different from baseline Area of Impairment: Attention, Safety/judgement, Problem solving Orientation Level: Time Current Attention Level: Sustained Memory: Decreased short-term memory Following Commands: Follows one step commands consistently Safety/Judgement: Decreased awareness of safety, Decreased awareness of deficits Awareness: Emergent Problem Solving: Slow processing, Decreased initiation, Difficulty sequencing, Requires verbal cues General Comments: Pt quiet and reserved this date   Physical Exam: Blood pressure 123/74, pulse 67, temperature 98.6 F (37 C), temperature source Oral, resp. rate 18, height 5\' 6"  (1.676 m), weight 90.3 kg (199 lb 1.2 oz), last menstrual period 03/24/2010, SpO2 98 %. Physical Exam Constitutional: She appears well-developed and well-nourished.  HENT:  Head: Normocephalic.  Craniotomy site clean and dry  Eyes: Reactive light  Neck: Normal range of motion. Neck supple. No thyromegaly present.  Cardiovascular: Normal rate and regular rhythm.  Respiratory: Effort normal and breath sounds normal. No respiratory distress.  GI: Soft. Bowel sounds are normal. She exhibits no distension.  Musculoskeletal: She exhibits no  tenderness.  PROM WNL  Neurological:  Mood is flat.  Patient more alert at times per report Motor: appears to be >/ 3/5 throught, however pt with limited participation.  Skin: Skin is warm and dry.  Psychiatric: She is a bit restless. Cognition and memory are impaired. She expresses inappropriate judgment  Medical Problem List and Plan: 1.  Persistent headaches seizure with altered mental status secondary to traumatic subdural hematoma 2.  DVT Prophylaxis/Anticoagulation: SCDs. Monitor for any signs of DVT 3. Pain Management: Tylenol as needed 4. Seizure disorder. Continue Keppra 1000 mg twice a day. 5. Neuropsych: This patient is not capable of making decisions on her own behalf. 6. Skin/Wound Care: Routine skin checks 7. Fluids/Electrolytes/Nutrition: Routine I&O with follow-up chemistries 8. COPD. Continue nebulizer treatment and inhalers as directed. Check oxygen saturations every shift 9. Hypertension. Lisinopril 20 mg daily, verapamil 180 mg daily at bedtime. Monitor with increased mobility 10. Diabetes mellitus with peripheral neuropathy. Hemoglobin A1c 6.7. Amaryl 4 mg daily. Check blood sugars before meals and at bedtime 11. Hypothyroidism. Thyroid 60 mg daily at bedtime. TSH 0.285.  Post Admission Physician Evaluation: 1. Functional deficits secondary  to traumatic subdural hematoma. 2. Patient is admitted to receive collaborative, interdisciplinary care between the physiatrist, rehab nursing staff, and therapy team. 3. Patient's level of medical complexity and substantial therapy needs in context of that medical necessity cannot be provided at a lesser intensity of care such as a SNF. 4. Patient has experienced substantial functional loss from his/her baseline which was documented above under the "Functional History" and "Functional Status" headings.  Judging by the patient's diagnosis, physical exam, and functional  history, the patient has potential for functional progress which  will result in measurable gains while on inpatient rehab.  These gains will be of substantial and practical use upon discharge  in facilitating mobility and self-care at the household level. 5. Physiatrist will provide 24 hour management of medical needs as well as oversight of the therapy plan/treatment and provide guidance as appropriate regarding the interaction of the two. 6. 24 hour rehab nursing will assist with bladder management, safety, skin/wound care, disease management, medication administration and patient education and help integrate therapy concepts, techniques,education, etc. 7. PT will assess and treat for/with: Lower extremity strength, range of motion, stamina, balance, functional mobility, safety, adaptive techniques and equipment, woundcare, coping skills, pain control, education.   Goals are: Supervison. 8. OT will assess and treat for/with: ADL's, functional mobility, safety, upper extremity strength, adaptive techniques and equipment, wound mgt, ego support, and community reintegration.   Goals are: Supervision. Therapy may not proceed with showering this patient. 9. SLP will assess and treat for/with: cognition.  Goals are: MinA/Supervision. 10. Case Management and Social Worker will assess and treat for psychological issues and discharge planning. 11. Team conference will be held weekly to assess progress toward goals and to determine barriers to discharge. 12. Patient will receive at least 3 hours of therapy per day at least 5 days per week. 13. ELOS: 10-14 days.       14. Prognosis:  excellent   Delice Lesch, MD 02/12/2016

## 2016-02-12 NOTE — Care Management Note (Signed)
Case Management Note  Patient Details  Name: Carolyn [name] MRN: [id number] Date of Birth: 03/05/1933  Subjective/Objective:                    Action/Plan: Patient discharging to CIR today. No further needs per CM.  Expected Discharge Date:                  Expected Discharge Plan:     In-House Referral:     Discharge planning Services     Post Acute Care Choice:    Choice offered to:     DME Arranged:    DME Agency:     HH Arranged:    HH Agency:     Status of Service:  In process, will continue to follow  Medicare Important Message Given:  Yes Date Medicare IM Given:    Medicare IM give by:    Date Additional Medicare IM Given:    Additional Medicare Important Message give by:     If discussed at [place] of Stay Meetings, dates discussed:    Additional Comments:  Pollie Friar, RN 02/12/2016, 3:15 PM

## 2016-02-12 NOTE — Progress Notes (Signed)
Report given to Angie on 4W. Cori Razor, RN

## 2016-02-12 NOTE — Progress Notes (Signed)
Patient received at 1700 alert and oriented x3. Patient noted with word finding difficulties. Left scalp incision with telfa intact by staples old dried blood noted. Oriented patient to room and call bell system. Bed alarm activated. Awaiting daughter to arrive. Assisted patient in calling daughter.  Mliss Sax

## 2016-02-12 NOTE — H&P (Signed)
Physical Medicine and Rehabilitation Admission H&P    Chief Complaint  Patient presents with  . Headache  . Emesis  : HPI: Carolyn [name] is a 82 y.o. right handed female with history of hypertension, diabetes mellitus, COPD. By chart review patient lives with husband and daughter in [place]. She used a walker prior to admission. One level home with 2 steps to entry. Daughter is an Therapist, sports who is a full-time caregiver for both the patient and the patient's spouse. Patient with with history of subdural hematoma after multiple falls beginning in March 2017 as well as left radial head fracture. Family notes patient with persistent headaches. Patient with recent simple partial seizure and presented to the emergency department 02/06/2016. Cranial CT scan revealed enlarging chronic subdural hematoma. Underwent left craniotomy for evacuation of subdural hematoma 02/06/2016 per Dr. Cyndy Freeze. Maintained on Keppra for suspect seizure. Tolerating a regular consistency diet. Repeat head CT on 3/26 suggesting increased pain midline shift. PhysicalAnd occupational therapy evaluations completed and ongoing. M.D. has requested physical medicine rehabilitation consult.Patient was admitted for comprehensive rehabilitation program  ROS Constitutional: Negative for fever and chills.  Eyes: Negative for blurred vision and double vision.  Respiratory: Negative for cough and shortness of breath.  Cardiovascular: Negative for chest pain, palpitations and leg swelling.  Gastrointestinal: Positive for constipation. Negative for nausea and vomiting.  Genitourinary: Negative for dysuria and hematuria.  Musculoskeletal: Positive for myalgias, back pain, joint pain and falls.  Skin: Negative for rash.  Neurological: Positive for weakness and headaches. Negative for loss of consciousness.  All other systems reviewed and are negative   Past Medical History  Diagnosis Date  . Diabetes mellitus   .  Hypertension   . Hypercholesterolemia   . Gallstones   . Glaucoma   . Cataracts, bilateral   . COPD (chronic obstructive pulmonary disease) ([place])   . Hypothyroid   . PONV (postoperative nausea and vomiting)   . Asthma   . Chronic kidney disease 2007    kidney cancer and partial nephrctomy-right  . Subdural bleeding [facility])    Past Surgical History  Procedure Laterality Date  . Dilation and curettage of uterus    . Partial nephrectomy      cancer  . Breast biopsy    . D&c for enlarged uterine  210  . Total abdominal hysterectomy w/ bilateral salpingoophorectomy  2011  . Cateract extractions and iol    . Cholecystectomy  2008  . Abdominal hysterectomy    . Eye surgery  08/2011,09/2012    bilateral cataracts  . Hernia repair  01/19/12    inguinal  . Inguinal hernia repair  01/19/2012    Procedure: HERNIA REPAIR INGUINAL ADULT;  Surgeon: Earnstine Regal, MD;  Location: [facility] ORS;  Service: General;  Laterality: Left;  repair left inguinal hernia with mesh   . Craniotomy Left 02/06/2016    Procedure: CRANIOTOMY HEMATOMA EVACUATION SUBDURAL;  Surgeon: Kevan Ny Ditty, MD;  Location: [facility];  Service: Neurosurgery;  Laterality: Left;   Family History  Problem Relation Age of Onset  . Cancer     Social History:  reports that she quit smoking about 34 years ago. Her smoking use included Cigarettes. She has a 5 pack-year smoking history. She has never used smokeless tobacco. She reports that she does not drink alcohol or use illicit drugs. Allergies:  Allergies  Allergen Reactions  . Darvocet [Propoxyphene N-Acetaminophen] Nausea And Vomiting  . Metformin And Related Diarrhea  .  Percocet [Oxycodone-Acetaminophen] Nausea And Vomiting  . Vicodin [Hydrocodone-Acetaminophen] Itching   Medications Prior to Admission  Medication Sig Dispense Refill  . Fluticasone-Salmeterol (ADVAIR) 250-50 MCG/DOSE AEPB Inhale 1 puff into the lungs 2 (two) times daily as needed (for wheezing/shortness  of breath).     [name] glimepiride (AMARYL) 2 MG tablet Take 4 mg by mouth at bedtime.     [name] latanoprost (XALATAN) 0.005 % ophthalmic solution Place 1 drop into both eyes at bedtime.     [name] lisinopril (PRINIVIL,ZESTRIL) 20 MG tablet Take 20 mg by mouth at bedtime.     . pirbuterol (MAXAIR) 200 MCG/INH inhaler Inhale 2 puffs into the lungs 4 (four) times daily as needed for wheezing or shortness of breath.     . polyethylene glycol (MIRALAX / GLYCOLAX) packet Take 17 g by mouth daily as needed for mild constipation.     . theophylline (UNIPHYL) 400 MG 24 hr tablet Take 400 mg by mouth at bedtime.     [name] thyroid (ARMOUR) 60 MG tablet Take 60 mg by mouth at bedtime.     . verapamil (CALAN-SR) 180 MG CR tablet Take 180 mg by mouth at bedtime.      Home: Home Living Family/patient expects to be discharged to:: Skilled nursing facility Living Arrangements: Spouse/significant other, Children ([name], daughter cares for both her parents) Available Help at Discharge: Family, Available 24 hours/day Type of Home: House Home Access: Stairs to enter [employer] of Steps: 2 Entrance Stairs-Rails: Left, Right, Can reach both Home Layout: One level Bathroom Shower/Tub: Tub/shower unit, Multimedia programmer: Handicapped height Bathroom Accessibility: Yes Additional Comments: daughter lives w/ pt and pt's husband.  Pt's husband mainly sits or stays in bed and is not very active, requiring assist from daughter for ADLs and ambulation.  Lives With: Daughter, Spouse   Functional History: Prior Function Level of Independence: Needs assistance Gait / Transfers Assistance Needed: pt indicates since fall she only ambulates short distances in the house with a cane ADL's / Homemaking Assistance Needed: A with ADLs sicne fall and daughter performs all homemaking tasks.  Functional Status:  Mobility: Bed Mobility Overal bed mobility: Needs Assistance Bed Mobility: Supine to Sit Rolling:  Supervision Sidelying to sit: Min assist Supine to sit: Max assist, +2 for physical assistance, HOB elevated General bed mobility comments: assist to lift shoulders  Transfers Overall transfer level: Needs assistance Equipment used: Rolling walker (2 wheeled) Transfers: Sit to/from Stand, Stand Pivot Transfers Sit to Stand: Min guard Stand pivot transfers: Min assist General transfer comment: Sit to stand from recliner x 3 and BSC (over toilet) x 1 following instructions for hand placement 25% of the time. Ambulation/Gait Ambulation/Gait assistance: Min assist, +2 safety/equipment Ambulation Distance (Feet): 24 Feet (plus 53' with sitting break in between then to BR & back) Assistive device: Rolling walker (2 wheeled) Gait Pattern/deviations: Decreased step length - right, Decreased step length - left, Shuffle, Drifts right/left, Narrow base of support, Trunk flexed General Gait Details: 2nd person chair follow. Shuffeling gait pattern with head down and not able to look forward despite cueing.  Runs into objects on her R side.  Once she hits items, she is able to correct, but not able to anticipate obstacle.  Shuffeling gait pattern. Gait velocity: decreased    ADL: ADL Overall ADL's : Needs assistance/impaired Eating/Feeding: Set up, Sitting Grooming: Wash/dry hands, Wash/dry face, Oral care, Brushing hair, Set up, Supervision/safety, Sitting Upper Body Bathing: Moderate assistance, Sitting Lower Body Bathing: Maximal assistance,  Sit to/from stand Upper Body Dressing : Moderate assistance, Sitting Lower Body Dressing: Moderate assistance, Sit to/from stand Lower Body Dressing Details (indicate cue type and reason): able to don socks with min A  Toilet Transfer: Minimal assistance, Ambulation, Comfort height toilet, BSC, Grab bars, RW Toilet Transfer Details (indicate cue type and reason): Pt requries assist to maneuver RW in small spaces.  Toileting- Clothing Manipulation and  Hygiene: Total assistance, Sit to/from stand Functional mobility during ADLs: Minimal assistance, Rolling walker General ADL Comments: Pt requires encouragement   Cognition: Cognition Overall Cognitive Status: Impaired/Different from baseline Arousal/Alertness: Awake/alert Orientation Level: Oriented X4 Attention: Focused Focused Attention: Appears intact Memory: Impaired Memory Impairment: Retrieval deficit, Decreased short term memory Decreased Short Term Memory: Verbal basic Problem Solving: Impaired Problem Solving Impairment: Verbal basic Behaviors: Poor frustration tolerance Safety/Judgment: Impaired Cognition Arousal/Alertness: Awake/alert Behavior During Therapy: Flat affect Overall Cognitive Status: Impaired/Different from baseline Area of Impairment: Attention, Safety/judgement, Problem solving Orientation Level: Time Current Attention Level: Sustained Memory: Decreased short-term memory Following Commands: Follows one step commands consistently Safety/Judgement: Decreased awareness of safety, Decreased awareness of deficits Awareness: Emergent Problem Solving: Slow processing, Decreased initiation, Difficulty sequencing, Requires verbal cues General Comments: Pt quiet and reserved this date   Physical Exam: Blood pressure 123/74, pulse 67, temperature 98.6 F (37 C), temperature source Oral, resp. rate 18, height 5\' 6"  (1.676 m), weight 90.3 kg (199 lb 1.2 oz), last menstrual period 03/24/2010, SpO2 98 %. Physical Exam Constitutional: She appears well-developed and well-nourished.  HENT:  Head: Normocephalic.  Craniotomy site clean and dry  Eyes: Reactive light  Neck: Normal range of motion. Neck supple. No thyromegaly present.  Cardiovascular: Normal rate and regular rhythm.  Respiratory: Effort normal and breath sounds normal. No respiratory distress.  GI: Soft. Bowel sounds are normal. She exhibits no distension.  Musculoskeletal: She exhibits no  tenderness.  PROM WNL  Neurological:  Mood is flat.  Patient more alert at times per report Motor: appears to be >/ 3/5 throught, however pt with limited participation.  Skin: Skin is warm and dry.  Psychiatric: She is a bit restless. Cognition and memory are impaired. She expresses inappropriate judgment  Medical Problem List and Plan: 1.  Persistent headaches seizure with altered mental status secondary to traumatic subdural hematoma 2.  DVT Prophylaxis/Anticoagulation: SCDs. Monitor for any signs of DVT 3. Pain Management: Tylenol as needed 4. Seizure disorder. Continue Keppra 1000 mg twice a day. 5. Neuropsych: This patient is not capable of making decisions on her own behalf. 6. Skin/Wound Care: Routine skin checks 7. Fluids/Electrolytes/Nutrition: Routine I&O with follow-up chemistries 8. COPD. Continue nebulizer treatment and inhalers as directed. Check oxygen saturations every shift 9. Hypertension. Lisinopril 20 mg daily, verapamil 180 mg daily at bedtime. Monitor with increased mobility 10. Diabetes mellitus with peripheral neuropathy. Hemoglobin A1c 6.7. Amaryl 4 mg daily. Check blood sugars before meals and at bedtime 11. Hypothyroidism. Thyroid 60 mg daily at bedtime. TSH 0.285.  Post Admission Physician Evaluation: 1. Functional deficits secondary  to traumatic subdural hematoma. 2. Patient is admitted to receive collaborative, interdisciplinary care between the physiatrist, rehab nursing staff, and therapy team. 3. Patient's level of medical complexity and substantial therapy needs in context of that medical necessity cannot be provided at a lesser intensity of care such as a SNF. 4. Patient has experienced substantial functional loss from his/her baseline which was documented above under the "Functional History" and "Functional Status" headings.  Judging by the patient's diagnosis, physical exam, and functional  history, the patient has potential for functional progress which  will result in measurable gains while on inpatient rehab.  These gains will be of substantial and practical use upon discharge  in facilitating mobility and self-care at the household level. 5. Physiatrist will provide 24 hour management of medical needs as well as oversight of the therapy plan/treatment and provide guidance as appropriate regarding the interaction of the two. 6. 24 hour rehab nursing will assist with bladder management, safety, skin/wound care, disease management, medication administration and patient education and help integrate therapy concepts, techniques,education, etc. 7. PT will assess and treat for/with: Lower extremity strength, range of motion, stamina, balance, functional mobility, safety, adaptive techniques and equipment, woundcare, coping skills, pain control, education.   Goals are: Supervison. 8. OT will assess and treat for/with: ADL's, functional mobility, safety, upper extremity strength, adaptive techniques and equipment, wound mgt, ego support, and community reintegration.   Goals are: Supervision. Therapy may not proceed with showering this patient. 9. SLP will assess and treat for/with: cognition.  Goals are: MinA/Supervision. 10. Case Management and Social Worker will assess and treat for psychological issues and discharge planning. 11. Team conference will be held weekly to assess progress toward goals and to determine barriers to discharge. 12. Patient will receive at least 3 hours of therapy per day at least 5 days per week. 13. ELOS: 10-14 days.       14. Prognosis:  excellent   Delice Lesch, MD 02/12/2016

## 2016-02-12 NOTE — Clinical Social Work Note (Signed)
Patient currently being considered by [facility], insurance authorization started on 3/28. CSW will continue to follow as needed.   CSW remains available as needed.   Glendon Axe, MSW, LCSWA [phone] 02/12/2016 11:51 AM

## 2016-02-12 NOTE — Progress Notes (Signed)
PMR Admission Coordinator Pre-Admission Assessment  Patient: Carolyn [name] is an 82 y.o., female MRN: [id number] DOB: 21-Apr-1933 Height: 5\' 6"  (167.6 cm) Weight: 90.3 kg (199 lb 1.2 oz)  Insurance Information HMO: PPO: yes PCP: IPA: 80/20: OTHER: medicare advantage plan PRIMARY: [employer] Policy#: [id number] Subscriber: pt CM Name: Erik Obey Phone#: [phone] Fax#: [phone] Pre-Cert#: [id number] Employer: retired approved for 7 days 3/29 thru 02/18/16 when update is due Benefits: Phone #: [phone] Name: 02/11/16 Eff. Date: 11/17/15 Deduct: none Out of Pocket Max: $3400 Life Max: none CIR: $225 per day days 1-6; then covers 100% SNF: no copay for days 1-20; $150 copay per day days 21-100 Outpatient: $15 copay per visit Co-Pay: no visit limit Home Health: $25 copay per visit Co-Pay: no visit limit DME: 80% Co-Pay: 20% Providers: in network  SECONDARY: none   Medicaid Application Date: Case Manager:  Disability Application Date: Case Worker:   Emergency Contact Information Contact Information    Name Relation Home Work Mobile   Reynolds,[name] [phone]  [phone]   Samanth, Sundell Daughter [phone]     Quiniya, Buske Spouse [phone]       Current Medical History  Patient Admitting Diagnosis: Traumatic Subdural Hematoma  History of Present Illness: Carolyn [name] is an 82 y.o. right handed female with history of hypertension, diabetes mellitus, COPD. By chart review patient lives with husband and daughter in [place]. Daughter is an Therapist, sports who is a full-time caregiver for both the patient and the patient's spouse. Patient with with history of subdural hematoma after  multiple falls beginning in March 2017 as well as left radial head fracture. Family notes patient with persistent headaches. Patient with recent simple partial seizure and presented to the emergency department 02/06/2016. Cranial CT scan revealed enlarging chronic subdural hematoma. Underwent left craniotomy for evacuation of subdural hematoma 02/06/2016 per Dr. Cyndy Freeze. Maintained on Keppra for suspect seizure. Tolerating a regular consistency diet. Repeat head CT on 3/26 suggesting increased pain midline shift.     Past Medical History  Past Medical History  Diagnosis Date  . Diabetes mellitus   . Hypertension   . Hypercholesterolemia   . Gallstones   . Glaucoma   . Cataracts, bilateral   . COPD (chronic obstructive pulmonary disease) ([place])   . Hypothyroid   . PONV (postoperative nausea and vomiting)   . Asthma   . Chronic kidney disease 2007    kidney cancer and partial nephrctomy-right  . Subdural bleeding [facility])     Family History  family history is not on file.  Prior Rehab/Hospitalizations:  Has the patient had major surgery during 100 days prior to admission? No  Current Medications   Current facility-administered medications:  . 0.9 % sodium chloride infusion, , Intravenous, Continuous, Kevan Ny Ditty, MD, Last Rate: 100 mL/hr at 02/09/16 2204 . acetaminophen (TYLENOL) tablet 650 mg, 650 mg, Oral, Q4H PRN, 325 mg at 02/10/16 2234 **OR** acetaminophen (TYLENOL) suppository 650 mg, 650 mg, Rectal, Q4H PRN, Kevan Ny Ditty, MD . albuterol (PROVENTIL) (2.5 MG/3ML) 0.083% nebulizer solution 2.5 mg, 2.5 mg, Inhalation, Q4H PRN, Kevan Ny Ditty, MD . bisacodyl (DULCOLAX) EC tablet 5 mg, 5 mg, Oral, Daily PRN, Kevan Ny Ditty, MD . docusate sodium (COLACE) capsule 100 mg, 100 mg, Oral, BID, Kevan Ny Ditty, MD, 100 mg at 02/12/16 0917 . glimepiride (AMARYL) tablet 4 mg, 4 mg, Oral, Q supper, Kevan Ny  Ditty, MD, 4 mg at 02/11/16 1722 . hydrALAZINE (APRESOLINE) injection 5-10 mg, 5-10 mg, Intravenous,  Q1H PRN, Kevan Ny Ditty, MD . HYDROmorphone (DILAUDID) injection 0.5-1 mg, 0.5-1 mg, Intravenous, Q2H PRN, Kevan Ny Ditty, MD, 1 mg at 02/07/16 1122 . labetalol (NORMODYNE,TRANDATE) injection 10-40 mg, 10-40 mg, Intravenous, Q10 min PRN, Kevan Ny Ditty, MD . latanoprost (XALATAN) 0.005 % ophthalmic solution 1 drop, 1 drop, Both Eyes, QHS, Tamala Fothergill, MD, 1 drop at 02/11/16 2104 . levETIRAcetam (KEPPRA) tablet 1,000 mg, 1,000 mg, Oral, BID, Kevan Ny Ditty, MD, 1,000 mg at 02/12/16 0917 . lisinopril (PRINIVIL,ZESTRIL) tablet 20 mg, 20 mg, Oral, QHS, Kevan Ny Ditty, MD, 20 mg at 02/11/16 2105 . naloxone [name]) injection 0.08 mg, 0.08 mg, Intravenous, PRN, Kevan Ny Ditty, MD . ondansetron [facility]) tablet 4 mg, 4 mg, Oral, Q4H PRN **OR** ondansetron (ZOFRAN) injection 4 mg, 4 mg, Intravenous, Q4H PRN, Kevan Ny Ditty, MD . pantoprazole (PROTONIX) EC tablet 40 mg, 40 mg, Oral, QHS, Kevan Ny Ditty, MD, 40 mg at 02/10/16 2221 . polyethylene glycol (MIRALAX / GLYCOLAX) packet 17 g, 17 g, Oral, Daily PRN, Kevan Ny Ditty, MD . senna [facility]) tablet 8.6 mg, 1 tablet, Oral, BID, Kevan Ny Ditty, MD, 8.6 mg at 02/12/16 0917 . sodium phosphate (FLEET) 7-19 GM/118ML enema 1 enema, 1 enema, Rectal, Once PRN, Kevan Ny Ditty, MD . theophylline (UNIPHYL) 400 MG 24 hr tablet 400 mg, 400 mg, Oral, QHS, Kevan Ny Ditty, MD, 400 mg at 02/11/16 2105 . thyroid (ARMOUR) tablet 60 mg, 60 mg, Oral, QHS, Kevan Ny Ditty, MD, 60 mg at 02/11/16 2106 . verapamil (CALAN-SR) CR tablet 180 mg, 180 mg, Oral, QHS, Kevan Ny Ditty, MD, 180 mg at 02/11/16 2106  Patients Current Diet: Diet Carb Modified Fluid consistency:: Thin; Room service appropriate?: Yes  Precautions / Restrictions Precautions Precautions: Fall Precaution  Comments: Daughter reports Dr Gladstone Lighter removed previous long arm cast and gave pt a splint to wear mostly for night time use  Restrictions Weight Bearing Restrictions: No   Has the patient had 2 or more falls or a fall with injury in the past year?Yes  Prior Activity Level Limited Community (1-2x/wk): Pt independent and driving prior to initial injury Loved working in her flowers prior to initial fall. Walked her dog. Napped After lunch   Home Assistive Devices / [place] Devices/Equipment: Gilford Rile (specify type)  Prior Device Use: Indicate devices/aids used by the patient prior to current illness, exacerbation or injury? Walker and cane pta.   Prior Functional Level Prior Function Level of Independence: Independent Gait / Transfers Assistance Needed: pt indicates since fall she only ambulates short distances in the house with a cane ADL's / Homemaking Assistance Needed: A with ADLs sicne fall and daughter performs all homemaking tasks. 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 Independent  Indoor Mobility: Did the patient need assistance with walking from room to room (with or without device)? Independent  Stairs: Did the patient need assistance with internal or external stairs (with or without device)? Independent  Functional Cognition: Did the patient need help planning regular tasks such as shopping or remembering to take medications? Independent  Current Functional Level Cognition  Arousal/Alertness: Awake/alert Overall Cognitive Status: Impaired/Different from baseline Current Attention Level: Sustained Orientation Level: Oriented X4 Following Commands: Follows one step commands consistently Safety/Judgement: Decreased awareness of safety, Decreased awareness of deficits General Comments: Pt quiet and reserved this  date  Attention: Focused  Focused Attention: Appears intact Memory: Impaired Memory Impairment: Retrieval deficit, Decreased short term memory Decreased Short Term Memory: Verbal basic Problem Solving: Impaired Problem Solving Impairment: Verbal basic Behaviors: Poor frustration tolerance Safety/Judgment: Impaired   Extremity Assessment (includes Sensation/Coordination)  Upper Extremity Assessment: LUE deficits/detail LUE Deficits / Details: Pt tends to guard Lt UE. She would only move shoulder with max prompting. Gentle AAROM performed Lt shoulder to ~100-110 flexion and abduction   Lower Extremity Assessment: Defer to PT evaluation    ADLs  Overall ADL's : Needs assistance/impaired Eating/Feeding: Set up, Sitting Grooming: Wash/dry hands, Wash/dry face, Oral care, Brushing hair, Set up, Supervision/safety, Sitting Upper Body Bathing: Moderate assistance, Sitting Lower Body Bathing: Maximal assistance, Sit to/from stand Upper Body Dressing : Moderate assistance, Sitting Lower Body Dressing: Moderate assistance, Sit to/from stand Lower Body Dressing Details (indicate cue type and reason): able to don socks with min A  Toilet Transfer: Minimal assistance, Ambulation, Comfort height toilet, BSC, Grab bars, RW Toilet Transfer Details (indicate cue type and reason): Pt requries assist to maneuver RW in small spaces.  Toileting- Clothing Manipulation and Hygiene: Total assistance, Sit to/from stand Functional mobility during ADLs: Minimal assistance, Rolling walker General ADL Comments: Pt requires encouragement     Mobility  Overal bed mobility: Needs Assistance Bed Mobility: Supine to Sit Rolling: Supervision Sidelying to sit: Min assist Supine to sit: Max assist, +2 for physical assistance, HOB elevated General bed mobility comments: assist to lift shoulders     Transfers  Overall transfer level: Needs assistance Equipment used: Rolling walker (2  wheeled) Transfers: Sit to/from Stand, Stand Pivot Transfers Sit to Stand: Min guard Stand pivot transfers: Min assist General transfer comment: Sit to stand from recliner x 3 and BSC (over toilet) x 1 following instructions for hand placement 25% of the time.    Ambulation / Gait / Stairs / Wheelchair Mobility  Ambulation/Gait Ambulation/Gait assistance: Min assist, +2 safety/equipment Ambulation Distance (Feet): 24 Feet (plus 36' with sitting break in between then to BR & back) Assistive device: Rolling walker (2 wheeled) Gait Pattern/deviations: Decreased step length - right, Decreased step length - left, Shuffle, Drifts right/left, Narrow base of support, Trunk flexed General Gait Details: 2nd person chair follow. Shuffeling gait pattern with head down and not able to look forward despite cueing. Runs into objects on her R side. Once she hits items, she is able to correct, but not able to anticipate obstacle. Shuffeling gait pattern. Gait velocity: decreased Number of Stairs: 2    Posture / Balance Dynamic Sitting Balance Sitting balance - Comments: able to sit EOB without UE support Balance Overall balance assessment: Needs assistance Sitting-balance support: Feet supported Sitting balance-Leahy Scale: Fair Sitting balance - Comments: able to sit EOB without UE support Standing balance support: Bilateral upper extremity supported Standing balance-Leahy Scale: Poor Standing balance comment: requires RW    Special needs/care consideration Skin surgical incision Bowel mgmt: continent Bladder mgmt: continent Diabetic mgmt yes   Previous Home Environment Living Arrangements: Spouse/significant other, Children Lives With: Daughter, Spouse Available Help at Discharge: Family, Available 24 hours/day Type of Home: House Home Layout: One level Home Access: Stairs to enter Entrance Stairs-Rails: Left, Right, Can reach both Entrance Stairs-Number of Steps: 2 Bathroom  Shower/Tub: Tub/shower unit, Multimedia programmer: Handicapped height Bathroom Accessibility: Yes How Accessible: Accessible via walker Home Care Services: No Additional Comments: daughter lives w/ pt and pt's husband. Pt's husband mainly sits or stays in bed and is not very active, requiring  assist from daughter for ADLs and ambulation.  Discharge Living Setting Plans for Discharge Living Setting: Patient's home, Lives with (comment) Type of Home at Discharge: House Discharge Home Layout: One level Discharge Home Access: Stairs to enter Entrance Stairs-Rails: Right, Left, Can reach both Entrance Stairs-Number of Steps: 2 Discharge Bathroom Shower/Tub: Tub/shower unit, Walk-in shower Discharge Bathroom Toilet: Handicapped height Discharge Bathroom Accessibility: Yes How Accessible: Accessible via walker Does the patient have any problems obtaining your medications?: No  Social/Family/Support Systems Patient Roles: Spouse, Parent Contact Information: [name], daughter Anticipated Caregiver: daughter Anticipated Ambulance person Information: see above Ability/Limitations of Caregiver: daughter RN of 55 yrs, states she has a bad back  Caregiver Availability: 24/7 Discharge Plan Discussed with Primary Caregiver: Yes Is Caregiver In Agreement with Plan?: Yes Does Caregiver/Family have Issues with Lodging/Transportation while Pt is in Rehab?: No Daughter is retired Engineer, drilling since January. She is caregiver for her parents. Pt's spouse has fallen 9 times since January. Pt tripped on her dog.  Goals/Additional Needs Patient/Family Goal for Rehab: supervision to min assist with PT, OT and SLP Expected length of stay: ELOS 20-24 days Pt/Family Agrees to Admission and willing to participate: Yes Program Orientation Provided & Reviewed with Pt/Caregiver Including Roles & Responsibilities: Yes  Decrease burden of Care through IP rehab admission: n/a  Possible need for SNF  placement upon discharge: daughter refuses any discussion of SNF.  Patient Condition: This patient's medical and functional status has changed since the consult dated: min to mod assist in which the Rehabilitation Physician determined and documented that the patient's condition is appropriate for intensive rehabilitative care in an inpatient rehabilitation facility. See "History of Present Illness" (above) for medical update. Functional changes are: min to mod assist. Patient's medical and functional status update has been discussed with the Rehabilitation physician and patient remains appropriate for inpatient rehabilitation. Will admit to inpatient rehab today.  Preadmission Screen Completed By: Cleatrice Burke, 02/12/2016 12:11 PM ______________________________________________________________________  Discussed status with Dr. Posey Pronto on 02/12/2016 at 1211 and received telephone approval for admission today.  Admission Coordinator: Cleatrice Burke, time [id number] Date 02/12/2016.          Cosigned by: Ankit [name], MD at 02/12/2016 12:14 PM  Revision History     Date/Time User Provider Type Action   02/12/2016 12:14 PM Ankit [name], MD Physician Cosign   02/12/2016 12:13 PM Cristina Gong, RN Rehab Admission Coordinator Sign   02/12/2016 12:12 PM Cristina Gong, RN Rehab Admission Coordinator Sign   View Details Report

## 2016-02-12 NOTE — Discharge Summary (Signed)
Date of Admission: 02/05/16  Date of Discharge: 02/12/16  Admission Diagnosis: Left sided subdural hematoma  Discharge Diagnosis: Same  Procedure Performed: Left craniotomy for evacuation of subdural hematoma  Attending: [facility] Course:  The patient was admitted to the hospital from the emergency department when she presented due to having a simple partial seizure. A CT scan showed that she had developed a subacute subdural hematoma with mass effect. She was neurologically intact at that time. She was started on Keppra. The following day she was taken to the operating room for the above listed operation. She tolerated this well. She was observed for one night in the ICU and then transferred to the floor. She has had an uneventful postoperative course. She has increased her ability to work with therapy and is accepted to inpatient rehabilitation. She is discharged at this time in stable medical neurological condition.  Discharged Medications: Resume prior medications, Keppra 1000 mg by mouth twice a day  Follow up: With me in 2 weeks

## 2016-02-12 NOTE — Progress Notes (Signed)
Physical Medicine and Rehabilitation Consult Reason for Consult: Traumatic subdural hematoma Referring Physician: Dr. Cyndy Freeze   HPI: Carolyn [name] is a 82 y.o. right handed female with history of hypertension, diabetes mellitus, COPD. By chart review patient lives with husband and daughter in [place]. She used a walker prior to admission. One level home with 2 steps to entry. Daughter is an Therapist, sports who is a full-time caregiver for both the patient and the patient's spouse. Patient with with history of subdural hematoma after multiple falls beginning in March 2017 as well as left radial head fracture. Family notes patient with persistent headaches. Patient with recent simple partial seizure and presented to the emergency department 02/06/2016. Cranial CT scan revealed enlarging chronic subdural hematoma. Underwent left craniotomy for evacuation of subdural hematoma 02/06/2016 per Dr. Cyndy Freeze. Maintained on Keppra for suspect seizure. Tolerating a regular consistency diet. Repeat head CT on 3/26 suggesting increased pain midline shift. Physical occupational therapy evaluations completed and ongoing. M.D. has requested physical medicine rehabilitation consult.  Review of Systems  Constitutional: Negative for fever and chills.  Eyes: Negative for blurred vision and double vision.  Respiratory: Negative for cough and shortness of breath.  Cardiovascular: Negative for chest pain, palpitations and leg swelling.  Gastrointestinal: Positive for constipation. Negative for nausea and vomiting.  Genitourinary: Negative for dysuria and hematuria.  Musculoskeletal: Positive for myalgias, back pain, joint pain and falls.  Skin: Negative for rash.  Neurological: Positive for weakness and headaches. Negative for loss of consciousness.  All other systems reviewed and are negative.  Past Medical History  Diagnosis Date  . Diabetes mellitus   . Hypertension   . Hypercholesterolemia   .  Gallstones   . Glaucoma   . Cataracts, bilateral   . COPD (chronic obstructive pulmonary disease) ([place])   . Hypothyroid   . PONV (postoperative nausea and vomiting)   . Asthma   . Chronic kidney disease 2007    kidney cancer and partial nephrctomy-right  . Subdural bleeding [facility])    Past Surgical History  Procedure Laterality Date  . Dilation and curettage of uterus    . Partial nephrectomy      cancer  . Breast biopsy    . D&c for enlarged uterine  210  . Total abdominal hysterectomy w/ bilateral salpingoophorectomy  2011  . Cateract extractions and iol    . Cholecystectomy  2008  . Abdominal hysterectomy    . Eye surgery  08/2011,09/2012    bilateral cataracts  . Hernia repair  01/19/12    inguinal  . Inguinal hernia repair  01/19/2012    Procedure: HERNIA REPAIR INGUINAL ADULT; Surgeon: Earnstine Regal, MD; Location: [facility] ORS; Service: General; Laterality: Left; repair left inguinal hernia with mesh   . Craniotomy Left 02/06/2016    Procedure: CRANIOTOMY HEMATOMA EVACUATION SUBDURAL; Surgeon: Kevan Ny Ditty, MD; Location: [facility]UR[facility] ORS; Service: Neurosurgery; Laterality: Left;   Family History  Problem Relation Age of Onset  . Cancer     Social History:  reports that she quit smoking about 34 years ago. Her smoking use included Cigarettes. She has a 5 pack-year smoking history. She has never used smokeless tobacco. She reports that she does not drink alcohol or use illicit drugs. Allergies:  Allergies  Allergen Reactions  . Darvocet [Propoxyphene N-Acetaminophen] Nausea And Vomiting  . Metformin And Related Diarrhea  . Percocet [Oxycodone-Acetaminophen] Nausea And Vomiting  . Vicodin [Hydrocodone-Acetaminophen] Itching   Medications Prior to Admission  Medication Sig Dispense Refill  .  Fluticasone-Salmeterol (ADVAIR) 250-50 MCG/DOSE AEPB  Inhale 1 puff into the lungs 2 (two) times daily as needed (for wheezing/shortness of breath).     [name] glimepiride (AMARYL) 2 MG tablet Take 4 mg by mouth at bedtime.     [name] latanoprost (XALATAN) 0.005 % ophthalmic solution Place 1 drop into both eyes at bedtime.     [name] lisinopril (PRINIVIL,ZESTRIL) 20 MG tablet Take 20 mg by mouth at bedtime.     . pirbuterol (MAXAIR) 200 MCG/INH inhaler Inhale 2 puffs into the lungs 4 (four) times daily as needed for wheezing or shortness of breath.     . polyethylene glycol (MIRALAX / GLYCOLAX) packet Take 17 g by mouth daily as needed for mild constipation.     . theophylline (UNIPHYL) 400 MG 24 hr tablet Take 400 mg by mouth at bedtime.     [name] thyroid (ARMOUR) 60 MG tablet Take 60 mg by mouth at bedtime.     . verapamil (CALAN-SR) 180 MG CR tablet Take 180 mg by mouth at bedtime.      Home: Home Living Family/patient expects to be discharged to:: Skilled nursing facility Living Arrangements: Spouse/significant other Available Help at Discharge: Family, Available 24 hours/day Type of Home: House  Functional History: Prior Function Level of Independence: Needs assistance Gait / Transfers Assistance Needed: pt indicates since fall she only ambulates short distances in the house with a cane ADL's / Homemaking Assistance Needed: A with ADLs sicne fall and daughter performs all homemaking tasks. Functional Status:  Mobility: Bed Mobility Overal bed mobility: Needs Assistance, +2 for physical assistance Bed Mobility: Supine to Sit Supine to sit: Max assist, +2 for physical assistance, HOB elevated General bed mobility comments: pt needs cues for sequencing and encouragement. pt needs A for Bil LEs and bringing trunk up to sitting.  Transfers Overall transfer level: Needs assistance Equipment used: 1 person hand held assist, 2 person hand held assist Transfers: Sit to/from Stand, Stand Pivot Transfers Sit to  Stand: Min assist Stand pivot transfers: Min assist, +2 physical assistance General transfer comment: Pt requires max coaxing. She is very fearful of standing. She stood with min A x 1 with OT, but c/o dizziness. BP stable. Second stand, she requested her son also assist and she required min A ++2      ADL: ADL Overall ADL's : Needs assistance/impaired Eating/Feeding: Set up, Sitting Grooming: Wash/dry hands, Wash/dry face, Oral care, Brushing hair, Set up, Supervision/safety, Sitting Upper Body Bathing: Moderate assistance, Sitting Lower Body Bathing: Maximal assistance, Sit to/from stand Upper Body Dressing : Moderate assistance, Sitting Lower Body Dressing: Maximal assistance, Sit to/from stand Toilet Transfer: Minimal assistance, +2 for physical assistance, Stand-pivot, BSC Toileting- Clothing Manipulation and Hygiene: Total assistance, Sit to/from stand Functional mobility during ADLs: Minimal assistance, +2 for physical assistance General ADL Comments: Pt requires encouragement   Cognition: Cognition Overall Cognitive Status: Impaired/Different from baseline Arousal/Alertness: Awake/alert Orientation Level: Oriented X4 Attention: Focused Focused Attention: Appears intact Memory: Impaired Memory Impairment: Retrieval deficit, Decreased short term memory Decreased Short Term Memory: Verbal basic Problem Solving: Impaired Problem Solving Impairment: Verbal basic Behaviors: Poor frustration tolerance Safety/Judgment: Impaired Cognition Arousal/Alertness: Awake/alert Behavior During Therapy: WFL for tasks assessed/performed Overall Cognitive Status: Impaired/Different from baseline Area of Impairment: Attention, Memory, Safety/judgement, Awareness, Problem solving Orientation Level: Time Current Attention Level: Sustained Memory: Decreased recall of precautions, Decreased short-term memory Following Commands: Follows one step commands consistently Safety/Judgement:  Decreased awareness of safety, Decreased awareness of deficits Awareness: Emergent Problem Solving:  Slow processing, Decreased initiation, Difficulty sequencing, Requires verbal cues, Requires tactile cues General Comments: Pt with intermittent confusion during OT session   Blood pressure 160/81, pulse 75, temperature 99.4 F (37.4 C), temperature source Oral, resp. rate 18, height 5\' 6"  (1.676 m), weight 90.3 kg (199 lb 1.2 oz), last menstrual period 03/24/2010, SpO2 100 %. Physical Exam  Vitals reviewed. Constitutional: She appears well-developed and well-nourished.  HENT:  Head: Normocephalic.  Craniotomy site clean and dry  Eyes:  Pt unwilling to open eyes  Neck: Normal range of motion. Neck supple. No thyromegaly present.  Cardiovascular: Normal rate and regular rhythm.  Respiratory: Effort normal and breath sounds normal. No respiratory distress.  GI: Soft. Bowel sounds are normal. She exhibits no distension.  Musculoskeletal: She exhibits no tenderness.  PROM WNL  Neurological:  Mood is flat.  Pt unwilling to open eyes, answer orientation questions, participate in MMT and sensation testing  Skin: Skin is warm and dry.  Psychiatric: She is agitated. Cognition and memory are impaired. She expresses inappropriate judgment.  Unwilling to engage     Lab Results Last 24 Hours    No results found for this or any previous visit (from the past 24 hour(s)).    Imaging Results (Last 48 hours)    Ct Head Wo Contrast  02/09/2016 ADDENDUM REPORT: 02/09/2016 16:35 ADDENDUM: The results of this study were related to [place] on the floor by the Radiologist Assistant, and communication documented in the PACS or zVision Dashboard. Electronically Signed By: Genevie Ann M.D. On: 02/09/2016 16:35  02/09/2016 CLINICAL DATA: 82 year old female status post craniotomy for treatment of subdural hematoma. Initial encounter. EXAM: CT HEAD WITHOUT CONTRAST TECHNIQUE: Contiguous axial images were  obtained from the base of the skull through the vertex without intravenous contrast. COMPARISON: Head CTs 02/05/2016 and earlier. FINDINGS: Interval left parietal craniotomy. Otherwise stable visualized osseous structures. Stable extensive frontal and ethmoid sinus opacification. Mastoids and tympanic cavities remain clear. Postoperative changes to the scalp soft tissues. Stable orbits soft tissues. Continued mixed density left subdural hematoma, with superimposed small volume of pneumocephalus. Persistent rightward midline shift, which appears slightly increased from the preoperative study now 6 mm (versus 3 mm before). Effaced left lateral ventricle with no ventriculomegaly. There is a small volume of subdural hematoma along the interhemispheric fissure, primarily hypodense. Laterally the subdural collection measures 7-8 mm at most levels now versus 9 mm previously. Basilar cisterns remain patent. No other intracranial hemorrhage identified. No superimposed acute cortically based infarct identified. IMPRESSION: 1. Status post left parietal craniotomy. Small volume pneumocephalus. 2. Continued mixed density left subdural hematoma. Rightward midline shift has mildly increased, now 6 mm. 3. Effaced left lateral ventricle without ventriculomegaly. Basilar cisterns remain patent. Electronically Signed: By: Genevie Ann M.D. On: 02/09/2016 16:05     Assessment/Plan: Diagnosis: Traumatic subdural hematoma Labs and images independently reviewed. Records reviewed and summated above. [place] score: ?IV Speech to evaluate for Post traumatic amnesia and interval GOAT scores to assess progress. NeuroPsych evaluation for behavorial assessment. Provide environmental management by reducing the level of stimulation, tolerating restlessness when possible, protecting patient from harming self or others and reducing patient's cognitive confusion. Address  behavioral concerns include providing structured environments and daily routines. Cognitive therapy to direct modular abilities in order to maintain goals including problem solving, self regulation/monitoring, self management, attention, and memory. Fall precautions; pt at risk for second impact syndrome Prevention of secondary injury: monitor for hypotension, hypoxia, seizures or signs of increased ICP Prophylactic AED:  Consider pharmacological intervention if necessary with neurostimulants, such as amantadine, methylphenidate, modafinil, etc. Consider Propranolol for agitation and storming Avoid medications that could impair cognitive abilities, such as anticholinergics, antihistaminic, benzodiazapines, narcotics, etc when possible  1. Does the need for close, 24 hr/day medical supervision in concert with the patient's rehab needs make it unreasonable for this patient to be served in a less intensive setting? Yes  2. Co-Morbidities requiring supervision/potential complications: history of subdural hematoma after multiple falls (educate pt and family regarding falls precautions), HTN (monitor and provide prns in accordance with increased physical exertion and pain), DM (Monitor in accordance with exercise and adjust meds as necessary), COPD (monitor RR and O2 sats with increased physical activity), Tachycardia (monitor in accordance with pain and increasing activity) 3. Due to bladder management, safety, skin/wound care, disease management, medication administration and patient education, does the patient require 24 hr/day rehab nursing? Yes 4. Does the patient require coordinated care of a physician, rehab nurse, PT (1-2 hrs/day, 5 days/week), OT (1-2 hrs/day, 5 days/week) and SLP (1-2 hrs/day, 5 days/week) to address physical and functional deficits in the context of the above medical diagnosis(es)?  Yes Addressing deficits in the following areas: balance, endurance, locomotion, strength, transferring, bowel/bladder control, bathing, feeding, grooming, toileting, cognition and psychosocial support 5. Can the patient actively participate in an intensive therapy program of at least 3 hrs of therapy per day at least 5 days per week? Potentially 6. The potential for patient to make measurable gains while on inpatient rehab is excellent 7. Anticipated functional outcomes upon discharge from inpatient rehab are min assist and mod assist with PT, min assist and mod assist with OT, min assist and mod assist with SLP. 8. Estimated rehab length of stay to reach the above functional goals is: 20-24 days. 9. Does the patient have adequate social supports and living environment to accommodate these discharge functional goals? Potentially 10. Anticipated D/C setting: Home 11. Anticipated post D/C treatments: HH therapy and Home excercise program 12. Overall Rehab/Functional Prognosis: good and fair  RECOMMENDATIONS: This patient's condition is appropriate for continued rehabilitative care in the following setting: Patient require max assist with most therapies. She also is not able/unwilling to tolerate 3 hours of therapy per day. At this time, agree with physical therapy, recommend SNF. Patient has agreed to participate in recommended program. No Note that insurance prior authorization may be required for reimbursement for recommended care.  Comment: Rehab Admissions Coordinator to follow up.  Delice Lesch, MD 02/10/2016       Revision History     Date/Time User Provider Type Action   02/10/2016 1:18 PM Ankit [name], MD Physician Sign   02/10/2016 6:37 AM Cathlyn Parsons, PA-C Physician Assistant Pend   View Details Report       Routing History     Date/Time From To Method   02/10/2016 1:18 PM Ankit [name], MD Lavone Orn, MD Fax

## 2016-02-12 NOTE — Clinical Social Work Note (Signed)
Patient being admitted into IP REHAB. No longer requiring SNF placement.   Clinical Social Worker will sign off for now as social work intervention is no longer needed. Please consult [place] again if new need arises.  Glendon Axe, MSW, LCSWA [phone] 02/12/2016 3:14 PM

## 2016-02-12 NOTE — Progress Notes (Signed)
No acute events Occasional word finding difficulty Awake and alert Oriented Moving all extremities Bandage in place Stable Continue therapy Hopefully will meet rehab criteria

## 2016-02-12 NOTE — Progress Notes (Signed)
I have insurance approval to admit pt to inpt rehab today. I met with pt and her daughter, [name], at bedside today and they are in agreement to admit. I contacted Dr. Cyndy Freeze in surgery and he is in agreement to admit today. I will make the arrangements for admission. [phone]

## 2016-02-12 NOTE — Interval H&P Note (Signed)
Carolyn [name] was admitted today to Inpatient Rehabilitation with the diagnosis of traumatic subdural hematoma.  The patient's history has been reviewed, patient examined, and there is no change in status.  Patient continues to be appropriate for intensive inpatient rehabilitation.  I have reviewed the patient's chart and labs.  Questions were answered to the patient's satisfaction. The PAPE has been reviewed and assessment remains appropriate.  [name] [name] 02/12/2016, 8:50 PM

## 2016-02-13 ENCOUNTER — Inpatient Hospital Stay (HOSPITAL_COMMUNITY): Payer: PPO | Admitting: Physical Therapy

## 2016-02-13 ENCOUNTER — Inpatient Hospital Stay (HOSPITAL_COMMUNITY): Payer: PPO | Admitting: Speech Pathology

## 2016-02-13 ENCOUNTER — Inpatient Hospital Stay (HOSPITAL_COMMUNITY): Payer: PPO | Admitting: Occupational Therapy

## 2016-02-13 ENCOUNTER — Inpatient Hospital Stay (HOSPITAL_COMMUNITY): Payer: PPO

## 2016-02-13 DIAGNOSIS — I10 Essential (primary) hypertension: Secondary | ICD-10-CM | POA: Diagnosis not present

## 2016-02-13 DIAGNOSIS — R569 Unspecified convulsions: Secondary | ICD-10-CM | POA: Diagnosis not present

## 2016-02-13 DIAGNOSIS — S069X9S Unspecified intracranial injury with loss of consciousness of unspecified duration, sequela: Secondary | ICD-10-CM | POA: Diagnosis not present

## 2016-02-13 DIAGNOSIS — G44309 Post-traumatic headache, unspecified, not intractable: Secondary | ICD-10-CM | POA: Diagnosis not present

## 2016-02-13 LAB — URINALYSIS, ROUTINE W REFLEX MICROSCOPIC
Bilirubin Urine: NEGATIVE
Glucose, UA: NEGATIVE mg/dL
Hgb urine dipstick: NEGATIVE
Ketones, ur: NEGATIVE mg/dL
Nitrite: NEGATIVE
Protein, ur: NEGATIVE mg/dL
Specific Gravity, Urine: 1.027 (ref 1.005–1.030)
pH: 6 (ref 5.0–8.0)

## 2016-02-13 LAB — GLUCOSE, CAPILLARY
Glucose-Capillary: 134 mg/dL — ABNORMAL HIGH (ref 65–99)
Glucose-Capillary: 97 mg/dL (ref 65–99)
Glucose-Capillary: 155 mg/dL — ABNORMAL HIGH (ref 65–99)
Glucose-Capillary: 169 mg/dL — ABNORMAL HIGH (ref 65–99)
Glucose-Capillary: 150 mg/dL — ABNORMAL HIGH (ref 65–99)

## 2016-02-13 LAB — CBC WITH DIFFERENTIAL/PLATELET
Basophils Absolute: 0 10*3/uL (ref 0.0–0.1)
Basophils Relative: 0 %
Eosinophils Absolute: 0.6 10*3/uL (ref 0.0–0.7)
Eosinophils Relative: 7 %
HCT: 36.5 % (ref 36.0–46.0)
Hemoglobin: 12 g/dL (ref 12.0–15.0)
Lymphocytes Relative: 46 %
Lymphs Abs: 3.8 10*3/uL (ref 0.7–4.0)
MCH: 28.2 pg (ref 26.0–34.0)
MCHC: 32.9 g/dL (ref 30.0–36.0)
MCV: 85.9 fL (ref 78.0–100.0)
Monocytes Absolute: 0.7 10*3/uL (ref 0.1–1.0)
Monocytes Relative: 8 %
Neutro Abs: 3.2 10*3/uL (ref 1.7–7.7)
Neutrophils Relative %: 39 %
Platelets: 214 10*3/uL (ref 150–400)
RBC: 4.25 MIL/uL (ref 3.87–5.11)
RDW: 14 % (ref 11.5–15.5)
WBC: 8.2 10*3/uL (ref 4.0–10.5)

## 2016-02-13 LAB — URINE MICROSCOPIC-ADD ON

## 2016-02-13 LAB — COMPREHENSIVE METABOLIC PANEL
ALT: 9 U/L — ABNORMAL LOW (ref 14–54)
AST: 12 U/L — ABNORMAL LOW (ref 15–41)
Albumin: 2.9 g/dL — ABNORMAL LOW (ref 3.5–5.0)
Alkaline Phosphatase: 63 U/L (ref 38–126)
Anion gap: 9 (ref 5–15)
BUN: 9 mg/dL (ref 6–20)
CO2: 27 mmol/L (ref 22–32)
Calcium: 9.6 mg/dL (ref 8.9–10.3)
Chloride: 106 mmol/L (ref 101–111)
Creatinine, Ser: 0.76 mg/dL (ref 0.44–1.00)
GFR calc Af Amer: >60 mL/min (ref >60)
GFR calc non Af Amer: >60 mL/min (ref >60)
Glucose, Bld: 85 mg/dL (ref 65–99)
Potassium: 3.2 mmol/L — ABNORMAL LOW (ref 3.5–5.1)
Sodium: 142 mmol/L (ref 135–145)
Total Bilirubin: 0.8 mg/dL (ref 0.3–1.2)
Total Protein: 5.8 g/dL — ABNORMAL LOW (ref 6.5–8.1)

## 2016-02-13 MED ORDER — TOPIRAMATE 25 MG PO TABS
25.0000 mg | ORAL_TABLET | Freq: Every day | ORAL | Status: DC
  Administered 2016-02-13: 25 mg via ORAL
  Filled 2016-02-13: qty 1

## 2016-02-13 MED ORDER — POTASSIUM CHLORIDE CRYS ER 20 MEQ PO TBCR
20.0000 meq | EXTENDED_RELEASE_TABLET | Freq: Two times a day (BID) | ORAL | Status: AC
  Administered 2016-02-13 – 2016-02-14 (×4): 20 meq via ORAL
  Filled 2016-02-13 (×4): qty 1

## 2016-02-13 MED ORDER — TOPIRAMATE 25 MG PO TABS
25.0000 mg | ORAL_TABLET | Freq: Once | ORAL | Status: AC
  Administered 2016-02-13: 25 mg via ORAL
  Filled 2016-02-13: qty 1

## 2016-02-13 NOTE — Evaluation (Signed)
Speech Language Pathology Assessment and Plan  Patient Details  Name: Carolyn [name] MRN: [id number] Date of Birth: 25-Apr-1933  SLP Diagnosis: Cognitive Impairments  Rehab Potential: Excellent ELOS: 12-14 days     Today's Date: 02/14/2016 SLP Individual Time: 1100-1200 SLP Individual Time Calculation (min): 60 min   Problem List:  Patient Active Problem List   Diagnosis Date Noted  . Traumatic subdural hematoma ([place]) 02/12/2016  . Headache as late effect of brain injury ([place])   . Seizure ([place])   . Difficulty controlling behavior as late effect of traumatic brain injury ([place])   . Major neurocognitive disorder as late effect of traumatic brain injury with behavioral disturbance ([place])   . Memory dysfunction as late effect of traumatic brain injury ([place])   . Benign essential HTN   . DM type 2 with diabetic peripheral neuropathy ([place])   . Thyroid activity decreased   . TBI (traumatic brain injury) ([place])   . Cognitive deficit as late effect of traumatic brain injury ([place])   . Falls frequently   . Controlled type 2 diabetes mellitus with complication, without long-term current use of insulin ([place])   . Tachycardia   . Chronic obstructive pulmonary disease ([place])   . Subdural hematoma ([place]) 02/05/2016  . Chronic bronchitis ([place])   . SDH (subdural hematoma) ([place]) 01/20/2016  . Fall 01/20/2016  . Closed fracture of left elbow 01/20/2016  . Diabetes mellitus without complication ([place])   . Hypercholesterolemia   . COPD (chronic obstructive pulmonary disease) ([place])   . Hypothyroid   . Essential hypertension   . Inguinal hernia unilateral, non-recurrent, left 12/02/2011   Past Medical History:  Past Medical History  Diagnosis Date  . Diabetes mellitus   . Hypertension   . Hypercholesterolemia   . Gallstones   . Glaucoma   . Cataracts, bilateral   . COPD (chronic obstructive pulmonary disease) ([place])   . Hypothyroid   . PONV (postoperative nausea and vomiting)   . Asthma   .  Chronic kidney disease 2007    kidney cancer and partial nephrctomy-right  . Subdural bleeding [facility])    Past Surgical History:  Past Surgical History  Procedure Laterality Date  . Dilation and curettage of uterus    . Partial nephrectomy      cancer  . Breast biopsy    . D&c for enlarged uterine  210  . Total abdominal hysterectomy w/ bilateral salpingoophorectomy  2011  . Cateract extractions and iol    . Cholecystectomy  2008  . Abdominal hysterectomy    . Eye surgery  08/2011,09/2012    bilateral cataracts  . Hernia repair  01/19/12    inguinal  . Inguinal hernia repair  01/19/2012    Procedure: HERNIA REPAIR INGUINAL ADULT;  Surgeon: Earnstine Regal, MD;  Location: [facility];  Service: General;  Laterality: Left;  repair left inguinal hernia with mesh   . Craniotomy Left 02/06/2016    Procedure: CRANIOTOMY HEMATOMA EVACUATION SUBDURAL;  Surgeon: Kevan Ny Ditty, MD;  Location: [facility];  Service: Neurosurgery;  Laterality: Left;    Assessment / Plan / Recommendation Clinical Impression Patient is an 82 y.o. year old right handed female with history of hypertension, diabetes mellitus, COPD. Daughter is an Therapist, sports who is a full-time caregiver for both the patient and the patient's spouse. Patient with history of subdural hematoma after multiple falls beginning in March 2017 as well as left radial head fracture. Family notes patient with persistent headaches. Patient with recent  simple partial seizure and presented to the emergency department 02/06/2016. Cranial CT scan revealed enlarging chronic subdural hematoma. Underwent left craniotomy for evacuation of subdural hematoma 02/06/2016 per Dr. Bevely Palmer. Maintained on Keppra for suspect seizure. Tolerating a regular consistency diet. Repeat head CT on 3/26 suggesting increased pain & midline shift.  Patient transferred to CIR on 02/12/2016. Patient demonstrates mild-moderate cognitive impairments consistent with a [place] Level VI and demonstrates  impaired initiation, attention, awareness, problem solving, recall and overall safety with functional and familiar tasks. Patient also demonstrates delayed processing. Patient with decreased vocal intensity which can impact her overall speech intelligibility at the phrase and sentence level. Patient would benefit from skilled SLP intervention to maximize her cognitive function and speech intelligibility and overall functional independence prior to discharge home with family.   Skilled Therapeutic Interventions          Administered a cognitive-linguistic evaluation. Please see above for details. Educated the patient in regards to her current cognitive function and goals of skilled SLP intervention. She verbalized understanding.   SLP Assessment  Patient will need skilled Speech Lanaguage Pathology Services during CIR admission    Recommendations  Oral Care Recommendations: Oral care BID Recommendations for Other Services: Neuropsych consult Patient destination: Home Follow up Recommendations: Outpatient SLP;Home Health SLP;24 hour supervision/assistance Equipment Recommended: None recommended by SLP    SLP Frequency 3 to 5 out of 7 days   SLP Duration  SLP Intensity  SLP Treatment/Interventions 12-14 days   Minumum of 1-2 x/day, 30 to 90 minutes  Cognitive remediation/compensation;Cueing hierarchy;Functional tasks;Patient/family education;Therapeutic Activities;Internal/external aids;Environmental controls;Speech/Language facilitation    Pain No/Denies Pain   Function:   Cognition Comprehension Comprehension assist level: Understands basic 75 - 89% of the time/ requires cueing 10 - 24% of the time  Expression   Expression assist level: Expresses basic 50 - 74% of the time/requires cueing 25 - 49% of the time. Needs to repeat parts of sentences.  Social Interaction Social Interaction assist level: Interacts appropriately 50 - 74% of the time - May be physically or verbally  inappropriate.  Problem Solving Problem solving assist level: Solves basic 50 - 74% of the time/requires cueing 25 - 49% of the time  Memory Memory assist level: Recognizes or recalls 50 - 74% of the time/requires cueing 25 - 49% of the time   Short Term Goals: Week 1: SLP Short Term Goal 1 (Week 1): Patient will initiate functional tasks with Min A verbal and tactile cues.  SLP Short Term Goal 2 (Week 1): Patient will demonstrate functional problem solving for basic and familiar tasks with Min A verbal cues.  SLP Short Term Goal 3 (Week 1): Patient will demonstrate selective attention in a mildly distracting enviornment for 10 minutes with Min A verbal cues for redirection.  SLP Short Term Goal 4 (Week 1): Patient will self-monitor and correct errors with functional tasks with Min A question and verbal cues. SLP Short Term Goal 5 (Week 1): Patient will utilize external aids to recall new, daily information with Min A multimodal cues. SLP Short Term Goal 6 (Week 1): Patient will utilize an increased vocal intensity to maximize intelligigibility to ~90% at the sentence level with Min A verbal cues.   Refer to Care Plan for Long Term Goals  Recommendations for other services: Neuropsych  Discharge Criteria: Patient will be discharged from SLP if patient refuses treatment 3 consecutive times without medical reason, if treatment goals not met, if there is a change in medical status, if  patient makes no progress towards goals or if patient is discharged from hospital.  The above assessment, treatment plan, treatment alternatives and goals were discussed and mutually agreed upon: by patient  Yanelle Sousa 02/14/2016, 7:09 AM

## 2016-02-13 NOTE — Progress Notes (Signed)
Occupational Therapy Note  Patient Details  Name: BLONDIE CLUNE MRN: [id number] Date of Birth: 1933-05-07  Today's Date: 02/13/2016 OT Individual Time: 1330-1430 OT Individual Time Calculation (min): 60 min   Pt denied pain Individual Therapy  Pt resting in bed upon arrival with daughter present.  Pt requested use of BSC and performed stand pivot transfer bed<>BSC.  Pt required tot A for toileting tasks.  Pt transferred to w/c and completed brushing teeth and washing face seated in w/c.  Discussed role of OT and LTGs.  Pt and daughter verbalized understanding and agreed with LTGs. Requested that daughter bring in clothes for pt tomorrow.  Daughter stated that pt wore a nightgown and house coat with underpants during the day.  Pt did not typically wear socks but verbalized understanding that hospital policy required pt to wear socks.    Leotis Shames [facility] 02/13/2016, 2:58 PM

## 2016-02-13 NOTE — Evaluation (Signed)
Physical Therapy Assessment and Plan  Patient Details  Name: Carolyn [name] MRN: [id number] Date of Birth: Dec 13, 1932  PT Diagnosis: Abnormality of gait, Cognitive deficits, Difficulty walking and Muscle weakness Rehab Potential: Good ELOS: 12-14 days   Today's Date: 02/13/2016 PT Individual Time: 0900-1000 and 1450-1520 PT Individual Time Calculation (min): 60 min and 30 min    Problem List:  Patient Active Problem List   Diagnosis Date Noted  . Traumatic subdural hematoma ([place]) 02/12/2016  . Headache as late effect of brain injury ([place])   . Seizure ([place])   . Difficulty controlling behavior as late effect of traumatic brain injury ([place])   . 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)   . Memory dysfunction as late effect of traumatic brain injury ([place])   . Benign essential HTN   . DM type 2 with diabetic peripheral neuropathy ([place])   . Thyroid activity decreased   . TBI (traumatic brain injury) ([place])   . Cognitive deficit as late effect of traumatic brain injury ([place])   . Falls frequently   . Controlled type 2 diabetes mellitus with complication, without long-term current use of insulin ([place])   . Tachycardia   . Chronic obstructive pulmonary disease ([place])   . Subdural hematoma ([place]) 02/05/2016  . Chronic bronchitis ([place])   . SDH (subdural hematoma) ([place]) 01/20/2016  . Fall 01/20/2016  . Closed fracture of left elbow 01/20/2016  . Diabetes mellitus without complication ([place])   . Hypercholesterolemia   . COPD (chronic obstructive pulmonary disease) ([place])   . Hypothyroid   . Essential hypertension   . Inguinal hernia unilateral, non-recurrent, left 12/02/2011    Past Medical History:  Past Medical History  Diagnosis Date  . Diabetes mellitus   . Hypertension   . Hypercholesterolemia   . Gallstones   . Glaucoma   . Cataracts, bilateral   . COPD (chronic obstructive pulmonary disease) ([place])   . Hypothyroid    . PONV (postoperative nausea and vomiting)   . Asthma   . Chronic kidney disease 2007    kidney cancer and partial nephrctomy-right  . Subdural bleeding [facility])    Past Surgical History:  Past Surgical History  Procedure Laterality Date  . Dilation and curettage of uterus    . Partial nephrectomy      cancer  . Breast biopsy    . D&c for enlarged uterine  210  . Total abdominal hysterectomy w/ bilateral salpingoophorectomy  2011  . Cateract extractions and iol    . Cholecystectomy  2008  . Abdominal hysterectomy    . Eye surgery  08/2011,09/2012    bilateral cataracts  . Hernia repair  01/19/12    inguinal  . Inguinal hernia repair  01/19/2012    Procedure: HERNIA REPAIR INGUINAL ADULT;  Surgeon: Earnstine Regal, MD;  Location: [facility];  Service: General;  Laterality: Left;  repair left inguinal hernia with mesh   . Craniotomy Left 02/06/2016    Procedure: CRANIOTOMY HEMATOMA EVACUATION SUBDURAL;  Surgeon: Kevan Ny Ditty, MD;  Location: [facility];  Service: Neurosurgery;  Laterality: Left;    Assessment & Plan Clinical Impression: Carolyn [name] is a 82 y.o. right handed female with history of hypertension, diabetes mellitus, COPD. By chart review patient lives with husband and daughter in [place]. She used a walker prior to admission. One level home with 2 steps to entry. Daughter is an Therapist, sports who is a full-time caregiver  for both the patient and the patient's spouse. Patient with with history of subdural hematoma after multiple falls beginning in March 2017 as well as left radial head fracture. Family notes patient with persistent headaches. Patient with recent simple partial seizure and presented to the emergency department 02/06/2016. Cranial CT scan revealed enlarging chronic subdural hematoma. Underwent left craniotomy for evacuation of subdural hematoma 02/06/2016 per Dr. Cyndy Freeze. Maintained on Keppra for suspect seizure. Tolerating a regular consistency diet. Repeat  head CT on 3/26 suggesting increased pain midline shift. PhysicalAnd occupational therapy evaluations completed and ongoing. M.D. has requested physical medicine rehabilitation consult.Patient was admitted for comprehensive rehabilitation program. Patient transferred to CIR on 02/12/2016.   Patient currently requires min with mobility secondary to muscle weakness, decreased cardiorespiratoy endurance, decreased initiation, decreased memory and delayed processing and decreased standing balance and decreased balance strategies.  Prior to hospitalization, patient was modified independent  with mobility and lived with Daughter, Spouse in a House home.  Home access is 2Stairs to enter.  Patient will benefit from skilled PT intervention to maximize safe functional mobility, minimize fall risk and decrease caregiver burden for planned discharge home with 24 hour supervision.  Anticipate patient will benefit from follow up [place] at discharge.  PT - End of Session Activity Tolerance: Decreased this session;Tolerates 10 - 20 min activity with multiple rests Endurance Deficit: Yes Endurance Deficit Description: required seated rest breaks with functional mobility tasks PT Assessment Rehab Potential (ACUTE/IP ONLY): Good PT Patient demonstrates impairments in the following area(s): Balance;Edema;Endurance;Motor;Pain;Safety PT Transfers Functional Problem(s): Bed Mobility;Bed to Chair;Car;Furniture PT Locomotion Functional Problem(s): Ambulation;Wheelchair Mobility;Stairs PT Plan PT Intensity: Minimum of 1-2 x/day ,45 to 90 minutes PT Frequency: 5 out of 7 days PT Duration Estimated Length of Stay: 12-14 days PT Treatment/Interventions: Ambulation/gait training;Balance/vestibular training;Cognitive remediation/compensation;Community reintegration;Discharge planning;Disease management/prevention;DME/adaptive equipment instruction;Functional mobility training;Neuromuscular re-education;Pain management;Patient/family  education;Therapeutic Activities;Therapeutic Exercise;Stair training;UE/LE Strength taining/ROM;UE/LE Coordination activities;Wheelchair propulsion/positioning PT Transfers Anticipated Outcome(s): supervision PT Locomotion Anticipated Outcome(s): supervision PT Recommendation Follow Up Recommendations: Home health PT;24 hour supervision/assistance Patient destination: Home Equipment Recommended: To be determined Equipment Details: pt owns RW and [facility]  Skilled Therapeutic Intervention Treatment 1: Skilled therapeutic intervention initiated after completion of evaluation. Discussed with patient falls risk, safety within room, and focus of therapy during stay. Discussed possible length of stay, goals, and follow-up therapy. Switched out wheelchair for 20 x 16 wheelchair and adjusted leg rests to appropriate height for improved sitting tolerance and positioning. Patient required increased time for initiation verbally and with physical mobility tasks. Patient required mod encouragement to attempt tasks and required min A overall without use of AD. Patient limited by fatigue. Patient left semi reclined in bed, handoff to SLP.   Treatment 2: Patient in wheelchair with daughter present. Patient required encouragement and increased time to participate due to being cold. Discussed PLOF with patient and daughter reporting that patient used [facility] after radial head fracture but was using RW prior to injury and reported premorbid bilateral hip and knee pain. Gait training with use of RW x 28 ft with min guard and slow pace. Patient instructed in wheelchair parts management for locking/unlocking brakes and removing leg rests, required max verbal/visual cues and increased time to complete with daughter attempting to move patient's hands before letting her attempt task. Educated patient and daughter on need to give patient increased time due to delayed processing and impaired initiation, will need reinforcement. Patient  requesting to return to bed at end of session, performed stand pivot transfer using RW  and sit > supine with supervision but patient required assist to reposition in bed. Patient left semi reclined with needs in reach and daughter present.   PT Evaluation Precautions/Restrictions Precautions Precautions: Fall Restrictions Weight Bearing Restrictions: No General Chart Reviewed: Yes Family/Caregiver Present: No Vital Signs Pain Pain Assessment Pain Assessment: No/denies pain Home Living/Prior Functioning Home Living Available Help at Discharge: Family;Available 24 hours/day Type of Home: House Home Access: Stairs to enter [employer] of Steps: 2 Entrance Stairs-Rails: Left;Right;Can reach both Home Layout: One level Bathroom Shower/Tub: Tub/shower unit;Walk-in shower Bathroom Toilet: Handicapped height Bathroom Accessibility: Yes  Lives With: Daughter;Spouse Prior Function Level of Independence: Independent with basic ADLs;Independent with gait;Independent with homemaking with ambulation;Independent with transfers;Requires assistive device for independence  Able to Take Stairs?: No Driving: Yes Leisure: Hobbies-yes (Comment) Comments: reports using RW or SPC, could not specify when she used which AD, likles gardening Vision/Perception  Vision - Assessment Eye Alignment: Within Functional Limits Tracking/Visual Pursuits: Able to track stimulus in all quads without difficulty  Cognition Overall Cognitive Status: Impaired/Different from baseline Arousal/Alertness: Awake/alert Attention: Focused;Sustained Focused Attention: Appears intact Sustained Attention: Impaired Sustained Attention Impairment: Functional basic Memory Impairment: Retrieval deficit;Decreased short term memory Awareness: Impaired Awareness Impairment: Intellectual impairment Problem Solving: Impaired Problem Solving Impairment: Functional basic Behaviors: Poor frustration  tolerance Safety/Judgment: Impaired Sensation Sensation Light Touch: Impaired Detail Light Touch Impaired Details: Impaired RUE Proprioception: Impaired Detail Proprioception Impaired Details: Impaired RUE Additional Comments: right hand can drop things in ADL tasks with decr awareness Coordination Gross Motor Movements are Fluid and Coordinated: No Fine Motor Movements are Fluid and Coordinated: No Motor  Motor Motor: Within Functional Limits Motor - Skilled Clinical Observations: generalized weakness  Mobility Bed Mobility Bed Mobility: Sit to Supine Supine to Sit: 5: Supervision;With rails Transfers Transfers: Yes Sit to Stand: 4: Min assist Stand to Sit: 4: Min assist Locomotion  Ambulation Ambulation: Yes Ambulation/Gait Assistance: 4: Min assist Ambulation Distance (Feet): 5 Feet Assistive device: None Gait Gait: Yes Gait Pattern: Impaired Gait Pattern: Decreased stride length;Trunk flexed;Antalgic;Lateral trunk lean to right;Lateral trunk lean to left Gait velocity: decreased Stairs / Additional Locomotion Stairs: Yes Stairs Assistance: 4: Min assist Stair Management Technique: Two rails;Step to pattern;Forwards Number of Stairs: 8 Height of Stairs: 3 Wheelchair Mobility Wheelchair Mobility: No  Trunk/Postural Assessment  Cervical Assessment Cervical Assessment: Within Functional Limits Thoracic Assessment Thoracic Assessment: Exceptions to [facility] (forward flexion) Lumbar Assessment Lumbar Assessment: Exceptions to [facility] (posterior pelvic tilt) Postural Control Postural Control: Deficits on evaluation Protective Responses: delayed  Balance Balance Balance Assessed: Yes Static Sitting Balance Static Sitting - Balance Support: Feet supported Static Sitting - Level of Assistance: 5: Stand by assistance Dynamic Sitting Balance Dynamic Sitting - Balance Support: During functional activity;Feet supported;No upper extremity supported Dynamic Sitting - Level of  Assistance: 4: Min Insurance risk surveyor Standing - Balance Support: During functional activity;Bilateral upper extremity supported Static Standing - Level of Assistance: 3: Mod assist;4: Min assist Dynamic Standing Balance Dynamic Standing - Balance Support: During functional activity;Bilateral upper extremity supported Dynamic Standing - Level of Assistance: 3: Mod assist Extremity Assessment  RUE Assessment RUE Assessment: Exceptions to [facility] RUE AROM (degrees) RUE Overall AROM Comments: 0-90 degrees shoulder flexion; distally appears WFL RUE Strength RUE Overall Strength Comments: overall 3/5 RUE Tone RUE Tone: Within Functional Limits LUE Assessment LUE Assessment: Exceptions to [facility] LUE AROM (degrees) Overall AROM Left Upper Extremity: Due to pain (0-90 with shouldre flexion ) LUE Strength LUE Overall Strength Comments: has  had a previous fx to left radial head - has a splint will continue to look into splint schedule with MD; overall strength 3/5 LUE Tone LUE Tone: Within Functional Limits RLE Assessment RLE Assessment: Exceptions to [facility] RLE Strength RLE Overall Strength: Deficits RLE Overall Strength Comments: hip flexion 4/5, knee flexion/extension 3+ to 4-/5 and painful, ankle DF/PF 5/5 LLE Assessment LLE Assessment: Exceptions to [facility] LLE Strength LLE Overall Strength: Deficits LLE Overall Strength Comments: hip flexion 4/5, knee flexion/extension 3+/5 and painful, ankle DF/PF 5/5   See Function Navigator for Current Functional Status.   Refer to Care Plan for Long Term Goals  Recommendations for other services: None  Discharge Criteria: Patient will be discharged from PT if patient refuses treatment 3 consecutive times without medical reason, if treatment goals not met, if there is a change in medical status, if patient makes no progress towards goals or if patient is discharged from hospital.  The above assessment, treatment plan, treatment  alternatives and goals were discussed and mutually agreed upon: by patient  Laretta Alstrom 02/13/2016, 12:57 PM

## 2016-02-13 NOTE — Progress Notes (Signed)
[facility] PHYSICAL MEDICINE & REHABILITATION     PROGRESS NOTE    Subjective/Complaints: Uneventful night but still having headaches. More severe in the area of injury. Able to participate in bathing this morning. Slow to process per OT.  ROS limited due to cognition/speech  Objective: Vital Signs: Blood pressure 140/70, pulse 57, temperature 98.5 F (36.9 C), temperature source Oral, resp. rate 18, height 5\' 6"  (1.676 m), last menstrual period 03/24/2010, SpO2 97 %. No results found.  Recent Labs  02/13/16 0426  WBC 8.2  HGB 12.0  HCT 36.5  PLT 214    Recent Labs  02/13/16 0426  NA 142  K 3.2*  CL 106  GLUCOSE 85  BUN 9  CREATININE 0.76  CALCIUM 9.6   CBG (last 3)   Recent Labs  02/12/16 2103 02/13/16 0622  GLUCAP 122* 97    Wt Readings from Last 3 Encounters:  02/05/16 90.3 kg (199 lb 1.2 oz)  01/21/16 114.2 kg (251 lb 12.3 oz)  01/20/16 95.709 kg (211 lb)    Physical Exam:  Constitutional: She appears well-developed and well-nourished.  HENT: oral mucosa fairly moist Head: Normocephalic.  Craniotomy site clean and dry with bandage stapled to scalp Eyes: Reactive light  Neck: Normal range of motion. Neck supple. No thyromegaly present.  Cardiovascular: Normal rate and regular rhythm.  Respiratory: Effort normal and breath sounds normal. No respiratory distress.  GI: Soft. Bowel sounds are normal. She exhibits no distension.  Musculoskeletal: She exhibits no tenderness.  PROM WNL  Neurological:  Mood is flat. Delayed processing and initiation. Speech very soft but language appears to be functional when she does initiate. Patient more alert at times per report Motor: appears to be 3+ to 4/5 through all 4 limbs. Skin: Skin is warm and dry.  Psychiatric: She is flat. Cognition and memory are impaired. She expresses inappropriate judgment   Assessment/Plan: 1. Functional deficits secondary to acute on chronic SDH which require 3+ hours per  day of interdisciplinary therapy in a comprehensive inpatient rehab setting. Physiatrist is providing close team supervision and 24 hour management of active medical problems listed below. Physiatrist and rehab team continue to assess barriers to discharge/monitor patient progress toward functional and medical goals.  Function:  Bathing Bathing position   Position: Shower  Bathing parts Body parts bathed by patient: Right arm, Left arm, Chest, Abdomen Body parts bathed by helper: Front perineal area, Buttocks, Right upper leg, Left upper leg, Right lower leg, Left lower leg, Back  Bathing assist Assist Level: Touching or steadying assistance(Pt > 75%)      Upper Body Dressing/Undressing Upper body dressing   What is the patient wearing?: Hospital gown                Upper body assist Assist Level: Set up   Set up : To obtain clothing/put away  Lower Body Dressing/Undressing Lower body dressing   What is the patient wearing?: Hospital Gown, Non-skid slipper socks           Non-skid slipper socks- Performed by helper: Don/doff right sock, Don/doff left sock                  Lower body assist Assist for lower body dressing: Touching or steadying assistance (Pt > 75%)      Toileting Toileting   Toileting steps completed by patient: Performs perineal hygiene Toileting steps completed by helper: Adjust clothing after toileting, Adjust clothing prior to toileting    Toileting assist  Assist level: Touching or steadying assistance (Pt.75%)   Transfers Chair/bed Clinical biochemist          Cognition Comprehension Comprehension assist level: Understands basic 90% of the time/cues < 10% of the time  Expression Expression assist level: Expresses basic 25 - 49% of the time/requires cueing 50 - 75% of the time. Uses single words/gestures.  Social Interaction Social Interaction assist level: Interacts appropriately  25 - 49% of time - Needs frequent redirection.  Problem Solving Problem solving assist level: Solves basic problems with no assist  Memory Memory assist level: Recognizes or recalls 90% of the time/requires cueing < 10% of the time  Medical Problem List and Plan: 1. 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 DVT Prophylaxis/Anticoagulation: SCDs. mobilize 3. Pain Management: Tylenol as needed  -persistent neurogenic headaches---topamax trial 4. Seizure disorder. Continue Keppra 1000 mg twice a day. 5. Neuropsych: This patient is not capable of making decisions on her own behalf. 6. Skin/Wound Care: Routine skin checks 7. Fluids/Electrolytes/Nutrition: Routine I&O with follow-up chemistries 8. COPD. Continue nebulizer treatment and inhalers as directed. Check oxygen saturations every shift 9. Hypertension. Lisinopril 20 mg daily, verapamil 180 mg daily at bedtime. Monitor with increased mobility 10. Diabetes mellitus with peripheral neuropathy. Hemoglobin A1c 6.7. Amaryl 4 mg daily. Check blood sugars before meals and at bedtime  -fair control at present 11. Hypothyroidism. Thyroid 60 mg daily at bedtime. TSH 0.285.   LOS (Days) 1 A FACE TO FACE EVALUATION WAS PERFORMED  SWARTZ,ZACHARY T 02/13/2016 9:36 AM

## 2016-02-13 NOTE — Evaluation (Signed)
Occupational Therapy Assessment and Plan  Patient Details  Name: Carolyn [name] MRN: [id number] Date of Birth: Sep 14, 1933  OT Diagnosis: acute pain, cognitive deficits and muscle weakness (generalized) Rehab Potential: Rehab Potential (ACUTE ONLY): Good ELOS: 2 weeks   Today's Date: 02/13/2016 OT Individual Time: 0800-0900 OT Individual Time Calculation (min): 60 min     Problem List:  Patient Active Problem List   Diagnosis Date Noted  . Traumatic subdural hematoma ([place]) 02/12/2016  . Headache as late effect of brain injury ([place])   . Seizure ([place])   . Difficulty controlling behavior as late effect of traumatic brain injury ([place])   . 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 Memory dysfunction as late effect of traumatic brain injury ([place])   . Benign essential HTN   . DM type 2 with diabetic peripheral neuropathy ([place])   . Thyroid activity decreased   . TBI (traumatic brain injury) ([place])   . Cognitive deficit as late effect of traumatic brain injury ([place])   . Falls frequently   . Controlled type 2 diabetes mellitus with complication, without long-term current use of insulin ([place])   . Tachycardia   . Chronic obstructive pulmonary disease ([place])   . Subdural hematoma ([place]) 02/05/2016  . Chronic bronchitis ([place])   . SDH (subdural hematoma) ([place]) 01/20/2016  . Fall 01/20/2016  . Closed fracture of left elbow 01/20/2016  . Diabetes mellitus without complication ([place])   . Hypercholesterolemia   . COPD (chronic obstructive pulmonary disease) ([place])   . Hypothyroid   . Essential hypertension   . Inguinal hernia unilateral, non-recurrent, left 12/02/2011    Past Medical History:  Past Medical History  Diagnosis Date  . Diabetes mellitus   . Hypertension   . Hypercholesterolemia   . Gallstones   . Glaucoma   . Cataracts, bilateral   . COPD (chronic obstructive pulmonary disease) ([place])   . Hypothyroid   . PONV  (postoperative nausea and vomiting)   . Asthma   . Chronic kidney disease 2007    kidney cancer and partial nephrctomy-right  . Subdural bleeding [facility])    Past Surgical History:  Past Surgical History  Procedure Laterality Date  . Dilation and curettage of uterus    . Partial nephrectomy      cancer  . Breast biopsy    . D&c for enlarged uterine  210  . Total abdominal hysterectomy w/ bilateral salpingoophorectomy  2011  . Cateract extractions and iol    . Cholecystectomy  2008  . Abdominal hysterectomy    . Eye surgery  08/2011,09/2012    bilateral cataracts  . Hernia repair  01/19/12    inguinal  . Inguinal hernia repair  01/19/2012    Procedure: HERNIA REPAIR INGUINAL ADULT;  Surgeon: Earnstine Regal, MD;  Location: [facility] ORS;  Service: General;  Laterality: Left;  repair left inguinal hernia with mesh   . Craniotomy Left 02/06/2016    Procedure: CRANIOTOMY HEMATOMA EVACUATION SUBDURAL;  Surgeon: Kevan Ny Ditty, MD;  Location: [facility] NEURO ORS;  Service: Neurosurgery;  Laterality: Left;    Assessment & Plan Clinical Impression: Patient is a 82 y.o. year old right handed female with history of hypertension, diabetes mellitus, COPD. By chart review patient lives with husband and daughter in [place]. She used a walker prior to admission. One level home with 2 steps to entry. Daughter is an Therapist, sports who is a full-time caregiver for both  the patient and the patient's spouse. Patient with with history of subdural hematoma after multiple falls beginning in March 2017 as well as left radial head fracture. Family notes patient with persistent headaches. Patient with recent simple partial seizure and presented to the emergency department 02/06/2016. Cranial CT scan revealed enlarging chronic subdural hematoma. Underwent left craniotomy for evacuation of subdural hematoma 02/06/2016 per Dr. Cyndy Freeze. Maintained on Keppra for suspect seizure. Tolerating a regular consistency diet. Repeat head  CT on 3/26 suggesting increased pain midline shift.  Patient transferred to CIR on 02/12/2016 .    Patient currently requires min to mod A (however no clothes on eval) with basic self-care skills and min to mod A for basic mobility secondary to muscle weakness, decreased cardiorespiratoy endurance and acute headaches, impaired timing and sequencing, decr sensation/ propreception on right, , decreased initiation, decreased attention, decreased awareness, decreased problem solving, decreased safety awareness and delayed processing and decreased sitting balance, decreased standing balance, decreased postural control and decreased balance strategies.  Prior to hospitalization, patient could complete ADL with supervision with multiple falls  Patient will benefit from skilled intervention to decrease level of assist with basic self-care skills and increase independence with basic self-care skills prior to discharge home with care partner.  Anticipate patient will require 24 hour supervision and follow up home health.  OT - End of Session Activity Tolerance: Tolerates 10 - 20 min activity with multiple rests Endurance Deficit: Yes OT Assessment Rehab Potential (ACUTE ONLY): Good OT Patient demonstrates impairments in the following area(s): Balance;Cognition;Edema;Endurance;Motor;Pain;Perception;Safety;Sensory;Skin Integrity OT Basic ADL's Functional Problem(s): Grooming;Bathing;Dressing;Toileting;Eating OT Transfers Functional Problem(s): Toilet;Tub/Shower OT Additional Impairment(s): Fuctional Use of Upper Extremity OT Plan OT Intensity: Minimum of 1-2 x/day, 45 to 90 minutes OT Frequency: 5 out of 7 days OT Duration/Estimated Length of Stay: 2 weeks OT Treatment/Interventions: Balance/vestibular training;Cognitive remediation/compensation;Community reintegration;Discharge planning;DME/adaptive equipment instruction;Disease mangement/prevention;Functional electrical stimulation;Functional mobility  training;Neuromuscular re-education;Pain management;Psychosocial support;Patient/family education;Self Care/advanced ADL retraining;Splinting/orthotics;Therapeutic Exercise;UE/LE Coordination activities;UE/LE Strength taining/ROM;Therapeutic Activities;Skin care/wound managment OT Self Feeding Anticipated Outcome(s): supevision (VC) OT Basic Self-Care Anticipated Outcome(s): supervision OT Toileting Anticipated Outcome(s): supervision  OT Bathroom Transfers Anticipated Outcome(s): supervision  OT Recommendation Patient destination: Home Follow Up Recommendations: Home health OT Equipment Recommended: To be determined   Skilled Therapeutic Intervention OT eval intiated with OT purpose, role and goals discussed. Self care retraining including bed mobility, basic transfers, sit to stands, bathing at shower level , activity tolerance, initiation verbally and motorically, functional problem solving etc. Pt required more than reasonable amt of time throughout session to initiate answering a question and motoric ally. Pt able to perform sit to stands with min to mod A; requiring extra time to come up into standing and A to ensure controlled decent. Pt with no clothes on eval to assess dressing however suspect pt will require increased A to complete LB dressing. Pt fatigues quickly. Pt was oriented to time, place, situation and person.   OT Evaluation Precautions/Restrictions  Precautions Precautions: Fall Precaution Comments: Daughter reports Dr Gladstone Lighter removed previous long arm cast and gave pt a splint to wear mostly for night time use  Restrictions Weight Bearing Restrictions: No General Chart Reviewed: Yes Family/Caregiver Present: No   Pain Pain Assessment Pain Assessment: No/denies pain Pain Score: 7  Pain Type: Acute pain;Chronic pain Pain Location: Head Pain Intervention(s): RN made aware Home Living/Prior Functioning Home Living Available Help at Discharge: Family, Available 24  hours/day Type of Home: House Home Access: Stairs to enter [employer] of Steps: 2 Entrance Stairs-Rails:  Left, Right, Can reach both Home Layout: One level Bathroom Shower/Tub: Tub/shower unit, Multimedia programmer: Handicapped height Bathroom Accessibility: Yes Additional Comments: daughter lives w/ pt and pt's husband.  Pt's husband mainly sits or stays in bed and is not very active, requiring assist from daughter for ADLs and ambulation.  Lives With: Daughter, Spouse Prior Function Level of Independence: Independent with basic ADLs, Independent with gait, Independent with homemaking with ambulation, Independent with transfers, Requires assistive device for independence  Able to Take Stairs?: No Driving: Yes Leisure: Hobbies-yes (Comment) Comments: reports using RW or SPC, could not specify when she used which AD, likles gardening ADL   Vision/Perception  Vision- Assessment Vision Assessment?: Yes Eye Alignment: Within Functional Limits Tracking/Visual Pursuits: Able to track stimulus in all quads without difficulty Visual Fields: No apparent deficits  Cognition Overall Cognitive Status: Impaired/Different from baseline Arousal/Alertness: Awake/alert Orientation Level: Person;Place;Situation Person: Oriented Place: Oriented Situation: Oriented Year: 2017 Month: March Day of Week: Incorrect (no answer "I dont know") Memory Impairment: Retrieval deficit;Decreased short term memory Immediate Memory Recall: Sock;Blue;Bed Memory Recall: Sock;Blue (unable to recall bed even with cue) Memory Recall Sock: Without Cue Memory Recall Blue: Without Cue Attention: Focused;Sustained Focused Attention: Appears intact Sustained Attention: Impaired Sustained Attention Impairment: Functional basic Awareness: Impaired Awareness Impairment: Intellectual impairment Problem Solving: Impaired Problem Solving Impairment: Functional basic Behaviors: Poor frustration  tolerance Safety/Judgment: Impaired Sensation Sensation Light Touch: Impaired Detail Light Touch Impaired Details: Impaired RUE Proprioception: Impaired Detail Proprioception Impaired Details: Impaired RUE Additional Comments: right hand can drop things in ADL tasks with decr awareness Coordination Gross Motor Movements are Fluid and Coordinated: No Fine Motor Movements are Fluid and Coordinated: No Motor  Motor Motor - Skilled Clinical Observations: generalized weakness Mobility  Bed Mobility Bed Mobility: Supine to Sit Supine to Sit: 3: Mod assist;HOB elevated;With rails Transfers Transfers: Sit to Stand;Stand to Sit Sit to Stand: 3: Mod assist Stand to Sit: 3: Mod assist  Trunk/Postural Assessment  Cervical Assessment Cervical Assessment: Within Functional Limits Thoracic Assessment Thoracic Assessment:  (forward flexion) Lumbar Assessment Lumbar Assessment:  (posterior pelvic tilt) Postural Control Postural Control: Deficits on evaluation Protective Responses: delayed  Balance Balance Balance Assessed: Yes Static Sitting Balance Static Sitting - Balance Support: Feet supported Static Sitting - Level of Assistance: 5: Stand by assistance Dynamic Sitting Balance Dynamic Sitting - Balance Support: During functional activity;Feet supported;No upper extremity supported Dynamic Sitting - Level of Assistance: 4: Min Insurance risk surveyor Standing - Balance Support: During functional activity;Bilateral upper extremity supported Static Standing - Level of Assistance: 3: Mod assist;4: Min assist Dynamic Standing Balance Dynamic Standing - Balance Support: During functional activity;Bilateral upper extremity supported Dynamic Standing - Level of Assistance: 3: Mod assist Extremity/Trunk Assessment RUE Assessment RUE Assessment: Exceptions to [facility] RUE AROM (degrees) RUE Overall AROM Comments: 0-90 degrees shoulder flexion; distally appears WFL RUE  Strength RUE Overall Strength Comments: overall 3/5 RUE Tone RUE Tone: Within Functional Limits LUE Assessment LUE Assessment: Exceptions to WFL LUE AROM (degrees) Overall AROM Left Upper Extremity: Due to pain (0-90 with shouldre flexion ) LUE Strength LUE Overall Strength Comments: has had a previous fx to left radial head - has a splint will continue to look into splint schedule with MD; overall strength 3/5 LUE Tone LUE Tone: Within Functional Limits   See Function Navigator for Current Functional Status.   Refer to Care Plan for Long Term Goals  Recommendations for other services: Neuropsych ?? maybe  Discharge Criteria: Patient  will be discharged from OT if patient refuses treatment 3 consecutive times without medical reason, if treatment goals not met, if there is a change in medical status, if patient makes no progress towards goals or if patient is discharged from hospital.  The above assessment, treatment plan, treatment alternatives and goals were discussed and mutually agreed upon: by patient  Nicoletta Ba 02/13/2016, 11:04 AM

## 2016-02-13 NOTE — Progress Notes (Signed)
Daughter expressed concern that patient was not as responsive as earlier before toileting. Allowed back in bed, allowed to rest. Rechecked VS= 98.3,129/68,pulse 70,Resp=17. CBG=150. Patient alert, verbally responsive. Appropriately answered questions. Nodded head when questioned if tired. Vance Gather

## 2016-02-13 NOTE — Care Management Note (Signed)
Inpatient [facility] Individual Statement of Services  Patient Name:  Carolyn [name]  Date:  02/13/2016  Welcome to the [facility].  Our goal is to provide you with an individualized program based on your diagnosis and situation, designed to meet your specific needs.  With this comprehensive rehabilitation program, you will be expected to participate in at least 3 hours of rehabilitation therapies Monday-Friday, with modified therapy programming on the weekends.  Your rehabilitation program will include the following services:  Physical Therapy (PT), Occupational Therapy (OT), Speech Therapy (ST), 24 hour per day rehabilitation nursing, Therapeutic Recreaction (TR), Neuropsychology, Case Management (Social Worker), Rehabilitation Medicine, Nutrition Services and Pharmacy Services  Weekly team conferences will be held on Tuesdays to discuss your progress.  Your Social Worker will talk with you frequently to get your input and to update you on team discussions.  Team conferences with you and your family in attendance may also be held.  Expected length of stay: 2 weeks  Overall anticipated outcome: supervision  Depending on your progress and recovery, your program may change. Your Social Worker will coordinate services and will keep you informed of any changes. Your Social Worker's name and contact numbers are listed  below.  The following services may also be recommended but are not provided by the [facility] will be made to provide these services after discharge if needed.  Arrangements include referral to agencies that provide these services.  Your insurance has been verified to be:  [employer] Your primary doctor is:  Drl Lavone [facility]  Pertinent information will be shared with your doctor and your insurance  company.  Social Worker:  [place], [place] or ([phone]   Information discussed with and copy given to patient by: Lennart Pall, 02/13/2016, 3:08 PM

## 2016-02-14 ENCOUNTER — Inpatient Hospital Stay (HOSPITAL_COMMUNITY): Payer: PPO | Admitting: Occupational Therapy

## 2016-02-14 ENCOUNTER — Inpatient Hospital Stay (HOSPITAL_COMMUNITY): Payer: PPO

## 2016-02-14 ENCOUNTER — Inpatient Hospital Stay (HOSPITAL_COMMUNITY): Payer: PPO | Admitting: Speech Pathology

## 2016-02-14 DIAGNOSIS — I6200 Nontraumatic subdural hemorrhage, unspecified: Secondary | ICD-10-CM | POA: Diagnosis not present

## 2016-02-14 DIAGNOSIS — I10 Essential (primary) hypertension: Secondary | ICD-10-CM | POA: Diagnosis not present

## 2016-02-14 DIAGNOSIS — G44309 Post-traumatic headache, unspecified, not intractable: Secondary | ICD-10-CM | POA: Diagnosis not present

## 2016-02-14 DIAGNOSIS — E1142 Type 2 diabetes mellitus with diabetic polyneuropathy: Secondary | ICD-10-CM | POA: Diagnosis not present

## 2016-02-14 DIAGNOSIS — S069X9S Unspecified intracranial injury with loss of consciousness of unspecified duration, sequela: Secondary | ICD-10-CM | POA: Diagnosis not present

## 2016-02-14 LAB — GLUCOSE, CAPILLARY
Glucose-Capillary: 126 mg/dL — ABNORMAL HIGH (ref 65–99)
Glucose-Capillary: 130 mg/dL — ABNORMAL HIGH (ref 65–99)
Glucose-Capillary: 137 mg/dL — ABNORMAL HIGH (ref 65–99)
Glucose-Capillary: 89 mg/dL (ref 65–99)

## 2016-02-14 LAB — URINE CULTURE

## 2016-02-14 NOTE — Progress Notes (Signed)
Physical Therapy Session Note  Patient Details  Name: Carolyn [name] MRN: [id number] Date of Birth: 1933-03-21  Today's Date: 02/14/2016 PT Individual Time: 1000-1035 PT Individual Time Calculation (min): 35 min   Short Term Goals: Week 1:  PT Short Term Goal 1 (Week 1): Patient will ambulate 50 ft using LRAD with supervision. PT Short Term Goal 2 (Week 1): Patient will negotiate 2 (6") stairs using 2 rails with min A.  PT Short Term Goal 3 (Week 1): Patient will maintain standing balance x 3 min with supervision.  PT Short Term Goal 4 (Week 1): Patient will perform bed mobility with consistent supervision.  Skilled Therapeutic Interventions/Progress Updates:    Pt with report of being very tired and initially declining any therapy or to leave the room. Pt agreeable to transfer back to bed to rest due to fatigue. Focused on pt initiating functional tasks such as unlocking w/c, pushing it forward, and performing transfer and bed mobility. Attempted to have pt pick up item off the floor but pt refused stating she was too tired. Pt required overall steady assist for sit to stand and transfer with supervision for return to supine and repositioning with cues for technique and extra time for initiation. Pt initially with difficulty getting her words out but as she was resting in supine, started asking appropriate questions and pt able to verbalize more clearly things about her dog, her fall, PLOF, and questions about her brain bleeding. Education provided throughout out as well as d/c discussion and goals (per pt questions). Transport arrived for CT and pt missed 25 min of session.   Therapy Documentation Precautions:  Precautions Precautions: Fall Precaution Comments: Daughter reports Dr Gladstone Lighter removed previous long arm cast and gave pt a splint to wear mostly for night time use  Restrictions Weight Bearing Restrictions: No General: PT Amount of Missed Time (min): 25 Minutes PT Missed Treatment  Reason: CT/MRI Pain:  Denies pain. Reporting fatigue.    See Function Navigator for Current Functional Status.   Therapy/Group: Individual Therapy  [name], PT, DPT  02/14/2016, 10:53 AM

## 2016-02-14 NOTE — IPOC Note (Signed)
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 ([place])   Memory dysfunction as late effect of traumatic brain injury ([place])   Benign essential HTN   DM type 2 with diabetic peripheral neuropathy ([place])   Thyroid activity decreased     Functional Problem List: Nursing Bladder, Behavior, Endurance, Medication Management, Motor, Nutrition, Pain, Perception, Safety, Sensory, Skin Integrity  PT Balance, Edema, Endurance, Motor, Pain, Safety  OT Balance, Cognition, Edema, Endurance, Motor, Pain, Perception, Safety, Sensory, Skin Integrity  SLP Cognition  TR         Basic ADL's: OT Grooming, Bathing, Dressing, Toileting, Eating     Advanced  ADL's: OT       Transfers: PT Bed Mobility, Bed to Chair, Car, Manufacturing systems engineer, Metallurgist: PT Ambulation, Emergency planning/management officer, Stairs     Additional Impairments: OT Fuctional Use of Upper Extremity  SLP Social Cognition   Social Interaction, Problem Solving, Memory, Attention, Awareness  TR      Anticipated Outcomes Item Anticipated Outcome  Self Feeding supevision (VC)  Swallowing      Basic self-care  supervision  Toileting  supervision    Bathroom Transfers supervision   Bowel/Bladder  manged min assist   Transfers  supervision  Locomotion  supervision  Communication     Cognition  Supervision  Pain  min assist   Safety/Judgment  mod assist    Therapy Plan: PT Intensity: Minimum of 1-2 x/day ,45 to 90 minutes PT Frequency: 5 out of 7 days PT Duration Estimated Length of Stay: 12-14 days OT Intensity:  Minimum of 1-2 x/day, 45 to 90 minutes OT Frequency: 5 out of 7 days OT Duration/Estimated Length of Stay: 2 weeks SLP Intensity: Minumum of 1-2 x/day, 30 to 90 minutes SLP Frequency: 3 to 5 out of 7 days SLP Duration/Estimated Length of Stay: 12-14 days        Team Interventions: Nursing Interventions Patient/Family Education, Bladder Management, Disease Management/Prevention, Pain Management, Medication Management, Skin Care/Wound Management, Cognitive Remediation/Compensation, Discharge Planning, Psychosocial Support  PT interventions Ambulation/gait training, Training and development officer, Cognitive remediation/compensation, Community reintegration, Discharge planning, Disease management/prevention, DME/adaptive equipment instruction, Functional mobility training, Neuromuscular re-education, Pain management, Patient/family education, Therapeutic Activities, Therapeutic Exercise, Stair training, UE/LE Strength taining/ROM, UE/LE Coordination activities, Wheelchair propulsion/positioning  OT Interventions Training and development officer, Cognitive remediation/compensation, Academic librarian, Discharge planning, DME/adaptive equipment instruction, Disease mangement/prevention, Functional electrical stimulation, Functional mobility training, Neuromuscular re-education, Pain management, Psychosocial support, Patient/family education, Self Care/advanced ADL retraining, Splinting/orthotics, Therapeutic Exercise, UE/LE Coordination activities, UE/LE Strength taining/ROM, Therapeutic Activities, Skin care/wound managment  SLP Interventions Cognitive remediation/compensation, Cueing hierarchy, Functional tasks, Patient/family education, Therapeutic Activities, Internal/external aids, Environmental controls, Speech/Language facilitation  TR Interventions    SW/CM Interventions Discharge Planning, Psychosocial Support, Patient/Family Education    Team Discharge Planning: Destination: PT-Home ,OT- Home ,  SLP-Home Projected Follow-up: PT-Home health PT, 24 hour supervision/assistance, OT-  Home health OT, SLP-Outpatient SLP, Home Health SLP, 24 hour supervision/assistance Projected Equipment Needs: PT-To be determined, OT- To be determined, SLP-None recommended by SLP Equipment Details: PT-pt owns RW and SPC, OT-  Patient/family involved in discharge planning: PT-  Patient,  OT-Patient, SLP-Patient  MD ELOS: 12-14 days Medical Rehab Prognosis:  Excellent Assessment: The patient has been admitted for CIR therapies with the diagnosis of TBI. The team will be addressing functional mobility, strength, stamina, balance, safety, adaptive techniques and equipment, self-care, bowel and bladder mgt, patient and caregiver education, NMR, visual-spatial awareness, pain control, BI education, communication, swallowing, behavior, cognition. Goals have been set at supervision for self-care,ADL's, mobility, and cognition/communicaiton.    Meredith Staggers, MD, FAAPMR      See Team Conference Notes for weekly updates to the plan of care

## 2016-02-14 NOTE — Progress Notes (Signed)
Occupational Therapy Note  Patient Details  Name: Carolyn [name] MRN: [id number] Date of Birth: 1933-10-21  Today's Date: 02/14/2016 OT Individual Time: [id number] OT Individual Time Calculation (min): 25 min  and Today's Date: 02/14/2016 OT Missed Time: 35 Minutes Missed Time Reason: Patient fatigue;Pain  1:1 Pt in bed this pm.  Pt remained nonverbal the entire session despite numerous opportunities and extra time to respond. Pt with notable right hand and LE weakness compared to yesterday when this therapist evaluated her. Pt came to EOB with elevated HOB with mod A. Max A for sit to stand and stand pivot over to [facility] to void. Pt still requires more time to response motorically. Total A for toileting tasks. Pt with difficult advancing right foot with stand step transfer back to bed. Required A for bilateral LEs into bed.  Discussed with primary therapist Tom and nursing. Left pt in bed with NT to take vital.s   Willeen Cass [facility] 02/14/2016, 3:14 PM

## 2016-02-14 NOTE — Progress Notes (Signed)
Occupational Therapy Session Note  Patient Details  Name: GLORINE GERARDO MRN: [id number] Date of Birth: [birth year]  Today's Date: 02/14/2016 OT Individual Time: 0800-0900 OT Individual Time Calculation (min): 60 min    Short Term Goals: Week 1:  OT Short Term Goal 1 (Week 1): Pt will transfer to toilet with supervision  OT Short Term Goal 2 (Week 1): Pt will perform 3/3 toileting steps with mod A  OT Short Term Goal 3 (Week 1): Pt will perform LB dressing (underwear, pants, socks and shoes ) with mod A OT Short Term Goal 4 (Week 1): Pt will stand to complete 2 grooming tasks with steadying A with one UE support before needing to sit OT Short Term Goal 5 (Week 1): Pt will initiate tasks with min prompting in a 60 min session   Skilled Therapeutic Interventions/Progress Updates:    Pt resting in bed upon arrival.  Pt slow to arouse and respond to requests.  Pt performed stand pivot transfer to [facility] with mod A and then transferred to w/c to engaged in BADLs at sink.  Pt washed her face but did not complete any further BADL tasks.  Pt communicated that she was upset about nursing care during the night and did not want the same nurse again.  Nursing Assistant Director notified and attended to patient. Pt exhibited difficulty completing sentences and required max questioning cues to effectively communicate.  Pt unable to perform tasks during session secondary to emotional distress from prior evening.  Pt stood at sink with min A to facilitate donning of brief.  Pt does not currently have clothing and donned clean hospital gown.  Pt remained in w/c with QRB in place and all needs within reach.   Therapy Documentation Precautions:  Precautions Precautions: Fall Precaution Comments: Daughter reports Dr Gladstone Lighter removed previous long arm cast and gave pt a splint to wear mostly for night time use  Restrictions Weight Bearing Restrictions: No   Pain:  Pt denied pain  See Function Navigator for  Current Functional Status.   Therapy/Group: Individual Therapy  Leroy Libman 02/14/2016, 9:17 AM

## 2016-02-14 NOTE — Progress Notes (Signed)
[place] PHYSICAL MEDICINE & REHABILITATION     PROGRESS NOTE    Subjective/Complaints: Slow to arouse. Seemed to sleep better last night. Headaches slightly improved.  ROS limited due to cognition/speech  Objective: Vital Signs: Blood pressure 150/77, pulse 67, temperature 98.7 F (37.1 C), temperature source Oral, resp. rate 18, height 5\' 6"  (1.676 m), last menstrual period 03/24/2010, SpO2 96 %. No results found.  Recent Labs  02/13/16 0426  WBC 8.2  HGB 12.0  HCT 36.5  PLT 214    Recent Labs  02/13/16 0426  NA 142  K 3.2*  CL 106  GLUCOSE 85  BUN 9  CREATININE 0.76  CALCIUM 9.6   CBG (last 3)   Recent Labs  02/13/16 1223 02/13/16 1652 02/13/16 1952  GLUCAP 155* 169* 150*    Wt Readings from Last 3 Encounters:  02/05/16 90.3 kg (199 lb 1.2 oz)  01/21/16 114.2 kg (251 lb 12.3 oz)  01/20/16 95.709 kg (211 lb)    Physical Exam:  Constitutional: She appears well-developed and well-nourished.  HENT: oral mucosa fairly moist Head: Normocephalic.  Craniotomy site clean and dry with bandage stapled to scalp Eyes: Reactive light  Neck: Normal range of motion. Neck supple. No thyromegaly present.  Cardiovascular: Normal rate and regular rhythm.  Respiratory: Effort normal and breath sounds normal. No respiratory distress.  GI: Soft. Bowel sounds are normal. She exhibits no distension.  Musculoskeletal: She exhibits no tenderness.  PROM WNL  Neurological:  Mood remains flat. Delayed processing and initiation. Speech very soft but language appears to be functional when she does initiate. Patient more alert at times per report Motor: appears to be 3+ to 4/5 through all 4 limbs. Skin: Skin is warm and dry.  Psychiatric: She is flat. Cognition and memory are impaired. She expresses inappropriate judgment   Assessment/Plan: 1. Functional deficits secondary to acute on chronic SDH which require 3+ hours per day of interdisciplinary therapy in a  comprehensive inpatient rehab setting. Physiatrist is providing close team supervision and 24 hour management of active medical problems listed below. Physiatrist and rehab team continue to assess barriers to discharge/monitor patient progress toward functional and medical goals.  Function:  Bathing Bathing position   Position: Shower  Bathing parts Body parts bathed by patient: Right arm, Left arm, Chest, Abdomen Body parts bathed by helper: Front perineal area, Buttocks, Right upper leg, Left upper leg, Right lower leg, Left lower leg, Back  Bathing assist Assist Level: Touching or steadying assistance(Pt > 75%)      Upper Body Dressing/Undressing Upper body dressing   What is the patient wearing?: Hospital gown                Upper body assist Assist Level: Set up   Set up : To obtain clothing/put away  Lower Body Dressing/Undressing Lower body dressing   What is the patient wearing?: Hospital Gown, Non-skid slipper socks           Non-skid slipper socks- Performed by helper: Don/doff right sock, Don/doff left sock                  Lower body assist Assist for lower body dressing: Touching or steadying assistance (Pt > 75%)      Toileting Toileting   Toileting steps completed by patient: Performs perineal hygiene Toileting steps completed by helper: Adjust clothing prior to toileting, Performs perineal hygiene, Adjust clothing after toileting    Toileting assist Assist level: Touching or steadying assistance (Pt.75%)  Transfers Chair/bed transfer   Chair/bed transfer method: Stand pivot Chair/bed transfer assist level: Touching or steadying assistance (Pt > 75%)       Locomotion Ambulation     Max distance: 5 ft Assist level: Touching or steadying assistance (Pt > 75%)   Wheelchair          Cognition Comprehension Comprehension assist level: Understands basic 75 - 89% of the time/ requires cueing 10 - 24% of the time  Expression Expression  assist level: Expresses basic 50 - 74% of the time/requires cueing 25 - 49% of the time. Needs to repeat parts of sentences.  Social Interaction Social Interaction assist level: Interacts appropriately 50 - 74% of the time - May be physically or verbally inappropriate.  Problem Solving Problem solving assist level: Solves basic 50 - 74% of the time/requires cueing 25 - 49% of the time  Memory Memory assist level: Recognizes or recalls 75 - 89% of the time/requires cueing 10 - 24% of the time  Medical Problem List and Plan: 1. Persistent headaches seizure, mobility and cognitive deficits secondary to acute on chronic traumatic subdural hematoma  -continue CIR therapies  -follow closely for continued or increased lethargy/hypoarousal 2. DVT Prophylaxis/Anticoagulation: SCDs. mobilize 3. Pain Management: Tylenol as needed  -persistent neurogenic headaches---topamax trial 25mg  qhs 4. Seizure disorder. Continue Keppra 1000 mg twice a day. 5. Neuropsych: This patient is not capable of making decisions on her own behalf. 6. Skin/Wound Care: Routine skin checks 7. Fluids/Electrolytes/Nutrition: Replete K+, encourage PO 8. COPD. Continue nebulizer treatment and inhalers as directed. Check oxygen saturations every shift 9. Hypertension. Lisinopril 20 mg daily, verapamil 180 mg daily at bedtime. Monitor with increased mobility 10. Diabetes mellitus with peripheral neuropathy. Hemoglobin A1c 6.7. Amaryl 4 mg daily.  -sugars 97-169 yesterday---continue to monitor 11. Hypothyroidism. Thyroid 60 mg daily at bedtime. TSH 0.285.   LOS (Days) 2 A FACE TO FACE EVALUATION WAS PERFORMED  Koren Plyler T 02/14/2016 8:49 AM

## 2016-02-14 NOTE — Progress Notes (Signed)
Patient assessed this am in change of shift and noted to asleep. Night shift RN reported patient appeared very tired during the night and required more than enough time to respond and transfer for toileting needs. Patient assessed during OT session this am and noted to have increased wording difficulties and expressing ideas. Patient noted to be frustrated with attempting to communicate with staff. Emotional support provided. DDonia Guiles, PA notified and orders received. Patient noted to flucuate with how she responded throughout day. Patient less verbal and noted with a stare at staff when given a command to follow. Patient was noted with decrease mobility in right side especially right hand. Patient requiring max cues to complete any task. Patient more verbal during lunch and required staff to feed her. Patient began using her left hand and stopped. Patient refused to feed self and staff began. Patient noted with poor po intake. Patient was able to express she did not get enough medication this am than she is used to. Reviewed meds with patient and she said "ok". Patient given tylenol for headache at 1140 and slept. Patient more alert at dinner time and daughter in room attempting to talk patient into eating more. Patient noted with delayed responses and attempted to feed self with left hand using a knife. Staff set up patient with a fork and spoon and patient put utensils down and grabbed a knife but began feeding self with left hand. Patient remains the same at this time. Emotional support given to daughter with change in status. Continue with plan of care .  Mliss Sax

## 2016-02-14 NOTE — Progress Notes (Signed)
Social Work Assessment and Plan Social Work Assessment and Plan  Patient Details  Name: Carolyn [name] MRN: [id number] Date of Birth: 03-07-1933  Today's Date: 02/14/2016  Problem List:  Patient Active Problem List   Diagnosis Date Noted  . Traumatic subdural hematoma ([place]) 02/12/2016  . Headache as late effect of brain injury ([place])   . Seizure ([place])   . Difficulty controlling behavior as late effect of traumatic brain injury ([place])   . 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 Memory dysfunction as late effect of traumatic brain injury ([place])   . Benign essential HTN   . DM type 2 with diabetic peripheral neuropathy ([place])   . Thyroid activity decreased   . TBI (traumatic brain injury) ([place])   . Cognitive deficit as late effect of traumatic brain injury ([place])   . Falls frequently   . Controlled type 2 diabetes mellitus with complication, without long-term current use of insulin ([place])   . Tachycardia   . Chronic obstructive pulmonary disease ([place])   . Subdural hematoma ([place]) 02/05/2016  . Chronic bronchitis ([place])   . SDH (subdural hematoma) ([place]) 01/20/2016  . Fall 01/20/2016  . Closed fracture of left elbow 01/20/2016  . Diabetes mellitus without complication ([place])   . Hypercholesterolemia   . COPD (chronic obstructive pulmonary disease) ([place])   . Hypothyroid   . Essential hypertension   . Inguinal hernia unilateral, non-recurrent, left 12/02/2011   Past Medical History:  Past Medical History  Diagnosis Date  . Diabetes mellitus   . Hypertension   . Hypercholesterolemia   . Gallstones   . Glaucoma   . Cataracts, bilateral   . COPD (chronic obstructive pulmonary disease) ([place])   . Hypothyroid   . PONV (postoperative nausea and vomiting)   . Asthma   . Chronic kidney disease 2007    kidney cancer and partial nephrctomy-right  . Subdural bleeding [facility])    Past Surgical History:  Past Surgical History   Procedure Laterality Date  . Dilation and curettage of uterus    . Partial nephrectomy      cancer  . Breast biopsy    . D&c for enlarged uterine  210  . Total abdominal hysterectomy w/ bilateral salpingoophorectomy  2011  . Cateract extractions and iol    . Cholecystectomy  2008  . Abdominal hysterectomy    . Eye surgery  08/2011,09/2012    bilateral cataracts  . Hernia repair  01/19/12    inguinal  . Inguinal hernia repair  01/19/2012    Procedure: HERNIA REPAIR INGUINAL ADULT;  Surgeon: Earnstine Regal, MD;  Location: [facility] ORS;  Service: General;  Laterality: Left;  repair left inguinal hernia with mesh   . Craniotomy Left 02/06/2016    Procedure: CRANIOTOMY HEMATOMA EVACUATION SUBDURAL;  Surgeon: Kevan Ny Ditty, MD;  Location: [facility] NEURO ORS;  Service: Neurosurgery;  Laterality: Left;   Social History:  reports that she quit smoking about 34 years ago. Her smoking use included Cigarettes. She has a 5 pack-year smoking history. She has never used smokeless tobacco. She reports that she does not drink alcohol or use illicit drugs.  Family / Support Systems Marital Status: Married Patient Roles: Spouse, Parent Spouse/Significant Other: husband, Shantia Funderburke @ [phone] Children: daughter, Palma Holter @ [phone] (living with pt);  daughter, Mashala Sarka [facility]) @ ([phone] and son, Barnabas Lister (living in [place], [place]) Anticipated Caregiver: daughter, [name] Ability/Limitations of  Caregiver: daughter RN of 68 yrs, states she has a bad back  Caregiver Availability: 24/7 Family Dynamics: Daughter very supportive and has been providing assist to her father already. She notes that her father has health issues and very prone to falling.  Social History Preferred language: English Religion: Lutheran Cultural Background: NA Education: HS Read: Yes Write: Yes Employment Status: Retired Freight forwarder Issues: None Guardian/Conservator: None - per MD, pt is not  capable of making decisions on her own behalf.  Defer to spouse/ daughter.   Abuse/Neglect Physical Abuse: Denies Verbal Abuse: Denies Sexual Abuse: Denies Exploitation of patient/patient's resources: Denies Self-Neglect: Denies  Emotional Status Pt's affect, behavior adn adjustment status: Pt with very flat affect and soft, mumbling speech.  She is able to provide some basic demographic information.  Appears to have a good, basic understanding of her medical issues.  Concern of some mood issues/ depression symptoms and will refer for neurospychology consult. Recent Psychosocial Issues: None Pyschiatric History: None Substance Abuse History: None  Patient / Family Perceptions, Expectations & Goals Pt/Family understanding of illness & functional limitations: Pt reports "I broke my head."  hhas general awareness of the surgery performed and aware she is on the rehab unit.  Daughter with very good understanding as she worked in the Neuro OR for 38 yrs. Premorbid pt/family roles/activities: Pt was completely independent PTA and still driving per daughter. Anticipated changes in roles/activities/participation: Goals set for supervision overall, however, daughter may need to provide some level of physical assistance at times. Daughter to provide caregiver support Pt/family expectations/goals: Daughter hopeful pt can reach supervision goals.  Community Resources [employer]s: None Premorbid Home Care/DME Agencies: None Transportation available at discharge: yes Resource referrals recommended: Neuropsychology, Support group (specify)  Discharge Planning Living Arrangements: Spouse/significant other, Children Support Systems: Spouse/significant other, Children Type of Residence: Private residence Insurance Resources: Multimedia programmer (specify) Tax inspector) Financial Resources: Radio broadcast assistant Screen Referred: No Living Expenses: Own Money Management: Family Does  the patient have any problems obtaining your medications?: No Home Management: shared by daughter and pt Patient/Family Preliminary Plans: Pt to d/c home with spouse and daughter.  Daughter to provide primary caregiver support. Social Work Anticipated Follow Up Needs: HH/OP Expected length of stay: 2 weeks  Clinical Impression Frail appearing elderly woman here following a traumatic SDH and craniotomy.  Very soft spoken and mumbled speech but is able to complete a basic assessment interview without much difficulty.  All info confirmed later by daughter as correct. Very flat affect and not very engaged - have already referred for neuropsychology consult.  Daughter living with pt and her spouse and can provide 24/7 assistance.  Daughter is a retired Therapist, sports (worked Designer, fashion/clothing OR).  Will follow for support and d/c planning needs.  Calina Patrie 02/14/2016, 11:56 AM

## 2016-02-14 NOTE — Progress Notes (Signed)
Speech Language Pathology Daily Session Note  Patient Details  Name: Carolyn [name] MRN: [id number] Date of Birth: March 16, 1933  Today's Date: 02/14/2016 SLP Individual Time: 0900-1000 SLP Individual Time Calculation (min): 60 min  Short Term Goals: Week 1: SLP Short Term Goal 1 (Week 1): Patient will initiate functional tasks with Min A verbal and tactile cues.  SLP Short Term Goal 2 (Week 1): Patient will demonstrate functional problem solving for basic and familiar tasks with Min A verbal cues.  SLP Short Term Goal 3 (Week 1): Patient will demonstrate selective attention in a mildly distracting enviornment for 10 minutes with Min A verbal cues for redirection.  SLP Short Term Goal 4 (Week 1): Patient will self-monitor and correct errors with functional tasks with Min A question and verbal cues. SLP Short Term Goal 5 (Week 1): Patient will utilize external aids to recall new, daily information with Min A multimodal cues. SLP Short Term Goal 6 (Week 1): Patient will utilize an increased vocal intensity to maximize intelligigibility to ~90% at the sentence level with Min A verbal cues.   Skilled Therapeutic Interventions:Skilled treatment session focused on cognitive goals. Upon arrival, patient was sitting upright in the wheelchair while consuming her breakfast while RN was administering medications.  Patient required Max A multimodal cues for problem solving, initiation and selective attention to self-feeding tasks in a moderately distracting environment. However, cueing decreased to Mod A verbal cues by end of session with minimizing of distractions. Patient appeared more lethargic today compared to yesterday's session with obvious difficulty with verbal expression requiring extra time and Max A multimodal cues characterized by decreased word-finding and phonemic paraphasias in which the patient had emergent awareness of with decreased ability to self-correct.  However, as the session progressed and  the patient became more alert, her language improved and patient was able to make her needs known with extra time and overall Min A multimodal cues but still with increased difficulty than previous session. RN made aware.  Patient also required Max-Total  A multimodal cues to decode at the word level while attempting to read her schedule but difficult to differentiate between language impairment vs. visual impairments vs. a combination of both. Patient's cognitive-linguistic function will require continued diagnostic treatment. Patient left upright in wheelchair with all needs within reach and quick release belt in place. Continue with current plan of care.    Function:  Cognition Comprehension Comprehension assist level: Understands basic 75 - 89% of the time/ requires cueing 10 - 24% of the time  Expression   Expression assist level: Expresses basic 50 - 74% of the time/requires cueing 25 - 49% of the time. Needs to repeat parts of sentences.  Social Interaction Social Interaction assist level: Interacts appropriately 50 - 74% of the time - May be physically or verbally inappropriate.  Problem Solving Problem solving assist level: Solves basic 25 - 49% of the time - needs direction more than half the time to initiate, plan or complete simple activities  Memory Memory assist level: Recognizes or recalls 25 - 49% of the time/requires cueing 50 - 75% of the time    Pain No/Denies Pain   Therapy/Group: Individual Therapy  Carolyn [name] 02/14/2016, 10:43 AM

## 2016-02-14 NOTE — Progress Notes (Signed)
Occupational Therapy Note  Patient Details  Name: Carolyn [name] MRN: [id number] Date of Birth: [birth year]  Today's Date: 02/14/2016 OT Individual Time: 1300-1330 OT Individual Time Calculation (min): 30 min   Pt with no s/s of pain Individual Therapy  Pt sitting in bed with HOB elevated while eating lunch.  Pt attempting to use her right hand for self feeding but exhibited difficulty grasping utensil.  Pt continued self feeding with right hand with hand over hand assist.  Pt exhibits shoulder, elbow, and wrist motion/movement WFL but unable to grasp therapist's fingers of flex/extend her digits.  Pt verbalized concern that she had experienced a stroke. Emotional support provided.  Pt noted with limited speech during session which decreased as session continued.  RN aware.  Pt remained in bed with all needs within reach.    Leotis Shames [facility] 02/14/2016, 3:13 PM

## 2016-02-15 ENCOUNTER — Inpatient Hospital Stay (HOSPITAL_COMMUNITY): Payer: PPO | Admitting: Speech Pathology

## 2016-02-15 ENCOUNTER — Inpatient Hospital Stay (HOSPITAL_COMMUNITY): Payer: PPO

## 2016-02-15 ENCOUNTER — Inpatient Hospital Stay (HOSPITAL_COMMUNITY): Payer: PPO | Admitting: Physical Therapy

## 2016-02-15 ENCOUNTER — Inpatient Hospital Stay (HOSPITAL_COMMUNITY): Payer: PPO | Admitting: Occupational Therapy

## 2016-02-15 DIAGNOSIS — S069X9S Unspecified intracranial injury with loss of consciousness of unspecified duration, sequela: Secondary | ICD-10-CM | POA: Diagnosis not present

## 2016-02-15 DIAGNOSIS — I10 Essential (primary) hypertension: Secondary | ICD-10-CM | POA: Diagnosis not present

## 2016-02-15 DIAGNOSIS — F918 Other conduct disorders: Secondary | ICD-10-CM | POA: Diagnosis not present

## 2016-02-15 DIAGNOSIS — E1142 Type 2 diabetes mellitus with diabetic polyneuropathy: Secondary | ICD-10-CM | POA: Diagnosis not present

## 2016-02-15 DIAGNOSIS — G44309 Post-traumatic headache, unspecified, not intractable: Secondary | ICD-10-CM | POA: Diagnosis not present

## 2016-02-15 DIAGNOSIS — R5383 Other fatigue: Secondary | ICD-10-CM | POA: Diagnosis not present

## 2016-02-15 LAB — CBC WITH DIFFERENTIAL/PLATELET
Basophils Absolute: 0.1 10*3/uL (ref 0.0–0.1)
Basophils Relative: 1 %
Eosinophils Absolute: 0.6 10*3/uL (ref 0.0–0.7)
Eosinophils Relative: 6 %
HCT: 37.9 % (ref 36.0–46.0)
Hemoglobin: 12.8 g/dL (ref 12.0–15.0)
Lymphocytes Relative: 37 %
Lymphs Abs: 3.5 10*3/uL (ref 0.7–4.0)
MCH: 29.4 pg (ref 26.0–34.0)
MCHC: 33.8 g/dL (ref 30.0–36.0)
MCV: 86.9 fL (ref 78.0–100.0)
Monocytes Absolute: 0.6 10*3/uL (ref 0.1–1.0)
Monocytes Relative: 7 %
Neutro Abs: 4.7 10*3/uL (ref 1.7–7.7)
Neutrophils Relative %: 49 %
Platelets: 225 10*3/uL (ref 150–400)
RBC: 4.36 MIL/uL (ref 3.87–5.11)
RDW: 13.9 % (ref 11.5–15.5)
WBC: 9.3 10*3/uL (ref 4.0–10.5)

## 2016-02-15 LAB — BASIC METABOLIC PANEL
Anion gap: 9 (ref 5–15)
BUN: 7 mg/dL (ref 6–20)
CO2: 22 mmol/L (ref 22–32)
Calcium: 9.8 mg/dL (ref 8.9–10.3)
Chloride: 105 mmol/L (ref 101–111)
Creatinine, Ser: 0.63 mg/dL (ref 0.44–1.00)
GFR calc Af Amer: >60 mL/min (ref >60)
GFR calc non Af Amer: >60 mL/min (ref >60)
Glucose, Bld: 132 mg/dL — ABNORMAL HIGH (ref 65–99)
Potassium: 3.8 mmol/L (ref 3.5–5.1)
Sodium: 136 mmol/L (ref 135–145)

## 2016-02-15 LAB — GLUCOSE, CAPILLARY
Glucose-Capillary: 160 mg/dL — ABNORMAL HIGH (ref 65–99)
Glucose-Capillary: 128 mg/dL — ABNORMAL HIGH (ref 65–99)
Glucose-Capillary: 123 mg/dL — ABNORMAL HIGH (ref 65–99)
Glucose-Capillary: 131 mg/dL — ABNORMAL HIGH (ref 65–99)

## 2016-02-15 LAB — URINALYSIS, ROUTINE W REFLEX MICROSCOPIC
Bilirubin Urine: NEGATIVE
Glucose, UA: NEGATIVE mg/dL
Hgb urine dipstick: NEGATIVE
Ketones, ur: NEGATIVE mg/dL
Leukocytes, UA: NEGATIVE
Nitrite: NEGATIVE
Protein, ur: NEGATIVE mg/dL
Specific Gravity, Urine: 1.018 (ref 1.005–1.030)
pH: 6.5 (ref 5.0–8.0)

## 2016-02-15 MED ORDER — GLUCERNA SHAKE PO LIQD
237.0000 mL | Freq: Three times a day (TID) | ORAL | Status: DC
  Administered 2016-02-16 – 2016-02-17 (×2): 237 mL via ORAL

## 2016-02-15 MED ORDER — DEXTROSE-NACL 5-0.45 % IV SOLN
INTRAVENOUS | Status: DC
  Administered 2016-02-15 – 2016-02-16 (×2): 1000 mL via INTRAVENOUS
  Administered 2016-02-16 – 2016-02-17 (×2): via INTRAVENOUS

## 2016-02-15 MED ORDER — LEVETIRACETAM 100 MG/ML PO SOLN
1000.0000 mg | Freq: Two times a day (BID) | ORAL | Status: DC
  Administered 2016-02-15 – 2016-02-16 (×3): 1000 mg via ORAL
  Filled 2016-02-15 (×3): qty 10

## 2016-02-15 MED ORDER — DEXTROSE 5 % IV SOLN
1.0000 g | Freq: Two times a day (BID) | INTRAVENOUS | Status: DC
  Administered 2016-02-15 – 2016-02-16 (×2): 1 g via INTRAVENOUS
  Filled 2016-02-15 (×3): qty 10

## 2016-02-15 NOTE — Progress Notes (Signed)
Speech Language Pathology Daily Session Note  Patient Details  Name: Carolyn [name] MRN: [id number] Date of Birth: 1933-07-03  Today's Date: 02/15/2016 SLP Individual Time: 1305-1330 SLP Individual Time Calculation (min): 25 min  Short Term Goals: Week 1: SLP Short Term Goal 1 (Week 1): Patient will initiate functional tasks with Min A verbal and tactile cues.  SLP Short Term Goal 2 (Week 1): Patient will demonstrate functional problem solving for basic and familiar tasks with Min A verbal cues.  SLP Short Term Goal 3 (Week 1): Patient will demonstrate selective attention in a mildly distracting enviornment for 10 minutes with Min A verbal cues for redirection.  SLP Short Term Goal 4 (Week 1): Patient will self-monitor and correct errors with functional tasks with Min A question and verbal cues. SLP Short Term Goal 5 (Week 1): Patient will utilize external aids to recall new, daily information with Min A multimodal cues. SLP Short Term Goal 6 (Week 1): Patient will utilize an increased vocal intensity to maximize intelligigibility to ~90% at the sentence level with Min A verbal cues.   Skilled Therapeutic Interventions: Found by pt's daughter who reported that her mother was unable to swallow. RN made aware and BSE completed. Pt was unable to participate in oral mech exam seondary to no following of directives. Pt was alert throughout the session. Pt positioned upright and presented with thin via teaspoon and straw sip and applesauce via 1/2 teaspoon amount. Pt demonstrated oral holding with appearance of piecemeal bolus management of liquids and puree. 4-6 swallows per bolus presented. No overt s/s aspiration, however pt did not verbalize or vocalize during PO trials despite verbal cueing and modeling of desired behavior. At the conclusion of the session, pt ultimately vocalized with max cueing for automatic speech task of counting 1-5- no vowel or consonant discrimination noted, vocalizations only.  Recommendations for Dys I diet with thin, small bites/sips, only when fully awake, monitor for oral clearance, full supervision by staff with all meals. Consider NPO if decline in function noted.  Again, RN was made aware and she spoke to the physician. Pt's daughter verbalized understanding and agreement with POC.   Function:  Eating Eating   Modified Consistency Diet: Yes Eating Assist Level: Helper feeds patient;Supervision or verbal cues           Cognition Comprehension Comprehension assist level: Understands basic less than 25% of the time/ requires cueing >75% of the time  Expression   Expression assist level: Expresses basis less than 25% of the time/requires cueing >75% of the time.  Social Interaction Social Interaction assist level: Interacts appropriately less than 25% of the time. May be withdrawn or combative.  Problem Solving Problem solving assist level: Solves basic less than 25% of the time - needs direction nearly all the time or does not effectively solve problems and may need a restraint for safety  Memory Memory assist level: Recognizes or recalls less than 25% of the time/requires cueing greater than 75% of the time    Pain Pain Assessment Pain Assessment: Faces Faces Pain Scale: No hurt  Therapy/Group: Individual Therapy  Vinetta Bergamo 02/15/2016, 3:14 PM

## 2016-02-15 NOTE — Progress Notes (Signed)
Patient able to swallow one pill and swallow liquids well this morning. Patient does have movement to RUE although not always on command. Patient has expressive aphasia-speaks very few words. This afternoon speech therapy with patient and patient holding food in mouth-taking very long time to swallow. Diet downgraded. Patient also having more trouble vocalizing. Rapid Response called and MD Kirsteins called this afternoon. Vitals stable. New orders received. With dinner- patient appears drowsy- not attempted pill. Reported off to Night Shift RN.

## 2016-02-15 NOTE — Progress Notes (Signed)
[facility]     PROGRESS NOTE    Subjective/Complaints: Awake alert starting breakfast  ROS limited due to cognition/speech, aphasia  Objective: Vital Signs: Blood pressure 129/76, pulse 75, temperature 97.7 F (36.5 C), temperature source Oral, resp. rate 17, height 5\' 6"  (1.676 m), last menstrual period 03/24/2010, SpO2 99 %. Ct Head Wo Contrast  02/14/2016  CLINICAL DATA:  Subdural hematoma EXAM: CT HEAD WITHOUT CONTRAST TECHNIQUE: Contiguous axial images were obtained from the base of the skull through the vertex without intravenous contrast. COMPARISON:  02/09/2016 FINDINGS: Changes of left craniotomy. Left mixed density subdural hematoma again noted, measuring 8 mm in thickness over the frontal lobe, stable. 5 mm of left-to-right midline shift. Decreasing pneumocephalus since prior study. No new hemorrhage. No hydrocephalus. IMPRESSION: Continued mixed density left subdural hematoma, essentially stable in size since prior study with stable mild left-to-right midline shift. Evolutionary changes within the subdural hematoma. Decreasing pneumocephalus. Electronically Signed   By: Rolm Baptise M.D.   On: 02/14/2016 11:02    Recent Labs  02/13/16 0426  WBC 8.2  HGB 12.0  HCT 36.5  PLT 214    Recent Labs  02/13/16 0426  NA 142  K 3.2*  CL 106  GLUCOSE 85  BUN 9  CREATININE 0.76  CALCIUM 9.6   CBG (last 3)   Recent Labs  02/14/16 1654 02/14/16 2056 02/15/16 0650  GLUCAP 137* 89 160*    Wt Readings from Last 3 Encounters:  02/05/16 90.3 kg (199 lb 1.2 oz)  01/21/16 114.2 kg (251 lb 12.3 oz)  01/20/16 95.709 kg (211 lb)    Physical Exam:  Constitutional: She appears well-developed and well-nourished.  HENT: oral mucosa fairly moist Head: Normocephalic.  Craniotomy site clean and dry with bandage stapled to scalp Eyes: Reactive light  Neck: Normal range of motion. Neck supple. No thyromegaly present.  Cardiovascular:  Normal rate and regular rhythm.  Respiratory: Effort normal and breath sounds normal. No respiratory distress.  GI: Soft. Bowel sounds are normal. She exhibits no distension.  Musculoskeletal: She exhibits no tenderness.  PROM WNL  Neurological:  Mood remains flat. Delayed processing and initiation. Speech very soft but language appears to be functional when she does initiate. Patient more alert at times per report Motor: appears to be 3+ in RUE, 5- in LUE , 4- BLE. Skin: Skin is warm and dry.  Psychiatric: She is flat. Cognition and memory are impaired. She expresses inappropriate judgment   Assessment/Plan: 1. Functional deficits secondary to acute on chronic SDH which require 3+ hours per day of interdisciplinary therapy in a comprehensive inpatient rehab setting. Physiatrist is providing close team supervision and 24 hour management of active medical problems listed below. Physiatrist and rehab team continue to assess barriers to discharge/monitor patient progress toward functional and medical goals.  Function:  Bathing Bathing position   Position: Shower  Bathing parts Body parts bathed by patient: Right arm, Left arm, Chest, Abdomen Body parts bathed by helper: Front perineal area, Buttocks, Right upper leg, Left upper leg, Right lower leg, Left lower leg, Back  Bathing assist Assist Level: Touching or steadying assistance(Pt > 75%)      Upper Body Dressing/Undressing Upper body dressing   What is the patient wearing?: Hospital gown                Upper body assist Assist Level: Set up   Set up : To obtain clothing/put away  Lower Body Dressing/Undressing Lower body dressing  What is the patient wearing?: Hospital Gown, Non-skid slipper socks           Non-skid slipper socks- Performed by helper: Don/doff right sock, Don/doff left sock                  Lower body assist Assist for lower body dressing: Touching or steadying assistance (Pt > 75%)       Toileting Toileting   Toileting steps completed by patient: Performs perineal hygiene Toileting steps completed by helper: Adjust clothing prior to toileting, Performs perineal hygiene, Adjust clothing after toileting    Toileting assist Assist level: Touching or steadying assistance (Pt.75%)   Transfers Chair/bed transfer   Chair/bed transfer method: Stand pivot Chair/bed transfer assist level: Touching or steadying assistance (Pt > 75%) Chair/bed transfer assistive device: Armrests     Locomotion Ambulation     Max distance: 5 ft Assist level: Touching or steadying assistance (Pt > 75%)   Wheelchair          Cognition Comprehension Comprehension assist level: Understands basic 50 - 74% of the time/ requires cueing 25 - 49% of the time  Expression Expression assist level: Expresses basic 25 - 49% of the time/requires cueing 50 - 75% of the time. Uses single words/gestures.  Social Interaction Social Interaction assist level: Interacts appropriately 25 - 49% of time - Needs frequent redirection.  Problem Solving Problem solving assist level: Solves basic 25 - 49% of the time - needs direction more than half the time to initiate, plan or complete simple activities  Memory Memory assist level: Recognizes or recalls 25 - 49% of the time/requires cueing 50 - 75% of the time  Medical Problem List and Plan: 1. 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 DVT Prophylaxis/Anticoagulation: SCDs. mobilize 3. Pain Management: Tylenol as needed  -persistent neurogenic headaches---topamax trial 25mg  qhs 4. Seizure disorder. Continue Keppra 1000 mg twice a day. 5. Neuropsych: This patient is not capable of making decisions on her own behalf. 6. Skin/Wound Care: Routine skin checks 7. Fluids/Electrolytes/Nutrition: Replete K+,  encourage PO 8. COPD. Continue nebulizer treatment and inhalers as directed. Check oxygen saturations every shift 9. Hypertension. Lisinopril 20 mg daily, verapamil 180 mg daily at bedtime. Monitor with increased mobility 10. Diabetes mellitus with peripheral neuropathy. Hemoglobin A1c 6.7. Amaryl 4 mg daily.  -sugars 97-169 yesterday---continue to monitor 11. Hypothyroidism. Thyroid 60 mg daily at bedtime. TSH 0.285.   LOS (Days) 3 A FACE TO FACE EVALUATION WAS PERFORMED  Charlett Blake 02/15/2016 8:56 AM

## 2016-02-15 NOTE — Progress Notes (Signed)
Occupational Therapy Session Note  Patient Details  Name: Carolyn [name] MRN: [id number] Date of Birth: 1933-06-22  Today's Date: 02/15/2016 OT Individual Time: 0900-1000    1st session OT Individual Time Calculation (min): 60 min    Short Term Goals: Week 1:  OT Short Term Goal 1 (Week 1): Pt will transfer to toilet with supervision  OT Short Term Goal 2 (Week 1): Pt will perform 3/3 toileting steps with mod A  OT Short Term Goal 3 (Week 1): Pt will perform LB dressing (underwear, pants, socks and shoes ) with mod A OT Short Term Goal 4 (Week 1): Pt will stand to complete 2 grooming tasks with steadying A with one UE support before needing to sit OT Short Term Goal 5 (Week 1): Pt will initiate tasks with min prompting in a 60 min session  Week 2:     Skilled Therapeutic Interventions/Progress Updates:    1st session:  Pt lying in bed this am Pt remained 75 % nonverbal the entire session.    Addressed following direction, bed mobility, transfers.  OT provided extra time to process directions.  She responded a "yes" that she needed to use the bathroom.  She went from supine to sitting EOB with min to mod assist.  Transferred to [facility] with needed increased time.  Pt urinated but had voided in brief Prior to toileting.  Pt sat on BSC and bathed UB with cues for organizing and sequencing body parts.  She refused to comb hair or brushed teeth.  She transferred with stand pivot transfer back to bed with min assist and cues for placement plus increased time. Pt.left in room with all items in reach.  2nd session:  Time:1500-1540  (40 min) Pain:  nione Individual session Pt.lying in bed.  Daughter present.  Daughter reported pt took 2 people to go from bed to [facility] a while ago.  She reported her speech is worse and her swallowing is delayed with pocketing on one side.  Pt able to roll to left side using RUE as gross assist.  She demonstrated active shoulder flexion to 90 degrees with elbow flexion, but  poor distal control with wrist in flexion and in extension.  Pt not responding to questions.  Reported to daughter the first OT treatment this AM and the difference now.  Discussed with RN and ST.  Further tests are in the works to determine what is going on.  Daughter states she thinks it the residual effects from the hemorrhage last week.   Therapy Documentation Precautions:  Precautions Precautions: Fall Precaution Comments: Restrictions Weight Bearing Restrictions: No      Pain: Pain Assessment Pain Assessment: Faces Faces Pain Scale: No hurt  1st session          See Function Navigator for Current Functional Status.   Therapy/Group: Individual Therapy  Lisa Roca 02/15/2016, 4:12 PM

## 2016-02-15 NOTE — Progress Notes (Signed)
Called by RN for "second set of eyes".  As per RN, patient having trouble swallowing and with talking.  Upon my arrival to patients room, family at bedside.  Patient sitting up in bed, as per family patient seems to be declining in the last couple of days.  Family states that she is having trouble swallowing and speech now said that it is taking her 6 swallows to get food down, daughter also concerned about less movement with right upper extremity.  On my evaluation patient not speaking or answering my questions, seems to be moving all extremities, appears to be tired.  RN has updated MD and orders received.  NO RRT interventions.  RN to call if assistance needed

## 2016-02-15 NOTE — Progress Notes (Signed)
Speech Language Pathology Daily Session Note  Patient Details  Name: Carolyn [name] MRN: [id number] Date of Birth: September 16, 1933  Today's Date: 02/15/2016 SLP Individual Time: [id number] SLP Individual Time Calculation (min): 45 min  Short Term Goals: Week 1: SLP Short Term Goal 1 (Week 1): Patient will initiate functional tasks with Min A verbal and tactile cues.  SLP Short Term Goal 2 (Week 1): Patient will demonstrate functional problem solving for basic and familiar tasks with Min A verbal cues.  SLP Short Term Goal 3 (Week 1): Patient will demonstrate selective attention in a mildly distracting enviornment for 10 minutes with Min A verbal cues for redirection.  SLP Short Term Goal 4 (Week 1): Patient will self-monitor and correct errors with functional tasks with Min A question and verbal cues. SLP Short Term Goal 5 (Week 1): Patient will utilize external aids to recall new, daily information with Min A multimodal cues. SLP Short Term Goal 6 (Week 1): Patient will utilize an increased vocal intensity to maximize intelligigibility to ~90% at the sentence level with Min A verbal cues.   Skilled Therapeutic Interventions: Pt seen for cognitive-linguistic session. Pt initially lethargic and required regular cueing to maintain adeqaute alertness to participate. Labeling common items 10/10. Initial 10 items on the [facility] Naming test short form completed with 8/10 acc. Automatic speech counting 1-10, 80% acc with visual cueing. Simple Y/N ?s 8/10 acc. 1-step directives 100% acc, 2-step min A to complete due to repetition of directive required. Sentence completion 3/7-- decreased accuracy as sentences became more open-ended. Responsive naming 3/5 acc. Pt demonstrated improved participation once daughter left the room. Flat affect with occasional smile. Perseveration noted.    Function:  Eating Eating                 Cognition Comprehension Comprehension assist level: Understands basic 50 - 74%  of the time/ requires cueing 25 - 49% of the time  Expression   Expression assist level: Expresses basic 25 - 49% of the time/requires cueing 50 - 75% of the time. Uses single words/gestures.  Social Interaction Social Interaction assist level: Interacts appropriately 25 - 49% of time - Needs frequent redirection.  Problem Solving Problem solving assist level: Solves basic 25 - 49% of the time - needs direction more than half the time to initiate, plan or complete simple activities  Memory Memory assist level: Recognizes or recalls 25 - 49% of the time/requires cueing 50 - 75% of the time    Pain Pain Assessment Pain Assessment: Faces Faces Pain Scale: No hurt  Therapy/Group: Individual Therapy  Vinetta Bergamo 02/15/2016, 3:03 PM

## 2016-02-15 NOTE — Progress Notes (Signed)
Patient was awake when rn came in to check during shift change. Daughter was at the bedside. She was able to swallow her liquid medication via teaspoon with moderate cues, daughter refused her colace at this time. IV Rocephin administered. Dr. Read Drivers called regarding result of urinalysis and update on patient's condition. Around 9 pm, RN asked if she wants to eat ice cream and she verbalized "yes". Patient ate about 50% of the ice cream . She took all her medicine whole with water one at a time and tolerated well. She also drank 100 cc of water and sips of Glucerna. She was continent of bladder using a bedpan.  She stated," I can't talk", "oh Lord" and "light in sink". Patient was more vocal 2 hours after shift change. Grip to her right hand was also present. Patient also tried to let her needs known. Patient made comfortable in bed and she turned to her left side. Will monitor.

## 2016-02-16 ENCOUNTER — Inpatient Hospital Stay (HOSPITAL_COMMUNITY): Payer: PPO | Admitting: Occupational Therapy

## 2016-02-16 DIAGNOSIS — E1142 Type 2 diabetes mellitus with diabetic polyneuropathy: Secondary | ICD-10-CM | POA: Diagnosis not present

## 2016-02-16 DIAGNOSIS — S069X9S Unspecified intracranial injury with loss of consciousness of unspecified duration, sequela: Secondary | ICD-10-CM | POA: Diagnosis not present

## 2016-02-16 DIAGNOSIS — I10 Essential (primary) hypertension: Secondary | ICD-10-CM | POA: Diagnosis not present

## 2016-02-16 DIAGNOSIS — G44309 Post-traumatic headache, unspecified, not intractable: Secondary | ICD-10-CM | POA: Diagnosis not present

## 2016-02-16 DIAGNOSIS — F918 Other conduct disorders: Secondary | ICD-10-CM | POA: Diagnosis not present

## 2016-02-16 LAB — GLUCOSE, CAPILLARY
Glucose-Capillary: 131 mg/dL — ABNORMAL HIGH (ref 65–99)
Glucose-Capillary: 161 mg/dL — ABNORMAL HIGH (ref 65–99)
Glucose-Capillary: 130 mg/dL — ABNORMAL HIGH (ref 65–99)
Glucose-Capillary: 148 mg/dL — ABNORMAL HIGH (ref 65–99)

## 2016-02-16 MED ORDER — DOCUSATE SODIUM 50 MG/5ML PO LIQD
100.0000 mg | Freq: Two times a day (BID) | ORAL | Status: DC
  Administered 2016-02-17: 100 mg via ORAL
  Filled 2016-02-16 (×2): qty 10

## 2016-02-16 MED ORDER — LEVETIRACETAM 100 MG/ML PO SOLN
750.0000 mg | Freq: Two times a day (BID) | ORAL | Status: DC
Start: 2016-02-16 — End: 2016-02-17
  Administered 2016-02-16 – 2016-02-17 (×2): 750 mg via ORAL
  Filled 2016-02-16 (×2): qty 10

## 2016-02-16 NOTE — Progress Notes (Signed)
[facility] PHYSICAL MEDICINE & REHABILITATION     PROGRESS NOTE    Subjective/Complaints: Brighter this am per RN, took sips of liquids with meds No pain , no SOB Aphasic but can speak in short sentences "I don't feel right"  ROS limited due to cognition/speech, aphasia  Objective: Vital Signs: Blood pressure 144/60, pulse 61, temperature 98.1 F (36.7 C), temperature source Oral, resp. rate 18, height 5\' 6"  (1.676 m), last menstrual period 03/24/2010, SpO2 97 %. Dg Chest 2 View  02/15/2016  CLINICAL DATA:  Worsening lethargy and decreased mental status. Postop from craniotomy for subdural hematoma evacuation. EXAM: CHEST  2 VIEW COMPARISON:  08/12/2015 FINDINGS: Heart size remains within normal limits. Tortuosity of thoracic aorta is stable. Both lungs are clear. No evidence of pneumothorax or pleural effusion. IMPRESSION: No active cardiopulmonary disease. Electronically Signed   By: Earle Gell M.D.   On: 02/15/2016 14:37   Ct Head Wo Contrast  02/14/2016  CLINICAL DATA:  Subdural hematoma EXAM: CT HEAD WITHOUT CONTRAST TECHNIQUE: Contiguous axial images were obtained from the base of the skull through the vertex without intravenous contrast. COMPARISON:  02/09/2016 FINDINGS: Changes of left craniotomy. Left mixed density subdural hematoma again noted, measuring 8 mm in thickness over the frontal lobe, stable. 5 mm of left-to-right midline shift. Decreasing pneumocephalus since prior study. No new hemorrhage. No hydrocephalus. IMPRESSION: Continued mixed density left subdural hematoma, essentially stable in size since prior study with stable mild left-to-right midline shift. Evolutionary changes within the subdural hematoma. Decreasing pneumocephalus. Electronically Signed   By: Rolm Baptise M.D.   On: 02/14/2016 11:02    Recent Labs  02/15/16 1351  WBC 9.3  HGB 12.8  HCT 37.9  PLT 225    Recent Labs  02/15/16 1406  NA 136  K 3.8  CL 105  GLUCOSE 132*  BUN 7  CREATININE  0.63  CALCIUM 9.8   CBG (last 3)   Recent Labs  02/15/16 1640 02/15/16 2029 02/16/16 0649  GLUCAP 123* 131* 131*    Wt Readings from Last 3 Encounters:  02/05/16 90.3 kg (199 lb 1.2 oz)  01/21/16 114.2 kg (251 lb 12.3 oz)  01/20/16 95.709 kg (211 lb)    Physical Exam:  Constitutional: She appears well-developed and well-nourished.  HENT: oral mucosa fairly moist Head: Normocephalic.  Craniotomy site clean and dry with bandage stapled to scalp Eyes: Reactive light  Neck: Normal range of motion. Neck supple. No thyromegaly present.  Cardiovascular: Normal rate and regular rhythm.  Respiratory: Effort normal and breath sounds normal. No respiratory distress.  GI: Soft. Bowel sounds are normal. She exhibits no distension.  Musculoskeletal: She exhibits no tenderness.  PROM WNL  Neurological:  Mood remains flat. Delayed processing and initiation. Speech very soft but language appears to be functional when she does initiate.Right neglect  Patient more alert at times per report Motor: appears to be 3+ in RUE, 5- in LUE , 4- BLE. Skin: Skin is warm and dry.  Psychiatric: She is flat. Cognition and memory are impaired. She expresses inappropriate judgment   Assessment/Plan: 1. Functional deficits secondary to acute on chronic SDH which require 3+ hours per day of interdisciplinary therapy in a comprehensive inpatient rehab setting. Physiatrist is providing close team supervision and 24 hour management of active medical problems listed below. Physiatrist and rehab team continue to assess barriers to discharge/monitor patient progress toward functional and medical goals.  Function:  Bathing Bathing position   Position: Sitting EOB  Bathing parts  Body parts bathed by patient: Right arm, Left arm, Chest, Abdomen Body parts bathed by helper: Front perineal area, Buttocks, Right upper leg, Left upper leg, Right lower leg, Left lower leg, Back  Bathing assist Assist Level:  Touching or steadying assistance(Pt > 75%)      Upper Body Dressing/Undressing Upper body dressing   What is the patient wearing?: Hospital gown                Upper body assist Assist Level: Touching or steadying assistance(Pt > 75%)   Set up : To obtain clothing/put away  Lower Body Dressing/Undressing Lower body dressing   What is the patient wearing?: Hospital Gown, Non-skid slipper socks           Non-skid slipper socks- Performed by helper: Don/doff right sock, Don/doff left sock                  Lower body assist Assist for lower body dressing: Touching or steadying assistance (Pt > 75%)      Toileting Toileting   Toileting steps completed by patient: Performs perineal hygiene Toileting steps completed by helper: Adjust clothing prior to toileting, Adjust clothing after toileting    Toileting assist Assist level: Touching or steadying assistance (Pt.75%)   Transfers Chair/bed transfer   Chair/bed transfer method: Stand pivot Chair/bed transfer assist level: Touching or steadying assistance (Pt > 75%) Chair/bed transfer assistive device: Armrests     Locomotion Ambulation     Max distance: 5 ft Assist level: Touching or steadying assistance (Pt > 75%)   Wheelchair          Cognition Comprehension Comprehension assist level: Understands basic 25 - 49% of the time/ requires cueing 50 - 75% of the time  Expression Expression assist level: Expresses basic 25 - 49% of the time/requires cueing 50 - 75% of the time. Uses single words/gestures.  Social Interaction Social Interaction assist level: Interacts appropriately 25 - 49% of time - Needs frequent redirection.  Problem Solving Problem solving assist level: Solves basic 25 - 49% of the time - needs direction more than half the time to initiate, plan or complete simple activities  Memory Memory assist level: Recognizes or recalls 25 - 49% of the time/requires cueing 50 - 75% of the time  Medical  Problem List and Plan: 1. 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 DVT Prophylaxis/Anticoagulation: SCDs. mobilize 3. Pain Management: Tylenol as needed  -persistent neurogenic headaches---topamax trial 25mg  qhs, will D/C secondary to sedation 4. Seizure disorder. Continue Keppra 1000 mg twice a day.  May be contributing to sedation, reduce to 750mg  BID, no seizures 5. Neuropsych: This patient is not capable of making decisions on her own behalf. 6. Skin/Wound Care: Routine skin checks 7. Fluids/Electrolytes/Nutrition: Replete K+, encourage PO, cont IVF until intake increased 8. COPD. Continue nebulizer treatment and inhalers as directed. Check oxygen saturations every shift 9. Hypertension. Lisinopril 20 mg daily, verapamil 180 mg daily at bedtime. Monitor with increased mobility 10. Diabetes mellitus with peripheral neuropathy. Hemoglobin A1c 6.7. Amaryl 4 mg daily.  -sugars 97-169 yesterday---continue to monitor 11. Hypothyroidism. Thyroid 60 mg daily at bedtime. TSH 0.285.   LOS (Days) 4 A FACE TO FACE EVALUATION WAS PERFORMED  Charlett Blake 02/16/2016 8:23 AM

## 2016-02-17 ENCOUNTER — Inpatient Hospital Stay (HOSPITAL_COMMUNITY): Payer: PPO

## 2016-02-17 ENCOUNTER — Inpatient Hospital Stay (HOSPITAL_COMMUNITY): Payer: PPO | Admitting: Anesthesiology

## 2016-02-17 ENCOUNTER — Encounter (HOSPITAL_COMMUNITY): Payer: Self-pay | Admitting: Anesthesiology

## 2016-02-17 ENCOUNTER — Inpatient Hospital Stay (HOSPITAL_COMMUNITY): Payer: PPO | Admitting: Occupational Therapy

## 2016-02-17 ENCOUNTER — Inpatient Hospital Stay: Admit: 2016-02-17 | Payer: Self-pay | Admitting: Neurological Surgery

## 2016-02-17 ENCOUNTER — Ambulatory Visit (HOSPITAL_COMMUNITY): Payer: PPO

## 2016-02-17 ENCOUNTER — Inpatient Hospital Stay (HOSPITAL_COMMUNITY): Payer: PPO | Admitting: Speech Pathology

## 2016-02-17 ENCOUNTER — Encounter (HOSPITAL_COMMUNITY)
Admission: RE | Disposition: A | Discharge status: Rehabilitation Facility | Payer: Self-pay | Source: Ambulatory Visit | Attending: Neurological Surgery

## 2016-02-17 ENCOUNTER — Inpatient Hospital Stay (HOSPITAL_COMMUNITY)
Admission: RE | Admit: 2016-02-17 | Discharge: 2016-02-26 | DRG: 025 | Disposition: A | Discharge status: Rehabilitation Facility | Payer: PPO | Source: Ambulatory Visit | Attending: Neurological Surgery | Admitting: Neurological Surgery

## 2016-02-17 DIAGNOSIS — S064X0A Epidural hemorrhage without loss of consciousness, initial encounter: Secondary | ICD-10-CM | POA: Diagnosis not present

## 2016-02-17 DIAGNOSIS — S069X0S Unspecified intracranial injury without loss of consciousness, sequela: Secondary | ICD-10-CM

## 2016-02-17 DIAGNOSIS — R4189 Other symptoms and signs involving cognitive functions and awareness: Secondary | ICD-10-CM

## 2016-02-17 DIAGNOSIS — N189 Chronic kidney disease, unspecified: Secondary | ICD-10-CM | POA: Diagnosis present

## 2016-02-17 DIAGNOSIS — J449 Chronic obstructive pulmonary disease, unspecified: Secondary | ICD-10-CM | POA: Diagnosis present

## 2016-02-17 DIAGNOSIS — E1122 Type 2 diabetes mellitus with diabetic chronic kidney disease: Secondary | ICD-10-CM | POA: Diagnosis not present

## 2016-02-17 DIAGNOSIS — R1310 Dysphagia, unspecified: Secondary | ICD-10-CM | POA: Diagnosis not present

## 2016-02-17 DIAGNOSIS — Z886 Allergy status to analgesic agent status: Secondary | ICD-10-CM | POA: Diagnosis not present

## 2016-02-17 DIAGNOSIS — R4702 Dysphasia: Secondary | ICD-10-CM | POA: Diagnosis not present

## 2016-02-17 DIAGNOSIS — R569 Unspecified convulsions: Secondary | ICD-10-CM | POA: Diagnosis present

## 2016-02-17 DIAGNOSIS — I69220 Aphasia following other nontraumatic intracranial hemorrhage: Secondary | ICD-10-CM | POA: Diagnosis not present

## 2016-02-17 DIAGNOSIS — I4510 Unspecified right bundle-branch block: Secondary | ICD-10-CM | POA: Diagnosis present

## 2016-02-17 DIAGNOSIS — H409 Unspecified glaucoma: Secondary | ICD-10-CM | POA: Diagnosis present

## 2016-02-17 DIAGNOSIS — S069XAA Unspecified intracranial injury with loss of consciousness status unknown, initial encounter: Secondary | ICD-10-CM | POA: Diagnosis present

## 2016-02-17 DIAGNOSIS — R4689 Other symptoms and signs involving appearance and behavior: Secondary | ICD-10-CM

## 2016-02-17 DIAGNOSIS — Z888 Allergy status to other drugs, medicaments and biological substances status: Secondary | ICD-10-CM

## 2016-02-17 DIAGNOSIS — Z7951 Long term (current) use of inhaled steroids: Secondary | ICD-10-CM

## 2016-02-17 DIAGNOSIS — E119 Type 2 diabetes mellitus without complications: Secondary | ICD-10-CM

## 2016-02-17 DIAGNOSIS — Z9889 Other specified postprocedural states: Secondary | ICD-10-CM | POA: Diagnosis not present

## 2016-02-17 DIAGNOSIS — E039 Hypothyroidism, unspecified: Secondary | ICD-10-CM | POA: Diagnosis not present

## 2016-02-17 DIAGNOSIS — R4182 Altered mental status, unspecified: Secondary | ICD-10-CM | POA: Insufficient documentation

## 2016-02-17 DIAGNOSIS — Z87891 Personal history of nicotine dependence: Secondary | ICD-10-CM

## 2016-02-17 DIAGNOSIS — I129 Hypertensive chronic kidney disease with stage 1 through stage 4 chronic kidney disease, or unspecified chronic kidney disease: Secondary | ICD-10-CM | POA: Diagnosis present

## 2016-02-17 DIAGNOSIS — I6203 Nontraumatic chronic subdural hemorrhage: Secondary | ICD-10-CM | POA: Diagnosis not present

## 2016-02-17 DIAGNOSIS — S069XAS Unspecified intracranial injury with loss of consciousness status unknown, sequela: Secondary | ICD-10-CM

## 2016-02-17 DIAGNOSIS — E878 Other disorders of electrolyte and fluid balance, not elsewhere classified: Secondary | ICD-10-CM | POA: Diagnosis not present

## 2016-02-17 DIAGNOSIS — F09 Unspecified mental disorder due to known physiological condition: Secondary | ICD-10-CM | POA: Diagnosis not present

## 2016-02-17 DIAGNOSIS — J9811 Atelectasis: Secondary | ICD-10-CM | POA: Diagnosis not present

## 2016-02-17 DIAGNOSIS — J438 Other emphysema: Secondary | ICD-10-CM

## 2016-02-17 DIAGNOSIS — Z9049 Acquired absence of other specified parts of digestive tract: Secondary | ICD-10-CM

## 2016-02-17 DIAGNOSIS — S069X9A Unspecified intracranial injury with loss of consciousness of unspecified duration, initial encounter: Secondary | ICD-10-CM | POA: Diagnosis present

## 2016-02-17 DIAGNOSIS — Z90710 Acquired absence of both cervix and uterus: Secondary | ICD-10-CM | POA: Diagnosis not present

## 2016-02-17 DIAGNOSIS — F068 Other specified mental disorders due to known physiological condition: Secondary | ICD-10-CM | POA: Diagnosis not present

## 2016-02-17 DIAGNOSIS — Z7984 Long term (current) use of oral hypoglycemic drugs: Secondary | ICD-10-CM

## 2016-02-17 DIAGNOSIS — H269 Unspecified cataract: Secondary | ICD-10-CM | POA: Diagnosis present

## 2016-02-17 DIAGNOSIS — E118 Type 2 diabetes mellitus with unspecified complications: Secondary | ICD-10-CM | POA: Diagnosis not present

## 2016-02-17 DIAGNOSIS — E7800 Pure hypercholesterolemia, unspecified: Secondary | ICD-10-CM | POA: Diagnosis not present

## 2016-02-17 DIAGNOSIS — N390 Urinary tract infection, site not specified: Secondary | ICD-10-CM | POA: Diagnosis not present

## 2016-02-17 DIAGNOSIS — E876 Hypokalemia: Secondary | ICD-10-CM | POA: Diagnosis not present

## 2016-02-17 DIAGNOSIS — G936 Cerebral edema: Secondary | ICD-10-CM | POA: Diagnosis present

## 2016-02-17 DIAGNOSIS — Z85528 Personal history of other malignant neoplasm of kidney: Secondary | ICD-10-CM

## 2016-02-17 DIAGNOSIS — G44309 Post-traumatic headache, unspecified, not intractable: Secondary | ICD-10-CM

## 2016-02-17 DIAGNOSIS — S065XAA Traumatic subdural hemorrhage with loss of consciousness status unknown, initial encounter: Secondary | ICD-10-CM

## 2016-02-17 DIAGNOSIS — F918 Other conduct disorders: Secondary | ICD-10-CM | POA: Diagnosis not present

## 2016-02-17 DIAGNOSIS — I493 Ventricular premature depolarization: Secondary | ICD-10-CM | POA: Diagnosis not present

## 2016-02-17 DIAGNOSIS — E1142 Type 2 diabetes mellitus with diabetic polyneuropathy: Secondary | ICD-10-CM | POA: Diagnosis not present

## 2016-02-17 DIAGNOSIS — G8110 Spastic hemiplegia affecting unspecified side: Secondary | ICD-10-CM

## 2016-02-17 DIAGNOSIS — I10 Essential (primary) hypertension: Secondary | ICD-10-CM

## 2016-02-17 DIAGNOSIS — E038 Other specified hypothyroidism: Secondary | ICD-10-CM

## 2016-02-17 DIAGNOSIS — R296 Repeated falls: Secondary | ICD-10-CM

## 2016-02-17 DIAGNOSIS — S069X9S Unspecified intracranial injury with loss of consciousness of unspecified duration, sequela: Secondary | ICD-10-CM | POA: Diagnosis not present

## 2016-02-17 DIAGNOSIS — I69251 Hemiplegia and hemiparesis following other nontraumatic intracranial hemorrhage affecting right dominant side: Secondary | ICD-10-CM

## 2016-02-17 DIAGNOSIS — J9601 Acute respiratory failure with hypoxia: Secondary | ICD-10-CM | POA: Diagnosis not present

## 2016-02-17 DIAGNOSIS — R000 Tachycardia, unspecified: Secondary | ICD-10-CM

## 2016-02-17 DIAGNOSIS — F0281 Dementia in other diseases classified elsewhere with behavioral disturbance: Secondary | ICD-10-CM

## 2016-02-17 DIAGNOSIS — Z79899 Other long term (current) drug therapy: Secondary | ICD-10-CM

## 2016-02-17 DIAGNOSIS — R4701 Aphasia: Secondary | ICD-10-CM | POA: Diagnosis present

## 2016-02-17 DIAGNOSIS — I2789 Other specified pulmonary heart diseases: Secondary | ICD-10-CM | POA: Diagnosis not present

## 2016-02-17 DIAGNOSIS — I6200 Nontraumatic subdural hemorrhage, unspecified: Secondary | ICD-10-CM | POA: Diagnosis not present

## 2016-02-17 DIAGNOSIS — R413 Other amnesia: Secondary | ICD-10-CM

## 2016-02-17 DIAGNOSIS — S065X0A Traumatic subdural hemorrhage without loss of consciousness, initial encounter: Secondary | ICD-10-CM | POA: Diagnosis not present

## 2016-02-17 DIAGNOSIS — S065X9A Traumatic subdural hemorrhage with loss of consciousness of unspecified duration, initial encounter: Secondary | ICD-10-CM | POA: Diagnosis present

## 2016-02-17 HISTORY — PX: CRANIOTOMY: SHX93

## 2016-02-17 LAB — URINE CULTURE: Culture: NO GROWTH

## 2016-02-17 LAB — GLUCOSE, CAPILLARY
Glucose-Capillary: 138 mg/dL — ABNORMAL HIGH (ref 65–99)
Glucose-Capillary: 155 mg/dL — ABNORMAL HIGH (ref 65–99)
Glucose-Capillary: 160 mg/dL — ABNORMAL HIGH (ref 65–99)

## 2016-02-17 SURGERY — CRANIOTOMY HEMATOMA EVACUATION SUBDURAL
Anesthesia: General

## 2016-02-17 SURGERY — CRANIOTOMY HEMATOMA EVACUATION SUBDURAL
Anesthesia: General | Site: Head

## 2016-02-17 MED ORDER — HYDRALAZINE HCL 20 MG/ML IJ SOLN
5.0000 mg | INTRAMUSCULAR | Status: DC | PRN
  Administered 2016-02-17: 10 mg via INTRAVENOUS
  Filled 2016-02-17: qty 1

## 2016-02-17 MED ORDER — PROMETHAZINE HCL 12.5 MG PO TABS: 12.5000 mg | ORAL_TABLET | ORAL | Status: DC | PRN

## 2016-02-17 MED ORDER — FENTANYL CITRATE (PF) 100 MCG/2ML IJ SOLN
INTRAMUSCULAR | Status: DC | PRN
  Administered 2016-02-17: 50 ug via INTRAVENOUS
  Administered 2016-02-17: 100 ug via INTRAVENOUS
  Administered 2016-02-17 (×2): 50 ug via INTRAVENOUS

## 2016-02-17 MED ORDER — SUCCINYLCHOLINE CHLORIDE 20 MG/ML IJ SOLN
INTRAMUSCULAR | Status: DC | PRN
  Administered 2016-02-17: 100 mg via INTRAVENOUS

## 2016-02-17 MED ORDER — LABETALOL HCL 5 MG/ML IV SOLN
10.0000 mg | INTRAVENOUS | Status: DC | PRN
  Administered 2016-02-17: 10 mg via INTRAVENOUS

## 2016-02-17 MED ORDER — MEPERIDINE HCL 25 MG/ML IJ SOLN: 6.2500 mg | INTRAMUSCULAR | Status: DC | PRN

## 2016-02-17 MED ORDER — DEXAMETHASONE SODIUM PHOSPHATE 10 MG/ML IJ SOLN
INTRAMUSCULAR | Status: DC | PRN
  Administered 2016-02-17: 10 mg via INTRAVENOUS

## 2016-02-17 MED ORDER — LISINOPRIL 20 MG PO TABS
20.0000 mg | ORAL_TABLET | Freq: Every day | ORAL | Status: DC
  Administered 2016-02-20 – 2016-02-25 (×6): 20 mg via ORAL
  Filled 2016-02-17 (×8): qty 1

## 2016-02-17 MED ORDER — SODIUM CHLORIDE 0.9 % IV SOLN
500.0000 mg | Freq: Once | INTRAVENOUS | Status: DC
  Administered 2016-02-17: 500 mg via INTRAVENOUS
  Filled 2016-02-17: qty 5

## 2016-02-17 MED ORDER — SUGAMMADEX SODIUM 200 MG/2ML IV SOLN
INTRAVENOUS | Status: AC
Start: 2016-02-17 — End: 2016-02-17
  Filled 2016-02-17: qty 2

## 2016-02-17 MED ORDER — FENTANYL CITRATE (PF) 250 MCG/5ML IJ SOLN
INTRAMUSCULAR | Status: AC
  Filled 2016-02-17: qty 5

## 2016-02-17 MED ORDER — PROPOFOL 10 MG/ML IV BOLUS
INTRAVENOUS | Status: AC
  Filled 2016-02-17: qty 20

## 2016-02-17 MED ORDER — SODIUM CHLORIDE 0.9 % IV SOLN
1000.0000 mg | Freq: Two times a day (BID) | INTRAVENOUS | Status: DC
  Administered 2016-02-17 – 2016-02-21 (×8): 1000 mg via INTRAVENOUS
  Filled 2016-02-17 (×9): qty 10

## 2016-02-17 MED ORDER — GLIMEPIRIDE 4 MG PO TABS
4.0000 mg | ORAL_TABLET | Freq: Every day | ORAL | Status: DC
  Administered 2016-02-20 – 2016-02-25 (×6): 4 mg via ORAL
  Filled 2016-02-17 (×9): qty 1

## 2016-02-17 MED ORDER — VERAPAMIL HCL ER 180 MG PO TBCR
180.0000 mg | EXTENDED_RELEASE_TABLET | Freq: Every day | ORAL | Status: DC
  Administered 2016-02-20 – 2016-02-21 (×2): 180 mg via ORAL
  Filled 2016-02-17 (×6): qty 1

## 2016-02-17 MED ORDER — PROPOFOL 10 MG/ML IV BOLUS
INTRAVENOUS | Status: DC | PRN
  Administered 2016-02-17 (×2): 20 mg via INTRAVENOUS
  Administered 2016-02-17 (×2): 50 mg via INTRAVENOUS
  Administered 2016-02-17: 30 mg via INTRAVENOUS
  Administered 2016-02-17: 50 mg via INTRAVENOUS
  Administered 2016-02-17: 100 mg via INTRAVENOUS
  Administered 2016-02-17 (×2): 20 mg via INTRAVENOUS

## 2016-02-17 MED ORDER — SODIUM CHLORIDE 0.9 % IR SOLN
Status: DC | PRN
  Administered 2016-02-17: 500 mL

## 2016-02-17 MED ORDER — EPHEDRINE SULFATE 50 MG/ML IJ SOLN
INTRAMUSCULAR | Status: AC
  Filled 2016-02-17: qty 1

## 2016-02-17 MED ORDER — SUCCINYLCHOLINE CHLORIDE 20 MG/ML IJ SOLN
INTRAMUSCULAR | Status: AC
  Filled 2016-02-17: qty 1

## 2016-02-17 MED ORDER — PANTOPRAZOLE SODIUM 40 MG IV SOLR
40.0000 mg | Freq: Every day | INTRAVENOUS | Status: DC
  Administered 2016-02-17 – 2016-02-24 (×8): 40 mg via INTRAVENOUS
  Filled 2016-02-17 (×8): qty 40

## 2016-02-17 MED ORDER — ARTIFICIAL TEARS OP OINT
TOPICAL_OINTMENT | OPHTHALMIC | Status: AC
  Filled 2016-02-17: qty 3.5

## 2016-02-17 MED ORDER — GELATIN ABSORBABLE MT POWD
OROMUCOSAL | Status: DC | PRN
  Administered 2016-02-17: 15:00:00 via TOPICAL
  Administered 2016-02-17: 5 mL via TOPICAL

## 2016-02-17 MED ORDER — SENNA 8.6 MG PO TABS
1.0000 | ORAL_TABLET | Freq: Two times a day (BID) | ORAL | Status: DC
  Administered 2016-02-20 – 2016-02-25 (×10): 8.6 mg via ORAL
  Filled 2016-02-17 (×12): qty 1

## 2016-02-17 MED ORDER — CEFAZOLIN SODIUM-DEXTROSE 2-3 GM-% IV SOLR
INTRAVENOUS | Status: DC | PRN
  Administered 2016-02-17: 2 g via INTRAVENOUS

## 2016-02-17 MED ORDER — LIDOCAINE HCL (CARDIAC) 20 MG/ML IV SOLN
INTRAVENOUS | Status: AC
  Filled 2016-02-17: qty 5

## 2016-02-17 MED ORDER — METOCLOPRAMIDE HCL 5 MG/ML IJ SOLN
INTRAMUSCULAR | Status: AC
  Filled 2016-02-17: qty 2

## 2016-02-17 MED ORDER — FLEET ENEMA 7-19 GM/118ML RE ENEM: 1.0000 | ENEMA | Freq: Once | RECTAL | Status: DC | PRN

## 2016-02-17 MED ORDER — MOMETASONE FURO-FORMOTEROL FUM 200-5 MCG/ACT IN AERO
2.0000 | INHALATION_SPRAY | Freq: Two times a day (BID) | RESPIRATORY_TRACT | Status: DC
  Administered 2016-02-18 – 2016-02-25 (×13): 2 via RESPIRATORY_TRACT
  Filled 2016-02-17 (×2): qty 8.8

## 2016-02-17 MED ORDER — NALOXONE HCL 0.4 MG/ML IJ SOLN: 0.0800 mg | INTRAMUSCULAR | Status: DC | PRN

## 2016-02-17 MED ORDER — STERILE WATER FOR INJECTION IJ SOLN
INTRAMUSCULAR | Status: AC
  Filled 2016-02-17: qty 20

## 2016-02-17 MED ORDER — SURGIFOAM 100 EX MISC
CUTANEOUS | Status: DC | PRN
  Administered 2016-02-17: 20 mL via TOPICAL

## 2016-02-17 MED ORDER — SODIUM CHLORIDE 0.9 % IV SOLN
INTRAVENOUS | Status: DC | PRN
  Administered 2016-02-17: 14:00:00 via INTRAVENOUS

## 2016-02-17 MED ORDER — 0.9 % SODIUM CHLORIDE (POUR BTL) OPTIME
TOPICAL | Status: DC | PRN
  Administered 2016-02-17 (×3): 1000 mL

## 2016-02-17 MED ORDER — DEXAMETHASONE SODIUM PHOSPHATE 10 MG/ML IJ SOLN
INTRAMUSCULAR | Status: AC
  Filled 2016-02-17: qty 1

## 2016-02-17 MED ORDER — ROCURONIUM BROMIDE 50 MG/5ML IV SOLN
INTRAVENOUS | Status: AC
  Filled 2016-02-17: qty 1

## 2016-02-17 MED ORDER — LABETALOL HCL 5 MG/ML IV SOLN
INTRAVENOUS | Status: DC | PRN
  Administered 2016-02-17: 2.5 mg via INTRAVENOUS

## 2016-02-17 MED ORDER — CEFAZOLIN SODIUM-DEXTROSE 2-4 GM/100ML-% IV SOLN
2.0000 g | Freq: Three times a day (TID) | INTRAVENOUS | Status: AC
  Administered 2016-02-17 – 2016-02-18 (×2): 2 g via INTRAVENOUS
  Filled 2016-02-17 (×2): qty 100

## 2016-02-17 MED ORDER — ONDANSETRON HCL 4 MG/2ML IJ SOLN
4.0000 mg | INTRAMUSCULAR | Status: DC | PRN
  Administered 2016-02-17 – 2016-02-26 (×3): 4 mg via INTRAVENOUS
  Filled 2016-02-17 (×3): qty 2

## 2016-02-17 MED ORDER — HYDROMORPHONE HCL 1 MG/ML IJ SOLN
0.5000 mg | INTRAMUSCULAR | Status: DC | PRN
  Administered 2016-02-17 – 2016-02-18 (×4): 0.5 mg via INTRAVENOUS
  Filled 2016-02-17 (×4): qty 1

## 2016-02-17 MED ORDER — SUGAMMADEX SODIUM 200 MG/2ML IV SOLN
INTRAVENOUS | Status: DC | PRN
  Administered 2016-02-17: 200 mg via INTRAVENOUS

## 2016-02-17 MED ORDER — METOCLOPRAMIDE HCL 5 MG/ML IJ SOLN
10.0000 mg | Freq: Once | INTRAMUSCULAR | Status: AC | PRN
  Administered 2016-02-17: 10 mg via INTRAVENOUS

## 2016-02-17 MED ORDER — LACTATED RINGERS IV SOLN
INTRAVENOUS | Status: DC | PRN
  Administered 2016-02-17: 14:00:00 via INTRAVENOUS

## 2016-02-17 MED ORDER — INSULIN ASPART 100 UNIT/ML ~~LOC~~ SOLN
0.0000 [IU] | Freq: Four times a day (QID) | SUBCUTANEOUS | Status: DC
  Administered 2016-02-17: 8 [IU] via SUBCUTANEOUS
  Administered 2016-02-18: 2 [IU] via SUBCUTANEOUS
  Administered 2016-02-18: 3 [IU] via SUBCUTANEOUS
  Administered 2016-02-19 (×2): 2 [IU] via SUBCUTANEOUS
  Administered 2016-02-19: 3 [IU] via SUBCUTANEOUS
  Administered 2016-02-20 (×2): 2 [IU] via SUBCUTANEOUS
  Administered 2016-02-21: 3 [IU] via SUBCUTANEOUS
  Administered 2016-02-21: 2 [IU] via SUBCUTANEOUS
  Administered 2016-02-21: 3 [IU] via SUBCUTANEOUS
  Administered 2016-02-21: 2 [IU] via SUBCUTANEOUS
  Administered 2016-02-21: 3 [IU] via SUBCUTANEOUS
  Administered 2016-02-22: 2 [IU] via SUBCUTANEOUS

## 2016-02-17 MED ORDER — BUPIVACAINE-EPINEPHRINE (PF) 0.5% -1:200000 IJ SOLN
INTRAMUSCULAR | Status: DC | PRN
  Administered 2016-02-17: 15 mL

## 2016-02-17 MED ORDER — THEOPHYLLINE ER 400 MG PO TB24
400.0000 mg | ORAL_TABLET | Freq: Every day | ORAL | Status: DC
  Filled 2016-02-17 (×3): qty 1

## 2016-02-17 MED ORDER — THYROID 60 MG PO TABS
60.0000 mg | ORAL_TABLET | Freq: Every day | ORAL | Status: DC
  Administered 2016-02-20 – 2016-02-25 (×6): 60 mg via ORAL
  Filled 2016-02-17 (×9): qty 1

## 2016-02-17 MED ORDER — LIDOCAINE-EPINEPHRINE 2 %-1:100000 IJ SOLN
INTRAMUSCULAR | Status: DC | PRN
  Administered 2016-02-17: 15 mL

## 2016-02-17 MED ORDER — ONDANSETRON HCL 4 MG/2ML IJ SOLN
INTRAMUSCULAR | Status: DC | PRN
  Administered 2016-02-17: 4 mg via INTRAVENOUS

## 2016-02-17 MED ORDER — SODIUM CHLORIDE 0.9 % IV SOLN
INTRAVENOUS | Status: DC
  Administered 2016-02-17 – 2016-02-19 (×4): via INTRAVENOUS

## 2016-02-17 MED ORDER — FENTANYL CITRATE (PF) 100 MCG/2ML IJ SOLN
25.0000 ug | INTRAMUSCULAR | Status: DC | PRN
Start: 2016-02-17 — End: 2016-02-17
  Administered 2016-02-17: 25 ug via INTRAVENOUS

## 2016-02-17 MED ORDER — ESMOLOL HCL 100 MG/10ML IV SOLN
INTRAVENOUS | Status: DC | PRN
  Administered 2016-02-17 (×3): 30 mg via INTRAVENOUS

## 2016-02-17 MED ORDER — HYDRALAZINE HCL 20 MG/ML IJ SOLN
10.0000 mg | INTRAMUSCULAR | Status: DC | PRN
  Administered 2016-02-17: 10 mg via INTRAVENOUS
  Administered 2016-02-17 – 2016-02-19 (×10): 20 mg via INTRAVENOUS
  Administered 2016-02-19: 30 mg via INTRAVENOUS
  Administered 2016-02-22 – 2016-02-25 (×2): 20 mg via INTRAVENOUS
  Filled 2016-02-17 (×3): qty 1
  Filled 2016-02-17: qty 2
  Filled 2016-02-17 (×6): qty 1
  Filled 2016-02-17: qty 2
  Filled 2016-02-17: qty 1

## 2016-02-17 MED ORDER — ROCURONIUM BROMIDE 100 MG/10ML IV SOLN
INTRAVENOUS | Status: DC | PRN
  Administered 2016-02-17 (×2): 10 mg via INTRAVENOUS
  Administered 2016-02-17: 30 mg via INTRAVENOUS

## 2016-02-17 MED ORDER — POLYETHYLENE GLYCOL 3350 17 G PO PACK: 17.0000 g | PACK | Freq: Every day | ORAL | Status: DC | PRN

## 2016-02-17 MED ORDER — PHENYLEPHRINE HCL 10 MG/ML IJ SOLN
INTRAMUSCULAR | Status: AC
  Filled 2016-02-17: qty 2

## 2016-02-17 MED ORDER — LEVETIRACETAM 500 MG PO TABS: 1000.0000 mg | ORAL_TABLET | Freq: Two times a day (BID) | ORAL | Status: DC

## 2016-02-17 MED ORDER — SODIUM CHLORIDE 0.9 % IJ SOLN
INTRAMUSCULAR | Status: AC
  Filled 2016-02-17: qty 10

## 2016-02-17 MED ORDER — DOCUSATE SODIUM 100 MG PO CAPS
100.0000 mg | ORAL_CAPSULE | Freq: Two times a day (BID) | ORAL | Status: DC
  Administered 2016-02-20 – 2016-02-21 (×3): 100 mg via ORAL
  Filled 2016-02-17 (×6): qty 1

## 2016-02-17 MED ORDER — ARTIFICIAL TEARS OP OINT
TOPICAL_OINTMENT | OPHTHALMIC | Status: DC | PRN
  Administered 2016-02-17: 1 via OPHTHALMIC

## 2016-02-17 MED ORDER — LATANOPROST 0.005 % OP SOLN
1.0000 [drp] | Freq: Every day | OPHTHALMIC | Status: DC
  Administered 2016-02-17 – 2016-02-25 (×9): 1 [drp] via OPHTHALMIC
  Filled 2016-02-17: qty 2.5

## 2016-02-17 MED ORDER — LIDOCAINE HCL (CARDIAC) 20 MG/ML IV SOLN
INTRAVENOUS | Status: DC | PRN
  Administered 2016-02-17: 60 mg via INTRAVENOUS

## 2016-02-17 MED ORDER — ONDANSETRON HCL 4 MG PO TABS: 4.0000 mg | ORAL_TABLET | ORAL | Status: DC | PRN

## 2016-02-17 MED ORDER — BACITRACIN ZINC 500 UNIT/GM EX OINT
TOPICAL_OINTMENT | CUTANEOUS | Status: DC | PRN
  Administered 2016-02-17: 1 via TOPICAL

## 2016-02-17 MED ORDER — ALBUTEROL SULFATE (2.5 MG/3ML) 0.083% IN NEBU: 2.5000 mg | INHALATION_SOLUTION | RESPIRATORY_TRACT | Status: DC | PRN

## 2016-02-17 MED ORDER — CEFAZOLIN SODIUM 1 G IJ SOLR
INTRAMUSCULAR | Status: AC
Start: 2016-02-17 — End: 2016-02-17
  Filled 2016-02-17: qty 20

## 2016-02-17 MED ORDER — LABETALOL HCL 5 MG/ML IV SOLN
INTRAVENOUS | Status: AC
  Filled 2016-02-17: qty 4

## 2016-02-17 MED ORDER — BISACODYL 5 MG PO TBEC: 5.0000 mg | DELAYED_RELEASE_TABLET | Freq: Every day | ORAL | Status: DC | PRN

## 2016-02-17 MED ORDER — FENTANYL CITRATE (PF) 100 MCG/2ML IJ SOLN
INTRAMUSCULAR | Status: AC
  Filled 2016-02-17: qty 2

## 2016-02-17 SURGICAL SUPPLY — 97 items
APL SKNCLS STERI-STRIP NONHPOA (GAUZE/BANDAGES/DRESSINGS)
APPLICATOR CHLORAPREP 3ML ORNG (MISCELLANEOUS) ×1 IMPLANT
BATTERY IQ STERILE (MISCELLANEOUS) ×3 IMPLANT
BENZOIN TINCTURE PRP APPL 2/3 (GAUZE/BANDAGES/DRESSINGS) IMPLANT
BLADE 11 SAFETY STRL DISP (BLADE) ×3 IMPLANT
BLADE CLIPPER SURG (BLADE) ×3 IMPLANT
BLADE ULTRA TIP 2M (BLADE) ×1 IMPLANT
BNDG GAUZE ELAST 4 BULKY (GAUZE/BANDAGES/DRESSINGS) IMPLANT
BRUSH SCRUB EZ 1% IODOPHOR (MISCELLANEOUS) ×5 IMPLANT
BTRY SRG DRVR 1.5 IQ (MISCELLANEOUS) ×1
BUR ACORN 6.0 PRECISION (BURR) ×2 IMPLANT
BUR ACORN 6.0MM PRECISION (BURR) ×1
BUR ADDG 1.1 (BURR) IMPLANT
BUR ADDG 1.1MM (BURR)
BUR MATCHSTICK NEURO 3.0 LAGG (BURR) IMPLANT
BUR SPIRAL ROUTER 2.3 (BUR) ×2 IMPLANT
BUR SPIRAL ROUTER 2.3MM (BUR) ×1
CANISTER SUCT 3000ML PPV (MISCELLANEOUS) ×6 IMPLANT
CATH ROBINSON RED A/P 14FR (CATHETERS) IMPLANT
CLIP TI MEDIUM 6 (CLIP) IMPLANT
DRAIN SNY WOU 7FLT (WOUND CARE) IMPLANT
DRAPE NEUROLOGICAL W/INCISE (DRAPES) ×3 IMPLANT
DRAPE SHEET LG 3/4 BI-LAMINATE (DRAPES) ×3 IMPLANT
DRAPE SURG 17X23 STRL (DRAPES) IMPLANT
DRAPE WARM FLUID 44X44 (DRAPE) ×3 IMPLANT
DRSG TELFA 3X8 NADH (GAUZE/BANDAGES/DRESSINGS) ×3 IMPLANT
ELECT REM PT RETURN 9FT ADLT (ELECTROSURGICAL) ×3
ELECTRODE REM PT RTRN 9FT ADLT (ELECTROSURGICAL) ×1 IMPLANT
EVACUATOR 1/8 PVC DRAIN (DRAIN) IMPLANT
EVACUATOR SILICONE 100CC (DRAIN) IMPLANT
GAUZE SPONGE 4X4 12PLY STRL (GAUZE/BANDAGES/DRESSINGS) ×1 IMPLANT
GAUZE SPONGE 4X4 16PLY XRAY LF (GAUZE/BANDAGES/DRESSINGS) IMPLANT
GLOVE BIO SURGEON STRL SZ 6.5 (GLOVE) ×1 IMPLANT
GLOVE BIO SURGEONS STRL SZ 6.5 (GLOVE) ×1
GLOVE BIOGEL PI IND STRL 6.5 (GLOVE) IMPLANT
GLOVE BIOGEL PI IND STRL 7.0 (GLOVE) IMPLANT
GLOVE BIOGEL PI IND STRL 7.5 (GLOVE) ×1 IMPLANT
GLOVE BIOGEL PI INDICATOR 6.5 (GLOVE) ×2
GLOVE BIOGEL PI INDICATOR 7.0 (GLOVE) ×4
GLOVE BIOGEL PI INDICATOR 7.5 (GLOVE) ×2
GLOVE ECLIPSE 9.0 STRL (GLOVE) ×2 IMPLANT
GLOVE EXAM NITRILE LRG STRL (GLOVE) ×3 IMPLANT
GLOVE EXAM NITRILE MD LF STRL (GLOVE) IMPLANT
GLOVE EXAM NITRILE XL STR (GLOVE) IMPLANT
GLOVE EXAM NITRILE XS STR PU (GLOVE) IMPLANT
GLOVE SS BIOGEL STRL SZ 7 (GLOVE) ×2 IMPLANT
GLOVE SUPERSENSE BIOGEL SZ 7 (GLOVE) ×4
GLOVE SURG SS PI 6.5 STRL IVOR (GLOVE) ×6 IMPLANT
GOWN STRL REUS W/ TWL LRG LVL3 (GOWN DISPOSABLE) ×1 IMPLANT
GOWN STRL REUS W/ TWL XL LVL3 (GOWN DISPOSABLE) ×1 IMPLANT
GOWN STRL REUS W/TWL 2XL LVL3 (GOWN DISPOSABLE) IMPLANT
GOWN STRL REUS W/TWL LRG LVL3 (GOWN DISPOSABLE) ×12
GOWN STRL REUS W/TWL XL LVL3 (GOWN DISPOSABLE) ×3
HEMOSTAT POWDER KIT SURGIFOAM (HEMOSTASIS) ×3 IMPLANT
HEMOSTAT SURGICEL 2X14 (HEMOSTASIS) IMPLANT
KIT BASIN OR (CUSTOM PROCEDURE TRAY) ×3 IMPLANT
KIT ROOM TURNOVER OR (KITS) ×3 IMPLANT
NDL HYPO 21X1.5 SAFETY (NEEDLE) ×1 IMPLANT
NDL HYPO 25X1 1.5 SAFETY (NEEDLE) ×1 IMPLANT
NEEDLE HYPO 21X1.5 SAFETY (NEEDLE) ×3 IMPLANT
NEEDLE HYPO 25X1 1.5 SAFETY (NEEDLE) IMPLANT
NS IRRIG 1000ML POUR BTL (IV SOLUTION) ×8 IMPLANT
PACK CRANIOTOMY (CUSTOM PROCEDURE TRAY) ×3 IMPLANT
PAD DRESSING TELFA 3X8 NADH (GAUZE/BANDAGES/DRESSINGS) IMPLANT
PATTIES SURGICAL .5 X.5 (GAUZE/BANDAGES/DRESSINGS) IMPLANT
PATTIES SURGICAL .5 X3 (DISPOSABLE) IMPLANT
PATTIES SURGICAL .5X1.5 (GAUZE/BANDAGES/DRESSINGS) IMPLANT
PATTIES SURGICAL 1X1 (DISPOSABLE) IMPLANT
PIN MAYFIELD SKULL DISP (PIN) ×3 IMPLANT
PLATE 1.5  2HOLE LNG NEURO (Plate) ×4 IMPLANT
PLATE 1.5 2HOLE LNG NEURO (Plate) IMPLANT
SCREW SELF DRILL HT 1.5/4MM (Screw) ×10 IMPLANT
SPONGE NEURO XRAY DETECT 1X3 (DISPOSABLE) IMPLANT
SPONGE SURGIFOAM ABS GEL 100 (HEMOSTASIS) ×3 IMPLANT
SPONGE SURGIFOAM ABS GEL 100C (HEMOSTASIS) ×3 IMPLANT
STAPLER VISISTAT 35W (STAPLE) ×3 IMPLANT
STOCKINETTE 6  STRL (DRAPES)
STOCKINETTE 6 STRL (DRAPES) ×1 IMPLANT
STRIP SURGICAL 1 X 6 IN (GAUZE/BANDAGES/DRESSINGS) IMPLANT
STRIP SURGICAL 1/2 X 6 IN (GAUZE/BANDAGES/DRESSINGS) IMPLANT
STRIP SURGICAL 1/4 X 6 IN (GAUZE/BANDAGES/DRESSINGS) IMPLANT
STRIP SURGICAL 3/4 X 6 IN (GAUZE/BANDAGES/DRESSINGS) IMPLANT
SUT ETHILON 3 0 FSL (SUTURE) IMPLANT
SUT ETHILON 3 0 PS 1 (SUTURE) IMPLANT
SUT MNCRL AB 3-0 PS2 18 (SUTURE) ×4 IMPLANT
SUT NURALON 4 0 TR CR/8 (SUTURE) ×4 IMPLANT
SUT VIC AB 0 CT1 18XCR BRD8 (SUTURE) ×2 IMPLANT
SUT VIC AB 0 CT1 8-18 (SUTURE) ×6
SUT VIC AB 2-0 CT1 18 (SUTURE) ×10 IMPLANT
SYR 30ML LL (SYRINGE) ×6 IMPLANT
TOWEL OR 17X24 6PK STRL BLUE (TOWEL DISPOSABLE) ×3 IMPLANT
TOWEL OR 17X26 10 PK STRL BLUE (TOWEL DISPOSABLE) ×3 IMPLANT
TRAY FOLEY W/METER SILVER 14FR (SET/KITS/TRAYS/PACK) ×3 IMPLANT
TUBE CONNECTING 12'X1/4 (SUCTIONS) ×1
TUBE CONNECTING 12X1/4 (SUCTIONS) ×2 IMPLANT
UNDERPAD 30X30 INCONTINENT (UNDERPADS AND DIAPERS) ×1 IMPLANT
WATER STERILE IRR 1000ML POUR (IV SOLUTION) ×3 IMPLANT

## 2016-02-17 NOTE — Progress Notes (Signed)
Patient discharged to the [facility].

## 2016-02-17 NOTE — Progress Notes (Signed)
[facility] PHYSICAL MEDICINE & REHABILITATION     PROGRESS NOTE    Subjective/Complaints: Much more alert. Denies headache. Feels better this morning. Daughter at bedside. States that she is not an early morning person.  ROS limited due to cognition/speech, aphasia  Objective: Vital Signs: Blood pressure 131/64, pulse 68, temperature 98.6 F (37 C), temperature source Oral, resp. rate 18, height 5\' 6"  (1.676 m), last menstrual period 03/24/2010, SpO2 97 %. Dg Chest 2 View  02/15/2016  CLINICAL DATA:  Worsening lethargy and decreased mental status. Postop from craniotomy for subdural hematoma evacuation. EXAM: CHEST  2 VIEW COMPARISON:  08/12/2015 FINDINGS: Heart size remains within normal limits. Tortuosity of thoracic aorta is stable. Both lungs are clear. No evidence of pneumothorax or pleural effusion. IMPRESSION: No active cardiopulmonary disease. Electronically Signed   By: Earle Gell M.D.   On: 02/15/2016 14:37    Recent Labs  02/15/16 1351  WBC 9.3  HGB 12.8  HCT 37.9  PLT 225    Recent Labs  02/15/16 1406  NA 136  K 3.8  CL 105  GLUCOSE 132*  BUN 7  CREATININE 0.63  CALCIUM 9.8   CBG (last 3)   Recent Labs  02/16/16 1627 02/16/16 2107 02/17/16 0649  GLUCAP 130* 148* 138*    Wt Readings from Last 3 Encounters:  02/05/16 90.3 kg (199 lb 1.2 oz)  01/21/16 114.2 kg (251 lb 12.3 oz)  01/20/16 95.709 kg (211 lb)    Physical Exam:  Constitutional: She appears well-developed and well-nourished.  HENT: oral mucosa fairly moist Head: Normocephalic.  Craniotomy site clean and dry with bandage stapled to scalp Eyes: Reactive light  Neck: Normal range of motion. Neck supple. No thyromegaly present.  Cardiovascular: Normal rate and regular rhythm.  Respiratory: Effort normal and breath sounds normal. No respiratory distress.  GI: Soft. Bowel sounds are normal. She exhibits no distension.  Musculoskeletal: She exhibits no tenderness.  PROM WNL   Neurological:  Mood remains flat but more alert. Delayed processing and initiation. Speech very soft but language appears to be functional when she does initiate.Right neglect  Motor: appears to be 3+ in RUE, 5- in LUE , 4- BLE. Skin: Skin is warm and dry.  Psychiatric: She is flat. Cognition and memory are impaired. She expresses inappropriate judgment   Assessment/Plan: 1. Functional deficits secondary to acute on chronic SDH which require 3+ hours per day of interdisciplinary therapy in a comprehensive inpatient rehab setting. Physiatrist is providing close team supervision and 24 hour management of active medical problems listed below. Physiatrist and rehab team continue to assess barriers to discharge/monitor patient progress toward functional and medical goals.  Function:  Bathing Bathing position   Position: Sitting EOB  Bathing parts Body parts bathed by patient: Right arm, Left arm, Chest, Abdomen Body parts bathed by helper: Front perineal area, Buttocks, Right upper leg, Left upper leg, Right lower leg, Left lower leg, Back  Bathing assist Assist Level: Touching or steadying assistance(Pt > 75%)      Upper Body Dressing/Undressing Upper body dressing   What is the patient wearing?: Hospital gown                Upper body assist Assist Level: Touching or steadying assistance(Pt > 75%)   Set up : To obtain clothing/put away  Lower Body Dressing/Undressing Lower body dressing   What is the patient wearing?: Hospital Gown, Non-skid slipper socks           Non-skid slipper  socks- Performed by helper: Don/doff right sock, Don/doff left sock                  Lower body assist Assist for lower body dressing: Touching or steadying assistance (Pt > 75%)      Toileting Toileting   Toileting steps completed by patient: Performs perineal hygiene Toileting steps completed by helper: Adjust clothing prior to toileting, Performs perineal hygiene, Adjust clothing  after toileting    Toileting assist Assist level: Touching or steadying assistance (Pt.75%)   Transfers Chair/bed transfer   Chair/bed transfer method: Stand pivot Chair/bed transfer assist level: Touching or steadying assistance (Pt > 75%) Chair/bed transfer assistive device: Armrests     Locomotion Ambulation     Max distance: 5 ft Assist level: Touching or steadying assistance (Pt > 75%)   Wheelchair          Cognition Comprehension Comprehension assist level: Understands basic 25 - 49% of the time/ requires cueing 50 - 75% of the time  Expression Expression assist level: Expresses basic 25 - 49% of the time/requires cueing 50 - 75% of the time. Uses single words/gestures.  Social Interaction Social Interaction assist level: Interacts appropriately 25 - 49% of time - Needs frequent redirection.  Problem Solving Problem solving assist level: Solves basic 25 - 49% of the time - needs direction more than half the time to initiate, plan or complete simple activities  Memory Memory assist level: Recognizes or recalls 25 - 49% of the time/requires cueing 50 - 75% of the time  Medical Problem List and Plan: 1. 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 DVT Prophylaxis/Anticoagulation: SCDs. mobilize 3. Pain Management: Tylenol as needed  -headaches better. Will have to be conservative with medications due to poor tolerance 4. Seizure disorder. Continue Keppra 750mg  BID, no seizures 5. Neuropsych: This patient is not capable of making decisions on her own behalf. 6. Skin/Wound Care: Routine skin checks 7. Fluids/Electrolytes/Nutrition: Replete K+, encourage PO, cont IVF until intake increased  -recheck labs  tomorrow 8. COPD. Continue nebulizer treatment and inhalers as directed. Check oxygen saturations every shift 9. Hypertension. Lisinopril 20 mg daily, verapamil 180 mg daily at bedtime. Monitor with increased mobility 10. Diabetes mellitus with peripheral neuropathy. Hemoglobin A1c 6.7. Amaryl 4 mg daily.  -sugars stable 11. Hypothyroidism. Thyroid 60 mg daily at bedtime. TSH 0.285.   LOS (Days) 5 A FACE TO FACE EVALUATION WAS PERFORMED  Carolyn [name] T 02/17/2016 8:54 AM

## 2016-02-17 NOTE — Op Note (Signed)
02/17/2016  4:04 PM  PATIENT:  Carolyn [name]  82 y.o. female  PRE-OPERATIVE DIAGNOSIS:  Residual left-sided subdural hematoma  POST-OPERATIVE DIAGNOSIS:  Same  PROCEDURE:  Reentry left craniotomy for evacuation of subdural hematoma  SURGEON:  Aldean Ast, MD  ASSISTANTS: Earnie Larsson, M.D.  ANESTHESIA:   General   DRAINS: None  SPECIMEN:  None  INDICATION FOR PROCEDURE: 82 year old female approximately 1 week status post left craniotomy for evacuation of subdural hematoma. She has been in rehabilitation. She had a neurological decline over the weekend. CT of her head revealed that she had residual frontal and parieto-occipital subdural hematomas. I recommended reentry craniotomy. Patient understood the risks, benefits, and alternatives and potential outcomes and wished to proceed.  PROCEDURE DETAILS: After smooth induction of general endotracheal anesthesia the patient was placed in [place]field pins and fixated bed with head turned the right. The skin was prepped and draped in the usual sterile fashion. I injected lidocaine and Marcaine with epi over the area of the planned new incision. I reopened the existing incision. I then extended this slightly frontally and then in the temporal direction from the posterior limit of the incision. Raney clips were placed on the skin edges.  The temporalis muscle was sharply incised.  A musculocutaneous flap was then elevated and reflected anteriorly.  The prior craniotomy flap was removed. I then separated the dura from the skull anterior and inferior to posterior and inferior from the existing craniotomy. I created and enlarged craniotomy inferiorly. I opened the dura in a linear fashion. I found chronic subdural anteriorly and posteriorly. This was very thick. I removed this with suction aspiration. I then irrigated vigorously. I found one attached dural bridging vein. This was coagulated and sharply divided. I depress the brain with malleable  brain retractors to ensure that there was no further subdural hematoma that needed to be evacuated. There was excellent hemostasis. I irrigated with bacitracin saline. I closed the dura with interrupted sutures.  The craniotomy flaps were joined with titanium fixation plates and then inserted and fixated to the skull. I irrigated vigorously again. The wound was then closed in routine anatomic layers. The skin was closed with a running Monocryl suture. A sterile dressing was applied. The patient was removed from Mayfield pins and allowed to waken from anesthesia.  PATIENT DISPOSITION:  ICU - extubated and stable.   Delay start of Pharmacological VTE agent (>24hrs) due to surgical blood loss or risk of bleeding:  yes

## 2016-02-17 NOTE — Progress Notes (Signed)
eLink Physician-Brief Progress Note Patient Name: Carolyn [name] DOB: 10-02-1933 MRN: [id number]   Date of Service  02/17/2016  HPI/Events of Note  Repeat craniotomy for residual SDH hypertensive  eICU Interventions  Treat pain hydrallazine prn     Intervention Category Evaluation Type: New Patient Evaluation  Riot Waterworth V. 02/17/2016, 7:10 PM

## 2016-02-17 NOTE — Discharge Summary (Signed)
Discharge summary job # [id number]

## 2016-02-17 NOTE — Transfer of Care (Signed)
Immediate Anesthesia Transfer of Care Note  Patient: Carolyn [name]  Procedure(s) Performed: Procedure(s) with comments: CRANIOTOMY HEMATOMA EVACUATION SUBDURAL (N/A) - left  Patient Location: PACU  Anesthesia Type:General  Level of Consciousness: responds to stimulation  Airway & Oxygen Therapy: Patient Spontanous Breathing and Patient connected to face mask oxygen  Post-op Assessment: Report given to RN will move left side to command but weak on right.  Still drowsy and reluctant to open eyes   Post vital signs: Reviewed and stable  Last Vitals: There were no vitals filed for this visit.  Complications: No apparent anesthesia complications

## 2016-02-17 NOTE — Progress Notes (Signed)
SLP Cancellation Note  Patient Details Name: Carolyn [name] MRN: [id number] DOB: 02/11/1933   Cancelled treatment: Patient missed 60 minutes of skilled SLP intervention due to not felling well (patient with nausea and vomiting this morning) and fatigue. RN and PA made aware. Patient's daughter present and educated on patient's current cognitive-linguistic goals. Patient left supine in bed with all needs within reach and family present. Continue with current plan of care.                                                                                                Kaitlinn Iversen, [place] 02/17/2016, 10:49 AM

## 2016-02-17 NOTE — Progress Notes (Signed)
Patient declined neurologically over the weekend.  Obtained CT today which shows left frontal subdural fluid collection is still causing mass effect.  Subdural must be more loculated than was apparent in OR or on pre-op imaging.  Discussed with daughter and rehab team.  Will take back to the OR for redo.

## 2016-02-17 NOTE — Progress Notes (Signed)
Pt transferred to acute from CIR.  Had only just begun CIR program late last week and no d/c planning had yet taken place.  CIR goals had been set for supervision overall and daughter, [name], was to be primary caregiver in the home.  Will monitor case and reassess if she is to return to CIR.  Shriley Joffe, LCSW

## 2016-02-17 NOTE — H&P (Signed)
Update from H&P performed in the past thirty days.  Patient experienced neurological decline over the weekend after undergoing evacuation of chronic subdural hematoma.  CT reveals focal areas of residual subdural that likely represent loculation.  Will re-open and explore.  Awake and alert, confused Follows commands bilaterally

## 2016-02-17 NOTE — Plan of Care (Signed)
Problem: RH SKIN INTEGRITY Goal: RH STG ABLE TO PERFORM INCISION/WOUND CARE W/ASSISTANCE STG Able To Perform Incision/Wound Care With Mod Assistance.  Outcome: Not Applicable Date Met:  14/43/60 No dressing change at this time

## 2016-02-17 NOTE — Progress Notes (Signed)
Pt took meds crushed with applesauce with several attempts to swallow, and max cues.

## 2016-02-17 NOTE — Progress Notes (Signed)
Occupational Therapy Session Note  Patient Details  Name: Carolyn [name] MRN: [id number] Date of Birth: Dec 30, 1932  Today's Date: 02/17/2016 OT Individual Time:  -   0900-0915  (15 min); pt missed 45 min       Short Term Goals: Week 1:  OT Short Term Goal 1 (Week 1): Pt will transfer to toilet with supervision  OT Short Term Goal 2 (Week 1): Pt will perform 3/3 toileting steps with mod A  OT Short Term Goal 3 (Week 1): Pt will perform LB dressing (underwear, pants, socks and shoes ) with mod A OT Short Term Goal 4 (Week 1): Pt will stand to complete 2 grooming tasks with steadying A with one UE support before needing to sit OT Short Term Goal 5 (Week 1): Pt will initiate tasks with min prompting in a 60 min session  Week 2:     Skilled Therapeutic Interventions/Progress Updates:    Pt long sitting in bed this am with daughter assisting her with episode of vomiting.  Pt. Reported "no" when asked about doing any therapy.  Daughter stated that she migh feel like it later on.  Will check on pt in afternoon.  Pt. left in room with all items in reach.  2 Therapy Documentation Precautions:  Precautions Precautions: Fall Precaution Comments: Daughter reports Dr Gladstone Lighter removed previous long arm cast and gave pt a splint to wear mostly for night time use  Restrictions Weight Bearing Restrictions: No    Vital Signs: Therapy Vitals Temp: 98.6 F (37 C) Temp Source: Oral Pulse Rate: 68 Resp: 18 BP: 131/64 mmHg Patient Position (if appropriate): Lying Oxygen Therapy SpO2: 97 % O2 Device: Not Delivered Pain:  Nausea; no pain indicated            See Function Navigator for Current Functional Status.   Therapy/Group: Individual Therapy  Lisa Roca 02/17/2016, 7:51 AM

## 2016-02-17 NOTE — Anesthesia Preprocedure Evaluation (Signed)
Anesthesia Evaluation  Patient identified by MRN, date of birth, ID band Patient awake    Reviewed: Allergy & Precautions, NPO status , Patient's Chart, lab work & pertinent test results  History of Anesthesia Complications (+) PONV and history of anesthetic complications  Airway Mallampati: II  TM Distance: >3 FB Neck ROM: Full    Dental  (+) Implants, Caps, Dental Advisory Given   Pulmonary asthma , COPD,  COPD inhaler, former smoker,    breath sounds clear to auscultation       Cardiovascular hypertension, Pt. on medications (-) angina Rhythm:Regular Rate:Normal     Neuro/Psych  Headaches, Seizures -,  PSYCHIATRIC DISORDERS Left Subdural Hematoma  Neuromuscular disease    GI/Hepatic negative GI ROS, Neg liver ROS,   Endo/Other  diabetes, Type 2, Oral Hypoglycemic AgentsHypothyroidism   Renal/GU Renal diseaseH/o renal cancer: s/p partial nephrectomy  negative genitourinary   Musculoskeletal negative musculoskeletal ROS (+)   Abdominal (+) + obese,   Peds  Hematology negative hematology ROS (+)   Anesthesia Other Findings   Reproductive/Obstetrics                             Anesthesia Physical  Anesthesia Plan  ASA: III and emergent  Anesthesia Plan: General   Post-op Pain Management:    Induction: Intravenous  Airway Management Planned: Oral ETT  Additional Equipment: Arterial line  Intra-op Plan:   Post-operative Plan: Possible Post-op intubation/ventilation  Informed Consent: I have reviewed the patients History and Physical, chart, labs and discussed the procedure including the risks, benefits and alternatives for the proposed anesthesia with the patient or authorized representative who has indicated his/her understanding and acceptance.   Dental advisory given and Consent reviewed with POA  Plan Discussed with: CRNA and Surgeon  Anesthesia Plan Comments: (Plan  routine monitors, A line, GETA with possible post op ventilation)        Anesthesia Quick Evaluation

## 2016-02-17 NOTE — Anesthesia Procedure Notes (Signed)
Procedure Name: Intubation Date/Time: 02/17/2016 2:02 PM Performed by: Suzy Bouchard Pre-anesthesia Checklist: Patient identified, Timeout performed, Emergency Drugs available, Suction available and Patient being monitored Patient Re-evaluated:Patient Re-evaluated prior to inductionOxygen Delivery Method: Circle system utilized Preoxygenation: Pre-oxygenation with 100% oxygen Intubation Type: IV induction, Rapid sequence and Cricoid Pressure applied Laryngoscope Size: Miller and 2 Grade View: Grade II Tube type: Oral Tube size: 7.0 mm Number of attempts: 1 Airway Equipment and Method: Stylet Placement Confirmation: ETT inserted through vocal cords under direct vision,  breath sounds checked- equal and bilateral and CO2 detector Secured at: 21 cm Tube secured with: Tape Dental Injury: Teeth and Oropharynx as per pre-operative assessment  Comments: Oropharynx clear on DL

## 2016-02-18 ENCOUNTER — Inpatient Hospital Stay (HOSPITAL_COMMUNITY): Payer: PPO

## 2016-02-18 ENCOUNTER — Inpatient Hospital Stay (HOSPITAL_COMMUNITY): Payer: PPO | Admitting: Certified Registered"

## 2016-02-18 ENCOUNTER — Encounter (HOSPITAL_COMMUNITY)
Admission: RE | Disposition: A | Discharge status: Rehabilitation Facility | Payer: Self-pay | Source: Ambulatory Visit | Attending: Neurological Surgery

## 2016-02-18 ENCOUNTER — Encounter (HOSPITAL_COMMUNITY): Payer: Self-pay | Admitting: Certified Registered"

## 2016-02-18 DIAGNOSIS — S065X0A Traumatic subdural hemorrhage without loss of consciousness, initial encounter: Secondary | ICD-10-CM | POA: Diagnosis not present

## 2016-02-18 DIAGNOSIS — E878 Other disorders of electrolyte and fluid balance, not elsewhere classified: Secondary | ICD-10-CM | POA: Diagnosis not present

## 2016-02-18 DIAGNOSIS — R930 Abnormal findings on diagnostic imaging of skull and head, not elsewhere classified: Secondary | ICD-10-CM | POA: Diagnosis not present

## 2016-02-18 DIAGNOSIS — I6200 Nontraumatic subdural hemorrhage, unspecified: Secondary | ICD-10-CM | POA: Diagnosis not present

## 2016-02-18 DIAGNOSIS — J449 Chronic obstructive pulmonary disease, unspecified: Secondary | ICD-10-CM | POA: Diagnosis not present

## 2016-02-18 DIAGNOSIS — G936 Cerebral edema: Secondary | ICD-10-CM | POA: Diagnosis not present

## 2016-02-18 DIAGNOSIS — E1122 Type 2 diabetes mellitus with diabetic chronic kidney disease: Secondary | ICD-10-CM | POA: Diagnosis not present

## 2016-02-18 DIAGNOSIS — I6203 Nontraumatic chronic subdural hemorrhage: Secondary | ICD-10-CM | POA: Diagnosis not present

## 2016-02-18 HISTORY — PX: CRANIOTOMY: SHX93

## 2016-02-18 LAB — POCT I-STAT 4, (NA,K, GLUC, HGB,HCT)
Glucose, Bld: 111 mg/dL — ABNORMAL HIGH (ref 65–99)
Glucose, Bld: 254 mg/dL — ABNORMAL HIGH (ref 65–99)
HCT: 36 % (ref 36.0–46.0)
HCT: 31 % — ABNORMAL LOW (ref 36.0–46.0)
Hemoglobin: 12.2 g/dL (ref 12.0–15.0)
Hemoglobin: 10.5 g/dL — ABNORMAL LOW (ref 12.0–15.0)
Potassium: 3.5 mmol/L (ref 3.5–5.1)
Potassium: 3.2 mmol/L — ABNORMAL LOW (ref 3.5–5.1)
Sodium: 141 mmol/L (ref 135–145)
Sodium: 138 mmol/L (ref 135–145)

## 2016-02-18 LAB — BASIC METABOLIC PANEL
Anion gap: 13 (ref 5–15)
BUN: 7 mg/dL (ref 6–20)
CO2: 21 mmol/L — ABNORMAL LOW (ref 22–32)
Calcium: 9.8 mg/dL (ref 8.9–10.3)
Chloride: 106 mmol/L (ref 101–111)
Creatinine, Ser: 0.82 mg/dL (ref 0.44–1.00)
GFR calc Af Amer: >60 mL/min (ref >60)
GFR calc non Af Amer: >60 mL/min (ref >60)
Glucose, Bld: 259 mg/dL — ABNORMAL HIGH (ref 65–99)
Potassium: 3.3 mmol/L — ABNORMAL LOW (ref 3.5–5.1)
Sodium: 140 mmol/L (ref 135–145)

## 2016-02-18 LAB — GLUCOSE, CAPILLARY
Glucose-Capillary: 289 mg/dL — ABNORMAL HIGH (ref 65–99)
Glucose-Capillary: 164 mg/dL — ABNORMAL HIGH (ref 65–99)
Glucose-Capillary: 140 mg/dL — ABNORMAL HIGH (ref 65–99)
Glucose-Capillary: 168 mg/dL — ABNORMAL HIGH (ref 65–99)

## 2016-02-18 LAB — CBC
HCT: 35.6 % — ABNORMAL LOW (ref 36.0–46.0)
Hemoglobin: 11.8 g/dL — ABNORMAL LOW (ref 12.0–15.0)
MCH: 28.4 pg (ref 26.0–34.0)
MCHC: 33.1 g/dL (ref 30.0–36.0)
MCV: 85.6 fL (ref 78.0–100.0)
Platelets: 217 10*3/uL (ref 150–400)
RBC: 4.16 MIL/uL (ref 3.87–5.11)
RDW: 13.9 % (ref 11.5–15.5)
WBC: 16.1 10*3/uL — ABNORMAL HIGH (ref 4.0–10.5)

## 2016-02-18 SURGERY — CRANIOTOMY HEMATOMA EVACUATION SUBDURAL
Anesthesia: General | Site: Head | Laterality: Left

## 2016-02-18 MED ORDER — INSULIN ASPART 100 UNIT/ML ~~LOC~~ SOLN
SUBCUTANEOUS | Status: DC | PRN
  Administered 2016-02-18: 6 [IU] via SUBCUTANEOUS

## 2016-02-18 MED ORDER — CETYLPYRIDINIUM CHLORIDE 0.05 % MT LIQD
7.0000 mL | Freq: Two times a day (BID) | OROMUCOSAL | Status: DC
  Administered 2016-02-18 – 2016-02-25 (×15): 7 mL via OROMUCOSAL

## 2016-02-18 MED ORDER — SUGAMMADEX SODIUM 200 MG/2ML IV SOLN
INTRAVENOUS | Status: AC
  Filled 2016-02-18: qty 2

## 2016-02-18 MED ORDER — SUCCINYLCHOLINE CHLORIDE 20 MG/ML IJ SOLN
INTRAMUSCULAR | Status: AC
  Filled 2016-02-18: qty 1

## 2016-02-18 MED ORDER — CEFAZOLIN SODIUM-DEXTROSE 2-4 GM/100ML-% IV SOLN: 2.0000 g | INTRAVENOUS | Status: DC

## 2016-02-18 MED ORDER — THROMBIN 20000 UNITS EX SOLR
CUTANEOUS | Status: DC | PRN
  Administered 2016-02-18: 20 mL via TOPICAL

## 2016-02-18 MED ORDER — SUCCINYLCHOLINE CHLORIDE 20 MG/ML IJ SOLN
INTRAMUSCULAR | Status: DC | PRN
  Administered 2016-02-18: 80 mg via INTRAVENOUS

## 2016-02-18 MED ORDER — ROCURONIUM BROMIDE 100 MG/10ML IV SOLN
INTRAVENOUS | Status: DC | PRN
  Administered 2016-02-18: 30 mg via INTRAVENOUS

## 2016-02-18 MED ORDER — LIDOCAINE HCL (CARDIAC) 20 MG/ML IV SOLN
INTRAVENOUS | Status: DC | PRN
  Administered 2016-02-18: 100 mg via INTRAVENOUS

## 2016-02-18 MED ORDER — LIDOCAINE HCL (CARDIAC) 20 MG/ML IV SOLN
INTRAVENOUS | Status: AC
  Filled 2016-02-18: qty 5

## 2016-02-18 MED ORDER — BACITRACIN ZINC 500 UNIT/GM EX OINT
TOPICAL_OINTMENT | CUTANEOUS | Status: DC | PRN
  Administered 2016-02-18: 1 via TOPICAL

## 2016-02-18 MED ORDER — MICROFIBRILLAR COLL HEMOSTAT EX PADS
MEDICATED_PAD | CUTANEOUS | Status: DC | PRN
  Administered 2016-02-18: 1 via TOPICAL

## 2016-02-18 MED ORDER — PROPOFOL 10 MG/ML IV BOLUS
INTRAVENOUS | Status: AC
  Filled 2016-02-18: qty 20

## 2016-02-18 MED ORDER — HYDROXYZINE HCL 50 MG/ML IM SOLN
12.5000 mg | Freq: Once | INTRAMUSCULAR | Status: AC
  Administered 2016-02-18: 15 mg via INTRAMUSCULAR
  Filled 2016-02-18: qty 0.3

## 2016-02-18 MED ORDER — SUGAMMADEX SODIUM 200 MG/2ML IV SOLN
INTRAVENOUS | Status: DC | PRN
  Administered 2016-02-18: 200 mg via INTRAVENOUS

## 2016-02-18 MED ORDER — PROPOFOL 10 MG/ML IV BOLUS
INTRAVENOUS | Status: DC | PRN
  Administered 2016-02-18: 50 mg via INTRAVENOUS
  Administered 2016-02-18: 20 mg via INTRAVENOUS
  Administered 2016-02-18: 50 mg via INTRAVENOUS
  Administered 2016-02-18: 70 mg via INTRAVENOUS

## 2016-02-18 MED ORDER — ONDANSETRON HCL 4 MG/2ML IJ SOLN: 4.0000 mg | Freq: Once | INTRAMUSCULAR | Status: DC | PRN

## 2016-02-18 MED ORDER — ONDANSETRON HCL 4 MG/2ML IJ SOLN
INTRAMUSCULAR | Status: AC
  Filled 2016-02-18: qty 2

## 2016-02-18 MED ORDER — FENTANYL CITRATE (PF) 250 MCG/5ML IJ SOLN
INTRAMUSCULAR | Status: AC
  Filled 2016-02-18: qty 5

## 2016-02-18 MED ORDER — HYDROMORPHONE HCL 1 MG/ML IJ SOLN: 0.5000 mg | INTRAMUSCULAR | Status: DC | PRN

## 2016-02-18 MED ORDER — FENTANYL CITRATE (PF) 100 MCG/2ML IJ SOLN
INTRAMUSCULAR | Status: DC | PRN
  Administered 2016-02-18: 100 ug via INTRAVENOUS
  Administered 2016-02-18 (×3): 50 ug via INTRAVENOUS

## 2016-02-18 MED ORDER — SODIUM CHLORIDE 0.9 % IV SOLN
INTRAVENOUS | Status: DC | PRN
  Administered 2016-02-18 (×2): via INTRAVENOUS

## 2016-02-18 MED ORDER — SODIUM CHLORIDE 0.9 % IR SOLN
Status: DC | PRN
  Administered 2016-02-18: 500 mL

## 2016-02-18 MED ORDER — MICROFIBRILLAR COLL HEMOSTAT EX POWD
CUTANEOUS | Status: DC | PRN
  Administered 2016-02-18: 1 g via TOPICAL

## 2016-02-18 MED ORDER — ROCURONIUM BROMIDE 50 MG/5ML IV SOLN
INTRAVENOUS | Status: AC
  Filled 2016-02-18: qty 1

## 2016-02-18 MED ORDER — ONDANSETRON HCL 4 MG/2ML IJ SOLN
INTRAMUSCULAR | Status: DC | PRN
  Administered 2016-02-18: 4 mg via INTRAVENOUS

## 2016-02-18 MED ORDER — 0.9 % SODIUM CHLORIDE (POUR BTL) OPTIME
TOPICAL | Status: DC | PRN
  Administered 2016-02-18 (×2): 1000 mL

## 2016-02-18 SURGICAL SUPPLY — 75 items
BAG DECANTER FOR FLEXI CONT (MISCELLANEOUS) ×3 IMPLANT
BANDAGE GAUZE 4  KLING STR (GAUZE/BANDAGES/DRESSINGS) IMPLANT
BIT DRILL WIRE PASS 1.3MM (BIT) IMPLANT
BLADE SURG 11 STRL SS (BLADE) IMPLANT
BNDG COHESIVE 4X5 TAN NS LF (GAUZE/BANDAGES/DRESSINGS) IMPLANT
BRUSH SCRUB EZ 1% IODOPHOR (MISCELLANEOUS) IMPLANT
BRUSH SCRUB EZ PLAIN DRY (MISCELLANEOUS) ×3 IMPLANT
BUR ACORN 6.0 PRECISION (BURR) IMPLANT
BUR ACORN 6.0MM PRECISION (BURR)
BUR SPIRAL ROUTER 2.3 (BUR) IMPLANT
BUR SPIRAL ROUTER 2.3MM (BUR)
CANISTER SUCT 3000ML PPV (MISCELLANEOUS) ×5 IMPLANT
CLIP TI MEDIUM 6 (CLIP) IMPLANT
COVER BACK TABLE 60X90IN (DRAPES) ×2 IMPLANT
DRAIN CHANNEL 10M FLAT 3/4 FLT (DRAIN) IMPLANT
DRAPE MICROSCOPE LEICA (MISCELLANEOUS) IMPLANT
DRAPE NEUROLOGICAL W/INCISE (DRAPES) ×3 IMPLANT
DRAPE SURG 17X23 STRL (DRAPES) IMPLANT
DRAPE WARM FLUID 44X44 (DRAPE) ×3 IMPLANT
DRILL WIRE PASS 1.3MM (BIT)
DRSG TELFA 3X8 NADH (GAUZE/BANDAGES/DRESSINGS) ×6 IMPLANT
DURAMATRIX ONLAY 2X2 (Neuro Prosthesis/Implant) ×3 IMPLANT
ELECT CAUTERY BLADE 6.4 (BLADE) ×1 IMPLANT
ELECT REM PT RETURN 9FT ADLT (ELECTROSURGICAL) ×3
ELECTRODE REM PT RTRN 9FT ADLT (ELECTROSURGICAL) ×1 IMPLANT
EVACUATOR SILICONE 100CC (DRAIN) IMPLANT
GAUZE SPONGE 4X4 12PLY STRL (GAUZE/BANDAGES/DRESSINGS) ×1 IMPLANT
GAUZE SPONGE 4X4 16PLY XRAY LF (GAUZE/BANDAGES/DRESSINGS) IMPLANT
GLOVE BIOGEL PI IND STRL 7.0 (GLOVE) ×1 IMPLANT
GLOVE BIOGEL PI IND STRL 7.5 (GLOVE) ×1 IMPLANT
GLOVE BIOGEL PI INDICATOR 7.0 (GLOVE) ×2
GLOVE BIOGEL PI INDICATOR 7.5 (GLOVE) ×2
GLOVE ECLIPSE 9.0 STRL (GLOVE) ×3 IMPLANT
GLOVE EXAM NITRILE LRG STRL (GLOVE) IMPLANT
GLOVE EXAM NITRILE MD LF STRL (GLOVE) IMPLANT
GLOVE EXAM NITRILE XL STR (GLOVE) IMPLANT
GLOVE EXAM NITRILE XS STR PU (GLOVE) IMPLANT
GLOVE SS BIOGEL STRL SZ 7 (GLOVE) IMPLANT
GLOVE SUPERSENSE BIOGEL SZ 7 (GLOVE) ×2
GLOVE SURG SS PI 6.5 STRL IVOR (GLOVE) ×6 IMPLANT
GOWN STRL REUS W/ TWL LRG LVL3 (GOWN DISPOSABLE) ×2 IMPLANT
GOWN STRL REUS W/ TWL XL LVL3 (GOWN DISPOSABLE) IMPLANT
GOWN STRL REUS W/TWL 2XL LVL3 (GOWN DISPOSABLE) IMPLANT
GOWN STRL REUS W/TWL LRG LVL3 (GOWN DISPOSABLE) ×6
GOWN STRL REUS W/TWL XL LVL3 (GOWN DISPOSABLE) ×3
HEMOSTAT SURGICEL 2X14 (HEMOSTASIS) ×2 IMPLANT
KIT BASIN OR (CUSTOM PROCEDURE TRAY) ×3 IMPLANT
KIT ROOM TURNOVER OR (KITS) ×3 IMPLANT
LIQUID BAND (GAUZE/BANDAGES/DRESSINGS) IMPLANT
NDL HYPO 25X1 1.5 SAFETY (NEEDLE) ×1 IMPLANT
NEEDLE HYPO 25X1 1.5 SAFETY (NEEDLE) ×3 IMPLANT
NS IRRIG 1000ML POUR BTL (IV SOLUTION) ×5 IMPLANT
PACK CRANIOTOMY (CUSTOM PROCEDURE TRAY) ×3 IMPLANT
PAD ARMBOARD 7.5X6 YLW CONV (MISCELLANEOUS) ×7 IMPLANT
PAD DRESSING TELFA 3X8 NADH (GAUZE/BANDAGES/DRESSINGS) IMPLANT
PAD EYE OVAL STERILE LF (GAUZE/BANDAGES/DRESSINGS) IMPLANT
PATTIES SURGICAL .25X.25 (GAUZE/BANDAGES/DRESSINGS) IMPLANT
PATTIES SURGICAL .5 X.5 (GAUZE/BANDAGES/DRESSINGS) IMPLANT
PATTIES SURGICAL .5 X3 (DISPOSABLE) IMPLANT
PATTIES SURGICAL 1X1 (DISPOSABLE) IMPLANT
PIN MAYFIELD SKULL DISP (PIN) IMPLANT
SCREW SELF DRILL HT 1.5/4MM (Screw) ×8 IMPLANT
SPECIMEN JAR SMALL (MISCELLANEOUS) IMPLANT
SPONGE LAP 18X18 X RAY DECT (DISPOSABLE) IMPLANT
SPONGE NEURO XRAY DETECT 1X3 (DISPOSABLE) IMPLANT
SPONGE SURGIFOAM ABS GEL 100 (HEMOSTASIS) ×3 IMPLANT
STAPLER VISISTAT 35W (STAPLE) ×11 IMPLANT
SUT NURALON 4 0 TR CR/8 (SUTURE) ×4 IMPLANT
SUT VIC AB 2-0 CT2 18 VCP726D (SUTURE) ×10 IMPLANT
SYR CONTROL 10ML LL (SYRINGE) ×1 IMPLANT
TOWEL OR 17X24 6PK STRL BLUE (TOWEL DISPOSABLE) ×3 IMPLANT
TOWEL OR 17X26 10 PK STRL BLUE (TOWEL DISPOSABLE) ×3 IMPLANT
TRAY FOLEY W/METER SILVER 14FR (SET/KITS/TRAYS/PACK) IMPLANT
UNDERPAD 30X30 INCONTINENT (UNDERPADS AND DIAPERS) IMPLANT
WATER STERILE IRR 1000ML POUR (IV SOLUTION) ×3 IMPLANT

## 2016-02-18 NOTE — Anesthesia Preprocedure Evaluation (Addendum)
Anesthesia Evaluation  Patient identified by MRN, date of birth, ID band  Reviewed: Allergy & Precautions, NPO status , Patient's Chart, lab work & pertinent test results  History of Anesthesia Complications (+) PONV and history of anesthetic complications  Airway Mallampati: III  TM Distance: >3 FB Neck ROM: Full    Dental  (+) Implants, Caps, Dental Advisory Given   Pulmonary asthma , COPD,  COPD inhaler, former smoker,    breath sounds clear to auscultation + decreased breath sounds+ wheezing      Cardiovascular hypertension, Pt. on medications (-) angina Rhythm:Regular Rate:Normal     Neuro/Psych  Headaches, Seizures -,  PSYCHIATRIC DISORDERS Left Subdural Hematoma post crani 02/17/16  CT IMPRESSION: 1. Postoperative changes from repeat left craniotomy for evacuation of a left subdural hematoma. 2. Increased acute blood products within the left subdural collection, increased in size now measuring up to 14 mm. Associated left-to-right shift also increased now measuring 9 mm. 3. Question focal cortical hypodensity within the posterior left frontal region, which may reflect evolving ischemia. This is not entirely certain, as the patient is slightly rotated on this exam,which may be contributing to this appearance. Attention at follow-up recommended. Results were called by telephone at the time of interpretation on 02/18/2016 at 3:31 am to the critical care nurse taking care of the patient, Arnetha Massy , who verbally acknowledged these results.  Neuromuscular disease    GI/Hepatic negative GI ROS, Neg liver ROS,   Endo/Other  diabetes, Type 2, Oral Hypoglycemic AgentsHypothyroidism Morbid obesity  Renal/GU Renal diseaseH/o renal cancer: s/p partial nephrectomy  negative genitourinary   Musculoskeletal negative musculoskeletal ROS (+)   Abdominal (+) + obese,   Peds  Hematology negative hematology ROS (+)   Anesthesia Other  Findings   Reproductive/Obstetrics                          Anesthesia Physical  Anesthesia Plan  ASA: III and emergent  Anesthesia Plan: General   Post-op Pain Management:    Induction: Intravenous  Airway Management Planned: Oral ETT  Additional Equipment: Arterial line  Intra-op Plan:   Post-operative Plan: Possible Post-op intubation/ventilation  Informed Consent: I have reviewed the patients History and Physical, chart, labs and discussed the procedure including the risks, benefits and alternatives for the proposed anesthesia with the patient or authorized representative who has indicated his/her understanding and acceptance.   Dental advisory given and Consent reviewed with POA  Plan Discussed with: CRNA, Surgeon and Anesthesiologist  Anesthesia Plan Comments: (Plan routine monitors, A line n situ, GETA with possible post op ventilation)        Anesthesia Quick Evaluation

## 2016-02-18 NOTE — Progress Notes (Signed)
PT Cancellation Note  Patient Details Name: [name] MRN: [id number] DOB: January 17, 1933   Cancelled Treatment:    Reason Eval/Treat Not Completed: Patient not medically ready. Pt currently in OR due to neural decline and need for further evacuation of subdural hematoma. Will continue to follow and complete PT evaluation when appropriate.    Rolinda Roan 02/18/2016, 7:08 AM   Rolinda Roan, PT, DPT Acute Rehabilitation Services Pager: [phone]

## 2016-02-18 NOTE — Progress Notes (Signed)
Patient has had three small vomiting episodes since midnight. Zofran was given two times per report and CT was completed at 0100. Dr. Annette Stable of Neurosurgery was notified following the third vomiting episode and order for Vistaril was received and administered. Upon bedside report at 0300, patient was noted to have decreased movement in her right arm. She is still able to follow commands, but is no longer nodding or shaking her head appropriately in response to yes and no questions. Dr. Jeannine Boga of radiology called regarding the CT results which revealed an increase in bleeding at the subdural site as well as an increase in midline shift. Dr. Annette Stable of Neurosurgery was contacted with these results. No new orders at this time. Will continue to monitor patient and update as needed.

## 2016-02-18 NOTE — Anesthesia Postprocedure Evaluation (Signed)
Anesthesia Post Note  Patient: Carolyn [name]  Procedure(s) Performed: Procedure(s) (LRB): CRANIOTOMY HEMATOMA EVACUATION SUBDURAL (Left)  Patient location during evaluation: NICU Anesthesia Type: General Level of consciousness: awake, sedated and patient cooperative Pain management: pain level controlled Vital Signs Assessment: post-procedure vital signs reviewed and stable Respiratory status: spontaneous breathing, patient connected to face mask oxygen and respiratory function stable Cardiovascular status: blood pressure returned to baseline and stable Anesthetic complications: no    Last Vitals:  Filed Vitals:   02/18/16 0715 02/18/16 0730  BP: 145/114 144/70  Pulse: 91 94  Temp:    Resp: 16 19    Last Pain:  Filed Vitals:   02/18/16 0745  PainSc: 0-No pain                 [name] EDWARD

## 2016-02-18 NOTE — Progress Notes (Signed)
Spontaneous, purposeful movements bilaterally, L>R Will repeat CT scan this afternoon

## 2016-02-18 NOTE — Progress Notes (Signed)
D; unable to take PO . Tried to give spoonful of water, drooling and no effort to take sips. Hold po meds foe now.

## 2016-02-18 NOTE — Anesthesia Procedure Notes (Signed)
Procedure Name: Intubation Date/Time: 02/18/2016 5:38 AM Performed by: Manuela Schwartz B Pre-anesthesia Checklist: Patient identified, Emergency Drugs available, Suction available, Patient being monitored and Timeout performed Patient Re-evaluated:Patient Re-evaluated prior to inductionOxygen Delivery Method: Circle system utilized Preoxygenation: Pre-oxygenation with 100% oxygen Intubation Type: IV induction and Rapid sequence Laryngoscope Size: Mac and 3 Grade View: Grade I Tube type: Subglottic suction tube Tube size: 7.5 mm Number of attempts: 1 Airway Equipment and Method: Stylet Placement Confirmation: ETT inserted through vocal cords under direct vision,  positive ETCO2 and breath sounds checked- equal and bilateral Secured at: 22 cm Tube secured with: Tape Dental Injury: Teeth and Oropharynx as per pre-operative assessment

## 2016-02-18 NOTE — Anesthesia Postprocedure Evaluation (Signed)
Anesthesia Post Note  Patient: Carolyn [name]  Procedure(s) Performed: Procedure(s) (LRB): CRANIOTOMY HEMATOMA EVACUATION SUBDURAL (N/A)  Patient location during evaluation: PACU Anesthesia Type: General Level of consciousness: awake and alert Pain management: pain level controlled Vital Signs Assessment: post-procedure vital signs reviewed and stable Respiratory status: spontaneous breathing, nonlabored ventilation, respiratory function stable and patient connected to nasal cannula oxygen Cardiovascular status: blood pressure returned to baseline and stable Postop Assessment: no signs of nausea or vomiting Anesthetic complications: no    Last Vitals:  Filed Vitals:   02/18/16 1000 02/18/16 1100  BP: 127/66 139/77  Pulse: 95 99  Temp:    Resp: 13 21    Last Pain:  Filed Vitals:   02/18/16 1124  PainSc: 0-No pain                 Effie Berkshire

## 2016-02-18 NOTE — Progress Notes (Signed)
Attempted Dulera administration with spacer, however, patient not able to seal lips around mouth piece or preform deep inspiration on command.  Recommend changing to nebulized solution (Pulmicort?) instead.

## 2016-02-18 NOTE — Care Management Note (Signed)
Case Management Note  Patient Details  Name: ARWA SEBER MRN: [id number] Date of Birth: 10/05/1933  Subjective/Objective:   Pt admitted on 02/17/16 s/p recurrent SDH s/p craniotomy x 2.  PTA, pt was receiving rehab services at [facility].                    Action/Plan: Will follow progress.  Will need PT/OT when able to tolerate; hopeful for return to CIR when medically stable.    Expected Discharge Date:                  Expected Discharge Plan:  [place]  In-House Referral:     Discharge planning Services  CM Consult  Post Acute Care Choice:    Choice offered to:     DME Arranged:    DME Agency:     HH Arranged:    [place] Agency:     Status of Service:  In process, will continue to follow  Medicare Important Message Given:    Date Medicare IM Given:    Medicare IM give by:    Date Additional Medicare IM Given:    Additional Medicare Important Message give by:     If discussed at [place] of Stay Meetings, dates discussed:    Additional Comments:  Reinaldo Raddle, RN, BSN  Trauma/Neuro ICU Case Manager [phone]

## 2016-02-18 NOTE — Progress Notes (Signed)
OT Cancellation Note  Patient Details Name: Carolyn [name] MRN: [id number] DOB: 08-03-1933   Cancelled Treatment:    Reason Eval/Treat Not Completed: Patient not medically ready - Pt to OR early this am due for repeat crani due to neural decline overnight.  Will check back tomorrow for appropriateness.   Darlina Rumpf [place], OTR/L [id number]  02/18/2016, 11:20 AM

## 2016-02-18 NOTE — Discharge Summary (Signed)
Carolyn [name], Carolyn [name] NO.:  [id number]  MEDICAL RECORD NO.:  [id number]  LOCATION:  [id number]                        FACILITY:  [facility]  PHYSICIAN:  Carolyn [name], M.D.DATE OF BIRTH:  1933/02/14  DATE OF ADMISSION:  02/12/2016 DATE OF DISCHARGE:  02/17/2016                              DISCHARGE SUMMARY   DISCHARGE DIAGNOSES: 1. Chronic traumatic subdural hematoma. 2. Sequential compression devices for deep vein thrombosis     prophylaxis. 3. Pain management with headaches. 4. Seizure disorder. 5. Chronic obstructive pulmonary disease. 6. Dysphagia with decreased nutritional storage. 7. Hypertension. 8. Diabetes mellitus. 9. Hypothyroidism.  HISTORY OF PRESENT ILLNESS:  This is an 82 year old right-handed female with history of COPD, lives with her husband and daughter.  Used a walker prior to admission.  The patient with a history of subdural hematoma after multiple falls beginning in March 2017 as well as a left radial head fracture.  Family knows persistent headaches.  The patient with recent simple partial seizure presented to the emergency department on February 06, 2016, with cranial CT scan revealing a large and chronic subdural hematoma.  Underwent left craniotomy evacuation of subdural hematoma on February 06, 2016, per Dr. Cyndy Freeze.  Maintained on Keppra for seizure prophylaxis.  Repeat cranial CT scan on February 09, 2016, suggestive of increased pain with mild increasing midline shift. Physical and occupational therapy ongoing.  The patient was admitted for a comprehensive rehab program.  PAST MEDICAL HISTORY:  See discharge diagnoses.  SOCIAL HISTORY:  Lives with her husband, daughter.  Used a walker prior to admission, noted recurrent falls.  FUNCTIONAL STATUS UPON ADMISSION TO REHAB SERVICES:  Minimal assist +2 safety ambulate 24 feet rolling walker, minimal assist stand pivot transfers, moderate max assist for activities of daily living.  PHYSICAL  EXAMINATION:  VITAL SIGNS:  Blood pressure 123/74, pulse 67, temperature 98, respirations 18. GENERAL:  This was an alert female, she did make eye contact with examiner. LUNGS:  Decreased breath sounds.  Clear to auscultation. CARDIAC:  Regular rhythm, no murmur. ABDOMEN:  Soft, nontender.  Good bowel sounds. NEURO:  She was a bit restless.  She expressed some inappropriate judgment.  Craniotomy site clean and dry.  REHABILITATION HOSPITAL COURSE:  The patient was admitted to Inpatient Rehab Services with therapies initiated on a 3-hour daily basis, consisting of physical therapy, occupational therapy, speech therapy, and rehabilitation nursing.  The following issues were addressed during the patient's rehabilitation stay.  Pertaining to [name]'s acute on chronic traumatic subdural hematoma noted, multiple recent falls since early March followed by Dr. Cyndy Freeze of Neurosurgery, she had recently underwent left craniotomy for evacuation of subdural hematoma on February 06, 2016.  She was admitted to Inpatient Rehab Services on February 12, 2016, noted persistent headaches which have been ongoing prior to admission.  Trial of Topamax later discontinued due to some increasing possible sedation.  However, cranial CT scans while on the rehab unit completed February 14, 2016, showed some continued mixed density left subdural hematoma, monitoring of stable left to right midline shift. The patient with increasing difficulties in swallowing, her diet was downgraded.  Multiple talks with daughter in regard to the patient's inconsistencies  to attend therapies.  A followup cranial CT scan was completed and reviewed by Neurosurgery that notes left frontal subdural fluid collection still causing some mass effect felt subdural must be more loculated than what was apparent on the initial OR.  Due to these findings, recent patient decline in functional mobility and cognition, it was felt the patient should  return back to the operating room for craniotomy evaluation per Dr. Cyndy Freeze.  All issues in regard to surgical intervention discussed at length with the patient and daughter.  The patient was discharged in guarded condition.  All medication changes made as per Neurosurgery.     Lauraine Rinne, P.A.   ______________________________ Carolyn [name], M.D.    DA/MEDQ  D:  02/17/2016  T:  02/18/2016  Job:  [id number]

## 2016-02-18 NOTE — Progress Notes (Signed)
Pt readmitted to acute by Dr. Cyndy Freeze from inpt rehab. Admitted to inpt rehab 02/12/16. I will follow her progress. [id number]

## 2016-02-18 NOTE — Progress Notes (Signed)
The patient has declined neurologically through the night. She has become less verbal and is no longer moving her right upper extremity. Follow-up head CT scan demonstrates reaccumulation of left convexity subdural hematoma with significant mass effect.  On examination she is awake and aware. She is essentially nonverbal but will moan and groan and say a few simple words. Her pupils are 3 mm and brisk bilaterally. Her gaze is conjugate. She has some mild facial weakness on the right side. Very minimal flexion of right upper extremity to deep pain. Wound clean and dry.  Significant postoperative hemorrhage with reaccumulation of left convexity subdural hematoma. I discussed situation with the patient's daughter. I recommended returning to the operating room for evacuation of subdural hematoma.

## 2016-02-18 NOTE — Op Note (Signed)
02/17/2016 - 02/18/2016  7:01 AM  PATIENT: Carolyn [name] 82 y.o. female  PRE-OPERATIVE DIAGNOSIS: Recurrent acute left-sided epidural and subdural hematoma  POST-OPERATIVE DIAGNOSIS: Same  PROCEDURE: Reentry left craniotomy for evacuation of subdural hematoma  SURGEON: Aldean Ast, MD  ASSISTANTS: Earnie Larsson, M.D.  ANESTHESIA: General  DRAINS: None  SPECIMEN: None  INDICATION FOR PROCEDURE: 82 year old female underwent evacuation of residual chronic subdural hematoma yesterday. She declined neurologically overnight and her CT showed recurrent acute subdural hematoma. Dr. Annette Stable saw her and recommended reentry craniotomy. Patient understood the risks, benefits, and alternatives and potential outcomes and wished to proceed.  PROCEDURE DETAILS: After smooth induction of general endotracheal anesthesia the patient was transferred to the OR table. The skin was prepped and draped in the usual sterile fashion. The existing incision was reopened. The musculocutaneous flap was then elevated and reflected anteriorly.  The prior craniotomy flap was removed. There was a component of epidural hematoma.  This was evacuated.  The dura was reopened. There was acute blood in the subdural space. This was irrigated vigorously and suction aspirated. There was no active bleeding. There was excellent hemostasis. Irrigation was performed with bacitracin saline. The dura was closed with simple interrupted sutures and an on lay of DuraMatrix.  The craniotomy flap was re-inserted, tacked to the dura, and fixated to the skull. I irrigated vigorously again. The wound was then closed in routine anatomic layers. The skin was closed with staples. A sterile dressing was applied. The patient was moved to the stretcher and allowed to waken from anesthesia.  PATIENT DISPOSITION: ICU - extubated and stable.  Delay start of Pharmacological VTE agent (>24hrs) due to surgical blood loss or risk of bleeding:  yes

## 2016-02-18 NOTE — Consult Note (Signed)
NEUROBEHAVIORAL STATUS EXAM - [facility]   MEDICAL NECESSITY:  Akala Laffitte was seen on the [facility] for a neurobehavioral status exam to assist in treatment planning during admission.   Records indicate that Mrs. Buri is an "82 y.o. right handed female with history of hypertension, diabetes mellitus, COPD.Daughter is an Therapist, sports who is a full-time caregiver for both the patient and the patient's spouse. Patient with history of subdural hematoma after multiple falls beginning in March 2017 as well as left radial head fracture. Family notes patient with persistent headaches. Patient with recent simple partial seizure and presented to the emergency department 02/06/2016. Cranial CT scan revealed enlarging chronic subdural hematoma. Underwent left craniotomy for evacuation of subdural hematoma 02/06/2016 per Dr. Cyndy Freeze. Maintained on Keppra for suspect seizure. Tolerating a regular consistency diet.M.D. has requested physical medicine rehabilitation consult. Patient was admitted for comprehensive rehabilitation program."   During today's visit, Mrs. Hannasch was accompanied by her daughter [name]) who assisted with the history. Given the nature and severity of the patient's cognitive issues, most information was provided by her. Patient's daughter described more recent onset of difficulty with expressive speech, poor memory and confusion, word finding, and disorientation. Of note, the patient's daughter does not feel that the unresponsiveness and expressive dysphasia started until recently, possibly until after she prescribed Topamax. However, it is noteworthy that according to social worker, the patient was much more responsive the other day as compared to when I saw her on 02/17/2016.  From an emotional standpoint, Mrs. Jubb was unable to describe her current mood stating "I don't know" when asked how she was feeling. She has no history of  mental health treatment. She has been experiencing poor appetite that is slowly coming back and has possibly been dehydrated of late. Her daughter feels that since these issues have been resolving her mother has improved. No adjustment issues endorsed. Suicidal/homicidal ideation, plan or intent was denied. No manic or hypomanic episodes were reported. The patient denied ever experiencing any auditory/visual hallucinations. No major behavioral or personality changes were endorsed.   Patient has not participated in therapy since last Thursday. Her daughter mentioned that they worked her really hard and so she the rehabilitation team "has only been observing her." Given new onset symptoms, an updated head CT scan was performed that the daughter said did not note any major changes. She described the rehabilitation team as "excellent."    PROCEDURES: [2 units 96116] Diagnostic clinical interview  Review of available records Mental Status Exam-2 (brief version)  MENTAL STATUS: Mrs. Meltzer mental status exam score of 5/16 was below the cutoff used to indicate cognitive impairment. She was not fully oriented to time, and was generally disoriented to place. Immediate registration of new material was impaired, though short delay free recall was generally within normal limits. Overall, her issues seemed to be secondary to delirium-like symptoms.    IMPRESSION: Overall, Mrs. Michno appears to be experienced a severe degree of cognitive impairment, mostly related to expressive language difficulties as well as sporadic confusion and disorientation; almost delirium-like.  It is my best guess at this point that the etiology of her issues may be multifactorial and include, but not be limited to, the sequelae of head trauma with subsequent subdural hematomas, metabolic dysfunction (i.e., dehydration and low calorie intake), certain medication side effects (e.g., Topamax), or some combination thereof. If the updated  head CT scan had showed any changes then I might suspect another evolving subdural  hematoma but it reportedly showed no changes. It is my hope that with continued treatment and time that her difficulties will resolve. If not, then other etiologies can be entertained. Neuropsychology will follow-up as needed through the remainder of his admission.  OF NOTE: After the completion of this note, it was found in the medical record that Mrs. Blick underwent another craniotomy with subdural hematoma evacuation. This is the likely cause of her cognitive decline. Patient is no longer on the rehab unit so follow-up is not warranted.    Rutha Bouchard, Psy.D.  Clinical Neuropsychologist

## 2016-02-18 NOTE — Progress Notes (Signed)
Pt to OR with CRNA and OR nurse. Consent obtained for procedure from pt's daughter. Pt's son and daughter at bedside when pt was transported.

## 2016-02-18 NOTE — Progress Notes (Signed)
More purposeful per bedside nurse Patient recently received IV dilaudid Non verbal at this point Scan looks better Shift improving Left posterior frontal hypodensity likely venous infarct from bridging vein Expect mental status and neurological performance to improve with time

## 2016-02-18 NOTE — Transfer of Care (Signed)
Immediate Anesthesia Transfer of Care Note  Patient: Carolyn [name]  Procedure(s) Performed: Procedure(s): CRANIOTOMY HEMATOMA EVACUATION SUBDURAL (Left)  Patient Location: ICU  Anesthesia Type:General  Level of Consciousness: awake, pateint uncooperative and confused  Airway & Oxygen Therapy: Patient Spontanous Breathing and Patient connected to nasal cannula oxygen  Post-op Assessment: Report given to RN and Post -op Vital signs reviewed and stable  Post vital signs: Reviewed and stable  Last Vitals:  Filed Vitals:   02/18/16 0400 02/18/16 0500  BP: 130/65 137/71  Pulse: 95 96  Temp: 36.6 C   Resp: 18 22    Complications: No apparent anesthesia complications

## 2016-02-19 ENCOUNTER — Other Ambulatory Visit: Payer: PPO

## 2016-02-19 ENCOUNTER — Encounter (HOSPITAL_COMMUNITY): Payer: Self-pay | Admitting: Neurosurgery

## 2016-02-19 DIAGNOSIS — J9601 Acute respiratory failure with hypoxia: Secondary | ICD-10-CM | POA: Diagnosis not present

## 2016-02-19 DIAGNOSIS — I493 Ventricular premature depolarization: Secondary | ICD-10-CM

## 2016-02-19 DIAGNOSIS — I6200 Nontraumatic subdural hemorrhage, unspecified: Secondary | ICD-10-CM | POA: Diagnosis not present

## 2016-02-19 DIAGNOSIS — E1122 Type 2 diabetes mellitus with diabetic chronic kidney disease: Secondary | ICD-10-CM | POA: Diagnosis not present

## 2016-02-19 DIAGNOSIS — I10 Essential (primary) hypertension: Secondary | ICD-10-CM

## 2016-02-19 DIAGNOSIS — I6203 Nontraumatic chronic subdural hemorrhage: Secondary | ICD-10-CM | POA: Diagnosis not present

## 2016-02-19 DIAGNOSIS — G936 Cerebral edema: Secondary | ICD-10-CM | POA: Diagnosis not present

## 2016-02-19 DIAGNOSIS — G8110 Spastic hemiplegia affecting unspecified side: Secondary | ICD-10-CM

## 2016-02-19 DIAGNOSIS — E878 Other disorders of electrolyte and fluid balance, not elsewhere classified: Secondary | ICD-10-CM | POA: Diagnosis not present

## 2016-02-19 LAB — GLUCOSE, CAPILLARY
Glucose-Capillary: 153 mg/dL — ABNORMAL HIGH (ref 65–99)
Glucose-Capillary: 119 mg/dL — ABNORMAL HIGH (ref 65–99)
Glucose-Capillary: 139 mg/dL — ABNORMAL HIGH (ref 65–99)
Glucose-Capillary: 137 mg/dL — ABNORMAL HIGH (ref 65–99)
Glucose-Capillary: 104 mg/dL — ABNORMAL HIGH (ref 65–99)

## 2016-02-19 MED ORDER — METOPROLOL TARTRATE 1 MG/ML IV SOLN: 5.0000 mg | Freq: Every day | INTRAVENOUS | Status: DC

## 2016-02-19 MED ORDER — METOPROLOL TARTRATE 25 MG/10 ML ORAL SUSPENSION
25.0000 mg | Freq: Two times a day (BID) | ORAL | Status: DC
  Filled 2016-02-19: qty 10

## 2016-02-19 MED ORDER — METOPROLOL TARTRATE 1 MG/ML IV SOLN
5.0000 mg | Freq: Four times a day (QID) | INTRAVENOUS | Status: DC
  Administered 2016-02-19 – 2016-02-25 (×23): 5 mg via INTRAVENOUS
  Filled 2016-02-19 (×23): qty 5

## 2016-02-19 MED ORDER — METOPROLOL TARTRATE 1 MG/ML IV SOLN
5.0000 mg | Freq: Once | INTRAVENOUS | Status: AC
  Administered 2016-02-19: 5 mg via INTRAVENOUS
  Filled 2016-02-19: qty 5

## 2016-02-19 MED ORDER — SODIUM CHLORIDE 0.9 % IV BOLUS (SEPSIS)
500.0000 mL | Freq: Once | INTRAVENOUS | Status: AC
  Administered 2016-02-19: 500 mL via INTRAVENOUS

## 2016-02-19 NOTE — Progress Notes (Signed)
I met with daughter to discuss pt's progress and with PT and OT.I will follow her progress to assist with readmit to CIR when medically appropriate and pending insurance approval. [phone]

## 2016-02-19 NOTE — Evaluation (Signed)
Physical Therapy Evaluation Patient Details Name: Carolyn [name] MRN: [id number] DOB: 1933/08/25 Today's Date: 02/19/2016   History of Present Illness  Pt admitted from CIR due to neurological decline.  He underwent craniotomy x 2 for evacuation of chronic SDH Lt parieto-occipital subdural hemoatoma.   Pt suffered fall 3/6 after fall.  She sustained SDH and was discharged home 3/7.  She was readmitted 02/05/16  due to headaches and neuro changes.  She underwent craniotomy 02/06/16.  She was discharged to [facility] 3./29/17.  PMH includes:  DM, HTN, Glaucoma, COPD, Asthma, Kidney CA.  She sustained Lt radius fracture initial fall 01/20/16.   Clinical Impression  Pt known to this PT from previous admit.  Pt with minimal active movement on R side, but does spontaneously attempt movement.  Pt with R sided inattention, but can direct gaze to R side with cueing.  Pt had been at CIR prior to this admit and would benefit from return to CIR for continued therapies.  Will continue to follow.      Follow Up Recommendations CIR    Equipment Recommendations  None recommended by PT    Recommendations for Other Services       Precautions / Restrictions Precautions Precautions: Fall Restrictions Weight Bearing Restrictions: No      Mobility  Bed Mobility Overal bed mobility: Needs Assistance Bed Mobility: Supine to Sit;Sit to Supine     Supine to sit: Max assist;+2 for physical assistance Sit to supine: Max assist;+2 for physical assistance   General bed mobility comments: Pt able to assist with lifting trunk and with lifting LEs onto bed   Transfers                 General transfer comment: Did not attempt this date due to pt fatigue   Ambulation/Gait                Stairs            Wheelchair Mobility    Modified Rankin (Stroke Patients Only)       Balance Overall balance assessment: Needs assistance Sitting-balance support: Feet supported;Single extremity  supported Sitting balance-Leahy Scale: Poor Sitting balance - Comments: Pt initially required mod A for EOB sitting, but quickly progressed to min guard assist for static sitting                                      Pertinent Vitals/Pain Pain Assessment: Faces Faces Pain Scale: No hurt    Home Living Family/patient expects to be discharged to:: Inpatient rehab Living Arrangements: Spouse/significant other;Children Available Help at Discharge: Family;Available 24 hours/day Type of Home: House Home Access: Stairs to enter Entrance Stairs-Rails: Left;Right;Can reach both Entrance Stairs-Number of Steps: 2 Home Layout: One level   Additional Comments: daughter lives w/ pt and pt's husband.  Pt's husband mainly sits or stays in bed and is not very active, requiring assist from daughter for ADLs and ambulation.    Prior Function Level of Independence: Needs assistance   Gait / Transfers Assistance Needed: Pt was ambulating short distances with Min A using RW on CIR.  Prior to admission 3/22, pt was mod I  ADL's / Homemaking Assistance Needed: Pt required min - mod A on CIR.  Prior to initial fall was very independent         Hand Dominance   Dominant Hand: Right    Extremity/Trunk Assessment  Upper Extremity Assessment: Defer to OT evaluation       LUE Deficits / Details: Pt initially made no attempts to move Lt UE.  But when told to wipe her mouth, she attempted to reach for the washcloth and required mod A to wipe mouth.  Unable to perform a second time - appears apraxic.  Pt will attempt to flex fingers minimally    Lower Extremity Assessment: Generalized weakness;RLE deficits/detail RLE Deficits / Details: pt initially not actively moving LE, but while sitting EOB was noted to attempt to shift positioning of LE spontaneously.  Possible increased tone vs resisting movement.  Pain sensation intact.    Cervical / Trunk Assessment: Normal  Communication    Communication: No difficulties  Cognition Arousal/Alertness: Awake/alert Behavior During Therapy: Flat affect Overall Cognitive Status: Impaired/Different from baseline Area of Impairment: Following commands;Problem solving   Current Attention Level: Focused;Sustained   Following Commands: Follows one step commands with increased time;Follows one step commands inconsistently     Problem Solving: Slow processing;Decreased initiation General Comments: Pt is non verbal.  She has ~10-20 second delay with responses     General Comments General comments (skin integrity, edema, etc.): daughter and son present     Exercises        Assessment/Plan    PT Assessment Patient needs continued PT services  PT Diagnosis Difficulty walking;Generalized weakness;Altered mental status   PT Problem List Decreased strength;Decreased activity tolerance;Decreased balance;Decreased coordination;Decreased mobility;Decreased cognition;Decreased knowledge of use of DME;Decreased safety awareness;Obesity  PT Treatment Interventions DME instruction;Gait training;Stair training;Functional mobility training;Therapeutic activities;Therapeutic exercise;Balance training;Neuromuscular re-education;Cognitive remediation;Patient/family education   PT Goals (Current goals can be found in the Care Plan section) Acute Rehab PT Goals Patient Stated Goal: to get better - per daughter  PT Goal Formulation: With patient/family Time For Goal Achievement: 03/04/16 Potential to Achieve Goals: Good    Frequency Min 3X/week   Barriers to discharge        Co-evaluation PT/OT/SLP Co-Evaluation/Treatment: Yes Reason for Co-Treatment: For patient/therapist safety PT goals addressed during session: Mobility/safety with mobility;Balance OT goals addressed during session: ADL's and self-care;Strengthening/ROM       End of Session Equipment Utilized During Treatment: Oxygen Activity Tolerance: Patient tolerated treatment  well Patient left: in bed;with call bell/phone within reach;with family/visitor present Nurse Communication: Mobility status;Need for lift equipment         Time: [id number] PT Time Calculation (min) (ACUTE ONLY): 22 min   Charges:   PT Evaluation $PT Eval Moderate Complexity: 1 Procedure     PT G CodesCatarina Hartshorn, [place] [id number] 02/19/2016, 2:16 PM

## 2016-02-19 NOTE — Evaluation (Addendum)
Occupational Therapy Evaluation Patient Details Name: Carolyn [name] MRN: [id number] DOB: 11-Apr-1933 Today's Date: 02/19/2016    History of Present Illness Pt admitted from CIR due to neurological decline.  He underwent craniotomy x 2 for evacuation of chronic SDH Lt parieto-occipital subdural hemoatoma.   Pt suffered fall 3/6 after fall.  She sustained SDH and was discharged home 3/7.  She was readmitted 02/05/16  due to headaches and neuro changes.  She underwent craniotomy 02/06/16.  She was discharged to [facility] 3./29/17.  PMH includes:  DM, HTN, Glaucoma, COPD, Asthma, Kidney CA.  She sustained Lt radius fracture initial fall 01/20/16.    Clinical Impression   Pt admitted with above. She demonstrates the below listed deficits and will benefit from continued OT to maximize safety and independence with BADLs.  Pt presents to OT with Lt hemiparesis, Lt inattention, cognitive and communication deficits.  She required max A ++2 to move to EOB.  She will follow one step commands inconsistently with a significant delay.  Minimal movement Rt UE and LE noted spontaneously.   She requires total A for ADLs.  Recommend CIR.       Follow Up Recommendations  CIR    Equipment Recommendations  3 in 1 bedside comode    Recommendations for Other Services Rehab consult     Precautions / Restrictions Precautions Precautions: Fall      Mobility Bed Mobility Overal bed mobility: Needs Assistance Bed Mobility: Supine to Sit;Sit to Supine     Supine to sit: Max assist;+2 for physical assistance Sit to supine: Max assist;+2 for physical assistance   General bed mobility comments: Pt able to assist with lifting trunk and with lifting LEs onto bed   Transfers                 General transfer comment: Did not attempt this date due to pt fatigue     Balance Overall balance assessment: Needs assistance Sitting-balance support: Feet supported;Single extremity supported Sitting balance-Leahy Scale:  Poor Sitting balance - Comments: Pt initially required mod A for EOB sitting, but quickly progressed to min guard assist for static sitting                                     ADL Overall ADL's : Needs assistance/impaired Eating/Feeding: NPO   Grooming: Wash/dry hands;Wash/dry face;Oral care;Brushing hair;Total assistance;Sitting   Upper Body Bathing: Total assistance;Sitting   Lower Body Bathing: Total assistance;Bed level   Upper Body Dressing : Total assistance;Sitting   Lower Body Dressing: Total assistance;Bed level   Toilet Transfer: Total assistance Toilet Transfer Details (indicate cue type and reason): unable  Toileting- Clothing Manipulation and Hygiene: Total assistance;Bed level       Functional mobility during ADLs: Maximal assistance;+2 for physical assistance (to EOB ) General ADL Comments: Pt with very delayed responses, and decreased initiation      Vision Additional Comments: Pt with a Lt gaze preference, but will attend to Lt and will look for objects and people on the Lt.  Pt is unable to participate in formal assessment at this time    Perception Perception Perception Tested?: Yes Perception Deficits: Inattention/neglect Inattention/Neglect: Does not attend to left side of body;Does not attend to left visual field   Praxis Praxis Praxis tested?: Deficits Deficits: Initiation;Ideomotor;Motor Impersistence    Pertinent Vitals/Pain Pain Assessment: Faces Faces Pain Scale: No hurt     Hand Dominance  Right   Extremity/Trunk Assessment Upper Extremity Assessment Upper Extremity Assessment: LUE deficits/detail LUE Deficits / Details: Pt initially made no attempts to move Lt UE.  But when told to wipe her mouth, she attempted to reach for the washcloth and required mod A to wipe mouth.  Unable to perform a second time - appears apraxic.  Pt will attempt to flex fingers minimally    Lower Extremity Assessment Lower Extremity Assessment:  Defer to PT evaluation   Cervical / Trunk Assessment Cervical / Trunk Assessment: Normal   Communication Communication Communication: No difficulties   Cognition Arousal/Alertness: Awake/alert Behavior During Therapy: Flat affect Overall Cognitive Status: Impaired/Different from baseline Area of Impairment: Following commands;Problem solving   Current Attention Level: Focused;Sustained   Following Commands: Follows one step commands with increased time;Follows one step commands inconsistently     Problem Solving: Slow processing;Decreased initiation General Comments: Pt is non verbal.  She has ~10-20 second delay with responses    General Comments       Exercises       Shoulder Instructions      Home Living Family/patient expects to be discharged to:: Inpatient rehab Living Arrangements: Spouse/significant other;Children Available Help at Discharge: Family;Available 24 hours/day Type of Home: House Home Access: Stairs to enter [employer] of Steps: 2 Entrance Stairs-Rails: Left;Right;Can reach both Home Layout: One level     Bathroom Shower/Tub: Tub/shower unit;Walk-in shower Shower/tub characteristics: Curtain Biochemist, clinical: Handicapped height Bathroom Accessibility: Yes How Accessible: Accessible via walker     Additional Comments: daughter lives w/ pt and pt's husband.  Pt's husband mainly sits or stays in bed and is not very active, requiring assist from daughter for ADLs and ambulation.  Lives With: Daughter;Spouse    Prior Functioning/Environment Level of Independence: Needs assistance  Gait / Transfers Assistance Needed: Pt was ambulating short distances with Min A using RW on CIR.  Prior to admission 3/22, pt was mod I ADL's / Homemaking Assistance Needed: Pt required min - mod A on CIR.  Prior to initial fall was very independent  Communication / Swallowing Assistance Needed: `      OT Diagnosis: Generalized weakness;Cognitive  deficits;Disturbance of vision;Hemiplegia non-dominant side   OT Problem List: Decreased strength;Decreased range of motion;Decreased activity tolerance;Impaired balance (sitting and/or standing);Impaired vision/perception;Decreased coordination;Decreased cognition;Decreased safety awareness;Decreased knowledge of use of DME or AE;Decreased knowledge of precautions;Impaired tone;Impaired UE functional use;Obesity   OT Treatment/Interventions: Self-care/ADL training;Neuromuscular education;DME and/or AE instruction;Therapeutic activities;Cognitive remediation/compensation;Visual/perceptual remediation/compensation;Patient/family education;Balance training    OT Goals(Current goals can be found in the care plan section) Acute Rehab OT Goals Patient Stated Goal: to get better - per daughter  OT Goal Formulation: With patient/family Time For Goal Achievement: 03/04/16 Potential to Achieve Goals: Good ADL Goals Pt Will Perform Grooming: with mod assist;sitting Pt Will Perform Upper Body Bathing: with mod assist;sitting Pt Will Transfer to Toilet: with mod assist;stand pivot transfer;bedside commode Pt/caregiver will Perform Home Exercise Program: Increased ROM;Left upper extremity;With minimal assist;With written HEP provided Additional ADL Goal #1: Pt will use Lt UE as an active assist 50% of time during ADLs  Additional ADL Goal #2: Pt will maintain sustained attention to simple ADL taks with min cues x 5 mins   OT Frequency: Min 2X/week   Barriers to D/C:            Co-evaluation PT/OT/SLP Co-Evaluation/Treatment: Yes Reason for Co-Treatment: For patient/therapist safety   OT goals addressed during session: ADL's and self-care;Strengthening/ROM      End of  Session Nurse Communication: Mobility status  Activity Tolerance: Patient limited by fatigue Patient left: in bed;with call bell/phone within reach;with bed alarm set;with family/visitor present   Time:1112-1136  OT Time  Calculation (min): 24 min Charges:  OT General Charges $OT Visit: 1 Procedure OT Evaluation $OT Eval Moderate Complexity: 1 Procedure G-Codes:    Dantonio Justen M 07-Mar-2016, 12:56 PM

## 2016-02-19 NOTE — Progress Notes (Signed)
No acute events AVSS Awake and alert but non-verbal Right hemiparesis Purposeful on left Bandage c/d/i Stable Will consult PCCM for medical management Continue observation in ICU

## 2016-02-19 NOTE — Consult Note (Signed)
PULMONARY / CRITICAL CARE MEDICINE   Name: Carolyn [name] MRN: [id number] DOB: 1933/09/02    ADMISSION DATE:  02/17/2016 CONSULTATION DATE:  4/5  REFERRING MD:  NS  CHIEF COMPLAINT:  Post crani x 3  HISTORY OF PRESENT ILLNESS:   62 female with chronic left SDH , HTN who required left craniotomy 3/23 for evacuation of Left  SDH and was extubated and did well ans was discharged to [facility] 3/29. While on rehab she developed decrease LOC and required evacuation of recurrent left SDH and readmitted to [facility] hospital. On 4/4 she again had decreased LOC and required craniotomy again with evacuation of left SDH and epidural hematoma. She is extubated and has right hemiplegia but is stable at the time of  this consult 4/5 . PCCM will assist with medical management.  PAST MEDICAL HISTORY :  She  has a past medical history of Diabetes mellitus; Hypertension; Hypercholesterolemia; Gallstones; Glaucoma; Cataracts, bilateral; COPD (chronic obstructive pulmonary disease) ([place]); Hypothyroid; PONV (postoperative nausea and vomiting); Asthma; Chronic kidney disease (2007); and Subdural bleeding ([place]).  PAST SURGICAL HISTORY: She  has past surgical history that includes Dilation and curettage of uterus; Partial nephrectomy; Breast biopsy; D&C for enlarged uterine (210); Total abdominal hysterectomy w/ bilateral salpingoophorectomy (2011); Cateract extractions and IOL; Cholecystectomy (2008); Abdominal hysterectomy; Eye surgery (08/2011,09/2012); Hernia repair (01/19/12); Inguinal hernia repair (01/19/2012); Craniotomy (Left, 02/06/2016); and Craniotomy (N/A, 02/17/2016).  Allergies  Allergen Reactions  . Darvocet [Propoxyphene N-Acetaminophen] Nausea And Vomiting  . Metformin And Related Diarrhea  . Percocet [Oxycodone-Acetaminophen] Nausea And Vomiting  . Vicodin [Hydrocodone-Acetaminophen] Itching    No current facility-administered medications on file prior to encounter.   Current Outpatient Prescriptions on File  Prior to Encounter  Medication Sig  . Fluticasone-Salmeterol (ADVAIR) 250-50 MCG/DOSE AEPB Inhale 1 puff into the lungs 2 (two) times daily as needed (for wheezing/shortness of breath).   [name] glimepiride (AMARYL) 2 MG tablet Take 4 mg by mouth at bedtime.   [name] latanoprost (XALATAN) 0.005 % ophthalmic solution Place 1 drop into both eyes at bedtime.   . levETIRAcetam (KEPPRA) 1000 MG tablet Take 1 tablet (1,000 mg total) by mouth 2 (two) times daily.  [name] lisinopril (PRINIVIL,ZESTRIL) 20 MG tablet Take 20 mg by mouth at bedtime.   . pirbuterol (MAXAIR) 200 MCG/INH inhaler Inhale 2 puffs into the lungs 4 (four) times daily as needed for wheezing or shortness of breath.   . polyethylene glycol (MIRALAX / GLYCOLAX) packet Take 17 g by mouth daily as needed for mild constipation.   . theophylline (UNIPHYL) 400 MG 24 hr tablet Take 400 mg by mouth at bedtime.   [name] thyroid (ARMOUR) 60 MG tablet Take 60 mg by mouth at bedtime.   . verapamil (CALAN-SR) 180 MG CR tablet Take 180 mg by mouth at bedtime.    FAMILY HISTORY:  Her indicated that her mother is deceased. She indicated that her father is deceased. She indicated that only one of her two sisters is alive.   SOCIAL HISTORY: She  reports that she quit smoking about 34 years ago. Her smoking use included Cigarettes. She has a 5 pack-year smoking history. She has never used smokeless tobacco. She reports that she does not drink alcohol or use illicit drugs.  REVIEW OF SYSTEMS:   10 point review of system taken, please see HPI for positives and negatives.   SUBJECTIVE:  nad  VITAL SIGNS: BP 164/78 mmHg  Pulse 106  Temp(Src) 98.5 F (36.9 C) (Axillary)  Resp 15  Wt 203 lb 14.8 oz (92.5 kg)  SpO2 95%  LMP 03/24/2010  HEMODYNAMICS:    VENTILATOR SETTINGS:    INTAKE / OUTPUT: I/O last 3 completed shifts: In: 5105 [I.V.:4675; IV Piggyback:430] Out: 2400 [Urine:2300; Blood:100]  PHYSICAL EXAMINATION: General:  Awake female in NAD   Neuro: Right hemiplegia, follows commands HEENT: Left head dressing intact Cardiovascular:HSR RRR Lungs: Diminished in bases Abdomen:  Soft + BS Musculoskeletal:  INTACT Skin:  WARM AND DRY  LABS:  BMET  Recent Labs Lab 02/13/16 0426 02/15/16 1406 02/17/16 1347 02/18/16 0424 02/18/16 0604  NA 142 136 141 140 138  K 3.2* 3.8 3.5 3.3* 3.2*  CL 106 105  --  106  --   CO2 27 22  --  21*  --   BUN 9 7  --  7  --   CREATININE 0.76 0.63  --  0.82  --   GLUCOSE 85 132* 111* 259* 254*    Electrolytes  Recent Labs Lab 02/13/16 0426 02/15/16 1406 02/18/16 0424  CALCIUM 9.6 9.8 9.8    CBC  Recent Labs Lab 02/13/16 0426 02/15/16 1351 02/17/16 1347 02/18/16 0424 02/18/16 0604  WBC 8.2 9.3  --  16.1*  --   HGB 12.0 12.8 12.2 11.8* 10.5*  HCT 36.5 37.9 36.0 35.6* 31.0*  PLT 214 225  --  217  --     Coag's No results for input(s): APTT, INR in the last 168 hours.  Sepsis Markers No results for input(s): LATICACIDVEN, PROCALCITON, O2SATVEN in the last 168 hours.  ABG No results for input(s): PHART, PCO2ART, PO2ART in the last 168 hours.  Liver Enzymes  Recent Labs Lab 02/13/16 0426  AST 12*  ALT 9*  ALKPHOS 63  BILITOT 0.8  ALBUMIN 2.9*    Cardiac Enzymes No results for input(s): TROPONINI, PROBNP in the last 168 hours.  Glucose  Recent Labs Lab 02/17/16 2323 02/18/16 0716 02/18/16 1211 02/18/16 1843 02/19/16 0226 02/19/16 0603  GLUCAP 289* 164* 140* 168* 153* 119*    Imaging Ct Head Wo Contrast  02/18/2016  CLINICAL DATA:  82 year old female status post evacuation of left subdural hematoma. Initial encounter. EXAM: CT HEAD WITHOUT CONTRAST TECHNIQUE: Contiguous axial images were obtained from the base of the skull through the vertex without intravenous contrast. COMPARISON:  0102 hours today and earlier. FINDINGS: Left superior scalp skin staples now in place. Underlying scalp hematoma and scattered subcutaneous gas has mildly progressed.  Hematoma and gas tracks inferiorly to the lateral left face. Orbits soft tissues appear stable and negative. Stable paranasal sinuses with opacified frontal and ethmoid sinuses, as well as partial opacification of the left maxillary sinus, with hyperdense material. Tympanic cavities and mastoids remain clear. Sequelae of left side craniotomy. Stable visualized osseous structures. Underlying mixed density left subdural hematoma appears to regressed along with rightward midline shift which has decreased to 5 mm (previously 9 mm). There is an increased component of hyperdense blood along the interhemispheric fissure and right tentorium, but the left peripheral subdural collection now measures no greater than 6-8 mm in thickness at most levels (previously up to 14 mm). Volume of pneumocephalus has not significantly changed. There is a 2-3 cm area of cytotoxic edema now in the left frontal lobe (series 2, image 22) without significant regional mass effect. No definite intra-axial hemorrhage. Stable gray-white matter differentiation elsewhere. Persistent mass effect on the left lateral ventricle with no ventriculomegaly. Basilar cisterns remain patent. No intraventricular hemorrhage. IMPRESSION: 1. Left frontal lobe 2-3  cm area of cytotoxic edema (image 22) compatible with small acute/evolving left MCA territory infarct, as was questioned on today earlier CT. No parenchymal hemorrhage or significant associated mass effect. 2. Interval regression of the mixed density left subdural hematoma. Decreased rightward midline shift, now 5 mm. 3. No other new intracranial abnormality. Electronically Signed   By: Genevie Ann M.D.   On: 02/18/2016 15:06     STUDIES:    CULTURES:   ANTIBIOTICS:   SIGNIFICANT EVENTS: 3/23 evac of left sdh 4/3 Evac of pf recurrent left SDH 4/4 evacuaton of left recurrent SDH/EDH    LINES/TUBES:   DISCUSSION: 82 yo post crani x 3. Multiple medical issues and PCCM asked to assist with  her care.  ASSESSMENT / PLAN:  PULMONARY A: COPD P:   O2 as needed BD's as needed  CARDIOVASCULAR A:  HTN P:  Antihypertensives  RENAL  A:   No acute issue P:     GASTROINTESTINAL A:   GI protection P:   PPI  HEMATOLOGIC A:   No acute issues P:  Follow H/H  INFECTIOUS A:   No acute infection P:   Follow temp curve  ENDOCRINE A:   DM  P:   SSI  NEUROLOGIC A:   Post Left crani x 3 for SDH/EDH last on 4/4 Szs P:   RASS goal: 0 Awake and alert with right hemiplegia Keppra   FAMILY  - Updates: Daughter updated at bedside  - Inter-disciplinary family meet or Palliative Care meeting due by:  day 7   Steve Minor ACNP Maryanna Shape PCCM Pager [phone] till 3 pm If no answer page [phone] 02/19/2016, 11:55 AM  Attending Note:  82 year old female, s/p crani x3 for SDH who has flaccid paralysis on the right an crani scar on the left and is aphasic who continues to have fluctuant HR and rhythm.  Desaturation also noted.  Airway protection is questionable at best.  On exam, lungs are clear to auscultation.  I reviewed head CT myself, left frontal cytotoxic edema noted.  Spoke with family, their primary concern is BP and HR right now but I am very concern about airway protection.  No BiPAP if desaturates, straight to intubation if that is the issue.  In the meantime, avoid hydralazine as can cause reflex tachycardia.  Scheduled lopressor as ordered with holding parameters.  Monitor airway protection.  No need for abx at this time.  If BP remains elevated then will need a labetalol drip for BP control as that will control BP and HR.  PCCM will follow.  Hold in the ICU for airway protection monitoring and neuro condition.  The patient is critically ill with multiple organ systems failure and requires high complexity decision making for assessment and support, frequent evaluation and titration of therapies, application of advanced monitoring technologies and extensive  interpretation of multiple databases.   Critical Care Time devoted to patient care services described in this note is  35  Minutes. This time reflects time of care of this signee Dr Jennet Maduro. This critical care time does not reflect procedure time, or teaching time or supervisory time of PA/NP/Med student/Med Resident etc but could involve care discussion time.  Rush Farmer, M.D. [facility] Pulmonary/Critical Care Medicine. Pager: [phone]. After hours pager: [phone].

## 2016-02-20 ENCOUNTER — Inpatient Hospital Stay (HOSPITAL_COMMUNITY): Payer: PPO

## 2016-02-20 DIAGNOSIS — R4182 Altered mental status, unspecified: Secondary | ICD-10-CM | POA: Diagnosis not present

## 2016-02-20 DIAGNOSIS — Z9889 Other specified postprocedural states: Secondary | ICD-10-CM

## 2016-02-20 DIAGNOSIS — R569 Unspecified convulsions: Secondary | ICD-10-CM | POA: Diagnosis not present

## 2016-02-20 DIAGNOSIS — I6200 Nontraumatic subdural hemorrhage, unspecified: Secondary | ICD-10-CM | POA: Diagnosis not present

## 2016-02-20 DIAGNOSIS — G8110 Spastic hemiplegia affecting unspecified side: Secondary | ICD-10-CM

## 2016-02-20 DIAGNOSIS — E038 Other specified hypothyroidism: Secondary | ICD-10-CM

## 2016-02-20 DIAGNOSIS — J438 Other emphysema: Secondary | ICD-10-CM | POA: Diagnosis not present

## 2016-02-20 DIAGNOSIS — E119 Type 2 diabetes mellitus without complications: Secondary | ICD-10-CM | POA: Diagnosis not present

## 2016-02-20 DIAGNOSIS — I10 Essential (primary) hypertension: Secondary | ICD-10-CM | POA: Diagnosis not present

## 2016-02-20 DIAGNOSIS — J9811 Atelectasis: Secondary | ICD-10-CM | POA: Diagnosis not present

## 2016-02-20 DIAGNOSIS — Z4682 Encounter for fitting and adjustment of non-vascular catheter: Secondary | ICD-10-CM | POA: Diagnosis not present

## 2016-02-20 LAB — BASIC METABOLIC PANEL
Anion gap: 9 (ref 5–15)
BUN: 5 mg/dL — ABNORMAL LOW (ref 6–20)
CO2: 22 mmol/L (ref 22–32)
Calcium: 9 mg/dL (ref 8.9–10.3)
Chloride: 110 mmol/L (ref 101–111)
Creatinine, Ser: 0.57 mg/dL (ref 0.44–1.00)
GFR calc Af Amer: >60 mL/min (ref >60)
GFR calc non Af Amer: >60 mL/min (ref >60)
Glucose, Bld: 131 mg/dL — ABNORMAL HIGH (ref 65–99)
Potassium: 3 mmol/L — ABNORMAL LOW (ref 3.5–5.1)
Sodium: 141 mmol/L (ref 135–145)

## 2016-02-20 LAB — CBC
HCT: 29.3 % — ABNORMAL LOW (ref 36.0–46.0)
Hemoglobin: 9.3 g/dL — ABNORMAL LOW (ref 12.0–15.0)
MCH: 27.7 pg (ref 26.0–34.0)
MCHC: 31.7 g/dL (ref 30.0–36.0)
MCV: 87.2 fL (ref 78.0–100.0)
Platelets: 180 10*3/uL (ref 150–400)
RBC: 3.36 MIL/uL — ABNORMAL LOW (ref 3.87–5.11)
RDW: 14.8 % (ref 11.5–15.5)
WBC: 11.4 10*3/uL — ABNORMAL HIGH (ref 4.0–10.5)

## 2016-02-20 LAB — GLUCOSE, CAPILLARY
Glucose-Capillary: 130 mg/dL — ABNORMAL HIGH (ref 65–99)
Glucose-Capillary: 114 mg/dL — ABNORMAL HIGH (ref 65–99)
Glucose-Capillary: 147 mg/dL — ABNORMAL HIGH (ref 65–99)
Glucose-Capillary: 112 mg/dL — ABNORMAL HIGH (ref 65–99)

## 2016-02-20 LAB — MAGNESIUM: Magnesium: 1.7 mg/dL (ref 1.7–2.4)

## 2016-02-20 LAB — PHOSPHORUS: Phosphorus: 1.3 mg/dL — ABNORMAL LOW (ref 2.5–4.6)

## 2016-02-20 MED ORDER — VITAL HIGH PROTEIN PO LIQD: 1000.0000 mL | ORAL | Status: DC

## 2016-02-20 MED ORDER — JEVITY 1.2 CAL PO LIQD: 1000.0000 mL | ORAL | Status: DC

## 2016-02-20 MED ORDER — JEVITY 1.2 CAL PO LIQD
1000.0000 mL | ORAL | Status: DC
  Administered 2016-02-20 – 2016-02-25 (×5): 1000 mL
  Filled 2016-02-20 (×11): qty 1000

## 2016-02-20 MED ORDER — MAGNESIUM SULFATE 50 % IJ SOLN
4.0000 g | Freq: Once | INTRAVENOUS | Status: DC
  Filled 2016-02-20: qty 8

## 2016-02-20 MED ORDER — POTASSIUM PHOSPHATES 15 MMOLE/5ML IV SOLN
30.0000 mmol | Freq: Once | INTRAVENOUS | Status: AC
  Administered 2016-02-20: 30 mmol via INTRAVENOUS
  Filled 2016-02-20: qty 10

## 2016-02-20 MED ORDER — MAGNESIUM SULFATE 4 GM/100ML IV SOLN
4.0000 g | Freq: Once | INTRAVENOUS | Status: AC
  Administered 2016-02-20: 4 g via INTRAVENOUS
  Filled 2016-02-20: qty 100

## 2016-02-20 MED ORDER — POTASSIUM CHLORIDE 10 MEQ/100ML IV SOLN
10.0000 meq | INTRAVENOUS | Status: AC
  Administered 2016-02-20 (×4): 10 meq via INTRAVENOUS
  Filled 2016-02-20 (×4): qty 100

## 2016-02-20 NOTE — Consult Note (Addendum)
PULMONARY / CRITICAL CARE MEDICINE   Name: Carolyn [name] MRN: [id number] DOB: 10-Jun-1933    ADMISSION DATE:  02/17/2016 CONSULTATION DATE:  4/5  REFERRING MD:  NS  CHIEF COMPLAINT:  Post crani x 3  HISTORY OF PRESENT ILLNESS:   82 female with chronic left SDH , HTN who required left craniotomy 3/23 for evacuation of Left  SDH and was extubated and did well ans was discharged to [facility] 3/29. While on rehab she developed decrease LOC and required evacuation of recurrent left SDH and readmitted to [facility] hospital. On 4/4 she again had decreased LOC and required craniotomy again with evacuation of left SDH and epidural hematoma. She is extubated and has right hemiplegia but is stable at the time of  this consult 4/5 . PCCM will assist with medical management.  SUBJECTIVE:  No distress, HR better controlled, airway protection improved  VITAL SIGNS: BP 155/73 mmHg  Pulse 72  Temp(Src) 97.6 F (36.4 C) (Axillary)  Resp 21  Wt 92.5 kg (203 lb 14.8 oz)  SpO2 95%  LMP 03/24/2010  HEMODYNAMICS:    VENTILATOR SETTINGS:    INTAKE / OUTPUT: I/O last 3 completed shifts: In: 4005 [I.V.:3675; IV Piggyback:330] Out: 1875 [Urine:1875]  PHYSICAL EXAMINATION: General:  Awake female in NAD  Neuro: Right hemiplegia, follows commands HEENT: Left head dressing intact Cardiovascular:HSR RRR Lungs: Diminished in bases Abdomen:  Soft + BS Musculoskeletal:  INTACT Skin:  WARM AND DRY  LABS:  BMET  Recent Labs Lab 02/15/16 1406  02/18/16 0424 02/18/16 0604 02/20/16 0258  NA 136  < > 140 138 141  K 3.8  < > 3.3* 3.2* 3.0*  CL 105  --  106  --  110  CO2 22  --  21*  --  22  BUN 7  --  7  --  5*  CREATININE 0.63  --  0.82  --  0.57  GLUCOSE 132*  < > 259* 254* 131*  < > = values in this interval not displayed.  Electrolytes  Recent Labs Lab 02/15/16 1406 02/18/16 0424 02/20/16 0258  CALCIUM 9.8 9.8 9.0  MG  --   --  1.7  PHOS  --   --  1.3*    CBC  Recent Labs Lab  02/15/16 1351  02/18/16 0424 02/18/16 0604 02/20/16 0258  WBC 9.3  --  16.1*  --  11.4*  HGB 12.8  < > 11.8* 10.5* 9.3*  HCT 37.9  < > 35.6* 31.0* 29.3*  PLT 225  --  217  --  180  < > = values in this interval not displayed.  Coag's No results for input(s): APTT, INR in the last 168 hours.  Sepsis Markers No results for input(s): LATICACIDVEN, PROCALCITON, O2SATVEN in the last 168 hours.  ABG No results for input(s): PHART, PCO2ART, PO2ART in the last 168 hours.  Liver Enzymes No results for input(s): AST, ALT, ALKPHOS, BILITOT, ALBUMIN in the last 168 hours.  Cardiac Enzymes No results for input(s): TROPONINI, PROBNP in the last 168 hours.  Glucose  Recent Labs Lab 02/19/16 0226 02/19/16 0603 02/19/16 1201 02/19/16 1849 02/19/16 2315 02/20/16 0528  GLUCAP 153* 119* 139* 137* 104* 130*    Imaging No results found.  STUDIES:    CULTURES:   ANTIBIOTICS:  SIGNIFICANT EVENTS: 3/23 evac of left sdh 4/3 Evac of pf recurrent left SDH 4/4 evacuaton of left recurrent SDH/EDH 4/5- more awake  LINES/TUBES:  DISCUSSION: 82 yo post crani x 3. Multiple  medical issues and PCCM asked to assist with her care.  ASSESSMENT / PLAN:  PULMONARY A: COPD At risk atx P:   O2 as needed BD's as needed IS when able Assess pcxr for atx  CARDIOVASCULAR A:  HTN RBBB old Tachy improved- at risk MAT P:  Metoprolol, maintain IV, change to oral when able, 25 q12h Tele Assess Pa pressures with echo RBBB Dc theophylline, get level  RENAL  A:   Hypokalemia Mild hyperchloremia hypophos hyponmag P:   K, phos, mag supp Consider kvo  GASTROINTESTINAL A:   GI protection R./o dysphagia P:   PPI slp on going  HEMATOLOGIC A:   No acute issues P:  Follow H/H dvt prevention, - scd  INFECTIOUS A:   No acute infection P:   Follow temp curve  ENDOCRINE A:   DM -controlled P:   SSI  NEUROLOGIC A:   Post Left crani x 3 for SDH/EDH last on  4/4 Szs P:   RASS goal: 0 Awake and alert with right hemiplegia Keppra eeg on order   FAMILY  - Updates: none  - Inter-disciplinary family meet or Palliative Care meeting due by:  day 7   Lavon Paganini. Titus Mould, MD, [place] Pgr: [place] Pul[facility] & Critical Care

## 2016-02-20 NOTE — Progress Notes (Signed)
No acute events AVSS More awake and alert Still non-verbal More purposeful movement today Starting to move right side more Stable/improving Will obtain routine EEG to make sure there's no subclinical seizure activity happening Continue supportive care

## 2016-02-20 NOTE — Progress Notes (Signed)
Was unable to insert NGT due to gagging, coughing and patient distress. Cortrack team notified.

## 2016-02-20 NOTE — Progress Notes (Signed)
EEG Completed; Results Pending  

## 2016-02-20 NOTE — Procedures (Signed)
CORTRAK FEEDING TUBE PLACEMENT  Person Inserting Tube:  DESAI, RAHUL P Tube Type:  Cortrak - 55 inches Tube Location:  Left nare Initial Placement:  Postpyloric Cortrak Total Procedure Time:  15 Cortrak Secured At:  85 cm   Montey Hora, [place] [facility] [place] Pulmonary & Critical Care Medicine Pager: [phone]  or [phone] 02/20/2016, 3:17 PM  Tolerated well  Lavon Paganini. Titus Mould, MD, [place] Pgr: [place] [facility] Critical [facility] 02/21/2016 12:41 PM

## 2016-02-20 NOTE — Evaluation (Addendum)
Clinical/Bedside Swallow Evaluation Patient Details  Name: Carolyn [name] MRN: [id number] Date of Birth: 1933/05/07  Today's Date: 02/20/2016 Time: SLP Start Time (ACUTE ONLY): 0846 SLP Stop Time (ACUTE ONLY): 0907 SLP Time Calculation (min) (ACUTE ONLY): 21 min  Past Medical History:  Past Medical History  Diagnosis Date  . Diabetes mellitus   . Hypertension   . Hypercholesterolemia   . Gallstones   . Glaucoma   . Cataracts, bilateral   . COPD (chronic obstructive pulmonary disease) ([place])   . Hypothyroid   . PONV (postoperative nausea and vomiting)   . Asthma   . Chronic kidney disease 2007    kidney cancer and partial nephrctomy-right  . Subdural bleeding [facility])    Past Surgical History:  Past Surgical History  Procedure Laterality Date  . Dilation and curettage of uterus    . Partial nephrectomy      cancer  . Breast biopsy    . D&c for enlarged uterine  210  . Total abdominal hysterectomy w/ bilateral salpingoophorectomy  2011  . Cateract extractions and iol    . Cholecystectomy  2008  . Abdominal hysterectomy    . Eye surgery  08/2011,09/2012    bilateral cataracts  . Hernia repair  01/19/12    inguinal  . Inguinal hernia repair  01/19/2012    Procedure: HERNIA REPAIR INGUINAL ADULT;  Surgeon: Earnstine Regal, MD;  Location: [facility] ORS;  Service: General;  Laterality: Left;  repair left inguinal hernia with mesh   . Craniotomy Left 02/06/2016    Procedure: CRANIOTOMY HEMATOMA EVACUATION SUBDURAL;  Surgeon: Kevan Ny Ditty, MD;  Location: [facility] NEURO ORS;  Service: Neurosurgery;  Laterality: Left;  . Craniotomy N/A 02/17/2016    Procedure: CRANIOTOMY HEMATOMA EVACUATION SUBDURAL;  Surgeon: Kevan Ny Ditty, MD;  Location: [employer] NEURO ORS;  Service: Neurosurgery;  Laterality: N/A;  left  . Craniotomy Left 02/18/2016    Procedure: CRANIOTOMY HEMATOMA EVACUATION SUBDURAL;  Surgeon: Earnie Larsson, MD;  Location: [place] NEURO ORS;  Service: Neurosurgery;  Laterality: Left;   HPI:  82 y.o.  female with h/o DM, HTN, COPD, asthma and chronic kidney disease, who underwent craniotomy x2 for evacuation of chronic SDH parieto-occipital subdural hematoma. Pt was previously admitted 3/6 after fall and was d/c home next day. Pt was readmitted 3/22 due to headaches and neuro changes, then d/c to CIR 3/29. Pt currently admitted from CIR due to neurological decline. Pt has been receiving SLP intervention while in house, focusing on comprehension, expression, social interaction, problem solving and memory. CT Head 4/4 L frontal lobe 2-3 cm area of cytotoxic edema compatible with small acute/evolving L MCA territory infarct. CXR 4/1 no active cardiopulmonary disease.   Assessment / Plan / Recommendation Clinical Impression  Reduced labial seal and poor awareness of bolus resulted in anterior spillage on L and R side, oral holding and prolonged oral transit. Oral deficits noted likely due to oral apraxia and cognitive impairments. Decreased hyolaryngeal excursion noted on palpation with multiple swallows observed during intake of puree (indicative of pharyngeal residue). No attempts to clear throat or cough were observed, concerning for possible silent penetration/aspiration. Pt educated re: continued NPO status. SLP will continue to follow to determine PO readiness and appropriateness for instrumental testing.    Aspiration Risk  Severe aspiration risk    Diet Recommendation NPO   Medication Administration: Via alternative means    Other  Recommendations Oral Care Recommendations: Oral care QID Other Recommendations: Have oral suction available   Follow  up Recommendations   (TBD)    Frequency and Duration min 2x/week  2 weeks       Prognosis Prognosis for Safe Diet Advancement: Fair Barriers to Reach Goals: Cognitive deficits;Severity of deficits      Swallow Study   General HPI: 82 y.o. female with h/o DM, HTN, COPD, asthma and chronic kidney disease, who underwent craniotomy x2 for  evacuation of chronic SDH parieto-occipital subdural hematoma. Pt was previously admitted 3/6 after fall and was d/c home next day. Pt was readmitted 3/22 due to headaches and neuro changes, then d/c to CIR 3/29. Pt currently admitted from CIR due to neurological decline. Pt has been receiving SLP intervention while in house, focusing on comprehension, expression, social interaction, problem solving and memory. CT Head 4/4 L frontal lobe 2-3 cm area of cytotoxic edema compatible with small acute/evolving L MCA territory infarct. CXR 4/1 no active cardiopulmonary disease. Type of Study: Bedside Swallow Evaluation Previous Swallow Assessment: none found Diet Prior to this Study: NPO Temperature Spikes Noted: No Respiratory Status: Nasal cannula History of Recent Intubation: No Behavior/Cognition: Alert;Cooperative;Impulsive;Distractible;Requires cueing Oral Cavity Assessment: Within Functional Limits Oral Care Completed by SLP: Yes Oral Cavity - Dentition: Adequate natural dentition Vision: Functional for self-feeding Self-Feeding Abilities: Total assist Patient Positioning: Upright in bed Baseline Vocal Quality: Normal (only observed x2) Volitional Cough: Cognitively unable to elicit Volitional Swallow: Unable to elicit    Oral/Motor/Sensory Function Overall Oral Motor/Sensory Function: Within functional limits (difficult to assess due to apraxia)   Ice Chips Ice chips: Impaired Presentation: Spoon Oral Phase Impairments: Poor awareness of bolus Oral Phase Functional Implications: Oral holding;Prolonged oral transit Pharyngeal Phase Impairments: Decreased hyoid-laryngeal movement   Thin Liquid Thin Liquid: Impaired Presentation: Cup;Spoon Oral Phase Impairments: Reduced labial seal;Poor awareness of bolus Oral Phase Functional Implications: Oral holding;Prolonged oral transit;Right anterior spillage;Left anterior spillage Pharyngeal  Phase Impairments: Decreased hyoid-laryngeal  movement;Suspected delayed Swallow    Nectar Thick Nectar Thick Liquid: Not tested   Honey Thick Honey Thick Liquid: Not tested   Puree Puree: Impaired Presentation: Spoon Oral Phase Impairments: Reduced labial seal;Reduced lingual movement/coordination;Poor awareness of bolus Oral Phase Functional Implications: Oral holding;Oral residue;Prolonged oral transit Pharyngeal Phase Impairments: Multiple swallows   Solid   Olen Eaves, Student-SLP  Solid: Not tested        Titus Mould 02/20/2016,9:41 AM

## 2016-02-20 NOTE — Progress Notes (Signed)
Initial Nutrition Assessment  DOCUMENTATION CODES:   Obesity unspecified  INTERVENTION:   Initiate Jevity 1.2 @ 20 ml/hr via Cortrak tube and increase by 10 ml every 4 hours to goal rate of 55 ml/hr.   Tube feeding regimen provides 1584 kcal, 73 grams of protein, and 1069 ml of H2O.   NUTRITION DIAGNOSIS:   Inadequate oral intake related to inability to eat as evidenced by NPO status.  GOAL:   Patient will meet greater than or equal to 90% of their needs  MONITOR:   Diet advancement, Labs, Weight trends, TF tolerance  REASON FOR ASSESSMENT:   Consult Enteral/tube feeding initiation and management  ASSESSMENT:   54 female with chronic left SDH , HTN who required left craniotomy 3/23 for evacuation of Left SDH and was extubated and did well ans was discharged to [facility] 3/29. While on rehab she developed decrease LOC and required evacuation of recurrent left SDH and readmitted to [facility] hospital. On 4/4 she again had decreased LOC and required craniotomy again with evacuation of left SDH and epidural hematoma. She is extubated and has right hemiplegia   Medications reviewed and include: colace, amaryl, potassium phosphate, senna Labs reviewed: potassium low 3.0, phosphorus low 1.3 CBG's: 104-137 Pt discussed during ICU rounds and with RN.  Nutrition-Focused physical exam completed. Findings are no fat depletion, no muscle depletion, and mild edema.    Diet Order:  Diet NPO time specified  Skin:  Reviewed, no issues  Last BM:  4/2  Height:   Ht Readings from Last 1 Encounters:  02/12/16 5\' 6"  (1.676 m)    Weight:   Wt Readings from Last 1 Encounters:  02/19/16 203 lb 14.8 oz (92.5 kg)    Ideal Body Weight:  59 kg  BMI:  Body mass index is 32.93 kg/(m^2).  Estimated Nutritional Needs:   Kcal:  1500-1700  Protein:  75-85 grams  Fluid:  > 1.6 L/day  EDUCATION NEEDS:   No education needs identified at this time  [place], [place], [place]  Pager [phone] After Hours Pager

## 2016-02-20 NOTE — Evaluation (Signed)
Speech Language Pathology Evaluation Patient Details Name: Carolyn [name] MRN: [id number] DOB: 1933-05-09 Today's Date: 02/20/2016 Time: [id number] SLP Time Calculation (min) (ACUTE ONLY): 21 min  Problem List:  Patient Active Problem List   Diagnosis Date Noted  . Atelectasis   . Spastic hemiplegia affecting right side 02/19/2016  . S/P craniotomy 02/17/2016  . Traumatic subdural hematoma ([place]) 02/12/2016  . Headache as late effect of brain injury ([place])   . Seizure ([place])   . Difficulty controlling behavior as late effect of traumatic brain injury ([place])   . 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 Memory dysfunction as late effect of traumatic brain injury ([place])   . Benign essential HTN   . DM type 2 with diabetic peripheral neuropathy ([place])   . Thyroid activity decreased   . TBI (traumatic brain injury) ([place])   . Cognitive deficit as late effect of traumatic brain injury ([place])   . Falls frequently   . Controlled type 2 diabetes mellitus with complication, without long-term current use of insulin ([place])   . Tachycardia   . Chronic obstructive pulmonary disease ([place])   . Subdural hematoma ([place]) 02/05/2016  . Chronic bronchitis ([place])   . SDH (subdural hematoma) ([place]) 01/20/2016  . Fall 01/20/2016  . Closed fracture of left elbow 01/20/2016  . Diabetes mellitus without complication ([place])   . Hypercholesterolemia   . COPD (chronic obstructive pulmonary disease) ([place])   . Hypothyroid   . Essential hypertension   . Inguinal hernia unilateral, non-recurrent, left 12/02/2011   Past Medical History:  Past Medical History  Diagnosis Date  . Diabetes mellitus   . Hypertension   . Hypercholesterolemia   . Gallstones   . Glaucoma   . Cataracts, bilateral   . COPD (chronic obstructive pulmonary disease) ([place])   . Hypothyroid   . PONV (postoperative nausea and vomiting)   . Asthma   . Chronic kidney disease 2007     kidney cancer and partial nephrctomy-right  . Subdural bleeding [facility])    Past Surgical History:  Past Surgical History  Procedure Laterality Date  . Dilation and curettage of uterus    . Partial nephrectomy      cancer  . Breast biopsy    . D&c for enlarged uterine  210  . Total abdominal hysterectomy w/ bilateral salpingoophorectomy  2011  . Cateract extractions and iol    . Cholecystectomy  2008  . Abdominal hysterectomy    . Eye surgery  08/2011,09/2012    bilateral cataracts  . Hernia repair  01/19/12    inguinal  . Inguinal hernia repair  01/19/2012    Procedure: HERNIA REPAIR INGUINAL ADULT;  Surgeon: Earnstine Regal, MD;  Location: [facility] ORS;  Service: General;  Laterality: Left;  repair left inguinal hernia with mesh   . Craniotomy Left 02/06/2016    Procedure: CRANIOTOMY HEMATOMA EVACUATION SUBDURAL;  Surgeon: Kevan Ny Ditty, MD;  Location: [facility];  Service: Neurosurgery;  Laterality: Left;  . Craniotomy N/A 02/17/2016    Procedure: CRANIOTOMY HEMATOMA EVACUATION SUBDURAL;  Surgeon: Kevan Ny Ditty, MD;  Location: [place] [facility]UR[facility] OR[facility];  Service: Neurosurgery;  Laterality: N/A;  left  . Craniotomy Left 02/18/2016    Procedure: CRANIOTOMY HEMATOMA EVACUATION SUBDURAL;  Surgeon: Earnie Larsson, MD;  Location: [place] [facility] OR[facility];  Service: Neurosurgery;  Laterality: Left;   HPI:  82 y.o. female with h/o DM, HTN, COPD, asthma and chronic kidney disease, who underwent  craniotomy x2 for evacuation of chronic SDH parieto-occipital subdural hematoma. Pt was previously admitted 3/6 after fall and was d/c home next day. Pt was readmitted 3/22 due to headaches and neuro changes, then d/c to CIR 3/29. Pt currently admitted from CIR due to neurological decline. Pt has been receiving SLP intervention while in house, focusing on comprehension, expression, social interaction, problem solving and memory. CT Head 4/4 L frontal lobe 2-3 cm area of cytotoxic edema compatible with small acute/evolving L MCA  territory infarct. CXR 4/1 no active cardiopulmonary disease.   Assessment / Plan / Recommendation Clinical Impression  SLP evaluated pt's speech, language and cognition at bedside to determine need for continued intervention.  Pt exhibited verbal apraxia requiring max verbal and visual cues for initiation of vocalization, effective x 2, however no functional expressive communication. Pt able to follow simple one step commands without difficulty and answer simple biographical questions with 100% accuracy via head nod/shake. Cognitive impairments noted in sustained attention deficits (distracted by bed sheets) and decreased emergent awareness of language difficulties. SLP will continue to follow to provide speech, language and cognition tx, focusing on attention, motor speech and verbal expression.    SLP Assessment  Patient needs continued Speech Lanaguage Pathology Services    Follow Up Recommendations   (TBD)    Frequency and Duration min 2x/week  2 weeks      SLP Evaluation Prior Functioning  Cognitive/Linguistic Baseline: Baseline deficits (from initial stroke) Type of Home: House  Lives With: Daughter;Spouse Available Help at Discharge: Family;Available 24 hours/day   Cognition  Overall Cognitive Status: Impaired/Different from baseline Arousal/Alertness: Awake/alert Orientation Level: Other (comment) (oriented to place responding via y/n question) Attention: Focused;Sustained Focused Attention: Appears intact Sustained Attention: Impaired Sustained Attention Impairment: Verbal basic;Functional basic Memory:  (will assess in diagnostic treatment) Awareness: Impaired Awareness Impairment: Emergent impairment Behaviors: Restless;Impulsive Safety/Judgment: Impaired    Comprehension  Auditory Comprehension Overall Auditory Comprehension: Impaired (for moderatly comples) Yes/No Questions: Within Functional Limits Commands: Within Functional Limits (one step) Conversation:  Other (comment) (nonverbal) Interfering Components: Attention;Motor planning;Processing speed EffectiveTechniques: Extra processing time;Pausing;Repetition;Visual/Gestural cues Visual Recognition/Discrimination Discrimination: Not tested Reading Comprehension Reading Status: Not tested    Expression Expression Primary Mode of Expression: Nonverbal - gestures (vocalization x 2) Verbal Expression Overall Verbal Expression: Impaired Initiation: Impaired Automatic Speech: Name;Counting;Singing (no oral movement or phonation) Level of Generative/Spontaneous Verbalization: Word Repetition: Impaired Level of Impairment: Word level Naming: Not tested Pragmatics: Impairment Impairments: Eye contact Interfering Components: Attention Effective Techniques: Melodic intonation Non-Verbal Means of Communication: Not applicable Written Expression Dominant Hand: Right Written Expression: Not tested   Oral / Motor  Oral Motor/Sensory Function Overall Oral Motor/Sensory Function: Within functional limits (difficult to assess due to apraxia) Motor Speech Overall Motor Speech: Impaired Respiration: Within functional limits Phonation: Low vocal intensity Resonance:  (will continue to assess) Articulation:  (will continue to assess in diagnostic treatment) Intelligibility: Intelligibility reduced Word: 0-24% accurate Phrase: Not tested Sentence: Not tested Conversation: Not tested Motor Planning: Impaired Level of Impairment: Word Motor Speech Errors: Unaware;Inconsistent    Lior Hoen Julious Payer, Student-SLP                   Winifred Balogh 02/20/2016, 2:21 PM

## 2016-02-20 NOTE — Progress Notes (Signed)
Pt's right pupil 2 mm larger than left, but awakens easily and moves purposefully. MD Ditty notified.

## 2016-02-20 NOTE — Care Management Important Message (Signed)
Important Message  Patient Details  Name: CLORINE MOHABIR MRN: [id number] Date of Birth: 1933-06-22   Medicare Important Message Given:  Yes    Liann Spaeth Abena 02/20/2016, 11:03 AM

## 2016-02-21 ENCOUNTER — Inpatient Hospital Stay (HOSPITAL_COMMUNITY): Payer: PPO

## 2016-02-21 DIAGNOSIS — S069X0S Unspecified intracranial injury without loss of consciousness, sequela: Secondary | ICD-10-CM

## 2016-02-21 DIAGNOSIS — I6200 Nontraumatic subdural hemorrhage, unspecified: Secondary | ICD-10-CM | POA: Diagnosis not present

## 2016-02-21 DIAGNOSIS — I2789 Other specified pulmonary heart diseases: Secondary | ICD-10-CM

## 2016-02-21 DIAGNOSIS — F068 Other specified mental disorders due to known physiological condition: Secondary | ICD-10-CM

## 2016-02-21 DIAGNOSIS — E118 Type 2 diabetes mellitus with unspecified complications: Secondary | ICD-10-CM | POA: Diagnosis not present

## 2016-02-21 DIAGNOSIS — Z9889 Other specified postprocedural states: Secondary | ICD-10-CM | POA: Diagnosis not present

## 2016-02-21 DIAGNOSIS — I10 Essential (primary) hypertension: Secondary | ICD-10-CM | POA: Diagnosis not present

## 2016-02-21 DIAGNOSIS — J9811 Atelectasis: Secondary | ICD-10-CM | POA: Diagnosis not present

## 2016-02-21 DIAGNOSIS — R4182 Altered mental status, unspecified: Secondary | ICD-10-CM | POA: Insufficient documentation

## 2016-02-21 DIAGNOSIS — R569 Unspecified convulsions: Secondary | ICD-10-CM | POA: Diagnosis not present

## 2016-02-21 LAB — BASIC METABOLIC PANEL
Anion gap: 8 (ref 5–15)
BUN: 6 mg/dL (ref 6–20)
CO2: 26 mmol/L (ref 22–32)
Calcium: 8.6 mg/dL — ABNORMAL LOW (ref 8.9–10.3)
Chloride: 107 mmol/L (ref 101–111)
Creatinine, Ser: 0.47 mg/dL (ref 0.44–1.00)
GFR calc Af Amer: >60 mL/min (ref >60)
GFR calc non Af Amer: >60 mL/min (ref >60)
Glucose, Bld: 153 mg/dL — ABNORMAL HIGH (ref 65–99)
Potassium: 3.3 mmol/L — ABNORMAL LOW (ref 3.5–5.1)
Sodium: 141 mmol/L (ref 135–145)

## 2016-02-21 LAB — PHOSPHORUS: Phosphorus: 1.8 mg/dL — ABNORMAL LOW (ref 2.5–4.6)

## 2016-02-21 LAB — GLUCOSE, CAPILLARY
Glucose-Capillary: 119 mg/dL — ABNORMAL HIGH (ref 65–99)
Glucose-Capillary: 140 mg/dL — ABNORMAL HIGH (ref 65–99)
Glucose-Capillary: 150 mg/dL — ABNORMAL HIGH (ref 65–99)
Glucose-Capillary: 153 mg/dL — ABNORMAL HIGH (ref 65–99)
Glucose-Capillary: 171 mg/dL — ABNORMAL HIGH (ref 65–99)
Glucose-Capillary: 173 mg/dL — ABNORMAL HIGH (ref 65–99)

## 2016-02-21 LAB — ECHOCARDIOGRAM COMPLETE: Weight: 3343.94 oz

## 2016-02-21 LAB — MAGNESIUM: Magnesium: 2.4 mg/dL (ref 1.7–2.4)

## 2016-02-21 MED ORDER — POTASSIUM CHLORIDE CRYS ER 20 MEQ PO TBCR
40.0000 meq | EXTENDED_RELEASE_TABLET | Freq: Once | ORAL | Status: DC
  Filled 2016-02-21: qty 2

## 2016-02-21 MED ORDER — DEXTROSE 5 % IV SOLN
30.0000 mmol | Freq: Once | INTRAVENOUS | Status: DC
  Filled 2016-02-21: qty 10

## 2016-02-21 MED ORDER — POTASSIUM PHOSPHATES 15 MMOLE/5ML IV SOLN
30.0000 mmol | Freq: Once | INTRAVENOUS | Status: AC
  Administered 2016-02-21: 30 mmol via INTRAVENOUS
  Filled 2016-02-21: qty 10

## 2016-02-21 MED ORDER — SODIUM CHLORIDE 0.9 % IV SOLN
1250.0000 mg | Freq: Two times a day (BID) | INTRAVENOUS | Status: DC
  Administered 2016-02-21 – 2016-02-24 (×7): 1250 mg via INTRAVENOUS
  Filled 2016-02-21 (×11): qty 12.5

## 2016-02-21 MED ORDER — POTASSIUM CHLORIDE 20 MEQ/15ML (10%) PO SOLN
20.0000 meq | ORAL | Status: AC
  Administered 2016-02-21 (×2): 20 meq
  Filled 2016-02-21 (×2): qty 15

## 2016-02-21 NOTE — Progress Notes (Signed)
Physical Therapy Treatment Patient Details Name: Carolyn [name] MRN: [id number] DOB: 19-Apr-1933 Today's Date: 02/21/2016    History of Present Illness Pt admitted from CIR due to neurological decline.  He underwent craniotomy x 2 for evacuation of chronic SDH Lt parieto-occipital subdural hemoatoma.   Pt suffered fall 3/6 after fall.  She sustained SDH and was discharged home 3/7.  She was readmitted 02/05/16  due to headaches and neuro changes.  She underwent craniotomy 02/06/16.  She was discharged to [facility] 3./29/17.  PMH includes:  DM, HTN, Glaucoma, COPD, Asthma, Kidney CA.  She sustained Lt radius fracture initial fall 01/20/16.     PT Comments    Pt with better participation today and able to A with coming to sitting EOB and even standing with use of Stedy.  Pt continues to require 2 person A for mobility and safety, but feel pt is making great progress, and should be able to return to CIR for further therapies at D/C.  Will continue to follow.    Follow Up Recommendations  CIR     Equipment Recommendations  None recommended by PT    Recommendations for Other Services       Precautions / Restrictions Precautions Precautions: Fall Restrictions Weight Bearing Restrictions: No    Mobility  Bed Mobility Overal bed mobility: Needs Assistance;+2 for physical assistance Bed Mobility: Supine to Sit     Supine to sit: Mod assist;+2 for physical assistance     General bed mobility comments: pt with increased ability to A with bringing herself to EOB.    Transfers     Transfers: Sit to/from Stand Sit to Stand: Mod assist;+2 physical assistance         General transfer comment: pt particpated with bringing self to standing x2 utilizing stedy.  pt demonstrates good follow through on directions for UE use and placement of LEs.  From stedy seat pt only required MinA to come to standing.    Ambulation/Gait                 Stairs            Wheelchair Mobility     Modified Rankin (Stroke Patients Only)       Balance Overall balance assessment: Needs assistance Sitting-balance support: Bilateral upper extremity supported;Feet supported Sitting balance-Leahy Scale: Poor Sitting balance - Comments: Needs UE support and unable to accept balance challenges without A.                              Cognition Arousal/Alertness: Awake/alert Behavior During Therapy: Flat affect Overall Cognitive Status: Impaired/Different from baseline Area of Impairment: Following commands;Attention   Current Attention Level: Sustained   Following Commands: Follows one step commands with increased time       General Comments: pt continues to be nonverbal today, but is consistently following simple directions with delay.      Exercises      General Comments        Pertinent Vitals/Pain Pain Assessment: Faces Faces Pain Scale: No hurt    Home Living                      Prior Function            PT Goals (current goals can now be found in the care plan section) Acute Rehab PT Goals Patient Stated Goal: to get better - per daughter  PT  Goal Formulation: With patient/family Time For Goal Achievement: 03/04/16 Potential to Achieve Goals: Good Progress towards PT goals: Progressing toward goals    Frequency  Min 3X/week    PT Plan Current plan remains appropriate    Co-evaluation             End of Session Equipment Utilized During Treatment: Gait belt Activity Tolerance: Patient tolerated treatment well Patient left: in chair;with call bell/phone within reach;with chair alarm set;with family/visitor present     Time: [id number] PT Time Calculation (min) (ACUTE ONLY): 26 min  Charges:  $Therapeutic Activity: 23-37 mins                    G CodesCatarina Hartshorn, [place] [phone] 02/21/2016, 11:13 AM

## 2016-02-21 NOTE — Progress Notes (Signed)
I continue to follow pt's progress to assist with planning rehab dispo when appropriate pending insurance approval. [phone]

## 2016-02-21 NOTE — Progress Notes (Signed)
eLink Physician-Brief Progress Note Patient Name: Carolyn [name] DOB: Jan 23, 1933 MRN: [id number]   Date of Service  02/21/2016  HPI/Events of Note  Hypokalemia and hypophosphatemia  eICU Interventions  Potassium and Phos replaced     Intervention Category Intermediate Interventions: Electrolyte abnormality - evaluation and management  DETERDING,ELIZABETH 02/21/2016, 5:52 AM

## 2016-02-21 NOTE — Progress Notes (Signed)
Speech Language Pathology Treatment: Dysphagia;Cognitive-Linquistic  Patient Details Name: Carolyn [name] MRN: [id number] DOB: [birth year] Today's Date: 02/21/2016 Time: [id number] SLP Time Calculation (min) (ACUTE ONLY): 27 min  Assessment / Plan / Recommendation Clinical Impression  Pt provided with POs at bedside to determine readiness for instrumental study. Delayed cough x1 observed during intake of thin liquids. Multiple subswallows and swallows noted with palpation, with oral holding observed with puree. Pt required mod verbal and visual cues to follow directions throughout session. Pt attempted to sing "Happy Birthday" with clinician and daughter, with min-mild attempted labial movement and phonation. Daughter educated re: instrumental study next date as well as compensatory strategies to aid communication, such as speaking in short, concise phrases and allowing extra processing time for pt to respond. SLP will f/u to perform MBS and provide aphasia tx.   HPI HPI: 82 y.o. female with h/o DM, HTN, COPD, asthma and chronic kidney disease, who underwent craniotomy x2 for evacuation of chronic SDH parieto-occipital subdural hematoma. Pt was previously admitted 3/6 after fall and was d/c home next day. Pt was readmitted 3/22 due to headaches and neuro changes, then d/c to CIR 3/29. Pt currently admitted from CIR due to neurological decline. Pt has been receiving SLP intervention while in house, focusing on comprehension, expression, social interaction, problem solving and memory. CT Head 4/4 L frontal lobe 2-3 cm area of cytotoxic edema compatible with small acute/evolving L MCA territory infarct. CXR 4/1 no active cardiopulmonary disease.      SLP Plan  MBS;Continue with current plan of care     Recommendations  Diet recommendations: NPO Medication Administration: Via alternative means             Oral Care Recommendations: Oral care QID Follow up Recommendations: Inpatient Rehab Plan:  MBS;Continue with current plan of care     Jerris Keltz, Student-SLP                 Krissie Merrick [facility] 02/21/2016, 2:35 PM

## 2016-02-21 NOTE — Progress Notes (Signed)
No acute events AVSS Awake and alert Talking some now Oriented x1 Moving right side better Wound looks good Stable and improving F/u EEG results

## 2016-02-21 NOTE — Consult Note (Signed)
   [facility] CM Inpatient Consult   02/21/2016  Carolyn [name] 1933/07/07 [id number] Patient screened for potential [place] Management services. Patient is eligible for [facility] Care Management services under patient's [employer] Medicare plan.  Patient is s/p craniotomy and seeking out inpatient rehab at this time and awaiting insurance approval. Will follow progress and for needs.  For questions or consults, please contact:  Natividad Brood, RN BSN [facility] Liaison  [phone] business mobile phone Toll free office [phone]

## 2016-02-21 NOTE — Progress Notes (Signed)
Occupational Therapy Treatment Patient Details Name: Carolyn [name] MRN: [id number] DOB: Mar 28, 1933 Today's Date: 02/21/2016    History of present illness Pt admitted from CIR due to neurological decline.  He underwent craniotomy x 2 for evacuation of chronic SDH Lt parieto-occipital subdural hemoatoma.   Pt suffered fall 3/6 after fall.  She sustained SDH and was discharged home 3/7.  She was readmitted 02/05/16  due to headaches and neuro changes.  She underwent craniotomy 02/06/16.  She was discharged to [facility] 3./29/17.  PMH includes:  DM, HTN, Glaucoma, COPD, Asthma, Kidney CA.  She sustained Lt radius fracture initial fall 01/20/16.    OT comments  Pt demonstrates improving ability to participate.  Pt demonstrates motor planning and initiation deficits.  She requires max - total A for ADLs and mod A +2 for functional transfers.   Follow Up Recommendations  CIR    Equipment Recommendations  3 in 1 bedside comode    Recommendations for Other Services Rehab consult    Precautions / Restrictions Precautions Precautions: Fall       Mobility Bed Mobility Overal bed mobility: Needs Assistance;+2 for physical assistance Bed Mobility: Supine to Sit   Sidelying to sit: Mod assist       General bed mobility comments: Pt requires assist to initiate movement and move LEs to edge of bed   Transfers Overall transfer level: Needs assistance Equipment used: 2 person hand held assist Transfers: Sit to/from Stand;Stand Pivot Transfers Sit to Stand: Max assist Stand pivot transfers: Mod assist;+2 physical assistance       General transfer comment: Pt required 3 attempts to move sit to stand as she indicated pain in hip with first two attempts.  Faciliation at [place] for extension.   Once she was in full extension, she requires assist to shift weight.  She requires significant amount of time to initiate and motor plan movement     Balance Overall balance assessment: Needs  assistance Sitting-balance support: Feet supported Sitting balance-Leahy Scale: Poor Sitting balance - Comments: requries min guard to min A    Standing balance support: Bilateral upper extremity supported Standing balance-Leahy Scale: Poor Standing balance comment: requires mod - max A                     ADL                           Toilet Transfer: Moderate assistance;+2 for physical assistance;Stand-pivot;BSC                    Vision                     Perception     Praxis      Cognition   Behavior During Therapy: Flat affect Overall Cognitive Status: Difficult to assess Area of Impairment: Following commands        Following Commands: Follows one step commands inconsistently            Extremity/Trunk Assessment               Exercises Other Exercises Other Exercises: Performed 6 reps AAROM Rt UE    Shoulder Instructions       General Comments      Pertinent Vitals/ Pain       Pain Assessment: Faces Faces Pain Scale: Hurts little more Pain Location: Lt hip/LE with attempts to move sit to stand  Pain Descriptors / Indicators: Grimacing Pain Intervention(s): Monitored during session;Repositioned  Home Living                                          Prior Functioning/Environment              Frequency Min 2X/week     Progress Toward Goals  OT Goals(current goals can now be found in the care plan section)  Progress towards OT goals: Progressing toward goals  ADL Goals Pt Will Perform Grooming: with mod assist;sitting Pt Will Perform Upper Body Bathing: with mod assist;sitting Pt Will Perform Lower Body Bathing: with min assist;sit to/from stand Pt Will Transfer to Toilet: with mod assist;stand pivot transfer;bedside commode Pt Will Perform Toileting - Clothing Manipulation and hygiene: with min assist;sit to/from stand Pt/caregiver will Perform Home Exercise Program: Increased  ROM;Left upper extremity;With minimal assist;With written HEP provided Additional ADL Goal #1: Pt will use Lt UE as an active assist 50% of time during ADLs  Additional ADL Goal #2: Pt will maintain sustained attention to simple ADL taks with min cues x 5 mins   Plan Discharge plan remains appropriate    Co-evaluation                 End of Session Equipment Utilized During Treatment: Gait belt   Activity Tolerance Patient tolerated treatment well   Patient Left in chair;with call bell/phone within reach;with chair alarm set;with family/visitor present;with nursing/sitter in room   Nurse Communication Mobility status        Time: [id number] OT Time Calculation (min): 31 min  Charges: OT General Charges $OT Visit: 1 Procedure OT Treatments $Neuromuscular Re-education: 23-37 mins  Tazaria Dlugosz M 02/21/2016, 5:41 PM

## 2016-02-21 NOTE — Consult Note (Signed)
PULMONARY / CRITICAL CARE MEDICINE   Name: Carolyn [name] MRN: [id number] DOB: 20-May-1933    ADMISSION DATE:  02/17/2016 CONSULTATION DATE:  4/5  REFERRING MD:  NS  CHIEF COMPLAINT:  Post crani x 3  HISTORY OF PRESENT ILLNESS:   82 female with chronic left SDH , HTN who required left craniotomy 3/23 for evacuation of Left  SDH and was extubated and did well ans was discharged to [facility] 3/29. While on rehab she developed decrease LOC and required evacuation of recurrent left SDH and readmitted to [facility] hospital. On 4/4 she again had decreased LOC and required craniotomy again with evacuation of left SDH and epidural hematoma. She is extubated and has right hemiplegia but is stable at the time of  this consult 4/5 . PCCM will assist with medical management.  SUBJECTIVE:  Improved alertness  VITAL SIGNS: BP 109/66 mmHg  Pulse 74  Temp(Src) 98.9 F (37.2 C) (Oral)  Resp 32  Wt 94.8 kg (208 lb 15.9 oz)  SpO2 97%  LMP 03/24/2010  HEMODYNAMICS:    VENTILATOR SETTINGS:    INTAKE / OUTPUT: I/O last 3 completed shifts: In: [id number] [I.V.:1700; NG/GT:565; IV Piggyback:1250] Out: 725 [Urine:725]  PHYSICAL EXAMINATION: General:  Awake female in NAD  Neuro: Right hemiplegia, follows commands, more awake HEENT: Left head dressing intact Cardiovascular:HSR RRR Lungs: CTA Abdomen:  Soft + BS Musculoskeletal:  INTACT Skin:  WARM AND DRY  LABS:  BMET  Recent Labs Lab 02/18/16 0424 02/18/16 0604 02/20/16 0258 02/21/16 0400  NA 140 138 141 141  K 3.3* 3.2* 3.0* 3.3*  CL 106  --  110 107  CO2 21*  --  22 26  BUN 7  --  5* 6  CREATININE 0.82  --  0.57 0.47  GLUCOSE 259* 254* 131* 153*    Electrolytes  Recent Labs Lab 02/18/16 0424 02/20/16 0258 02/21/16 0400  CALCIUM 9.8 9.0 8.6*  MG  --  1.7 2.4  PHOS  --  1.3* 1.8*    CBC  Recent Labs Lab 02/15/16 1351  02/18/16 0424 02/18/16 0604 02/20/16 0258  WBC 9.3  --  16.1*  --  11.4*  HGB 12.8  < > 11.8* 10.5* 9.3*   HCT 37.9  < > 35.6* 31.0* 29.3*  PLT 225  --  217  --  180  < > = values in this interval not displayed.  Coag's No results for input(s): APTT, INR in the last 168 hours.  Sepsis Markers No results for input(s): LATICACIDVEN, PROCALCITON, O2SATVEN in the last 168 hours.  ABG No results for input(s): PHART, PCO2ART, PO2ART in the last 168 hours.  Liver Enzymes No results for input(s): AST, ALT, ALKPHOS, BILITOT, ALBUMIN in the last 168 hours.  Cardiac Enzymes No results for input(s): TROPONINI, PROBNP in the last 168 hours.  Glucose  Recent Labs Lab 02/20/16 1138 02/20/16 1811 02/20/16 2008 02/20/16 2357 02/21/16 0409 02/21/16 0823  GLUCAP 114* 147* 112* 140* 150* 173*    Imaging Dg Chest Port 1 View  02/21/2016  CLINICAL DATA:  Atelectasis. EXAM: PORTABLE CHEST 1 VIEW COMPARISON:  February 15, 2016. FINDINGS: Stable cardiomediastinal silhouette. No pneumothorax is noted. Bony thorax is intact. Minimal mild bibasilar subsegmental atelectasis is noted. IMPRESSION: Minimal bibasilar subsegmental atelectasis. Electronically Signed   By: Marijo Conception, M.D.   On: 02/21/2016 07:38   Dg Abd Portable 1v  02/20/2016  CLINICAL DATA:  Feeding tube placement EXAM: PORTABLE ABDOMEN - 1 VIEW COMPARISON:  None. FINDINGS:  There is normal small bowel gas pattern. Postcholecystectomy surgical clips are noted. There is NG feeding tube with tip in distal duodenum. IMPRESSION: NG feeding tube in place with tip in distal duodenum. Electronically Signed   By: Lahoma Crocker M.D.   On: 02/20/2016 15:47    STUDIES:  Echo Normal LV systolic function; grade 1 diastolic dysfunction with  elevated LV filling pressure; mild LAE; trace AI; mild MR; trace  TR with mildly elevated pulmonary pressure  CULTURES:   ANTIBIOTICS:  SIGNIFICANT EVENTS: 3/23 evac of left sdh 4/3 Evac of pf recurrent left SDH 4/4 evacuaton of left recurrent SDH/EDH 4/5- more awake  LINES/TUBES:  DISCUSSION: 82 yo post  crani x 3. Multiple medical issues and PCCM asked to assist with her care.  ASSESSMENT / PLAN:  PULMONARY A: COPD P:   BD's as needed IS re attempt  CARDIOVASCULAR A:  HTN RBBB old Tachy improved- at risk MAT (theo?) P:  Metoprolol, maintain IV prn Currently not needing oral meds for BP control If needed add metoprolol low dose , as wil have reflex tachy from hydral Tele Assess Pa pressures with echo RBBB - mild Pa pressures from copd, re assuring Dc theophylline I would NOT restart this agent in the hospital, get level pending  RENAL  A:   Hypokalemia Mild hyperchloremia hypophos P:   K, phos Labs in am  kvo  GASTROINTESTINAL A:   GI protection R./o dysphagia P:   PPI slp on going, improving Hope can remove cortrak  HEMATOLOGIC A:   No acute issues P:  dvt prevention, - scd  INFECTIOUS A:   No acute infection P:   Follow temp curve  ENDOCRINE A:   DM -controlled P:   SSI  NEUROLOGIC A:   Post Left crani x 3 for SDH/EDH last on 4/4 Szs P:   RASS goal: 0 Needs aggressive PT Keppra Pending result eeg, but she ism making clinical progress   FAMILY  - Updates: family updated  - Inter-disciplinary family meet or Palliative Care meeting due by:  day 7   Will sign off cal if needed See comments in [facility] section  Lavon Paganini. Titus Mould, MD, [place] Pgr: [facility]

## 2016-02-21 NOTE — Procedures (Signed)
History: Carolyn [name] is an 82 y.o. female patient with altered mental status. Routine inpatient EEG was performed for further evaluation.   Patient Active Problem List   Diagnosis Date Noted  . Atelectasis   . Spastic hemiplegia affecting right side 02/19/2016  . S/P craniotomy 02/17/2016  . Traumatic subdural hematoma ([place]) 02/12/2016  . Headache as late effect of brain injury ([place])   . Seizure ([place])   . Difficulty controlling behavior as late effect of traumatic brain injury ([place])   . 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 Memory dysfunction as late effect of traumatic brain injury ([place])   . Benign essential HTN   . DM type 2 with diabetic peripheral neuropathy ([place])   . Thyroid activity decreased   . TBI (traumatic brain injury) ([place])   . Cognitive deficit as late effect of traumatic brain injury ([place])   . Falls frequently   . Controlled type 2 diabetes mellitus with complication, without long-term current use of insulin ([place])   . Tachycardia   . Chronic obstructive pulmonary disease ([place])   . Subdural hematoma ([place]) 02/05/2016  . Chronic bronchitis ([place])   . SDH (subdural hematoma) ([place]) 01/20/2016  . Fall 01/20/2016  . Closed fracture of left elbow 01/20/2016  . Diabetes mellitus without complication ([place])   . Hypercholesterolemia   . COPD (chronic obstructive pulmonary disease) ([place])   . Hypothyroid   . Essential hypertension   . Inguinal hernia unilateral, non-recurrent, left 12/02/2011     Current facility-administered medications:  .  0.9 %  sodium chloride infusion, , Intravenous, Continuous, Raylene Miyamoto, MD, Last Rate: 10 mL/hr at 02/20/16 1800 .  albuterol (PROVENTIL) (2.5 MG/3ML) 0.083% nebulizer solution 2.5 mg, 2.5 mg, Nebulization, Q4H PRN, Kevan Ny Ditty, MD .  antiseptic oral rinse (CPC / CETYLPYRIDINIUM CHLORIDE 0.05%) solution 7 mL, 7 mL, Mouth Rinse, BID, Kevan Ny  Ditty, MD, 7 mL at 02/21/16 0937 .  bisacodyl (DULCOLAX) EC tablet 5 mg, 5 mg, Oral, Daily PRN, Kevan Ny Ditty, MD .  docusate sodium (COLACE) capsule 100 mg, 100 mg, Oral, BID, Kevan Ny Ditty, MD, 100 mg at 02/21/16 0923 .  feeding supplement (JEVITY 1.2 CAL) liquid 1,000 mL, 1,000 mL, Per Tube, Continuous, Raylene Miyamoto, MD, Last Rate: 55 mL/hr at 02/21/16 0500, 1,000 mL at 02/21/16 0500 .  glimepiride (AMARYL) tablet 4 mg, 4 mg, Oral, QHS, Kevan Ny Ditty, MD, 4 mg at 02/20/16 2128 .  hydrALAZINE (APRESOLINE) injection 10-40 mg, 10-40 mg, Intravenous, Q1H PRN, Kara Mead V, MD, 20 mg at 02/19/16 1111 .  HYDROmorphone (DILAUDID) injection 0.5-1 mg, 0.5-1 mg, Intravenous, Q2H PRN, Kevan Ny Ditty, MD, 0.5 mg at 02/18/16 2010 .  insulin aspart (novoLOG) injection 0-15 Units, 0-15 Units, Subcutaneous, 4 times per day, Earnie Larsson, MD, 3 Units at 02/21/16 [phone] .  latanoprost (XALATAN) 0.005 % ophthalmic solution 1 drop, 1 drop, Both Eyes, QHS, Kevan Ny Ditty, MD, 1 drop at 02/20/16 2128 .  levETIRAcetam (KEPPRA) 1,000 mg in sodium chloride 0.9 % 100 mL IVPB, 1,000 mg, Intravenous, Q12H, Earnie Larsson, MD, 1,000 mg at 02/21/16 0924 .  lisinopril (PRINIVIL,ZESTRIL) tablet 20 mg, 20 mg, Oral, QHS, Kevan Ny Ditty, MD, 20 mg at 02/20/16 2128 .  metoprolol (LOPRESSOR) injection 5 mg, 5 mg, Intravenous, 4 times per day, Rush Farmer, MD, 5 mg at 02/21/16 1224 .  mometasone-formoterol (DULERA) 200-5 MCG/ACT inhaler 2 puff, 2 puff,  Inhalation, BID, Kevan Ny Ditty, MD, 2 puff at 02/21/16 1039 .  naloxone [facility]) injection 0.08 mg, 0.08 mg, Intravenous, PRN, Kevan Ny Ditty, MD .  ondansetron [facility]) tablet 4 mg, 4 mg, Oral, Q4H PRN **OR** ondansetron (ZOFRAN) injection 4 mg, 4 mg, Intravenous, Q4H PRN, Kevan Ny Ditty, MD, 4 mg at 02/18/16 0311 .  pantoprazole (PROTONIX) injection 40 mg, 40 mg, Intravenous, QHS, Kevan Ny Ditty, MD, 40 mg at 02/20/16  2127 .  polyethylene glycol (MIRALAX / GLYCOLAX) packet 17 g, 17 g, Oral, Daily PRN, Kevan Ny Ditty, MD .  promethazine (PHENERGAN) tablet 12.5-25 mg, 12.5-25 mg, Oral, Q4H PRN, Kevan Ny Ditty, MD .  senna [facility]) tablet 8.6 mg, 1 tablet, Oral, BID, Kevan Ny Ditty, MD, 8.6 mg at 02/21/16 0923 .  thyroid (ARMOUR) tablet 60 mg, 60 mg, Oral, QHS, Kevan Ny Ditty, MD, 60 mg at 02/20/16 2127 .  verapamil (CALAN-SR) CR tablet 180 mg, 180 mg, Oral, QHS, Kevan Ny Ditty, MD, 180 mg at 02/20/16 2128   Introduction:  This is a 19 channel routine scalp EEG performed at the bedside with bipolar and monopolar montages arranged in accordance to the international 10/20 system of electrode placement. One channel was dedicated to EKG recording.   Findings:  Mild generalized background slowing in the range of 7  Hz noted . Intermittent left central abnormal epileptiform discharges were noted, without evidence of electrographic seizures during this recording.   Impression:  Abnormal awake and drowsy routine inpatient EEG suggestive of possible epileptogenic potential in the left fronto-parietal region, with superimposed mild encephalopathy. Clinical correlation is recommended .

## 2016-02-21 NOTE — Progress Notes (Signed)
  Echocardiogram 2D Echocardiogram has been performed.  Carolyn [name] 02/21/2016, 8:43 AM

## 2016-02-22 ENCOUNTER — Inpatient Hospital Stay (HOSPITAL_COMMUNITY): Payer: PPO

## 2016-02-22 DIAGNOSIS — R4182 Altered mental status, unspecified: Secondary | ICD-10-CM | POA: Diagnosis not present

## 2016-02-22 DIAGNOSIS — I6200 Nontraumatic subdural hemorrhage, unspecified: Secondary | ICD-10-CM | POA: Diagnosis not present

## 2016-02-22 DIAGNOSIS — R569 Unspecified convulsions: Secondary | ICD-10-CM

## 2016-02-22 DIAGNOSIS — Z9889 Other specified postprocedural states: Secondary | ICD-10-CM | POA: Diagnosis not present

## 2016-02-22 LAB — BASIC METABOLIC PANEL
Anion gap: 10 (ref 5–15)
BUN: <5 mg/dL — ABNORMAL LOW (ref 6–20)
CO2: 27 mmol/L (ref 22–32)
Calcium: 8.9 mg/dL (ref 8.9–10.3)
Chloride: 103 mmol/L (ref 101–111)
Creatinine, Ser: 0.53 mg/dL (ref 0.44–1.00)
GFR calc Af Amer: >60 mL/min (ref >60)
GFR calc non Af Amer: >60 mL/min (ref >60)
Glucose, Bld: 171 mg/dL — ABNORMAL HIGH (ref 65–99)
Potassium: 3.8 mmol/L (ref 3.5–5.1)
Sodium: 140 mmol/L (ref 135–145)

## 2016-02-22 LAB — GLUCOSE, CAPILLARY
Glucose-Capillary: 185 mg/dL — ABNORMAL HIGH (ref 65–99)
Glucose-Capillary: 116 mg/dL — ABNORMAL HIGH (ref 65–99)
Glucose-Capillary: 169 mg/dL — ABNORMAL HIGH (ref 65–99)
Glucose-Capillary: 170 mg/dL — ABNORMAL HIGH (ref 65–99)
Glucose-Capillary: 157 mg/dL — ABNORMAL HIGH (ref 65–99)

## 2016-02-22 LAB — PHOSPHORUS: Phosphorus: 2.3 mg/dL — ABNORMAL LOW (ref 2.5–4.6)

## 2016-02-22 MED ORDER — INSULIN ASPART 100 UNIT/ML ~~LOC~~ SOLN
0.0000 [IU] | SUBCUTANEOUS | Status: DC
  Administered 2016-02-22 (×2): 3 [IU] via SUBCUTANEOUS
  Administered 2016-02-23: 2 [IU] via SUBCUTANEOUS
  Administered 2016-02-23 (×3): 3 [IU] via SUBCUTANEOUS
  Administered 2016-02-24: 2 [IU] via SUBCUTANEOUS
  Administered 2016-02-24: 3 [IU] via SUBCUTANEOUS
  Administered 2016-02-24 (×2): 2 [IU] via SUBCUTANEOUS
  Administered 2016-02-24 – 2016-02-25 (×3): 3 [IU] via SUBCUTANEOUS
  Administered 2016-02-25 (×2): 2 [IU] via SUBCUTANEOUS
  Administered 2016-02-26: 3 [IU] via SUBCUTANEOUS
  Administered 2016-02-26: 2 [IU] via SUBCUTANEOUS
  Administered 2016-02-26: 3 [IU] via SUBCUTANEOUS
  Administered 2016-02-26: 2 [IU] via SUBCUTANEOUS

## 2016-02-22 MED ORDER — DOCUSATE SODIUM 50 MG/5ML PO LIQD
100.0000 mg | Freq: Two times a day (BID) | ORAL | Status: DC
  Administered 2016-02-22 – 2016-02-25 (×5): 100 mg via ORAL
  Filled 2016-02-22 (×8): qty 10

## 2016-02-22 MED ORDER — VERAPAMIL HCL 40 MG PO TABS
40.0000 mg | ORAL_TABLET | Freq: Three times a day (TID) | ORAL | Status: DC
  Administered 2016-02-22 – 2016-02-26 (×12): 40 mg via ORAL
  Filled 2016-02-22 (×13): qty 1

## 2016-02-22 NOTE — Progress Notes (Signed)
No acute overnight events AVSS Awake and alert Expressive aphasia Follows commands, improving R hemiparesis Stable Continue Keppra Transfer to stepdown Continue therapy

## 2016-02-22 NOTE — Progress Notes (Signed)
Interval History:                                                                                                                      Carolyn [name] is an 82 y.o. female patient with  SDH, seizures. Doing better day by day, improving rigt sided strength, follows commands better, more alert. No sz.  Daughter at bedside, happy with progress.   No new neuro sx.   Past Medical History: Past Medical History  Diagnosis Date  . Diabetes mellitus   . Hypertension   . Hypercholesterolemia   . Gallstones   . Glaucoma   . Cataracts, bilateral   . COPD (chronic obstructive pulmonary disease) ([place])   . Hypothyroid   . PONV (postoperative nausea and vomiting)   . Asthma   . Chronic kidney disease 2007    kidney cancer and partial nephrctomy-right  . Subdural bleeding [facility])     Past Surgical History  Procedure Laterality Date  . Dilation and curettage of uterus    . Partial nephrectomy      cancer  . Breast biopsy    . D&c for enlarged uterine  210  . Total abdominal hysterectomy w/ bilateral salpingoophorectomy  2011  . Cateract extractions and iol    . Cholecystectomy  2008  . Abdominal hysterectomy    . Eye surgery  08/2011,09/2012    bilateral cataracts  . Hernia repair  01/19/12    inguinal  . Inguinal hernia repair  01/19/2012    Procedure: HERNIA REPAIR INGUINAL ADULT;  Surgeon: Earnstine Regal, MD;  Location: [facility] ORS;  Service: General;  Laterality: Left;  repair left inguinal hernia with mesh   . Craniotomy Left 02/06/2016    Procedure: CRANIOTOMY HEMATOMA EVACUATION SUBDURAL;  Surgeon: Kevan Ny Ditty, MD;  Location: [facility] NEURO ORS;  Service: Neurosurgery;  Laterality: Left;  . Craniotomy N/A 02/17/2016    Procedure: CRANIOTOMY HEMATOMA EVACUATION SUBDURAL;  Surgeon: Kevan Ny Ditty, MD;  Location: [place] NEURO ORS;  Service: Neurosurgery;  Laterality: N/A;  left  . Craniotomy Left 02/18/2016    Procedure: CRANIOTOMY HEMATOMA EVACUATION SUBDURAL;  Surgeon: Earnie Larsson, MD;  Location:  [place] NEURO ORS;  Service: Neurosurgery;  Laterality: Left;    Family History: Family History  Problem Relation Age of Onset  . Cancer      Social History:   reports that she quit smoking about 34 years ago. Her smoking use included Cigarettes. She has a 5 pack-year smoking history. She has never used smokeless tobacco. She reports that she does not drink alcohol or use illicit drugs.  Allergies:  Allergies  Allergen Reactions  . Darvocet [Propoxyphene N-Acetaminophen] Nausea And Vomiting  . Metformin And Related Diarrhea  . Percocet [Oxycodone-Acetaminophen] Nausea And Vomiting  . Vicodin [Hydrocodone-Acetaminophen] Itching     Medications:  Current facility-administered medications:  .  0.9 %  sodium chloride infusion, , Intravenous, Continuous, Raylene Miyamoto, MD, Last Rate: 10 mL/hr at 02/22/16 1300 .  albuterol (PROVENTIL) (2.5 MG/3ML) 0.083% nebulizer solution 2.5 mg, 2.5 mg, Nebulization, Q4H PRN, Kevan Ny Ditty, MD .  antiseptic oral rinse (CPC / CETYLPYRIDINIUM CHLORIDE 0.05%) solution 7 mL, 7 mL, Mouth Rinse, BID, Kevan Ny Ditty, MD, 7 mL at 02/22/16 1000 .  bisacodyl (DULCOLAX) EC tablet 5 mg, 5 mg, Oral, Daily PRN, Kevan Ny Ditty, MD .  docusate sodium (COLACE) capsule 100 mg, 100 mg, Oral, BID, Kevan Ny Ditty, MD, 100 mg at 02/21/16 2213 .  feeding supplement (JEVITY 1.2 CAL) liquid 1,000 mL, 1,000 mL, Per Tube, Continuous, Raylene Miyamoto, MD, Last Rate: 55 mL/hr at 02/22/16 1321, 1,000 mL at 02/22/16 1321 .  glimepiride (AMARYL) tablet 4 mg, 4 mg, Oral, QHS, Kevan Ny Ditty, MD, 4 mg at 02/21/16 2213 .  hydrALAZINE (APRESOLINE) injection 10-40 mg, 10-40 mg, Intravenous, Q1H PRN, Kara Mead V, MD, 20 mg at 02/22/16 1228 .  HYDROmorphone (DILAUDID) injection 0.5-1 mg, 0.5-1 mg, Intravenous, Q2H PRN, Kevan Ny Ditty,  MD, 0.5 mg at 02/18/16 2010 .  insulin aspart (novoLOG) injection 0-15 Units, 0-15 Units, Subcutaneous, 6 times per day, Tamala Fothergill, MD, 3 Units at 02/22/16 1727 .  latanoprost (XALATAN) 0.005 % ophthalmic solution 1 drop, 1 drop, Both Eyes, QHS, Kevan Ny Ditty, MD, 1 drop at 02/21/16 2214 .  levETIRAcetam (KEPPRA) 1,250 mg in sodium chloride 0.9 % 100 mL IVPB, 1,250 mg, Intravenous, Q12H, Zaley Talley Fuller Mandril, MD, 1,250 mg at 02/22/16 0936 .  lisinopril (PRINIVIL,ZESTRIL) tablet 20 mg, 20 mg, Oral, QHS, Kevan Ny Ditty, MD, 20 mg at 02/21/16 2213 .  metoprolol (LOPRESSOR) injection 5 mg, 5 mg, Intravenous, 4 times per day, Rush Farmer, MD, 5 mg at 02/22/16 1727 .  mometasone-formoterol (DULERA) 200-5 MCG/ACT inhaler 2 puff, 2 puff, Inhalation, BID, Kevan Ny Ditty, MD, 2 puff at 02/22/16 0908 .  naloxone [facility]) injection 0.08 mg, 0.08 mg, Intravenous, PRN, Kevan Ny Ditty, MD .  ondansetron [facility]) tablet 4 mg, 4 mg, Oral, Q4H PRN **OR** ondansetron (ZOFRAN) injection 4 mg, 4 mg, Intravenous, Q4H PRN, Kevan Ny Ditty, MD, 4 mg at 02/18/16 0311 .  pantoprazole (PROTONIX) injection 40 mg, 40 mg, Intravenous, QHS, Kevan Ny Ditty, MD, 40 mg at 02/21/16 2213 .  polyethylene glycol (MIRALAX / GLYCOLAX) packet 17 g, 17 g, Oral, Daily PRN, Kevan Ny Ditty, MD .  promethazine (PHENERGAN) tablet 12.5-25 mg, 12.5-25 mg, Oral, Q4H PRN, Kevan Ny Ditty, MD .  senna [facility]) tablet 8.6 mg, 1 tablet, Oral, BID, Kevan Ny Ditty, MD, 8.6 mg at 02/22/16 0936 .  thyroid (ARMOUR) tablet 60 mg, 60 mg, Oral, QHS, Kevan Ny Ditty, MD, 60 mg at 02/21/16 2213 .  verapamil (CALAN-SR) CR tablet 180 mg, 180 mg, Oral, QHS, Kevan Ny Ditty, MD, 180 mg at 02/21/16 2213   Neurologic Examination:  Today's Vitals   02/22/16 1345 02/22/16 1350 02/22/16  1640 02/22/16 1643  BP:  103/59  149/84  Pulse:  76  86  Temp: 98.2 F (36.8 C)  97.9 F (36.6 C)   TempSrc: Oral  Oral   Resp:  23  20  Weight: 91.4 kg (201 lb 8 oz)     SpO2:  96%  91%  PainSc:        Evaluation of higher integrative functions including: Level of alertness: Alert,  Appears Oriented to  place and person Speech: motor aphasia noted, follows commands  Test the following cranial nerves: 2-12 grossly appear intact Motor examination: no drift, mild weakness on right UE/LE compared to left   No abnormal involuntary movements or tremors noted.    Lab Results: Basic Metabolic Panel:  Recent Labs Lab 02/18/16 0424 02/18/16 0604 02/20/16 0258 02/21/16 0400 02/22/16 0244  NA 140 138 141 141 140  K 3.3* 3.2* 3.0* 3.3* 3.8  CL 106  --  110 107 103  CO2 21*  --  22 26 27   GLUCOSE 259* 254* 131* 153* 171*  BUN 7  --  5* 6 <5*  CREATININE 0.82  --  0.57 0.47 0.53  CALCIUM 9.8  --  9.0 8.6* 8.9  MG  --   --  1.7 2.4  --   PHOS  --   --  1.3* 1.8* 2.3*    Liver Function Tests: No results for input(s): AST, ALT, ALKPHOS, BILITOT, PROT, ALBUMIN in the last 168 hours. No results for input(s): LIPASE, AMYLASE in the last 168 hours. No results for input(s): AMMONIA in the last 168 hours.  CBC:  Recent Labs Lab 02/17/16 1347 02/18/16 0424 02/18/16 0604 02/20/16 0258  WBC  --  16.1*  --  11.4*  HGB 12.2 11.8* 10.5* 9.3*  HCT 36.0 35.6* 31.0* 29.3*  MCV  --  85.6  --  87.2  PLT  --  217  --  180    Cardiac Enzymes: No results for input(s): CKTOTAL, CKMB, CKMBINDEX, TROPONINI in the last 168 hours.  Lipid Panel: No results for input(s): CHOL, TRIG, HDL, CHOLHDL, VLDL, LDLCALC in the last 168 hours.  CBG:  Recent Labs Lab 02/21/16 2035 02/21/16 2324 02/22/16 0804 02/22/16 1310 02/22/16 1657  GLUCAP 119* 185* 116* 169* 170*    Microbiology: Results for orders placed or performed during the hospital encounter of 02/12/16  Urine culture      Status: None   Collection Time: 02/13/16  7:03 PM  Result Value Ref Range Status   Specimen Description URINE, CLEAN CATCH  Final   Special Requests NONE  Final   Culture MULTIPLE SPECIES PRESENT, SUGGEST RECOLLECTION  Final   Report Status 02/14/2016 FINAL  Final  Urine culture     Status: None   Collection Time: 02/15/16  6:25 PM  Result Value Ref Range Status   Specimen Description URINE, CATHETERIZED  Final   Special Requests NONE  Final   Culture NO GROWTH 2 DAYS  Final   Report Status 02/17/2016 FINAL  Final    Imaging: Dg Chest Port 1 View  02/21/2016  CLINICAL DATA:  Atelectasis. EXAM: PORTABLE CHEST 1 VIEW COMPARISON:  February 15, 2016. FINDINGS: Stable cardiomediastinal silhouette. No pneumothorax is noted. Bony thorax is intact. Minimal mild bibasilar subsegmental atelectasis is noted. IMPRESSION: Minimal bibasilar subsegmental atelectasis. Electronically Signed   By: Marijo Conception, M.D.   On: 02/21/2016 07:38   Dg Abd Portable 1v  02/20/2016  CLINICAL DATA:  Feeding tube placement EXAM: PORTABLE ABDOMEN - 1 VIEW COMPARISON:  None. FINDINGS: There is normal small bowel gas pattern. Postcholecystectomy surgical clips are noted. There is NG feeding tube with tip in distal duodenum. IMPRESSION: NG feeding tube in place with tip in distal duodenum. Electronically Signed   By: Lahoma Crocker M.D.   On: 02/20/2016 15:47    Assessment and plan:    Carolyn [name] is an 82 y.o. female patient with  SDH, seizures. Doing better day by day, improving rigt sided strength, follows commands better, more alert. No sz.  Daughter at bedside, happy with progress.  Continue keppra 1250 mg BID.  No new neuro sx. No further recs.

## 2016-02-22 NOTE — Progress Notes (Signed)
SLP Cancellation Note  Patient Details Name: Carolyn [name] MRN: [id number] DOB: [birth year]   Cancelled treatment:       Reason Eval/Treat Not Completed: MBS planned for today, but per discussion with radiology, they are unable to perform MBS studies today unless emergency.  Given that [name] has nutritional support, will defer until next date.     Juan Quam Laurice 02/22/2016, 9:13 AM

## 2016-02-22 NOTE — Consult Note (Signed)
Requesting Physician: Dr.  Cyndy Freeze    Reason for consultation:  To manage seizures  HPI:                                                                                                                                         Carolyn [name] is an 82 y.o. female patient with left cerebral convexity subdural hematoma, status post evacuation. She was noted to have poor responsiveness and altered mental status. EEG showed diffuse abnormal left frontoparietal discharges, no atrophic seizures. She is currently on Keppra 1 g twice a day. Neurology is consulted mainly for evaluation and management of seizure medications. Patient is aphasic, unable to provide any history. Patient's daughter is a OR Marine scientist, provided history.  Daughter reports that patient has been more awake today, able to tell her name to Dr. Cyndy Freeze earlier and may follow some simple one-step commands occasionally.   She underwent craniotomy x 2 for evacuation of chronic SDH Lt parieto-occipital subdural hemoatoma. Pt suffered fall 3/6 after fall. She sustained SDH and was discharged home 3/7. She was readmitted 02/05/16 due to headaches and neuro changes. She underwent craniotomy 02/06/16. She was discharged to [facility] 3./29/17. PMH includes: DM, HTN, Glaucoma, COPD, Asthma, Kidney CA. She sustained Lt radius fracture initial fall 01/20/16.   Past Medical History: Past Medical History  Diagnosis Date  . Diabetes mellitus   . Hypertension   . Hypercholesterolemia   . Gallstones   . Glaucoma   . Cataracts, bilateral   . COPD (chronic obstructive pulmonary disease) ([place])   . Hypothyroid   . PONV (postoperative nausea and vomiting)   . Asthma   . Chronic kidney disease 2007    kidney cancer and partial nephrctomy-right  . Subdural bleeding [facility])     Past Surgical History  Procedure Laterality Date  . Dilation and curettage of uterus    . Partial nephrectomy      cancer  . Breast biopsy    . D&c for enlarged uterine  210  .  Total abdominal hysterectomy w/ bilateral salpingoophorectomy  2011  . Cateract extractions and iol    . Cholecystectomy  2008  . Abdominal hysterectomy    . Eye surgery  08/2011,09/2012    bilateral cataracts  . Hernia repair  01/19/12    inguinal  . Inguinal hernia repair  01/19/2012    Procedure: HERNIA REPAIR INGUINAL ADULT;  Surgeon: Earnstine Regal, MD;  Location: [facility] ORS;  Service: General;  Laterality: Left;  repair left inguinal hernia with mesh   . Craniotomy Left 02/06/2016    Procedure: CRANIOTOMY HEMATOMA EVACUATION SUBDURAL;  Surgeon: Kevan Ny Ditty, MD;  Location: [facility] NEURO ORS;  Service: Neurosurgery;  Laterality: Left;  . Craniotomy N/A 02/17/2016    Procedure: CRANIOTOMY HEMATOMA EVACUATION SUBDURAL;  Surgeon: Kevan Ny Ditty, MD;  Location: [place] NEURO ORS;  Service: Neurosurgery;  Laterality: N/A;  left  .  Craniotomy Left 02/18/2016    Procedure: CRANIOTOMY HEMATOMA EVACUATION SUBDURAL;  Surgeon: Earnie Larsson, MD;  Location: [place] NEURO ORS;  Service: Neurosurgery;  Laterality: Left;    Family History: Family History  Problem Relation Age of Onset  . Cancer      Social History:   reports that she quit smoking about 34 years ago. Her smoking use included Cigarettes. She has a 5 pack-year smoking history. She has never used smokeless tobacco. She reports that she does not drink alcohol or use illicit drugs.  Allergies:  Allergies  Allergen Reactions  . Darvocet [Propoxyphene N-Acetaminophen] Nausea And Vomiting  . Metformin And Related Diarrhea  . Percocet [Oxycodone-Acetaminophen] Nausea And Vomiting  . Vicodin [Hydrocodone-Acetaminophen] Itching     Medications:                                                                                                                         Current facility-administered medications:  .  0.9 %  sodium chloride infusion, , Intravenous, Continuous, Raylene Miyamoto, MD, Last Rate: 10 mL/hr at 02/20/16 1800 .  albuterol  (PROVENTIL) (2.5 MG/3ML) 0.083% nebulizer solution 2.5 mg, 2.5 mg, Nebulization, Q4H PRN, Kevan Ny Ditty, MD .  antiseptic oral rinse (CPC / CETYLPYRIDINIUM CHLORIDE 0.05%) solution 7 mL, 7 mL, Mouth Rinse, BID, Kevan Ny Ditty, MD, 7 mL at 02/21/16 2214 .  bisacodyl (DULCOLAX) EC tablet 5 mg, 5 mg, Oral, Daily PRN, Kevan Ny Ditty, MD .  docusate sodium (COLACE) capsule 100 mg, 100 mg, Oral, BID, Kevan Ny Ditty, MD, 100 mg at 02/21/16 2213 .  feeding supplement (JEVITY 1.2 CAL) liquid 1,000 mL, 1,000 mL, Per Tube, Continuous, Raylene Miyamoto, MD, Last Rate: 55 mL/hr at 02/21/16 1757, 1,000 mL at 02/21/16 1757 .  glimepiride (AMARYL) tablet 4 mg, 4 mg, Oral, QHS, Kevan Ny Ditty, MD, 4 mg at 02/21/16 2213 .  hydrALAZINE (APRESOLINE) injection 10-40 mg, 10-40 mg, Intravenous, Q1H PRN, Kara Mead V, MD, 20 mg at 02/19/16 1111 .  HYDROmorphone (DILAUDID) injection 0.5-1 mg, 0.5-1 mg, Intravenous, Q2H PRN, Kevan Ny Ditty, MD, 0.5 mg at 02/18/16 2010 .  insulin aspart (novoLOG) injection 0-15 Units, 0-15 Units, Subcutaneous, 4 times per day, Earnie Larsson, MD, 2 Units at 02/22/16 0456 .  latanoprost (XALATAN) 0.005 % ophthalmic solution 1 drop, 1 drop, Both Eyes, QHS, Kevan Ny Ditty, MD, 1 drop at 02/21/16 2214 .  levETIRAcetam (KEPPRA) 1,250 mg in sodium chloride 0.9 % 100 mL IVPB, 1,250 mg, Intravenous, Q12H, Jinnifer Montejano Fuller Mandril, MD, 1,250 mg at 02/21/16 2214 .  lisinopril (PRINIVIL,ZESTRIL) tablet 20 mg, 20 mg, Oral, QHS, Kevan Ny Ditty, MD, 20 mg at 02/21/16 2213 .  metoprolol (LOPRESSOR) injection 5 mg, 5 mg, Intravenous, 4 times per day, Rush Farmer, MD, 5 mg at 02/22/16 0456 .  mometasone-formoterol (DULERA) 200-5 MCG/ACT inhaler 2 puff, 2 puff, Inhalation, BID, Kevan Ny Ditty, MD, 2 puff at 02/21/16 1921 .  naloxone [facility]) injection 0.08  mg, 0.08 mg, Intravenous, PRN, Kevan Ny Ditty, MD .  ondansetron [facility]) tablet 4 mg, 4 mg,  Oral, Q4H PRN **OR** ondansetron (ZOFRAN) injection 4 mg, 4 mg, Intravenous, Q4H PRN, Kevan Ny Ditty, MD, 4 mg at 02/18/16 0311 .  pantoprazole (PROTONIX) injection 40 mg, 40 mg, Intravenous, QHS, Kevan Ny Ditty, MD, 40 mg at 02/21/16 2213 .  polyethylene glycol (MIRALAX / GLYCOLAX) packet 17 g, 17 g, Oral, Daily PRN, Kevan Ny Ditty, MD .  promethazine (PHENERGAN) tablet 12.5-25 mg, 12.5-25 mg, Oral, Q4H PRN, Kevan Ny Ditty, MD .  [facility]) tablet 8.6 mg, 1 tablet, Oral, BID, Kevan Ny Ditty, MD, 8.6 mg at 02/21/16 2213 .  thyroid (ARMOUR) tablet 60 mg, 60 mg, Oral, QHS, Kevan Ny Ditty, MD, 60 mg at 02/21/16 2213 .  verapamil (CALAN-SR) CR tablet 180 mg, 180 mg, Oral, QHS, Kevan Ny Ditty, MD, 180 mg at 02/21/16 2213   ROS:                                                                                                                                       History    unobtainable from patient due to language barrier and Aphasia    Neurologic Examination:                                                                                                    Today's Vitals   02/22/16 0400 02/22/16 0456 02/22/16 0500 02/22/16 0600  BP: 138/75  120/65 127/67  Pulse: 58  57 59  Temp: 97.9 F (36.6 C)     TempSrc: Axillary     Resp: 22  25 23   Weight:  94.4 kg (208 lb 1.8 oz)    SpO2: 97%  92% 99%  PainSc:        Evaluation of higher integrative functions including: Level of alertness: Alert,  Unable to assess orientation to time, place, she does respond better to her daughter's verbal commands Speech: Severe dysarthria,  sensorimotor aphasia.,   Test the following cranial nerves: 2-12 grossly appear intact, UMN right facial weakness Motor examination:  right hemiparesis with right facial weakness, sustain antigravity strength in the left upper and lower extremities, No definite neglect noted , unable to assess sensory exam due to poor  cooperation No abnormal involuntary movements or tremors noted.     Lab Results: Basic Metabolic Panel:  Recent Labs Lab 02/15/16 1406  02/18/16 0424 02/18/16 0604 02/20/16 0258 02/21/16 0400 02/22/16 0244  NA  136  < > 140 138 141 141 140  K 3.8  < > 3.3* 3.2* 3.0* 3.3* 3.8  CL 105  --  106  --  110 107 103  CO2 22  --  21*  --  22 26 27   GLUCOSE 132*  < > 259* 254* 131* 153* 171*  BUN 7  --  7  --  5* 6 <5*  CREATININE 0.63  --  0.82  --  0.57 0.47 0.53  CALCIUM 9.8  --  9.8  --  9.0 8.6* 8.9  MG  --   --   --   --  1.7 2.4  --   PHOS  --   --   --   --  1.3* 1.8* 2.3*  < > = values in this interval not displayed.  Liver Function Tests: No results for input(s): AST, ALT, ALKPHOS, BILITOT, PROT, ALBUMIN in the last 168 hours. No results for input(s): LIPASE, AMYLASE in the last 168 hours. No results for input(s): AMMONIA in the last 168 hours.  CBC:  Recent Labs Lab 02/15/16 1351 02/17/16 1347 02/18/16 0424 02/18/16 0604 02/20/16 0258  WBC 9.3  --  16.1*  --  11.4*  NEUTROABS 4.7  --   --   --   --   HGB 12.8 12.2 11.8* 10.5* 9.3*  HCT 37.9 36.0 35.6* 31.0* 29.3*  MCV 86.9  --  85.6  --  87.2  PLT 225  --  217  --  180    Cardiac Enzymes: No results for input(s): CKTOTAL, CKMB, CKMBINDEX, TROPONINI in the last 168 hours.  Lipid Panel: No results for input(s): CHOL, TRIG, HDL, CHOLHDL, VLDL, LDLCALC in the last 168 hours.  CBG:  Recent Labs Lab 02/21/16 0823 02/21/16 1228 02/21/16 1534 02/21/16 2035 02/21/16 2324  GLUCAP 173* 153* 171* 119* 185*    Microbiology: Results for orders placed or performed during the hospital encounter of 02/12/16  Urine culture     Status: None   Collection Time: 02/13/16  7:03 PM  Result Value Ref Range Status   Specimen Description URINE, CLEAN CATCH  Final   Special Requests NONE  Final   Culture MULTIPLE SPECIES PRESENT, SUGGEST RECOLLECTION  Final   Report Status 02/14/2016 FINAL  Final  Urine culture      Status: None   Collection Time: 02/15/16  6:25 PM  Result Value Ref Range Status   Specimen Description URINE, CATHETERIZED  Final   Special Requests NONE  Final   Culture NO GROWTH 2 DAYS  Final   Report Status 02/17/2016 FINAL  Final     Imaging: Ct Head Wo Contrast  02/18/2016  CLINICAL DATA:  82 year old female status post evacuation of left subdural hematoma. Initial encounter. EXAM: CT HEAD WITHOUT CONTRAST TECHNIQUE: Contiguous axial images were obtained from the base of the skull through the vertex without intravenous contrast. COMPARISON:  0102 hours today and earlier. FINDINGS: Left superior scalp skin staples now in place. Underlying scalp hematoma and scattered subcutaneous gas has mildly progressed. Hematoma and gas tracks inferiorly to the lateral left face. Orbits soft tissues appear stable and negative. Stable paranasal sinuses with opacified frontal and ethmoid sinuses, as well as partial opacification of the left maxillary sinus, with hyperdense material. Tympanic cavities and mastoids remain clear. Sequelae of left side craniotomy. Stable visualized osseous structures. Underlying mixed density left subdural hematoma appears to regressed along with rightward midline shift which has decreased to 5 mm (previously 9 mm).  There is an increased component of hyperdense blood along the interhemispheric fissure and right tentorium, but the left peripheral subdural collection now measures no greater than 6-8 mm in thickness at most levels (previously up to 14 mm). Volume of pneumocephalus has not significantly changed. There is a 2-3 cm area of cytotoxic edema now in the left frontal lobe (series 2, image 22) without significant regional mass effect. No definite intra-axial hemorrhage. Stable gray-white matter differentiation elsewhere. Persistent mass effect on the left lateral ventricle with no ventriculomegaly. Basilar cisterns remain patent. No intraventricular hemorrhage. IMPRESSION: 1. Left  frontal lobe 2-3 cm area of cytotoxic edema (image 22) compatible with small acute/evolving left MCA territory infarct, as was questioned on today earlier CT. No parenchymal hemorrhage or significant associated mass effect. 2. Interval regression of the mixed density left subdural hematoma. Decreased rightward midline shift, now 5 mm. 3. No other new intracranial abnormality. Electronically Signed   By: Genevie Ann M.D.   On: 02/18/2016 15:06   Dg Chest Port 1 View  02/21/2016  CLINICAL DATA:  Atelectasis. EXAM: PORTABLE CHEST 1 VIEW COMPARISON:  February 15, 2016. FINDINGS: Stable cardiomediastinal silhouette. No pneumothorax is noted. Bony thorax is intact. Minimal mild bibasilar subsegmental atelectasis is noted. IMPRESSION: Minimal bibasilar subsegmental atelectasis. Electronically Signed   By: Marijo Conception, M.D.   On: 02/21/2016 07:38   Dg Abd Portable 1v  02/20/2016  CLINICAL DATA:  Feeding tube placement EXAM: PORTABLE ABDOMEN - 1 VIEW COMPARISON:  None. FINDINGS: There is normal small bowel gas pattern. Postcholecystectomy surgical clips are noted. There is NG feeding tube with tip in distal duodenum. IMPRESSION: NG feeding tube in place with tip in distal duodenum. Electronically Signed   By: Lahoma Crocker M.D.   On: 02/20/2016 15:47     Assessment and plan:   Carolyn [name] is an 82 y.o. female patient with left hemispheric convexity subdural hematoma, status post partial evacuation, with dysarthria and aphasia, right facial weakness and right hemiparesis, had altered mental status with poor responsiveness, which is likely due to a combination of left hemispheric edema from traumatic head injury, focal hypodensity seen in the left periinsular cortex and subcortical area suggestive of an acute to subacute infarct, and also from possible subclinical seizures in the past. She has been doing better clinically, more alert today than before. No definite clinical events suggestive of seizures noted  currently. She is on Keppra 1 gm twice a day. EEG showed a few occasional left frontoparietal abnormal discharges suggestive of an underlying epileptogenic potential. Hence recommend slightly increasing the Keppra dose to 1250 mg twice a day. Continue physical and occupational therapy.  Discussed her neurological assessment and seizure management at length with her daughter, who is a OR nurse in the hospital, answered several of her questions to her satisfaction.  We'll follow-up.

## 2016-02-23 ENCOUNTER — Inpatient Hospital Stay (HOSPITAL_COMMUNITY): Payer: PPO

## 2016-02-23 DIAGNOSIS — Z9889 Other specified postprocedural states: Secondary | ICD-10-CM | POA: Diagnosis not present

## 2016-02-23 DIAGNOSIS — R569 Unspecified convulsions: Secondary | ICD-10-CM | POA: Diagnosis not present

## 2016-02-23 DIAGNOSIS — I6200 Nontraumatic subdural hemorrhage, unspecified: Secondary | ICD-10-CM | POA: Diagnosis not present

## 2016-02-23 DIAGNOSIS — R4182 Altered mental status, unspecified: Secondary | ICD-10-CM | POA: Diagnosis not present

## 2016-02-23 LAB — GLUCOSE, CAPILLARY
Glucose-Capillary: 166 mg/dL — ABNORMAL HIGH (ref 65–99)
Glucose-Capillary: 157 mg/dL — ABNORMAL HIGH (ref 65–99)
Glucose-Capillary: 98 mg/dL (ref 65–99)
Glucose-Capillary: 183 mg/dL — ABNORMAL HIGH (ref 65–99)
Glucose-Capillary: 122 mg/dL — ABNORMAL HIGH (ref 65–99)
Glucose-Capillary: 103 mg/dL — ABNORMAL HIGH (ref 65–99)
Glucose-Capillary: 149 mg/dL — ABNORMAL HIGH (ref 65–99)

## 2016-02-23 NOTE — Progress Notes (Signed)
MBSS complete. Full report located under chart review in imaging section.  Darbie Biancardi Paiewonsky, M.A. CCC-SLP [phone]  

## 2016-02-23 NOTE — Progress Notes (Signed)
Patient ID: Carolyn [name], female   DOB: May 28, 1933, 82 y.o.   MRN: [id number] [place] Abts but still expressively dysphasic this morning  Moves all extremities well left greater than right with a baseline right hemiparesis. Incision clean dry and intact  Continue PT/OT/ST

## 2016-02-24 LAB — GLUCOSE, CAPILLARY
Glucose-Capillary: 150 mg/dL — ABNORMAL HIGH (ref 65–99)
Glucose-Capillary: 110 mg/dL — ABNORMAL HIGH (ref 65–99)
Glucose-Capillary: 190 mg/dL — ABNORMAL HIGH (ref 65–99)
Glucose-Capillary: 190 mg/dL — ABNORMAL HIGH (ref 65–99)
Glucose-Capillary: 127 mg/dL — ABNORMAL HIGH (ref 65–99)
Glucose-Capillary: 148 mg/dL — ABNORMAL HIGH (ref 65–99)

## 2016-02-24 LAB — URINALYSIS, ROUTINE W REFLEX MICROSCOPIC
Bilirubin Urine: NEGATIVE
Glucose, UA: NEGATIVE mg/dL
Hgb urine dipstick: NEGATIVE
Ketones, ur: NEGATIVE mg/dL
Nitrite: POSITIVE — AB
Protein, ur: NEGATIVE mg/dL
Specific Gravity, Urine: 1.014 (ref 1.005–1.030)
pH: 7 (ref 5.0–8.0)

## 2016-02-24 LAB — URINE MICROSCOPIC-ADD ON: RBC / HPF: NONE SEEN RBC/hpf (ref 0–5)

## 2016-02-24 MED ORDER — DEXTROSE 5 % IV SOLN
1.0000 g | INTRAVENOUS | Status: DC
  Administered 2016-02-24 – 2016-02-25 (×2): 1 g via INTRAVENOUS
  Filled 2016-02-24 (×3): qty 10

## 2016-02-24 NOTE — Progress Notes (Signed)
I will begin authorization for a possible readmission to inpt rehab when pt felt medically ready by Dr. Cyndy Freeze. Readmit pending insurance approval. I will follow up tomorrow. [id number]

## 2016-02-24 NOTE — Progress Notes (Signed)
Physician notified: Ditty  Regarding: Pt having frequency, malodorous urine, and cloudy. OK to send UA? Order(s): send UA  Physician notified: Ditty At: 0622 Regarding: Pt UA positive for many bacteria, WBC and leukocytes.  Order(s): Rocephin IVPB 1gram Q24hours for UTI.

## 2016-02-24 NOTE — Progress Notes (Signed)
Speech Language Pathology Treatment: Dysphagia;Cognitive-Linquistic  Patient Details Name: Carolyn [name] MRN: [id number] DOB: Jun 30, 1933 Today's Date: 02/24/2016 Time: [id number] SLP Time Calculation (min) (ACUTE ONLY): 21 min  Assessment / Plan / Recommendation Clinical Impression  SLP provided skilled observation at mealtime to determine diet tolerance. Oral holding x1 observed. Pt frequently cleared throat during intake, however during MBS no penetration/aspiration observed during these episodes. Seemingly timely and efficient swallow initiation. Pt continues to be distractible, however easily redirected with min verbal cues to attend. Basic functional problem solving skills demonstrated throughout mealtime (opening and holding containers, finding spoon, etc.). Pt more expressive during today's session and able to count to 10 with mod verbal and visual cues. Pt able to ask when SLP will be back as well as comment on environment. Pt and daughter educated re: aspiration precautions and continued f/u by SLP. Will continue to follow to provide skilled dysphagia and aphasia intervention.   HPI HPI: 82 y.o. female with h/o DM, HTN, COPD, asthma and chronic kidney disease, who underwent craniotomy x2 for evacuation of chronic SDH parieto-occipital subdural hematoma. Pt was previously admitted 3/6 after fall and was d/c home next day. Pt was readmitted 3/22 due to headaches and neuro changes, then d/c to [facility] 3/29. Pt currently admitted from [facility] due to neurological decline. Pt has been receiving SLP intervention while in house, focusing on comprehension, expression, social interaction, problem solving and memory. CT Head 4/4 L frontal lobe 2-3 cm area of cytotoxic edema compatible with small acute/evolving L MCA territory infarct. CXR 4/1 no active cardiopulmonary disease.      SLP Plan  Continue with current plan of care     Recommendations  Diet recommendations: Dysphagia 1 (puree);Thin liquid Liquids  provided via: Cup;Straw Medication Administration: Crushed with puree Supervision: Patient able to self feed;Staff to assist with self feeding;Full supervision/cueing for compensatory strategies Compensations: Minimize environmental distractions;Slow rate;Small sips/bites;Follow solids with liquid Postural Changes and/or Swallow Maneuvers: Seated upright 90 degrees             Oral Care Recommendations: Oral care BID Follow up Recommendations: Inpatient Rehab Plan: Continue with current plan of care     Levoy Geisen, Student-SLP             [facility] 02/24/2016, 10:28 AM

## 2016-02-24 NOTE — Progress Notes (Signed)
Interval History:                                                                                                                      Carolyn [name] is an 82 y.o. female patient with  SDH, seizures. Doing better day by day, improving rigt sided strength, follows commands better, more alert. No sz.  No family at bedside today.    No new neuro sx.   Past Medical History: Past Medical History  Diagnosis Date  . Diabetes mellitus   . Hypertension   . Hypercholesterolemia   . Gallstones   . Glaucoma   . Cataracts, bilateral   . COPD (chronic obstructive pulmonary disease) ([place])   . Hypothyroid   . PONV (postoperative nausea and vomiting)   . Asthma   . Chronic kidney disease 2007    kidney cancer and partial nephrctomy-right  . Subdural bleeding [facility])     Past Surgical History  Procedure Laterality Date  . Dilation and curettage of uterus    . Partial nephrectomy      cancer  . Breast biopsy    . D&c for enlarged uterine  210  . Total abdominal hysterectomy w/ bilateral salpingoophorectomy  2011  . Cateract extractions and iol    . Cholecystectomy  2008  . Abdominal hysterectomy    . Eye surgery  08/2011,09/2012    bilateral cataracts  . Hernia repair  01/19/12    inguinal  . Inguinal hernia repair  01/19/2012    Procedure: HERNIA REPAIR INGUINAL ADULT;  Surgeon: Earnstine Regal, MD;  Location: [facility];  Service: General;  Laterality: Left;  repair left inguinal hernia with mesh   . Craniotomy Left 02/06/2016    Procedure: CRANIOTOMY HEMATOMA EVACUATION SUBDURAL;  Surgeon: Kevan Ny Ditty, MD;  Location: [facility];  Service: Neurosurgery;  Laterality: Left;  . Craniotomy N/A 02/17/2016    Procedure: CRANIOTOMY HEMATOMA EVACUATION SUBDURAL;  Surgeon: Kevan Ny Ditty, MD;  Location: [place] NEURO ORS;  Service: Neurosurgery;  Laterality: N/A;  left  . Craniotomy Left 02/18/2016    Procedure: CRANIOTOMY HEMATOMA EVACUATION SUBDURAL;  Surgeon: Earnie Larsson, MD;  Location: [place] NEURO  ORS;  Service: Neurosurgery;  Laterality: Left;    Family History: Family History  Problem Relation Age of Onset  . Cancer      Social History:   reports that she quit smoking about 34 years ago. Her smoking use included Cigarettes. She has a 5 pack-year smoking history. She has never used smokeless tobacco. She reports that she does not drink alcohol or use illicit drugs.  Allergies:  Allergies  Allergen Reactions  . Darvocet [Propoxyphene N-Acetaminophen] Nausea And Vomiting  . Metformin And Related Diarrhea  . Percocet [Oxycodone-Acetaminophen] Nausea And Vomiting  . Vicodin [Hydrocodone-Acetaminophen] Itching     Medications:  Current facility-administered medications:  .  0.9 %  sodium chloride infusion, , Intravenous, Continuous, Raylene Miyamoto, MD, Last Rate: 10 mL/hr at 02/22/16 1300 .  albuterol (PROVENTIL) (2.5 MG/3ML) 0.083% nebulizer solution 2.5 mg, 2.5 mg, Nebulization, Q4H PRN, Kevan Ny Ditty, MD .  antiseptic oral rinse (CPC / CETYLPYRIDINIUM CHLORIDE 0.05%) solution 7 mL, 7 mL, Mouth Rinse, BID, Kevan Ny Ditty, MD, 7 mL at 02/23/16 2125 .  bisacodyl (DULCOLAX) EC tablet 5 mg, 5 mg, Oral, Daily PRN, Kevan Ny Ditty, MD .  docusate (COLACE) 50 MG/5ML liquid 100 mg, 100 mg, Oral, BID, Kevan Ny Ditty, MD, 100 mg at 02/23/16 2125 .  feeding supplement (JEVITY 1.2 CAL) liquid 1,000 mL, 1,000 mL, Per Tube, Continuous, Raylene Miyamoto, MD, Last Rate: 55 mL/hr at 02/24/16 0502, 1,000 mL at 02/24/16 0502 .  glimepiride (AMARYL) tablet 4 mg, 4 mg, Oral, QHS, Kevan Ny Ditty, MD, 4 mg at 02/23/16 2126 .  hydrALAZINE (APRESOLINE) injection 10-40 mg, 10-40 mg, Intravenous, Q1H PRN, Kara Mead V, MD, 20 mg at 02/22/16 1228 .  HYDROmorphone (DILAUDID) injection 0.5-1 mg, 0.5-1 mg, Intravenous, Q2H PRN, Kevan Ny Ditty, MD,  0.5 mg at 02/18/16 2010 .  insulin aspart (novoLOG) injection 0-15 Units, 0-15 Units, Subcutaneous, 6 times per day, Tamala Fothergill, MD, 2 Units at 02/24/16 0054 .  latanoprost (XALATAN) 0.005 % ophthalmic solution 1 drop, 1 drop, Both Eyes, QHS, Kevan Ny Ditty, MD, 1 drop at 02/23/16 2126 .  levETIRAcetam (KEPPRA) 1,250 mg in sodium chloride 0.9 % 100 mL IVPB, 1,250 mg, Intravenous, Q12H, Kelsey Durflinger Fuller Mandril, MD, 1,250 mg at 02/23/16 2127 .  lisinopril (PRINIVIL,ZESTRIL) tablet 20 mg, 20 mg, Oral, QHS, Kevan Ny Ditty, MD, 20 mg at 02/23/16 2127 .  metoprolol (LOPRESSOR) injection 5 mg, 5 mg, Intravenous, 4 times per day, Rush Farmer, MD, 5 mg at 02/24/16 0507 .  mometasone-formoterol (DULERA) 200-5 MCG/ACT inhaler 2 puff, 2 puff, Inhalation, BID, Kevan Ny Ditty, MD, 2 puff at 02/22/16 2238 .  naloxone [facility]) injection 0.08 mg, 0.08 mg, Intravenous, PRN, Kevan Ny Ditty, MD .  ondansetron [facility]) tablet 4 mg, 4 mg, Oral, Q4H PRN **OR** ondansetron (ZOFRAN) injection 4 mg, 4 mg, Intravenous, Q4H PRN, Kevan Ny Ditty, MD, 4 mg at 02/18/16 0311 .  pantoprazole (PROTONIX) injection 40 mg, 40 mg, Intravenous, QHS, Kevan Ny Ditty, MD, 40 mg at 02/23/16 2127 .  polyethylene glycol (MIRALAX / GLYCOLAX) packet 17 g, 17 g, Oral, Daily PRN, Kevan Ny Ditty, MD .  promethazine (PHENERGAN) tablet 12.5-25 mg, 12.5-25 mg, Oral, Q4H PRN, Kevan Ny Ditty, MD .  senna [facility]) tablet 8.6 mg, 1 tablet, Oral, BID, Kevan Ny Ditty, MD, 8.6 mg at 02/23/16 2127 .  thyroid (ARMOUR) tablet 60 mg, 60 mg, Oral, QHS, Kevan Ny Ditty, MD, 60 mg at 02/23/16 2126 .  verapamil (CALAN) tablet 40 mg, 40 mg, Oral, 3 times per day, Ritta Slot, NP, 40 mg at 02/24/16 0507   Neurologic Examination:  Today's Vitals   02/23/16 1905 02/23/16 2303 02/23/16 2305  02/24/16 0417  BP: 123/61  127/58   Pulse: 55  61   Temp:  98.5 F (36.9 C)  98 F (36.7 C)  TempSrc:  Oral  Oral  Resp: 14  23   Weight:      SpO2: 96%  92%   PainSc:        Evaluation of higher integrative functions including: Level of alertness: Alert,  Appears Oriented to  place and person Speech: motor aphasia noted, follows commands  Test the following cranial nerves: 2-12 grossly appear intact Motor examination: no drift, minimal weakness on right UE/LE compared to left   No abnormal involuntary movements or tremors noted.    Lab Results: Basic Metabolic Panel:  Recent Labs Lab 02/18/16 0424 02/18/16 0604 02/20/16 0258 02/21/16 0400 02/22/16 0244  NA 140 138 141 141 140  K 3.3* 3.2* 3.0* 3.3* 3.8  CL 106  --  110 107 103  CO2 21*  --  22 26 27   GLUCOSE 259* 254* 131* 153* 171*  BUN 7  --  5* 6 <5*  CREATININE 0.82  --  0.57 0.47 0.53  CALCIUM 9.8  --  9.0 8.6* 8.9  MG  --   --  1.7 2.4  --   PHOS  --   --  1.3* 1.8* 2.3*    Liver Function Tests: No results for input(s): AST, ALT, ALKPHOS, BILITOT, PROT, ALBUMIN in the last 168 hours. No results for input(s): LIPASE, AMYLASE in the last 168 hours. No results for input(s): AMMONIA in the last 168 hours.  CBC:  Recent Labs Lab 02/17/16 1347 02/18/16 0424 02/18/16 0604 02/20/16 0258  WBC  --  16.1*  --  11.4*  HGB 12.2 11.8* 10.5* 9.3*  HCT 36.0 35.6* 31.0* 29.3*  MCV  --  85.6  --  87.2  PLT  --  217  --  180    Cardiac Enzymes: No results for input(s): CKTOTAL, CKMB, CKMBINDEX, TROPONINI in the last 168 hours.  Lipid Panel: No results for input(s): CHOL, TRIG, HDL, CHOLHDL, VLDL, LDLCALC in the last 168 hours.  CBG:  Recent Labs Lab 02/23/16 1315 02/23/16 1715 02/23/16 1909 02/23/16 2302 02/24/16 0419  GLUCAP 183* 122* 103* 149* 110*    Microbiology: Results for orders placed or performed during the hospital encounter of 02/12/16  Urine culture     Status: None   Collection  Time: 02/13/16  7:03 PM  Result Value Ref Range Status   Specimen Description URINE, CLEAN CATCH  Final   Special Requests NONE  Final   Culture MULTIPLE SPECIES PRESENT, SUGGEST RECOLLECTION  Final   Report Status 02/14/2016 FINAL  Final  Urine culture     Status: None   Collection Time: 02/15/16  6:25 PM  Result Value Ref Range Status   Specimen Description URINE, CATHETERIZED  Final   Special Requests NONE  Final   Culture NO GROWTH 2 DAYS  Final   Report Status 02/17/2016 FINAL  Final    Imaging: Dg Chest Port 1 View  02/21/2016  CLINICAL DATA:  Atelectasis. EXAM: PORTABLE CHEST 1 VIEW COMPARISON:  February 15, 2016. FINDINGS: Stable cardiomediastinal silhouette. No pneumothorax is noted. Bony thorax is intact. Minimal mild bibasilar subsegmental atelectasis is noted. IMPRESSION: Minimal bibasilar subsegmental atelectasis. Electronically Signed   By: Marijo Conception, M.D.   On: 02/21/2016 07:38   Dg Abd Portable 1v  02/20/2016  CLINICAL DATA:  Feeding tube placement EXAM: PORTABLE ABDOMEN - 1 VIEW COMPARISON:  None. FINDINGS: There is normal small bowel gas pattern. Postcholecystectomy surgical clips are noted. There is NG feeding tube with tip in distal duodenum. IMPRESSION: NG feeding tube in place with tip in distal duodenum. Electronically Signed   By: Lahoma Crocker M.D.   On: 02/20/2016 15:47   Dg Swallowing Func-speech Pathology  02/23/2016  Objective Swallowing Evaluation: Type of Study: MBS-Modified Barium Swallow Study Patient Details Name: Carolyn [name] MRN: [id number] Date of Birth: [birth year] Today's Date: 02/23/2016 Time: SLP Start Time (ACUTE ONLY): 1201-SLP Stop Time (ACUTE ONLY): 1214 SLP Time Calculation (min) (ACUTE ONLY): 13 min Past Medical History: Past Medical History Diagnosis Date . Diabetes mellitus  . Hypertension  . Hypercholesterolemia  . Gallstones  . Glaucoma  . Cataracts, bilateral  . COPD (chronic obstructive pulmonary disease) ([place])  . Hypothyroid  . PONV (postoperative  nausea and vomiting)  . Asthma  . Chronic kidney disease 2007   kidney cancer and partial nephrctomy-right . Subdural bleeding [facility])  Past Surgical History: Past Surgical History Procedure Laterality Date . Dilation and curettage of uterus   . Partial nephrectomy     cancer . Breast biopsy   . D&c for enlarged uterine  210 . Total abdominal hysterectomy w/ bilateral salpingoophorectomy  2011 . Cateract extractions and iol   . Cholecystectomy  2008 . Abdominal hysterectomy   . Eye surgery  08/2011,09/2012   bilateral cataracts . Hernia repair  01/19/12   inguinal . Inguinal hernia repair  01/19/2012   Procedure: HERNIA REPAIR INGUINAL ADULT;  Surgeon: Earnstine Regal, MD;  Location: [facility];  Service: General;  Laterality: Left;  repair left inguinal hernia with mesh  . Craniotomy Left 02/06/2016   Procedure: CRANIOTOMY HEMATOMA EVACUATION SUBDURAL;  Surgeon: Kevan Ny Ditty, MD;  Location: [facility];  Service: Neurosurgery;  Laterality: Left; . Craniotomy N/A 02/17/2016   Procedure: CRANIOTOMY HEMATOMA EVACUATION SUBDURAL;  Surgeon: Kevan Ny Ditty, MD;  Location: [place] NEURO ORS;  Service: Neurosurgery;  Laterality: N/A;  left . Craniotomy Left 02/18/2016   Procedure: CRANIOTOMY HEMATOMA EVACUATION SUBDURAL;  Surgeon: Earnie Larsson, MD;  Location: [place] NEURO ORS;  Service: Neurosurgery;  Laterality: Left; HPI: 82 y.o. female with h/o DM, HTN, COPD, asthma and chronic kidney disease, who underwent craniotomy x2 for evacuation of chronic SDH parieto-occipital subdural hematoma. Pt was previously admitted 3/6 after fall and was d/c home next day. Pt was readmitted 3/22 due to headaches and neuro changes, then d/c to CIR 3/29. Pt currently admitted from CIR due to neurological decline. Pt has been receiving SLP intervention while in house, focusing on comprehension, expression, social interaction, problem solving and memory. CT Head 4/4 L frontal lobe 2-3 cm area of cytotoxic edema compatible with small acute/evolving L MCA  territory infarct. CXR 4/1 no active cardiopulmonary disease. Subjective: pt alert, aphasic Assessment / Plan / Recommendation CHL IP CLINICAL IMPRESSIONS 02/23/2016 Therapy Diagnosis Moderate oral phase dysphagia;Mild pharyngeal phase dysphagia Clinical Impression Pt has a moderate oral and mild pharyngeal dysphagia with no aspiration observed across challenging. She has piecemeal swallowing and slow oral transit with all consistencies tested, as well as significantly prolonged mastication with soft solids. Once boluses reach her pharynx, she is able to trigger a reflexive swallow with good timing and adequate pharyngeal strength, with only mild residue remaining in the valleculae with purees and soft solids. Of note, pt had intermittent throat clearing throughout the study that was not associated with  any airway penetration. Recommend Dys 1 diet for ease of oral preparation and thin liquids, meds crushed in puree. Will continue to follow for tolerance and advancement. Impact on safety and function Mild aspiration risk   CHL IP TREATMENT RECOMMENDATION 02/23/2016 Treatment Recommendations Therapy as outlined in treatment plan below   Prognosis 02/23/2016 Prognosis for Safe Diet Advancement Good Barriers to Reach Goals -- Barriers/Prognosis Comment -- CHL IP DIET RECOMMENDATION 02/23/2016 SLP Diet Recommendations Dysphagia 1 (Puree) solids;Thin liquid Liquid Administration via Cup;Straw Medication Administration Crushed with puree Compensations Minimize environmental distractions;Slow rate;Small sips/bites;Follow solids with liquid Postural Changes Remain semi-upright after after feeds/meals (Comment);Seated upright at 90 degrees   CHL IP OTHER RECOMMENDATIONS 02/23/2016 Recommended Consults -- Oral Care Recommendations Oral care BID Other Recommendations --   CHL IP FOLLOW UP RECOMMENDATIONS 02/23/2016 Follow up Recommendations Inpatient Rehab   CHL IP FREQUENCY AND DURATION 02/23/2016 Speech Therapy Frequency (ACUTE ONLY) min  2x/week Treatment Duration 2 weeks      CHL IP ORAL PHASE 02/23/2016 Oral Phase Impaired Oral - Pudding Teaspoon -- Oral - Pudding Cup -- Oral - Honey Teaspoon -- Oral - Honey Cup -- Oral - Nectar Teaspoon Delayed oral transit;Piecemeal swallowing Oral - Nectar Cup Delayed oral transit;Piecemeal swallowing Oral - Nectar Straw -- Oral - Thin Teaspoon -- Oral - Thin Cup Delayed oral transit;Piecemeal swallowing Oral - Thin Straw Delayed oral transit;Piecemeal swallowing Oral - Puree Delayed oral transit;Piecemeal swallowing Oral - Mech Soft Delayed oral transit;Piecemeal swallowing;Impaired mastication Oral - Regular -- Oral - Multi-Consistency -- Oral - Pill -- Oral Phase - Comment --  CHL IP PHARYNGEAL PHASE 02/23/2016 Pharyngeal Phase Impaired Pharyngeal- Pudding Teaspoon -- Pharyngeal -- Pharyngeal- Pudding Cup -- Pharyngeal -- Pharyngeal- Honey Teaspoon -- Pharyngeal -- Pharyngeal- Honey Cup -- Pharyngeal -- Pharyngeal- Nectar Teaspoon Reduced epiglottic inversion;Pharyngeal residue - valleculae;Penetration/Aspiration during swallow;Reduced tongue base retraction Pharyngeal Material enters airway, remains ABOVE vocal cords then ejected out Pharyngeal- Nectar Cup Reduced tongue base retraction Pharyngeal -- Pharyngeal- Nectar Straw -- Pharyngeal -- Pharyngeal- Thin Teaspoon -- Pharyngeal -- Pharyngeal- Thin Cup Reduced tongue base retraction Pharyngeal -- Pharyngeal- Thin Straw Reduced tongue base retraction Pharyngeal -- Pharyngeal- Puree Reduced tongue base retraction;Pharyngeal residue - valleculae Pharyngeal -- Pharyngeal- Mechanical Soft Reduced tongue base retraction;Pharyngeal residue - valleculae Pharyngeal -- Pharyngeal- Regular -- Pharyngeal -- Pharyngeal- Multi-consistency -- Pharyngeal -- Pharyngeal- Pill -- Pharyngeal -- Pharyngeal Comment --  CHL IP CERVICAL ESOPHAGEAL PHASE 02/23/2016 Cervical Esophageal Phase WFL Pudding Teaspoon -- Pudding Cup -- Honey Teaspoon -- Honey Cup -- Nectar Teaspoon -- Nectar  Cup -- Nectar Straw -- Thin Teaspoon -- Thin Cup -- Thin Straw -- Puree -- Mechanical Soft -- Regular -- Multi-consistency -- Pill -- Cervical Esophageal Comment -- No flowsheet data found. Germain Osgood, M.A. CCC-SLP [phone] Germain Osgood 02/23/2016, 1:28 PM               Assessment and plan:    Carolyn [name] is an 82 y.o. female patient with  SDH, seizures. Doing better day by day, improving right sided strength, follows commands better, more alert. No sz.  Continue keppra 1250 mg BID.  No new neuro sx. No further recs.  Will sign off.

## 2016-02-24 NOTE — Progress Notes (Signed)
Physical Therapy Treatment Patient Details Name: Carolyn [name] MRN: [id number] DOB: 16-May-1933 Today's Date: 02/24/2016    History of Present Illness Pt admitted from CIR due to neurological decline.  He underwent craniotomy x 2 for evacuation of chronic SDH Lt parieto-occipital subdural hemoatoma.   Pt suffered fall 3/6 after fall.  She sustained SDH and was discharged home 3/7.  She was readmitted 02/05/16  due to headaches and neuro changes.  She underwent craniotomy 02/06/16.  She was discharged to [facility] 3./29/17.  PMH includes:  DM, HTN, Glaucoma, COPD, Asthma, Kidney CA.  She sustained Lt radius fracture initial fall 01/20/16.     PT Comments    Wim[place]ly made good progress today, using RW and mod assist to stand and pivot to chair. She fatigued quickly but was very willing to work w/ therapy.  Follow Up Recommendations  CIR     Equipment Recommendations  None recommended by PT    Recommendations for Other Services       Precautions / Restrictions Precautions Precautions: Fall Restrictions Weight Bearing Restrictions: No    Mobility  Bed Mobility Overal bed mobility: Needs Assistance Bed Mobility: Supine to Sit     Supine to sit: Min assist;HOB elevated     General bed mobility comments: Increased time w/ use of bed rail and HOB elevated.  Min assist to scoot to EOB w/ use of bed pad.  Transfers Overall transfer level: Needs assistance Equipment used: Rolling walker (2 wheeled) Transfers: Sit to/from [facility] Sit to Stand: +2 physical assistance;Mod assist;From elevated surface Stand pivot transfers: Min assist;+2 safety/equipment       General transfer comment: Cues for hand placement and safe technique using RW.  Pt slow to rise and requires assist to boost w/ verbal cues to stand upright. Additional assist managing RW w/ pivot and positional cues provided to back up to chair before sitting.   Ambulation/Gait             General Gait Details:   After pivoting pt declined ambulating, letting out deep sigh when asked.   Stairs            Wheelchair Mobility    Modified Rankin (Stroke Patients Only)       Balance Overall balance assessment: Needs assistance Sitting-balance support: Bilateral upper extremity supported;Feet supported Sitting balance-Leahy Scale: Poor Sitting balance - Comments: Needs UE support and unable to accept balance challenges without A.     Standing balance support: Bilateral upper extremity supported;During functional activity Standing balance-Leahy Scale: Poor Standing balance comment: Relies on RW and outside assist for support                    Cognition Arousal/Alertness: Awake/alert Behavior During Therapy: Flat affect Overall Cognitive Status: Impaired/Different from baseline Area of Impairment: Following commands;Attention   Current Attention Level: Selective   Following Commands: Follows one step commands with increased time       General Comments: Pt attempted to verbalize today and is inconsistently successful reporting her name and the color of therapists scrubs.    Exercises General Exercises - Lower Extremity Ankle Circles/Pumps: AROM;Both;10 reps;Supine Short Arc Quad: AROM;Both;5 reps;Seated    General Comments        Pertinent Vitals/Pain Pain Assessment: No/denies pain Faces Pain Scale: No hurt Pain Intervention(s): Limited activity within patient's tolerance;Monitored during session;Repositioned    Home Living  Prior Function            PT Goals (current goals can now be found in the care plan section) Acute Rehab PT Goals Patient Stated Goal: unable to state PT Goal Formulation: With patient/family Time For Goal Achievement: 03/04/16 Potential to Achieve Goals: Good Progress towards PT goals: Progressing toward goals    Frequency  Min 3X/week    PT Plan Current plan remains appropriate    Co-evaluation              End of Session Equipment Utilized During Treatment: Gait belt Activity Tolerance: Patient tolerated treatment well;Patient limited by fatigue Patient left: in chair;with call bell/phone within reach;with chair alarm set;with family/visitor present     Time: 1430-1456 PT Time Calculation (min) (ACUTE ONLY): 26 min  Charges:  $Therapeutic Exercise: 8-22 mins $Therapeutic Activity: 8-22 mins                    G Codes:      Collie Siad PT, DPT  Pager: [phone] Phone: [phone] 02/24/2016, 4:19 PM

## 2016-02-24 NOTE — Progress Notes (Signed)
No acute events AVSS Awake and alert Expressive dysphasia improving Follows command bilaterally Incision looks good Stable Continue therapy

## 2016-02-24 NOTE — Care Management Important Message (Signed)
Important Message  Patient Details  Name: Carolyn [name] MRN: [id number] Date of Birth: 29-Apr-1933   Medicare Important Message Given:  Yes    Nathen May 02/24/2016, 12:40 PM

## 2016-02-25 LAB — GLUCOSE, CAPILLARY
Glucose-Capillary: 136 mg/dL — ABNORMAL HIGH (ref 65–99)
Glucose-Capillary: 140 mg/dL — ABNORMAL HIGH (ref 65–99)
Glucose-Capillary: 149 mg/dL — ABNORMAL HIGH (ref 65–99)
Glucose-Capillary: 178 mg/dL — ABNORMAL HIGH (ref 65–99)
Glucose-Capillary: 179 mg/dL — ABNORMAL HIGH (ref 65–99)
Glucose-Capillary: 180 mg/dL — ABNORMAL HIGH (ref 65–99)
Glucose-Capillary: 204 mg/dL — ABNORMAL HIGH (ref 65–99)

## 2016-02-25 LAB — THEOPHYLLINE LEVEL: Theophylline Lvl: 1.2 ug/mL — ABNORMAL LOW (ref 10.0–20.0)

## 2016-02-25 MED ORDER — ENSURE ENLIVE PO LIQD
237.0000 mL | Freq: Two times a day (BID) | ORAL | Status: DC
  Administered 2016-02-25: 237 mL via ORAL

## 2016-02-25 MED ORDER — LEVETIRACETAM 250 MG PO TABS: 1250.0000 mg | ORAL_TABLET | Freq: Two times a day (BID) | ORAL | Status: DC

## 2016-02-25 MED ORDER — PANTOPRAZOLE SODIUM 40 MG PO PACK
40.0000 mg | PACK | Freq: Every day | ORAL | Status: DC
  Administered 2016-02-25: 40 mg
  Filled 2016-02-25: qty 20

## 2016-02-25 MED ORDER — FENTANYL CITRATE (PF) 100 MCG/2ML IJ SOLN
INTRAMUSCULAR | Status: AC
  Filled 2016-02-25: qty 2

## 2016-02-25 MED ORDER — DEXTROSE 5 % IV SOLN: 1.0000 g | INTRAVENOUS | Status: DC

## 2016-02-25 MED ORDER — METOPROLOL TARTRATE 25 MG PO TABS: 25.0000 mg | ORAL_TABLET | Freq: Two times a day (BID) | ORAL | Status: DC

## 2016-02-25 MED ORDER — LEVETIRACETAM 100 MG/ML PO SOLN
1250.0000 mg | Freq: Two times a day (BID) | ORAL | Status: DC
  Administered 2016-02-25 – 2016-02-26 (×3): 1250 mg via ORAL
  Filled 2016-02-25 (×3): qty 15

## 2016-02-25 MED ORDER — METOPROLOL TARTRATE 25 MG PO TABS
25.0000 mg | ORAL_TABLET | Freq: Two times a day (BID) | ORAL | Status: DC
  Administered 2016-02-25 – 2016-02-26 (×3): 25 mg via ORAL
  Filled 2016-02-25 (×3): qty 1

## 2016-02-25 MED ORDER — ENSURE ENLIVE PO LIQD: 237.0000 mL | Freq: Two times a day (BID) | ORAL | Status: DC

## 2016-02-25 NOTE — Progress Notes (Signed)
I await insurance decision to readmit pt to inpt rehab. [phone]

## 2016-02-25 NOTE — Progress Notes (Addendum)
Nutrition Follow-up  DOCUMENTATION CODES:   Obesity unspecified  INTERVENTION:    Recommend change TF to nocturnal schedule as follows: Jevity 1.2 at 70 ml/h from 5 pm to 7 am (14 hours) to provide 980 ml, 54 gm protein, 794 ml free water daily.  Continue diet per SLP/MD  Add Ensure Enlive po BID, each supplement provides 350 kcal and 20 grams of protein  NUTRITION DIAGNOSIS:   Inadequate oral intake related to poor appetite as evidenced by meal completion < 50%.  Ongoing  GOAL:   Patient will meet greater than or equal to 90% of their needs  Met with TF  MONITOR:   PO intake, Supplement acceptance, Weight trends, TF tolerance, I & O's  REASON FOR ASSESSMENT:   Consult Enteral/tube feeding initiation and management  ASSESSMENT:   64 female with chronic left SDH , HTN who required left craniotomy 3/23 for evacuation of Left SDH and was extubated and did well ans was discharged to [facility] 3/29. While on rehab she developed decrease LOC and required evacuation of recurrent left SDH and readmitted to [facility] hospital. On 4/4 she again had decreased LOC and required craniotomy again with evacuation of left SDH and epidural hematoma. She is extubated and has right hemiplegia   Asked by RN to follow-up with patient because she is eating poorly. Diet has been advanced dysphagia 1 diet with thin liquids. RN thinks that the TF is making her full, so she does not want to eat anything at meal times. Discussed with patient's son, who reports that she has been eating bites of meals and that she does not have an appetite. She had some Ensure this morning. Patient working with PT and SLP during RD visit, so did not get to speak to her. Plans for transfer to CIR soon, maybe today. Patient is currently receiving Jevity 1.2 via NGT at 55 ml/h (1320 ml/day) to provide 1584 kcals, 73 gm protein, 1069 ml free water daily. Will try nocturnal TF schedule.  Diet Order:  DIET - DYS 1 Room service  appropriate?: Yes; Fluid consistency:: Thin Diet - low sodium heart healthy  Skin:  Reviewed, no issues  Last BM:  4/2  Height:   Ht Readings from Last 1 Encounters:  02/12/16 '5\' 6"'$  (1.676 m)    Weight:   Wt Readings from Last 1 Encounters:  02/25/16 201 lb 4.5 oz (91.3 kg)    Ideal Body Weight:  59 kg  BMI:  Body mass index is 32.5 kg/(m^2).  Estimated Nutritional Needs:   Kcal:  1500-1700  Protein:  75-85 grams  Fluid:  > 1.6 L/day  EDUCATION NEEDS:   No education needs identified at this time  Molli Barrows, [place], [place], [place] Pager [phone] After Hours Pager [phone]

## 2016-02-25 NOTE — Progress Notes (Signed)
PT Cancellation Note  Patient Details Name: Carolyn [name] MRN: [id number] DOB: June 01, 1933   Cancelled Treatment:    Reason Eval/Treat Not Completed: Fatigue/lethargy limiting ability to participate (pt declines therapy due to lethargy).  PT will continue to follow acutely.  Collie Siad PT, DPT  Pager: [phone] Phone: [phone] 02/25/2016, 1:55 PM

## 2016-02-25 NOTE — Care Management Note (Signed)
Case Management Note  Patient Details  Name: Carolyn [name] MRN: [id number] Date of Birth: 08-Jul-1933  Subjective/Objective:   Patient is cleared to go to CIR , but await insurance auth from insurance company to approve for CIR, CSW also following for back up.                 Action/Plan:   Expected Discharge Date:                  Expected Discharge Plan:  [place]  In-House Referral:     Discharge planning Services  CM Consult  Post Acute Care Choice:    Choice offered to:     DME Arranged:    DME Agency:     HH Arranged:    [place] Agency:     Status of Service:  Completed, signed off  Medicare Important Message Given:  Yes Date Medicare IM Given:    Medicare IM give by:    Date Additional Medicare IM Given:    Additional Medicare Important Message give by:     If discussed at [place] of Stay Meetings, dates discussed:    Additional Comments:  Zenon Mayo, RN 02/25/2016, 12:17 PM

## 2016-02-25 NOTE — Progress Notes (Signed)
Occupational Therapy Treatment Patient Details Name: Carolyn [name] MRN: [id number] DOB: 11/25/1932 Today's Date: 02/25/2016    History of present illness Pt admitted from CIR due to neurological decline.  He underwent craniotomy x 2 for evacuation of chronic SDH Lt parieto-occipital subdural hemoatoma.   Pt suffered fall 3/6 after fall.  She sustained SDH and was discharged home 3/7.  She was readmitted 02/05/16  due to headaches and neuro changes.  She underwent craniotomy 02/06/16.  She was discharged to [facility] 3./29/17.  PMH includes:  DM, HTN, Glaucoma, COPD, Asthma, Kidney CA.  She sustained Lt radius fracture initial fall 01/20/16.    OT comments  Pt able to complete x2 transfers this session with verbalization for bsc. Pt voiding bladder and bowel this session. Pt correctly responding to all commands demonstrating good receptive language. Pt attempting to speech and facial grimace when therapist unable to understand. SLP arriving and discussion of communication board completed at that time.    Follow Up Recommendations  CIR    Equipment Recommendations  3 in 1 bedside comode    Recommendations for Other Services Rehab consult    Precautions / Restrictions Precautions Precautions: Fall       Mobility Bed Mobility Overal bed mobility: Needs Assistance Bed Mobility: Supine to Sit     Supine to sit: Min guard     General bed mobility comments: pt able with incr time to progress to EOB. pt static sitting EOB min guard (A)  Transfers Overall transfer level: Needs assistance Equipment used: Rolling walker (2 wheeled) Transfers: Sit to/from Stand Sit to Stand: Min assist;+2 physical assistance Stand pivot transfers: Min assist;+2 physical assistance       General transfer comment: pt needed cues for safety    Balance Overall balance assessment: Needs assistance Sitting-balance support: Bilateral upper extremity supported;Feet supported Sitting balance-Leahy Scale: Fair      Standing balance support: Bilateral upper extremity supported;During functional activity Standing balance-Leahy Scale: Poor                     ADL Overall ADL's : Needs assistance/impaired                     Lower Body Dressing: Moderate assistance;Bed level Lower Body Dressing Details (indicate cue type and reason): pt able to don socks with incr time and effort after OT started socks over toes Toilet Transfer: Minimal assistance;+2 for physical assistance;+2 for safety/equipment;Stand-pivot Toilet Transfer Details (indicate cue type and reason): pt needed cues for safety and hand placement. pt needed (A) to power up into standin Toileting- Water quality scientist and Hygiene: Total assistance;Sit to/from stand Toileting - Clothing Manipulation Details (indicate cue type and reason): total (A) for peri care and max cues to transfer R not left       General ADL Comments: questions some R inattention based on decr visual attention to son on R side of bed and transfers x2 to the R side      Vision                     Perception     Praxis      Cognition   Behavior During Therapy: Flat affect Overall Cognitive Status: Impaired/Different from baseline Area of Impairment: Attention;Safety/judgement;Awareness   Current Attention Level: Sustained      Safety/Judgement: Decreased awareness of safety;Decreased awareness of deficits Awareness: Intellectual Problem Solving: Slow processing General Comments: Pt attempting to progress with transfer with  therapist attempting to transfer to chair not bed. pt needed max cue redirection. pt following commands demonstrating good receptive ability    Extremity/Trunk Assessment               Exercises     Shoulder Instructions       General Comments      Pertinent Vitals/ Pain       Pain Assessment: No/denies pain  Home Living                                          Prior  Functioning/Environment              Frequency Min 2X/week     Progress Toward Goals  OT Goals(current goals can now be found in the care plan section)  Progress towards OT goals: Progressing toward goals  Acute Rehab OT Goals Patient Stated Goal: unable to state OT Goal Formulation: With patient/family Time For Goal Achievement: 03/04/16 Potential to Achieve Goals: Good ADL Goals Pt Will Perform Grooming: with mod assist;sitting Pt Will Perform Upper Body Bathing: with mod assist;sitting Pt Will Perform Lower Body Bathing: with min assist;sit to/from stand Pt Will Transfer to Toilet: with mod assist;stand pivot transfer;bedside commode Pt Will Perform Toileting - Clothing Manipulation and hygiene: with min assist;sit to/from stand Pt/caregiver will Perform Home Exercise Program: Increased ROM;Left upper extremity;With minimal assist;With written HEP provided Additional ADL Goal #1: Pt will use Lt UE as an active assist 50% of time during ADLs  Additional ADL Goal #2: Pt will maintain sustained attention to simple ADL taks with min cues x 5 mins   Plan Discharge plan remains appropriate    Co-evaluation                 End of Session Equipment Utilized During Treatment: Gait belt;Rolling walker   Activity Tolerance Patient tolerated treatment well   Patient Left in chair;with call bell/phone within reach;with chair alarm set;with family/visitor present (SLP in room with patient)   Nurse Communication Mobility status;Precautions        Time: [id number] OT Time Calculation (min): 32 min  Charges: OT General Charges $OT Visit: 1 Procedure OT Treatments $Self Care/Home Management : 23-37 mins  Parke Poisson B 02/25/2016, 2:31 PM   Jeri Modena   OTR/L Pager: [phone] Office: [phone] .

## 2016-02-25 NOTE — Discharge Summary (Signed)
Date of Admission: 02/17/16  Date of Discharge: 02/25/16  Admission Diagnosis: Residual subdural hematoma  Discharge Diagnosis: Same, venous infarction  Procedure Performed: Re-do left craniotomy for subdural hematoma x2  Attending: [facility] Course:  The patient was admitted from inpatient rehab.  She was taken to the OR for the first of the above listed operations.  During the night after her operation she had a neurological decline and her CT scan showed re-accumulation of acute blood.  She was taken back to the OR for evacuation.  She had some hemiparesis and dysphasia post-operatively but this is improving.  Her Keppra was increased by Neurology due to EEG findings.  She was started on rocephin for a UTI the day prior to discharge.  Follow up: 2-3 weeks    Medication List    ASK your doctor about these medications        Fluticasone-Salmeterol 250-50 MCG/DOSE Aepb  Commonly known as:  ADVAIR  Inhale 1 puff into the lungs 2 (two) times daily as needed (for wheezing/shortness of breath).     glimepiride 2 MG tablet  Commonly known as:  AMARYL  Take 4 mg by mouth at bedtime.     latanoprost 0.005 % ophthalmic solution  Commonly known as:  XALATAN  Place 1 drop into both eyes at bedtime.     levETIRAcetam 1000 MG tablet  Commonly known as:  KEPPRA  Take 1 tablet (1,000 mg total) by mouth 2 (two) times daily.     lisinopril 20 MG tablet  Commonly known as:  PRINIVIL,ZESTRIL  Take 20 mg by mouth at bedtime.     pirbuterol 200 MCG/INH inhaler  Commonly known as:  MAXAIR  Inhale 2 puffs into the lungs 4 (four) times daily as needed for wheezing or shortness of breath.     polyethylene glycol packet  Commonly known as:  MIRALAX / GLYCOLAX  Take 17 g by mouth daily as needed for mild constipation.     theophylline 400 MG 24 hr tablet  Commonly known as:  UNIPHYL  Take 400 mg by mouth at bedtime.     thyroid 60 MG tablet  Commonly known as:  ARMOUR  Take 60 mg by  mouth at bedtime.     verapamil 180 MG CR tablet  Commonly known as:  CALAN-SR  Take 180 mg by mouth at bedtime.

## 2016-02-25 NOTE — H&P (Signed)
Physical Medicine and Rehabilitation Admission H&P    : HPI: Carolyn [name] is a 82 y.o. right handed female with history of hypertension, diabetes mellitus, COPD. By chart review patient lives with husband and daughter in [place]. She used a walker prior to admission. One level home with 2 steps to entry. Daughter is an Therapist, sports who is a full-time caregiver for both the patient and the patient's spouse. Patient with with history of subdural hematoma after multiple falls beginning in March 2017 as well as left radial head fracture. Family notes patient with persistent headaches. Patient with recent simple partial seizure and presented to the emergency department 02/06/2016. Cranial CT scan revealed enlarging chronic subdural hematoma. Underwent left craniotomy for evacuation of subdural hematoma 02/06/2016 per Dr. Cyndy Freeze. Maintained on Keppra for suspect seizure. Tolerating a regular consistency diet. Repeat head CT on 3/26 suggesting increased pain midline shift. PhysicalAnd occupational therapy evaluations completed and ongoing. M.D. has requested physical medicine rehabilitation consult.Patient was admitted for comprehensive rehabilitation program 02/12/2016. Patient was slow progress one admission to rehabilitation services. Persistent neurogenic headaches with trials of Topamax discontinued due to  altered mental status and appeared to be more arousable after medication adjustments. Patient remained on Keppra as advise. A cranial CT scan completed 02/14/2016 due to some increasing right-sided weakness and altered mental status showed evolutionary changes within the subdural hematoma decreasing pneumocephalus and stable since prior tracings of monitoring of left to right midline shift. Findings mild hypokalemia 3.2 supplement added. A urine study showed no growth. Appetite somewhat diminishing was maintained on gentle IV fluids for hydration. Cranial CT scan again completed 02/17/2016 due  to waxing and waning of mental status again unchanged size of residual mixed densities left subdural hematoma 6 mm rightward midline shift again unchanged. Neurosurgery contacted and felt with decline neurologically and left frontal subdural fluid collection still causing mass effect that patient should return back to the OR. She was discharged to acute care services 02/17/2016 and underwent re-entry left craniotomy for evacuation of subdural hematoma per Dr. Cyndy Freeze. Postoperatively continued to show neurological decline becoming less verbal no longer moving her right upper extremity. Follow-up CT demonstrated reaccumulation of left convex to the subdural hematoma with significant mass effect. She again returned to the OR for evacuation of residual chronic subdural hematoma. Repeat EEG 02/21/2016 suggestive of possible epileptogenic potential in the left frontoparietal region with superimposed mild encephalopathy. Her Keppra was adjusted to 1250 mg every 12 hours per neurology services. Diet slowly advanced to a dysphagia #1 thin liquid diet with nasogastric tube remaining in place for nutritional support. Follow-up urine study 02/24/2016  positive nitrite and large leukocytes cultures and sensitivities pending placed on empiric Rocephin. Physical therapy resumed with recommendations to resume inpatient rehabilitation services. Patient was admitted for comprehensive rehabilitation program to resume comprehensive therapies.  Review of Systems  Unable to perform ROS: patient nonverbal      Past Medical History  Diagnosis Date  . Diabetes mellitus   . Hypertension   . Hypercholesterolemia   . Gallstones   . Glaucoma   . Cataracts, bilateral   . COPD (chronic obstructive pulmonary disease) ([place])   . Hypothyroid   . PONV (postoperative nausea and vomiting)   . Asthma   . Chronic kidney disease 2007    kidney cancer and partial nephrctomy-right  . Subdural bleeding [facility])    Past Surgical History    Procedure Laterality Date  . Dilation and curettage of uterus    .  Partial nephrectomy      cancer  . Breast biopsy    . D&c for enlarged uterine  210  . Total abdominal hysterectomy w/ bilateral salpingoophorectomy  2011  . Cateract extractions and iol    . Cholecystectomy  2008  . Abdominal hysterectomy    . Eye surgery  08/2011,09/2012    bilateral cataracts  . Hernia repair  01/19/12    inguinal  . Inguinal hernia repair  01/19/2012    Procedure: HERNIA REPAIR INGUINAL ADULT;  Surgeon: Earnstine Regal, MD;  Location: [facility];  Service: General;  Laterality: Left;  repair left inguinal hernia with mesh   . Craniotomy Left 02/06/2016    Procedure: CRANIOTOMY HEMATOMA EVACUATION SUBDURAL;  Surgeon: Kevan Ny Ditty, MD;  Location: [facility];  Service: Neurosurgery;  Laterality: Left;  . Craniotomy N/A 02/17/2016    Procedure: CRANIOTOMY HEMATOMA EVACUATION SUBDURAL;  Surgeon: Kevan Ny Ditty, MD;  Location: [place] NEURO ORS;  Service: Neurosurgery;  Laterality: N/A;  left  . Craniotomy Left 02/18/2016    Procedure: CRANIOTOMY HEMATOMA EVACUATION SUBDURAL;  Surgeon: Earnie Larsson, MD;  Location: [place] NEURO ORS;  Service: Neurosurgery;  Laterality: Left;   Family History  Problem Relation Age of Onset  . Cancer     Social History:  reports that she quit smoking about 34 years ago. Her smoking use included Cigarettes. She has a 5 pack-year smoking history. She has never used smokeless tobacco. She reports that she does not drink alcohol or use illicit drugs. Allergies:  Allergies  Allergen Reactions  . Darvocet [Propoxyphene N-Acetaminophen] Nausea And Vomiting  . Metformin And Related Diarrhea  . Percocet [Oxycodone-Acetaminophen] Nausea And Vomiting  . Vicodin [Hydrocodone-Acetaminophen] Itching   Medications Prior to Admission  Medication Sig Dispense Refill  . Fluticasone-Salmeterol (ADVAIR) 250-50 MCG/DOSE AEPB Inhale 1 puff into the lungs 2 (two) times daily as needed (for  wheezing/shortness of breath).     [name] glimepiride (AMARYL) 2 MG tablet Take 4 mg by mouth at bedtime.     [name] latanoprost (XALATAN) 0.005 % ophthalmic solution Place 1 drop into both eyes at bedtime.     [name] lisinopril (PRINIVIL,ZESTRIL) 20 MG tablet Take 20 mg by mouth at bedtime.     . pirbuterol (MAXAIR) 200 MCG/INH inhaler Inhale 2 puffs into the lungs 4 (four) times daily as needed for wheezing or shortness of breath.     . polyethylene glycol (MIRALAX / GLYCOLAX) packet Take 17 g by mouth daily as needed for mild constipation.     . theophylline (UNIPHYL) 400 MG 24 hr tablet Take 400 mg by mouth at bedtime.     [name] thyroid (ARMOUR) 60 MG tablet Take 60 mg by mouth at bedtime.     . verapamil (CALAN-SR) 180 MG CR tablet Take 180 mg by mouth at bedtime.    . [DISCONTINUED] levETIRAcetam (KEPPRA) 1000 MG tablet Take 1 tablet (1,000 mg total) by mouth 2 (two) times daily. 60 tablet 5    Home: Home Living Family/patient expects to be discharged to:: Inpatient rehab Living Arrangements: Spouse/significant other, Children Available Help at Discharge: Family, Available 24 hours/day Type of Home: House Home Access: Stairs to enter [employer] of Steps: 2 Entrance Stairs-Rails: Left, Right, Can reach both Home Layout: One level Bathroom Shower/Tub: Tub/shower unit, Multimedia programmer: Handicapped height Bathroom Accessibility: Yes Additional Comments: daughter lives w/ pt and pt's husband.  Pt's husband mainly sits or stays in bed and is not very active, requiring assist  from daughter for ADLs and ambulation.  Lives With: Daughter, Spouse   Functional History: Prior Function Level of Independence: Needs assistance Gait / Transfers Assistance Needed: Pt was ambulating short distances with Min A using RW on CIR.  Prior to admission 3/22, pt was mod I ADL's / Homemaking Assistance Needed: Pt required min - mod A on CIR.  Prior to initial fall was very independent    Communication / Swallowing Assistance Needed: `  Functional Status:  Mobility: Bed Mobility Overal bed mobility: Needs Assistance Bed Mobility: Supine to Sit Sidelying to sit: Mod assist Supine to sit: Min assist, HOB elevated Sit to supine: Max assist, +2 for physical assistance General bed mobility comments: Increased time w/ use of bed rail and HOB elevated.  Min assist to scoot to EOB w/ use of bed pad. Transfers Overall transfer level: Needs assistance Equipment used: Rolling walker (2 wheeled) Transfer via Lift Equipment: Stedy Transfers: Sit to/from Stand, Risk manager Sit to Stand: +2 physical assistance, Mod assist, From elevated surface Stand pivot transfers: Min assist, +2 safety/equipment General transfer comment: Cues for hand placement and safe technique using RW.  Pt slow to rise and requires assist to boost w/ verbal cues to stand upright. Additional assist managing RW w/ pivot and positional cues provided to back up to chair before sitting.  Ambulation/Gait General Gait Details:  After pivoting pt declined ambulating, letting out deep sigh when asked.    ADL: ADL Overall ADL's : Needs assistance/impaired Eating/Feeding: NPO Grooming: Wash/dry hands, Wash/dry face, Oral care, Brushing hair, Total assistance, Sitting Upper Body Bathing: Total assistance, Sitting Lower Body Bathing: Total assistance, Bed level Upper Body Dressing : Total assistance, Sitting Lower Body Dressing: Total assistance, Bed level Toilet Transfer: Moderate assistance, +2 for physical assistance, Stand-pivot, BSC Toilet Transfer Details (indicate cue type and reason): unable  Toileting- Clothing Manipulation and Hygiene: Total assistance, Bed level Functional mobility during ADLs: Maximal assistance, +2 for physical assistance (to EOB ) General ADL Comments: Pt with very delayed responses, and decreased initiation   Cognition: Cognition Overall Cognitive Status:  Impaired/Different from baseline Arousal/Alertness: Awake/alert Orientation Level: Other (comment) (UTA) Attention: Focused, Sustained Focused Attention: Appears intact Sustained Attention: Impaired Sustained Attention Impairment: Verbal basic, Functional basic Memory:  (will assess in diagnostic treatment) Awareness: Impaired Awareness Impairment: Emergent impairment Behaviors: Restless, Impulsive Safety/Judgment: Impaired Cognition Arousal/Alertness: Awake/alert Behavior During Therapy: Flat affect Overall Cognitive Status: Impaired/Different from baseline Area of Impairment: Following commands, Attention Current Attention Level: Selective Following Commands: Follows one step commands with increased time Problem Solving: Slow processing, Decreased initiation General Comments: Pt attempted to verbalize today and is inconsistently successful reporting her name and the color of therapists scrubs. Difficult to assess due to: Impaired communication  Physical Exam: Blood pressure 173/74, pulse 65, temperature 98.9 F (37.2 C), temperature source Oral, resp. rate 23, weight 91.3 kg (201 lb 4.5 oz), last menstrual period 03/24/2010, SpO2 96 %. Physical Exam  Vitals reviewed. Constitutional: She appears well-developed and well-nourished.  HENT:  Craniotomy site clean and dry. Nasogastric tube in place  Eyes: Conjunctivae are normal.  Pupils reactive to light  Neck: Normal range of motion. Neck supple. No thyromegaly present.  Cardiovascular: Normal rate and regular rhythm.   Respiratory: Effort normal and breath sounds normal. No respiratory distress.  GI: Soft. Bowel sounds are normal. She exhibits no distension.  Musculoskeletal: She exhibits no edema or tenderness.  Neurological: She is alert.  Patient nonverbal DTRs symmetric Unable to assess MMT in sensation  due to lack of patient participation.  Skin: Skin is warm and dry.  Psychiatric:  Unable to assess    Results for  orders placed or performed during the hospital encounter of 02/17/16 (from the past 48 hour(s))  Glucose, capillary     Status: Abnormal   Collection Time: 02/23/16  1:15 PM  Result Value Ref Range   Glucose-Capillary 183 (H) 65 - 99 mg/dL  Glucose, capillary     Status: Abnormal   Collection Time: 02/23/16  5:15 PM  Result Value Ref Range   Glucose-Capillary 122 (H) 65 - 99 mg/dL  Glucose, capillary     Status: Abnormal   Collection Time: 02/23/16  7:09 PM  Result Value Ref Range   Glucose-Capillary 103 (H) 65 - 99 mg/dL  Glucose, capillary     Status: Abnormal   Collection Time: 02/23/16 11:02 PM  Result Value Ref Range   Glucose-Capillary 149 (H) 65 - 99 mg/dL  Glucose, capillary     Status: Abnormal   Collection Time: 02/24/16  4:19 AM  Result Value Ref Range   Glucose-Capillary 110 (H) 65 - 99 mg/dL  Glucose, capillary     Status: Abnormal   Collection Time: 02/24/16  9:07 AM  Result Value Ref Range   Glucose-Capillary 190 (H) 65 - 99 mg/dL   Comment 1 Notify RN    Comment 2 Document in Chart   Glucose, capillary     Status: Abnormal   Collection Time: 02/24/16 11:26 AM  Result Value Ref Range   Glucose-Capillary 190 (H) 65 - 99 mg/dL   Comment 1 Notify RN    Comment 2 Document in Chart   Glucose, capillary     Status: Abnormal   Collection Time: 02/24/16  3:10 PM  Result Value Ref Range   Glucose-Capillary 127 (H) 65 - 99 mg/dL   Comment 1 Notify RN    Comment 2 Document in Chart   Urinalysis, Routine w reflex microscopic (not at [facility])     Status: Abnormal   Collection Time: 02/24/16  5:15 PM  Result Value Ref Range   Color, Urine YELLOW YELLOW   APPearance CLOUDY (A) CLEAR   Specific Gravity, Urine 1.014 1.005 - 1.030   pH 7.0 5.0 - 8.0   Glucose, UA NEGATIVE NEGATIVE mg/dL   Hgb urine dipstick NEGATIVE NEGATIVE   Bilirubin Urine NEGATIVE NEGATIVE   Ketones, ur NEGATIVE NEGATIVE mg/dL   Protein, ur NEGATIVE NEGATIVE mg/dL   Nitrite POSITIVE (A) NEGATIVE    Leukocytes, UA LARGE (A) NEGATIVE  Urine microscopic-add on     Status: Abnormal   Collection Time: 02/24/16  5:15 PM  Result Value Ref Range   Squamous Epithelial / LPF 0-5 (A) NONE SEEN   WBC, UA TOO NUMEROUS TO COUNT 0 - 5 WBC/hpf   RBC / HPF NONE SEEN 0 - 5 RBC/hpf   Bacteria, UA MANY (A) NONE SEEN  Glucose, capillary     Status: Abnormal   Collection Time: 02/24/16  7:13 PM  Result Value Ref Range   Glucose-Capillary 148 (H) 65 - 99 mg/dL  Glucose, capillary     Status: Abnormal   Collection Time: 02/24/16 11:50 PM  Result Value Ref Range   Glucose-Capillary 136 (H) 65 - 99 mg/dL  Glucose, capillary     Status: Abnormal   Collection Time: 02/25/16  4:29 AM  Result Value Ref Range   Glucose-Capillary 179 (H) 65 - 99 mg/dL  Glucose, capillary     Status: Abnormal  Collection Time: 02/25/16  9:44 AM  Result Value Ref Range   Glucose-Capillary 149 (H) 65 - 99 mg/dL   Comment 1 Notify RN    Comment 2 Document in Chart    Dg Swallowing Func-speech Pathology  02/23/2016  Objective Swallowing Evaluation: Type of Study: MBS-Modified Barium Swallow Study Patient Details Name: FALON KENDAL MRN: [id number] Date of Birth: [birth year] Today's Date: 02/23/2016 Time: SLP Start Time (ACUTE ONLY): 1201-SLP Stop Time (ACUTE ONLY): 1214 SLP Time Calculation (min) (ACUTE ONLY): 13 min Past Medical History: Past Medical History Diagnosis Date . Diabetes mellitus  . Hypertension  . Hypercholesterolemia  . Gallstones  . Glaucoma  . Cataracts, bilateral  . COPD (chronic obstructive pulmonary disease) ([place])  . Hypothyroid  . PONV (postoperative nausea and vomiting)  . Asthma  . Chronic kidney disease 2007   kidney cancer and partial nephrctomy-right . Subdural bleeding [facility])  Past Surgical History: Past Surgical History Procedure Laterality Date . Dilation and curettage of uterus   . Partial nephrectomy     cancer . Breast biopsy   . D&c for enlarged uterine  210 . Total abdominal hysterectomy w/ bilateral  salpingoophorectomy  2011 . Cateract extractions and iol   . Cholecystectomy  2008 . Abdominal hysterectomy   . Eye surgery  08/2011,09/2012   bilateral cataracts . Hernia repair  01/19/12   inguinal . Inguinal hernia repair  01/19/2012   Procedure: HERNIA REPAIR INGUINAL ADULT;  Surgeon: Earnstine Regal, MD;  Location: [facility];  Service: General;  Laterality: Left;  repair left inguinal hernia with mesh  . Craniotomy Left 02/06/2016   Procedure: CRANIOTOMY HEMATOMA EVACUATION SUBDURAL;  Surgeon: Kevan Ny Ditty, MD;  Location: [facility];  Service: Neurosurgery;  Laterality: Left; . Craniotomy N/A 02/17/2016   Procedure: CRANIOTOMY HEMATOMA EVACUATION SUBDURAL;  Surgeon: Kevan Ny Ditty, MD;  Location: [place] NEURO ORS;  Service: Neurosurgery;  Laterality: N/A;  left . Craniotomy Left 02/18/2016   Procedure: CRANIOTOMY HEMATOMA EVACUATION SUBDURAL;  Surgeon: Earnie Larsson, MD;  Location: [place] NEURO ORS;  Service: Neurosurgery;  Laterality: Left; HPI: 82 y.o. female with h/o DM, HTN, COPD, asthma and chronic kidney disease, who underwent craniotomy x2 for evacuation of chronic SDH parieto-occipital subdural hematoma. Pt was previously admitted 3/6 after fall and was d/c home next day. Pt was readmitted 3/22 due to headaches and neuro changes, then d/c to CIR 3/29. Pt currently admitted from CIR due to neurological decline. Pt has been receiving SLP intervention while in house, focusing on comprehension, expression, social interaction, problem solving and memory. CT Head 4/4 L frontal lobe 2-3 cm area of cytotoxic edema compatible with small acute/evolving L MCA territory infarct. CXR 4/1 no active cardiopulmonary disease. Subjective: pt alert, aphasic Assessment / Plan / Recommendation CHL IP CLINICAL IMPRESSIONS 02/23/2016 Therapy Diagnosis Moderate oral phase dysphagia;Mild pharyngeal phase dysphagia Clinical Impression Pt has a moderate oral and mild pharyngeal dysphagia with no aspiration observed across challenging. She  has piecemeal swallowing and slow oral transit with all consistencies tested, as well as significantly prolonged mastication with soft solids. Once boluses reach her pharynx, she is able to trigger a reflexive swallow with good timing and adequate pharyngeal strength, with only mild residue remaining in the valleculae with purees and soft solids. Of note, pt had intermittent throat clearing throughout the study that was not associated with any airway penetration. Recommend Dys 1 diet for ease of oral preparation and thin liquids, meds crushed in puree. Will continue to follow for tolerance  and advancement. Impact on safety and function Mild aspiration risk   CHL IP TREATMENT RECOMMENDATION 02/23/2016 Treatment Recommendations Therapy as outlined in treatment plan below   Prognosis 02/23/2016 Prognosis for Safe Diet Advancement Good Barriers to Reach Goals -- Barriers/Prognosis Comment -- CHL IP DIET RECOMMENDATION 02/23/2016 SLP Diet Recommendations Dysphagia 1 (Puree) solids;Thin liquid Liquid Administration via Cup;Straw Medication Administration Crushed with puree Compensations Minimize environmental distractions;Slow rate;Small sips/bites;Follow solids with liquid Postural Changes Remain semi-upright after after feeds/meals (Comment);Seated upright at 90 degrees   CHL IP OTHER RECOMMENDATIONS 02/23/2016 Recommended Consults -- Oral Care Recommendations Oral care BID Other Recommendations --   CHL IP FOLLOW UP RECOMMENDATIONS 02/23/2016 Follow up Recommendations Inpatient Rehab   CHL IP FREQUENCY AND DURATION 02/23/2016 Speech Therapy Frequency (ACUTE ONLY) min 2x/week Treatment Duration 2 weeks      CHL IP ORAL PHASE 02/23/2016 Oral Phase Impaired Oral - Pudding Teaspoon -- Oral - Pudding Cup -- Oral - Honey Teaspoon -- Oral - Honey Cup -- Oral - Nectar Teaspoon Delayed oral transit;Piecemeal swallowing Oral - Nectar Cup Delayed oral transit;Piecemeal swallowing Oral - Nectar Straw -- Oral - Thin Teaspoon -- Oral - Thin Cup  Delayed oral transit;Piecemeal swallowing Oral - Thin Straw Delayed oral transit;Piecemeal swallowing Oral - Puree Delayed oral transit;Piecemeal swallowing Oral - Mech Soft Delayed oral transit;Piecemeal swallowing;Impaired mastication Oral - Regular -- Oral - Multi-Consistency -- Oral - Pill -- Oral Phase - Comment --  CHL IP PHARYNGEAL PHASE 02/23/2016 Pharyngeal Phase Impaired Pharyngeal- Pudding Teaspoon -- Pharyngeal -- Pharyngeal- Pudding Cup -- Pharyngeal -- Pharyngeal- Honey Teaspoon -- Pharyngeal -- Pharyngeal- Honey Cup -- Pharyngeal -- Pharyngeal- Nectar Teaspoon Reduced epiglottic inversion;Pharyngeal residue - valleculae;Penetration/Aspiration during swallow;Reduced tongue base retraction Pharyngeal Material enters airway, remains ABOVE vocal cords then ejected out Pharyngeal- Nectar Cup Reduced tongue base retraction Pharyngeal -- Pharyngeal- Nectar Straw -- Pharyngeal -- Pharyngeal- Thin Teaspoon -- Pharyngeal -- Pharyngeal- Thin Cup Reduced tongue base retraction Pharyngeal -- Pharyngeal- Thin Straw Reduced tongue base retraction Pharyngeal -- Pharyngeal- Puree Reduced tongue base retraction;Pharyngeal residue - valleculae Pharyngeal -- Pharyngeal- Mechanical Soft Reduced tongue base retraction;Pharyngeal residue - valleculae Pharyngeal -- Pharyngeal- Regular -- Pharyngeal -- Pharyngeal- Multi-consistency -- Pharyngeal -- Pharyngeal- Pill -- Pharyngeal -- Pharyngeal Comment --  CHL IP CERVICAL ESOPHAGEAL PHASE 02/23/2016 Cervical Esophageal Phase WFL Pudding Teaspoon -- Pudding Cup -- Honey Teaspoon -- Honey Cup -- Nectar Teaspoon -- Nectar Cup -- Nectar Straw -- Thin Teaspoon -- Thin Cup -- Thin Straw -- Puree -- Mechanical Soft -- Regular -- Multi-consistency -- Pill -- Cervical Esophageal Comment -- No flowsheet data found. Germain Osgood, M.A. CCC-SLP [phone] Germain Osgood 02/23/2016, 1:28 PM               Medical Problem List and Plan: 1.  Right hemiparesthesias/aphasia/dysphasia  secondary to traumatic subdural hematoma status post left craniotomy evacuated subdural hematoma with reaccumulation of fluid craniotomy 2 2.  DVT Prophylaxis/Anticoagulation: SCDs. Monitor for any signs of DVT 3. Pain Management: Tylenol as needed 4. Dysphagia. Dysphagia #1 thin liquids. Nasogastric tube remains in place. Monitor for any signs of aspiration. Follow-up speech therapy 5. Neuropsych: This patient is not capable of making decisions on her own behalf. 6. Skin/Wound Care: Routine skin checks 7. Fluids/Electrolytes/Nutrition: Routine I&O with follow-up chemistries 8. Seizure disorder. Keppra 1250 mg every 12 hours. Monitor for any seizure activity 9. Hypertension. Lisinopril 20 mg daily at bedtime, verapamil 40 mg 3 times a day,Lopressor 25 mg BID. Monitor with increased mobility 10. Diabetes mellitus.  Hemoglobin A1c 6.7. Amaryl 4 mg daily at bedtime. Check blood sugars before meals and at bedtime 11. COPD. Continue Dulera and nebulizers as directed 12. UTI. Continue Rocephin for now. Await cultures and sensitivities 13. Hypothyroidism. Thyroid 60 mg daily at bedtime. Latest TSH 0.285  Post Admission Physician Evaluation: Functional deficits secondary  to traumatic subdural hematoma status post left craniotomy evacuated subdural hematoma with reaccumulation of fluid craniotomy 2. 1. Patient is admitted to receive collaborative, interdisciplinary care between the physiatrist, rehab nursing staff, and therapy team. 2. Patient's level of medical complexity and substantial therapy needs in context of that medical necessity cannot be provided at a lesser intensity of care such as a SNF. 3. Patient has experienced substantial functional loss from his/her baseline which was documented above under the "Functional History" and "Functional Status" headings.  Judging by the patient's diagnosis, physical exam, and functional history, the patient has potential for functional progress which will  result in measurable gains while on inpatient rehab.  These gains will be of substantial and practical use upon discharge  in facilitating mobility and self-care at the household level. 4. Physiatrist will provide 24 hour management of medical needs as well as oversight of the therapy plan/treatment and provide guidance as appropriate regarding the interaction of the two. 5. 24 hour rehab nursing will assist with bladder management, bowel management, safety, skin/wound care, disease management, medication administration and patient education  and help integrate therapy concepts, techniques,education, etc. 6. PT will assess and treat for/with: Lower extremity strength, range of motion, stamina, balance, functional mobility, safety, adaptive techniques and equipment, woundcare, coping skills, pain control, education.   Goals are: Min assist/supervision. 7. OT will assess and treat for/with: ADL's, functional mobility, safety, upper extremity strength, adaptive techniques and equipment, wound mgt, ego support, and community reintegration.   Goals are: Min assist/supervision. Therapy may not proceed with showering this patient. 8. SLP will assess and treat for/with: Speech, language, cognition.  Goals are: Mod/min assist. 9. Case Management and Social Worker will assess and treat for psychological issues and discharge planning. 10. Team conference will be held weekly to assess progress toward goals and to determine barriers to discharge. 11. Patient will receive at least 3 hours of therapy per day at least 5 days per week. 12. ELOS: 19-22 days.       13. Prognosis:  good and fair  Delice Lesch, MD 02/25/2016

## 2016-02-25 NOTE — Progress Notes (Signed)
Speech Language Pathology Treatment: Cognitive-Linquistic  Patient Details Name: Carolyn [name] MRN: [id number] DOB: November 19, 1932 Today's Date: 02/25/2016 Time: [id number] SLP Time Calculation (min) (ACUTE ONLY): 16 min  Assessment / Plan / Recommendation Clinical Impression  Pt politely declined PO trials this morning, therefore treatment focused on cognitive-linguistic goals. She required Max cues to attempt counting task today, suspect related to fatigue. SLP introduced communication board with a field of 4 pictures, which pt pointed to with 100% accuracy with extra processing time. Board was left with pt and family, and RN educated about its use. Will continue to follow. She remains a strong candidate for CIR.   HPI HPI: 82 y.o. female with h/o DM, HTN, COPD, asthma and chronic kidney disease, who underwent craniotomy x2 for evacuation of chronic SDH parieto-occipital subdural hematoma. Pt was previously admitted 3/6 after fall and was d/c home next day. Pt was readmitted 3/22 due to headaches and neuro changes, then d/c to CIR 3/29. Pt currently admitted from CIR due to neurological decline. Pt has been receiving SLP intervention while in house, focusing on comprehension, expression, social interaction, problem solving and memory. CT Head 4/4 L frontal lobe 2-3 cm area of cytotoxic edema compatible with small acute/evolving L MCA territory infarct. CXR 4/1 no active cardiopulmonary disease.      SLP Plan  Continue with current plan of care     Recommendations  Diet recommendations: Dysphagia 1 (puree);Thin liquid Liquids provided via: Cup;Straw Medication Administration: Crushed with puree Supervision: Patient able to self feed;Staff to assist with self feeding;Full supervision/cueing for compensatory strategies Compensations: Minimize environmental distractions;Slow rate;Small sips/bites;Follow solids with liquid Postural Changes and/or Swallow Maneuvers: Seated upright 90 degrees              Oral Care Recommendations: Oral care BID Follow up Recommendations: Inpatient Rehab Plan: Continue with current plan of care     GO               Germain Osgood, M.A. CCC-SLP [phone]  Germain Osgood 02/25/2016, 11:52 AM

## 2016-02-25 NOTE — Progress Notes (Signed)
Doing well Started on rocephin yesterday for UTI AVSS Awake and alert Dyspashia improving Moves all extremities well Wound looks good Improving Hopefully to rehab today

## 2016-02-26 ENCOUNTER — Inpatient Hospital Stay (HOSPITAL_COMMUNITY)
Admission: RE | Admit: 2016-02-26 | Discharge: 2016-03-06 | DRG: 949 | Disposition: A | Discharge status: Home Health | Payer: PPO | Source: Intra-hospital | Attending: Physical Medicine & Rehabilitation | Admitting: Physical Medicine & Rehabilitation

## 2016-02-26 DIAGNOSIS — N390 Urinary tract infection, site not specified: Secondary | ICD-10-CM

## 2016-02-26 DIAGNOSIS — G8191 Hemiplegia, unspecified affecting right dominant side: Secondary | ICD-10-CM

## 2016-02-26 DIAGNOSIS — I6200 Nontraumatic subdural hemorrhage, unspecified: Secondary | ICD-10-CM

## 2016-02-26 DIAGNOSIS — S0990XS Unspecified injury of head, sequela: Secondary | ICD-10-CM | POA: Diagnosis not present

## 2016-02-26 DIAGNOSIS — R1311 Dysphagia, oral phase: Secondary | ICD-10-CM | POA: Diagnosis not present

## 2016-02-26 DIAGNOSIS — Z85528 Personal history of other malignant neoplasm of kidney: Secondary | ICD-10-CM | POA: Diagnosis not present

## 2016-02-26 DIAGNOSIS — R4189 Other symptoms and signs involving cognitive functions and awareness: Secondary | ICD-10-CM

## 2016-02-26 DIAGNOSIS — N189 Chronic kidney disease, unspecified: Secondary | ICD-10-CM | POA: Diagnosis not present

## 2016-02-26 DIAGNOSIS — R1310 Dysphagia, unspecified: Secondary | ICD-10-CM

## 2016-02-26 DIAGNOSIS — S065X2S Traumatic subdural hemorrhage with loss of consciousness of 31 minutes to 59 minutes, sequela: Secondary | ICD-10-CM

## 2016-02-26 DIAGNOSIS — G40909 Epilepsy, unspecified, not intractable, without status epilepticus: Secondary | ICD-10-CM

## 2016-02-26 DIAGNOSIS — E1122 Type 2 diabetes mellitus with diabetic chronic kidney disease: Secondary | ICD-10-CM

## 2016-02-26 DIAGNOSIS — S065X3S Traumatic subdural hemorrhage with loss of consciousness of 1 hour to 5 hours 59 minutes, sequela: Secondary | ICD-10-CM | POA: Diagnosis not present

## 2016-02-26 DIAGNOSIS — J449 Chronic obstructive pulmonary disease, unspecified: Secondary | ICD-10-CM

## 2016-02-26 DIAGNOSIS — E039 Hypothyroidism, unspecified: Secondary | ICD-10-CM | POA: Diagnosis not present

## 2016-02-26 DIAGNOSIS — E1142 Type 2 diabetes mellitus with diabetic polyneuropathy: Secondary | ICD-10-CM

## 2016-02-26 DIAGNOSIS — S069X9S Unspecified intracranial injury with loss of consciousness of unspecified duration, sequela: Secondary | ICD-10-CM

## 2016-02-26 DIAGNOSIS — F09 Unspecified mental disorder due to known physiological condition: Secondary | ICD-10-CM | POA: Diagnosis not present

## 2016-02-26 DIAGNOSIS — B3749 Other urogenital candidiasis: Secondary | ICD-10-CM

## 2016-02-26 DIAGNOSIS — H409 Unspecified glaucoma: Secondary | ICD-10-CM | POA: Diagnosis not present

## 2016-02-26 DIAGNOSIS — I129 Hypertensive chronic kidney disease with stage 1 through stage 4 chronic kidney disease, or unspecified chronic kidney disease: Secondary | ICD-10-CM

## 2016-02-26 DIAGNOSIS — R1313 Dysphagia, pharyngeal phase: Secondary | ICD-10-CM | POA: Diagnosis not present

## 2016-02-26 DIAGNOSIS — R4702 Dysphasia: Secondary | ICD-10-CM | POA: Diagnosis not present

## 2016-02-26 DIAGNOSIS — F0281 Dementia in other diseases classified elsewhere with behavioral disturbance: Secondary | ICD-10-CM

## 2016-02-26 DIAGNOSIS — W19XXXD Unspecified fall, subsequent encounter: Secondary | ICD-10-CM

## 2016-02-26 DIAGNOSIS — R569 Unspecified convulsions: Secondary | ICD-10-CM | POA: Diagnosis not present

## 2016-02-26 DIAGNOSIS — S065X0D Traumatic subdural hemorrhage without loss of consciousness, subsequent encounter: Secondary | ICD-10-CM | POA: Diagnosis not present

## 2016-02-26 DIAGNOSIS — S065XAA Traumatic subdural hemorrhage with loss of consciousness status unknown, initial encounter: Secondary | ICD-10-CM | POA: Diagnosis present

## 2016-02-26 DIAGNOSIS — Z87891 Personal history of nicotine dependence: Secondary | ICD-10-CM | POA: Diagnosis not present

## 2016-02-26 DIAGNOSIS — Z7984 Long term (current) use of oral hypoglycemic drugs: Secondary | ICD-10-CM

## 2016-02-26 DIAGNOSIS — R4701 Aphasia: Secondary | ICD-10-CM | POA: Diagnosis not present

## 2016-02-26 DIAGNOSIS — Z79899 Other long term (current) drug therapy: Secondary | ICD-10-CM | POA: Diagnosis not present

## 2016-02-26 DIAGNOSIS — I10 Essential (primary) hypertension: Secondary | ICD-10-CM

## 2016-02-26 DIAGNOSIS — S065X9A Traumatic subdural hemorrhage with loss of consciousness of unspecified duration, initial encounter: Secondary | ICD-10-CM | POA: Diagnosis present

## 2016-02-26 LAB — COMPREHENSIVE METABOLIC PANEL
ALT: 17 U/L (ref 14–54)
AST: 21 U/L (ref 15–41)
Albumin: 2.8 g/dL — ABNORMAL LOW (ref 3.5–5.0)
Alkaline Phosphatase: 62 U/L (ref 38–126)
Anion gap: 9 (ref 5–15)
BUN: 10 mg/dL (ref 6–20)
CO2: 29 mmol/L (ref 22–32)
Calcium: 9.5 mg/dL (ref 8.9–10.3)
Chloride: 102 mmol/L (ref 101–111)
Creatinine, Ser: 0.53 mg/dL (ref 0.44–1.00)
GFR calc Af Amer: >60 mL/min (ref >60)
GFR calc non Af Amer: >60 mL/min (ref >60)
Glucose, Bld: 84 mg/dL (ref 65–99)
Potassium: 4.1 mmol/L (ref 3.5–5.1)
Sodium: 140 mmol/L (ref 135–145)
Total Bilirubin: 0.7 mg/dL (ref 0.3–1.2)
Total Protein: 5.8 g/dL — ABNORMAL LOW (ref 6.5–8.1)

## 2016-02-26 LAB — CBC WITH DIFFERENTIAL/PLATELET
Basophils Absolute: 0 10*3/uL (ref 0.0–0.1)
Basophils Relative: 0 %
Eosinophils Absolute: 0.9 10*3/uL — ABNORMAL HIGH (ref 0.0–0.7)
Eosinophils Relative: 10 %
HCT: 32.7 % — ABNORMAL LOW (ref 36.0–46.0)
Hemoglobin: 10.5 g/dL — ABNORMAL LOW (ref 12.0–15.0)
Lymphocytes Relative: 39 %
Lymphs Abs: 3.3 10*3/uL (ref 0.7–4.0)
MCH: 28.2 pg (ref 26.0–34.0)
MCHC: 32.1 g/dL (ref 30.0–36.0)
MCV: 87.7 fL (ref 78.0–100.0)
Monocytes Absolute: 0.8 10*3/uL (ref 0.1–1.0)
Monocytes Relative: 10 %
Neutro Abs: 3.5 10*3/uL (ref 1.7–7.7)
Neutrophils Relative %: 41 %
Platelets: 218 10*3/uL (ref 150–400)
RBC: 3.73 MIL/uL — ABNORMAL LOW (ref 3.87–5.11)
RDW: 14.7 % (ref 11.5–15.5)
WBC: 8.5 10*3/uL (ref 4.0–10.5)

## 2016-02-26 LAB — GLUCOSE, CAPILLARY
Glucose-Capillary: 147 mg/dL — ABNORMAL HIGH (ref 65–99)
Glucose-Capillary: 189 mg/dL — ABNORMAL HIGH (ref 65–99)
Glucose-Capillary: 160 mg/dL — ABNORMAL HIGH (ref 65–99)
Glucose-Capillary: 84 mg/dL (ref 65–99)
Glucose-Capillary: 67 mg/dL (ref 65–99)
Glucose-Capillary: 110 mg/dL — ABNORMAL HIGH (ref 65–99)
Glucose-Capillary: 182 mg/dL — ABNORMAL HIGH (ref 65–99)

## 2016-02-26 MED ORDER — ONDANSETRON HCL 4 MG/2ML IJ SOLN: 4.0000 mg | Freq: Four times a day (QID) | INTRAMUSCULAR | Status: DC | PRN

## 2016-02-26 MED ORDER — ALBUTEROL SULFATE (2.5 MG/3ML) 0.083% IN NEBU: 2.5000 mg | INHALATION_SOLUTION | RESPIRATORY_TRACT | Status: DC | PRN

## 2016-02-26 MED ORDER — LEVETIRACETAM 100 MG/ML PO SOLN
1250.0000 mg | Freq: Two times a day (BID) | ORAL | Status: DC
  Administered 2016-02-27 – 2016-03-06 (×17): 1250 mg
  Filled 2016-02-26 (×17): qty 15

## 2016-02-26 MED ORDER — JEVITY 1.2 CAL PO LIQD
1000.0000 mL | ORAL | Status: DC
  Administered 2016-02-26: 1000 mL
  Filled 2016-02-26 (×4): qty 1000

## 2016-02-26 MED ORDER — BISACODYL 5 MG PO TBEC: 5.0000 mg | DELAYED_RELEASE_TABLET | Freq: Every day | ORAL | Status: DC | PRN

## 2016-02-26 MED ORDER — SORBITOL 70 % SOLN: 30.0000 mL | Freq: Every day | Status: DC | PRN

## 2016-02-26 MED ORDER — LATANOPROST 0.005 % OP SOLN
1.0000 [drp] | Freq: Every day | OPHTHALMIC | Status: DC
  Administered 2016-02-26 – 2016-03-05 (×9): 1 [drp] via OPHTHALMIC
  Filled 2016-02-26: qty 2.5

## 2016-02-26 MED ORDER — LISINOPRIL 20 MG PO TABS
20.0000 mg | ORAL_TABLET | Freq: Every day | ORAL | Status: DC
  Administered 2016-02-26 – 2016-03-05 (×9): 20 mg
  Filled 2016-02-26 (×9): qty 1

## 2016-02-26 MED ORDER — INSULIN ASPART 100 UNIT/ML ~~LOC~~ SOLN
0.0000 [IU] | SUBCUTANEOUS | Status: DC
  Administered 2016-02-26 – 2016-02-27 (×2): 3 [IU] via SUBCUTANEOUS
  Administered 2016-02-27: 2 [IU] via SUBCUTANEOUS
  Administered 2016-02-27 (×2): 3 [IU] via SUBCUTANEOUS
  Administered 2016-02-27 – 2016-02-28 (×2): 2 [IU] via SUBCUTANEOUS
  Administered 2016-02-28 (×2): 3 [IU] via SUBCUTANEOUS
  Administered 2016-02-28 – 2016-03-01 (×9): 2 [IU] via SUBCUTANEOUS

## 2016-02-26 MED ORDER — METOPROLOL TARTRATE 25 MG PO TABS
25.0000 mg | ORAL_TABLET | Freq: Two times a day (BID) | ORAL | Status: DC
  Administered 2016-02-26 – 2016-03-06 (×18): 25 mg
  Filled 2016-02-26 (×18): qty 1

## 2016-02-26 MED ORDER — MOMETASONE FURO-FORMOTEROL FUM 200-5 MCG/ACT IN AERO
2.0000 | INHALATION_SPRAY | Freq: Two times a day (BID) | RESPIRATORY_TRACT | Status: DC
  Administered 2016-02-27: 2 via RESPIRATORY_TRACT
  Filled 2016-02-26: qty 8.8

## 2016-02-26 MED ORDER — JEVITY 1.2 CAL PO LIQD
1000.0000 mL | ORAL | Status: DC
  Administered 2016-02-26: 1000 mL
  Filled 2016-02-26 (×3): qty 1000

## 2016-02-26 MED ORDER — DEXTROSE 5 % IV SOLN
1.0000 g | INTRAVENOUS | Status: DC
  Administered 2016-02-26 – 2016-03-01 (×5): 1 g via INTRAVENOUS
  Filled 2016-02-26 (×6): qty 10

## 2016-02-26 MED ORDER — THYROID 60 MG PO TABS
60.0000 mg | ORAL_TABLET | Freq: Every day | ORAL | Status: DC
  Administered 2016-02-26 – 2016-03-05 (×9): 60 mg via ORAL
  Filled 2016-02-26 (×9): qty 1

## 2016-02-26 MED ORDER — ENSURE ENLIVE PO LIQD
237.0000 mL | Freq: Two times a day (BID) | ORAL | Status: DC
  Administered 2016-02-27 – 2016-03-05 (×10): 237 mL via ORAL

## 2016-02-26 MED ORDER — GLIMEPIRIDE 2 MG PO TABS
4.0000 mg | ORAL_TABLET | Freq: Every day | ORAL | Status: DC
  Administered 2016-02-26 – 2016-03-05 (×9): 4 mg via ORAL
  Filled 2016-02-26 (×9): qty 2

## 2016-02-26 MED ORDER — VERAPAMIL HCL 40 MG PO TABS
40.0000 mg | ORAL_TABLET | Freq: Three times a day (TID) | ORAL | Status: DC
  Administered 2016-02-26 – 2016-03-06 (×26): 40 mg via ORAL
  Filled 2016-02-26 (×28): qty 1

## 2016-02-26 MED ORDER — PANTOPRAZOLE SODIUM 40 MG PO PACK
40.0000 mg | PACK | Freq: Every day | ORAL | Status: DC
  Administered 2016-02-26 – 2016-03-05 (×9): 40 mg
  Filled 2016-02-26 (×4): qty 20

## 2016-02-26 MED ORDER — ONDANSETRON HCL 4 MG PO TABS: 4.0000 mg | ORAL_TABLET | Freq: Four times a day (QID) | ORAL | Status: DC | PRN

## 2016-02-26 NOTE — H&P (View-Only) (Signed)
Physical Medicine and Rehabilitation Admission H&P    : HPI: Carolyn [name] is a 82 y.o. right handed female with history of hypertension, diabetes mellitus, COPD. By chart review patient lives with husband and daughter in [place]. She used a walker prior to admission. One level home with 2 steps to entry. Daughter is an Therapist, sports who is a full-time caregiver for both the patient and the patient's spouse. Patient with with history of subdural hematoma after multiple falls beginning in March 2017 as well as left radial head fracture. Family notes patient with persistent headaches. Patient with recent simple partial seizure and presented to the emergency department 02/06/2016. Cranial CT scan revealed enlarging chronic subdural hematoma. Underwent left craniotomy for evacuation of subdural hematoma 02/06/2016 per Dr. Cyndy Freeze. Maintained on Keppra for suspect seizure. Tolerating a regular consistency diet. Repeat head CT on 3/26 suggesting increased pain midline shift. PhysicalAnd occupational therapy evaluations completed and ongoing. M.D. has requested physical medicine rehabilitation consult.Patient was admitted for comprehensive rehabilitation program 02/12/2016. Patient was slow progress one admission to rehabilitation services. Persistent neurogenic headaches with trials of Topamax discontinued due to  altered mental status and appeared to be more arousable after medication adjustments. Patient remained on Keppra as advise. A cranial CT scan completed 02/14/2016 due to some increasing right-sided weakness and altered mental status showed evolutionary changes within the subdural hematoma decreasing pneumocephalus and stable since prior tracings of monitoring of left to right midline shift. Findings mild hypokalemia 3.2 supplement added. A urine study showed no growth. Appetite somewhat diminishing was maintained on gentle IV fluids for hydration. Cranial CT scan again completed 02/17/2016 due  to waxing and waning of mental status again unchanged size of residual mixed densities left subdural hematoma 6 mm rightward midline shift again unchanged. Neurosurgery contacted and felt with decline neurologically and left frontal subdural fluid collection still causing mass effect that patient should return back to the OR. She was discharged to acute care services 02/17/2016 and underwent re-entry left craniotomy for evacuation of subdural hematoma per Dr. Cyndy Freeze. Postoperatively continued to show neurological decline becoming less verbal no longer moving her right upper extremity. Follow-up CT demonstrated reaccumulation of left convex to the subdural hematoma with significant mass effect. She again returned to the OR for evacuation of residual chronic subdural hematoma. Repeat EEG 02/21/2016 suggestive of possible epileptogenic potential in the left frontoparietal region with superimposed mild encephalopathy. Her Keppra was adjusted to 1250 mg every 12 hours per neurology services. Diet slowly advanced to a dysphagia #1 thin liquid diet with nasogastric tube remaining in place for nutritional support. Follow-up urine study 02/24/2016  positive nitrite and large leukocytes cultures and sensitivities pending placed on empiric Rocephin. Physical therapy resumed with recommendations to resume inpatient rehabilitation services. Patient was admitted for comprehensive rehabilitation program to resume comprehensive therapies.  Review of Systems  Unable to perform ROS: patient nonverbal      Past Medical History  Diagnosis Date  . Diabetes mellitus   . Hypertension   . Hypercholesterolemia   . Gallstones   . Glaucoma   . Cataracts, bilateral   . COPD (chronic obstructive pulmonary disease) ([place])   . Hypothyroid   . PONV (postoperative nausea and vomiting)   . Asthma   . Chronic kidney disease 2007    kidney cancer and partial nephrctomy-right  . Subdural bleeding [facility])    Past Surgical History    Procedure Laterality Date  . Dilation and curettage of uterus    .  Partial nephrectomy      cancer  . Breast biopsy    . D&c for enlarged uterine  210  . Total abdominal hysterectomy w/ bilateral salpingoophorectomy  2011  . Cateract extractions and iol    . Cholecystectomy  2008  . Abdominal hysterectomy    . Eye surgery  08/2011,09/2012    bilateral cataracts  . Hernia repair  01/19/12    inguinal  . Inguinal hernia repair  01/19/2012    Procedure: HERNIA REPAIR INGUINAL ADULT;  Surgeon: Earnstine Regal, MD;  Location: [facility] OR[facility];  Service: General;  Laterality: Left;  repair left inguinal hernia with mesh   . Craniotomy Left 02/06/2016    Procedure: CRANIOTOMY HEMATOMA EVACUATION SUBDURAL;  Surgeon: Kevan Ny Ditty, MD;  Location: [facility];  Service: Neurosurgery;  Laterality: Left;  . Craniotomy N/A 02/17/2016    Procedure: CRANIOTOMY HEMATOMA EVACUATION SUBDURAL;  Surgeon: Kevan Ny Ditty, MD;  Location: [place] NEURO ORS;  Service: Neurosurgery;  Laterality: N/A;  left  . Craniotomy Left 02/18/2016    Procedure: CRANIOTOMY HEMATOMA EVACUATION SUBDURAL;  Surgeon: Earnie Larsson, MD;  Location: [place] NEURO ORS;  Service: Neurosurgery;  Laterality: Left;   Family History  Problem Relation Age of Onset  . Cancer     Social History:  reports that she quit smoking about 34 years ago. Her smoking use included Cigarettes. She has a 5 pack-year smoking history. She has never used smokeless tobacco. She reports that she does not drink alcohol or use illicit drugs. Allergies:  Allergies  Allergen Reactions  . Darvocet [Propoxyphene N-Acetaminophen] Nausea And Vomiting  . Metformin And Related Diarrhea  . Percocet [Oxycodone-Acetaminophen] Nausea And Vomiting  . Vicodin [Hydrocodone-Acetaminophen] Itching   Medications Prior to Admission  Medication Sig Dispense Refill  . Fluticasone-Salmeterol (ADVAIR) 250-50 MCG/DOSE AEPB Inhale 1 puff into the lungs 2 (two) times daily as needed (for  wheezing/shortness of breath).     [name] glimepiride (AMARYL) 2 MG tablet Take 4 mg by mouth at bedtime.     [name] latanoprost (XALATAN) 0.005 % ophthalmic solution Place 1 drop into both eyes at bedtime.     [name] lisinopril (PRINIVIL,ZESTRIL) 20 MG tablet Take 20 mg by mouth at bedtime.     . pirbuterol (MAXAIR) 200 MCG/INH inhaler Inhale 2 puffs into the lungs 4 (four) times daily as needed for wheezing or shortness of breath.     . polyethylene glycol (MIRALAX / GLYCOLAX) packet Take 17 g by mouth daily as needed for mild constipation.     . theophylline (UNIPHYL) 400 MG 24 hr tablet Take 400 mg by mouth at bedtime.     [name] thyroid (ARMOUR) 60 MG tablet Take 60 mg by mouth at bedtime.     . verapamil (CALAN-SR) 180 MG CR tablet Take 180 mg by mouth at bedtime.    . [DISCONTINUED] levETIRAcetam (KEPPRA) 1000 MG tablet Take 1 tablet (1,000 mg total) by mouth 2 (two) times daily. 60 tablet 5    Home: Home Living Family/patient expects to be discharged to:: Inpatient rehab Living Arrangements: Spouse/significant other, Children Available Help at Discharge: Family, Available 24 hours/day Type of Home: House Home Access: Stairs to enter [employer] of Steps: 2 Entrance Stairs-Rails: Left, Right, Can reach both Home Layout: One level Bathroom Shower/Tub: Tub/shower unit, Multimedia programmer: Handicapped height Bathroom Accessibility: Yes Additional Comments: daughter lives w/ pt and pt's husband.  Pt's husband mainly sits or stays in bed and is not very active, requiring assist  from daughter for ADLs and ambulation.  Lives With: Daughter, Spouse   Functional History: Prior Function Level of Independence: Needs assistance Gait / Transfers Assistance Needed: Pt was ambulating short distances with Min A using RW on CIR.  Prior to admission 3/22, pt was mod I ADL's / Homemaking Assistance Needed: Pt required min - mod A on CIR.  Prior to initial fall was very independent    Communication / Swallowing Assistance Needed: `  Functional Status:  Mobility: Bed Mobility Overal bed mobility: Needs Assistance Bed Mobility: Supine to Sit Sidelying to sit: Mod assist Supine to sit: Min assist, HOB elevated Sit to supine: Max assist, +2 for physical assistance General bed mobility comments: Increased time w/ use of bed rail and HOB elevated.  Min assist to scoot to EOB w/ use of bed pad. Transfers Overall transfer level: Needs assistance Equipment used: Rolling walker (2 wheeled) Transfer via Lift Equipment: Stedy Transfers: Sit to/from Stand, Risk manager Sit to Stand: +2 physical assistance, Mod assist, From elevated surface Stand pivot transfers: Min assist, +2 safety/equipment General transfer comment: Cues for hand placement and safe technique using RW.  Pt slow to rise and requires assist to boost w/ verbal cues to stand upright. Additional assist managing RW w/ pivot and positional cues provided to back up to chair before sitting.  Ambulation/Gait General Gait Details:  After pivoting pt declined ambulating, letting out deep sigh when asked.    ADL: ADL Overall ADL's : Needs assistance/impaired Eating/Feeding: NPO Grooming: Wash/dry hands, Wash/dry face, Oral care, Brushing hair, Total assistance, Sitting Upper Body Bathing: Total assistance, Sitting Lower Body Bathing: Total assistance, Bed level Upper Body Dressing : Total assistance, Sitting Lower Body Dressing: Total assistance, Bed level Toilet Transfer: Moderate assistance, +2 for physical assistance, Stand-pivot, BSC Toilet Transfer Details (indicate cue type and reason): unable  Toileting- Clothing Manipulation and Hygiene: Total assistance, Bed level Functional mobility during ADLs: Maximal assistance, +2 for physical assistance (to EOB ) General ADL Comments: Pt with very delayed responses, and decreased initiation   Cognition: Cognition Overall Cognitive Status:  Impaired/Different from baseline Arousal/Alertness: Awake/alert Orientation Level: Other (comment) (UTA) Attention: Focused, Sustained Focused Attention: Appears intact Sustained Attention: Impaired Sustained Attention Impairment: Verbal basic, Functional basic Memory:  (will assess in diagnostic treatment) Awareness: Impaired Awareness Impairment: Emergent impairment Behaviors: Restless, Impulsive Safety/Judgment: Impaired Cognition Arousal/Alertness: Awake/alert Behavior During Therapy: Flat affect Overall Cognitive Status: Impaired/Different from baseline Area of Impairment: Following commands, Attention Current Attention Level: Selective Following Commands: Follows one step commands with increased time Problem Solving: Slow processing, Decreased initiation General Comments: Pt attempted to verbalize today and is inconsistently successful reporting her name and the color of therapists scrubs. Difficult to assess due to: Impaired communication  Physical Exam: Blood pressure 173/74, pulse 65, temperature 98.9 F (37.2 C), temperature source Oral, resp. rate 23, weight 91.3 kg (201 lb 4.5 oz), last menstrual period 03/24/2010, SpO2 96 %. Physical Exam  Vitals reviewed. Constitutional: She appears well-developed and well-nourished.  HENT:  Craniotomy site clean and dry. Nasogastric tube in place  Eyes: Conjunctivae are normal.  Pupils reactive to light  Neck: Normal range of motion. Neck supple. No thyromegaly present.  Cardiovascular: Normal rate and regular rhythm.   Respiratory: Effort normal and breath sounds normal. No respiratory distress.  GI: Soft. Bowel sounds are normal. She exhibits no distension.  Musculoskeletal: She exhibits no edema or tenderness.  Neurological: She is alert.  Patient nonverbal DTRs symmetric Unable to assess MMT in sensation  due to lack of patient participation.  Skin: Skin is warm and dry.  Psychiatric:  Unable to assess    Results for  orders placed or performed during the hospital encounter of 02/17/16 (from the past 48 hour(s))  Glucose, capillary     Status: Abnormal   Collection Time: 02/23/16  1:15 PM  Result Value Ref Range   Glucose-Capillary 183 (H) 65 - 99 mg/dL  Glucose, capillary     Status: Abnormal   Collection Time: 02/23/16  5:15 PM  Result Value Ref Range   Glucose-Capillary 122 (H) 65 - 99 mg/dL  Glucose, capillary     Status: Abnormal   Collection Time: 02/23/16  7:09 PM  Result Value Ref Range   Glucose-Capillary 103 (H) 65 - 99 mg/dL  Glucose, capillary     Status: Abnormal   Collection Time: 02/23/16 11:02 PM  Result Value Ref Range   Glucose-Capillary 149 (H) 65 - 99 mg/dL  Glucose, capillary     Status: Abnormal   Collection Time: 02/24/16  4:19 AM  Result Value Ref Range   Glucose-Capillary 110 (H) 65 - 99 mg/dL  Glucose, capillary     Status: Abnormal   Collection Time: 02/24/16  9:07 AM  Result Value Ref Range   Glucose-Capillary 190 (H) 65 - 99 mg/dL   Comment 1 Notify RN    Comment 2 Document in Chart   Glucose, capillary     Status: Abnormal   Collection Time: 02/24/16 11:26 AM  Result Value Ref Range   Glucose-Capillary 190 (H) 65 - 99 mg/dL   Comment 1 Notify RN    Comment 2 Document in Chart   Glucose, capillary     Status: Abnormal   Collection Time: 02/24/16  3:10 PM  Result Value Ref Range   Glucose-Capillary 127 (H) 65 - 99 mg/dL   Comment 1 Notify RN    Comment 2 Document in Chart   Urinalysis, Routine w reflex microscopic (not at [facility])     Status: Abnormal   Collection Time: 02/24/16  5:15 PM  Result Value Ref Range   Color, Urine YELLOW YELLOW   APPearance CLOUDY (A) CLEAR   Specific Gravity, Urine 1.014 1.005 - 1.030   pH 7.0 5.0 - 8.0   Glucose, UA NEGATIVE NEGATIVE mg/dL   Hgb urine dipstick NEGATIVE NEGATIVE   Bilirubin Urine NEGATIVE NEGATIVE   Ketones, ur NEGATIVE NEGATIVE mg/dL   Protein, ur NEGATIVE NEGATIVE mg/dL   Nitrite POSITIVE (A) NEGATIVE    Leukocytes, UA LARGE (A) NEGATIVE  Urine microscopic-add on     Status: Abnormal   Collection Time: 02/24/16  5:15 PM  Result Value Ref Range   Squamous Epithelial / LPF 0-5 (A) NONE SEEN   WBC, UA TOO NUMEROUS TO COUNT 0 - 5 WBC/hpf   RBC / HPF NONE SEEN 0 - 5 RBC/hpf   Bacteria, UA MANY (A) NONE SEEN  Glucose, capillary     Status: Abnormal   Collection Time: 02/24/16  7:13 PM  Result Value Ref Range   Glucose-Capillary 148 (H) 65 - 99 mg/dL  Glucose, capillary     Status: Abnormal   Collection Time: 02/24/16 11:50 PM  Result Value Ref Range   Glucose-Capillary 136 (H) 65 - 99 mg/dL  Glucose, capillary     Status: Abnormal   Collection Time: 02/25/16  4:29 AM  Result Value Ref Range   Glucose-Capillary 179 (H) 65 - 99 mg/dL  Glucose, capillary     Status: Abnormal  Collection Time: 02/25/16  9:44 AM  Result Value Ref Range   Glucose-Capillary 149 (H) 65 - 99 mg/dL   Comment 1 Notify RN    Comment 2 Document in Chart    Dg Swallowing Func-speech Pathology  02/23/2016  Objective Swallowing Evaluation: Type of Study: MBS-Modified Barium Swallow Study Patient Details Name: SACHI MAW MRN: [id number] Date of Birth: 1933/10/29 Today's Date: 02/23/2016 Time: SLP Start Time (ACUTE ONLY): 1201-SLP Stop Time (ACUTE ONLY): 1214 SLP Time Calculation (min) (ACUTE ONLY): 13 min Past Medical History: Past Medical History Diagnosis Date . Diabetes mellitus  . Hypertension  . Hypercholesterolemia  . Gallstones  . Glaucoma  . Cataracts, bilateral  . COPD (chronic obstructive pulmonary disease) ([place])  . Hypothyroid  . PONV (postoperative nausea and vomiting)  . Asthma  . Chronic kidney disease 2007   kidney cancer and partial nephrctomy-right . Subdural bleeding [facility])  Past Surgical History: Past Surgical History Procedure Laterality Date . Dilation and curettage of uterus   . Partial nephrectomy     cancer . Breast biopsy   . D&c for enlarged uterine  210 . Total abdominal hysterectomy w/ bilateral  salpingoophorectomy  2011 . Cateract extractions and iol   . Cholecystectomy  2008 . Abdominal hysterectomy   . Eye surgery  08/2011,09/2012   bilateral cataracts . Hernia repair  01/19/12   inguinal . Inguinal hernia repair  01/19/2012   Procedure: HERNIA REPAIR INGUINAL ADULT;  Surgeon: Earnstine Regal, MD;  [facility]: [facility];  Service: General;  Laterality: Left;  repair left inguinal hernia with mesh  . Craniotomy Left 02/06/2016   Procedure: CRANIOTOMY HEMATOMA EVACUATION SUBDURAL;  Surgeon: Kevan Ny Ditty, MD;  Location: [facility];  Service: Neurosurgery;  Laterality: Left; . Craniotomy N/A 02/17/2016   Procedure: CRANIOTOMY HEMATOMA EVACUATION SUBDURAL;  Surgeon: Kevan Ny Ditty, MD;  Location: [place] NEURO ORS;  Service: Neurosurgery;  Laterality: N/A;  left . Craniotomy Left 02/18/2016   Procedure: CRANIOTOMY HEMATOMA EVACUATION SUBDURAL;  Surgeon: Earnie Larsson, MD;  Location: [place] NEURO ORS;  Service: Neurosurgery;  Laterality: Left; HPI: 82 y.o. female with h/o DM, HTN, COPD, asthma and chronic kidney disease, who underwent craniotomy x2 for evacuation of chronic SDH parieto-occipital subdural hematoma. Pt was previously admitted 3/6 after fall and was d/c home next day. Pt was readmitted 3/22 due to headaches and neuro changes, then d/c to CIR 3/29. Pt currently admitted from CIR due to neurological decline. Pt has been receiving SLP intervention while in house, focusing on comprehension, expression, social interaction, problem solving and memory. CT Head 4/4 L frontal lobe 2-3 cm area of cytotoxic edema compatible with small acute/evolving L MCA territory infarct. CXR 4/1 no active cardiopulmonary disease. Subjective: pt alert, aphasic Assessment / Plan / Recommendation CHL IP CLINICAL IMPRESSIONS 02/23/2016 Therapy Diagnosis Moderate oral phase dysphagia;Mild pharyngeal phase dysphagia Clinical Impression Pt has a moderate oral and mild pharyngeal dysphagia with no aspiration observed across challenging. She  has piecemeal swallowing and slow oral transit with all consistencies tested, as well as significantly prolonged mastication with soft solids. Once boluses reach her pharynx, she is able to trigger a reflexive swallow with good timing and adequate pharyngeal strength, with only mild residue remaining in the valleculae with purees and soft solids. Of note, pt had intermittent throat clearing throughout the study that was not associated with any airway penetration. Recommend Dys 1 diet for ease of oral preparation and thin liquids, meds crushed in puree. Will continue to follow for tolerance  and advancement. Impact on safety and function Mild aspiration risk   CHL IP TREATMENT RECOMMENDATION 02/23/2016 Treatment Recommendations Therapy as outlined in treatment plan below   Prognosis 02/23/2016 Prognosis for Safe Diet Advancement Good Barriers to Reach Goals -- Barriers/Prognosis Comment -- CHL IP DIET RECOMMENDATION 02/23/2016 SLP Diet Recommendations Dysphagia 1 (Puree) solids;Thin liquid Liquid Administration via Cup;Straw Medication Administration Crushed with puree Compensations Minimize environmental distractions;Slow rate;Small sips/bites;Follow solids with liquid Postural Changes Remain semi-upright after after feeds/meals (Comment);Seated upright at 90 degrees   CHL IP OTHER RECOMMENDATIONS 02/23/2016 Recommended Consults -- Oral Care Recommendations Oral care BID Other Recommendations --   CHL IP FOLLOW UP RECOMMENDATIONS 02/23/2016 Follow up Recommendations Inpatient Rehab   CHL IP FREQUENCY AND DURATION 02/23/2016 Speech Therapy Frequency (ACUTE ONLY) min 2x/week Treatment Duration 2 weeks      CHL IP ORAL PHASE 02/23/2016 Oral Phase Impaired Oral - Pudding Teaspoon -- Oral - Pudding Cup -- Oral - Honey Teaspoon -- Oral - Honey Cup -- Oral - Nectar Teaspoon Delayed oral transit;Piecemeal swallowing Oral - Nectar Cup Delayed oral transit;Piecemeal swallowing Oral - Nectar Straw -- Oral - Thin Teaspoon -- Oral - Thin Cup  Delayed oral transit;Piecemeal swallowing Oral - Thin Straw Delayed oral transit;Piecemeal swallowing Oral - Puree Delayed oral transit;Piecemeal swallowing Oral - Mech Soft Delayed oral transit;Piecemeal swallowing;Impaired mastication Oral - Regular -- Oral - Multi-Consistency -- Oral - Pill -- Oral Phase - Comment --  CHL IP PHARYNGEAL PHASE 02/23/2016 Pharyngeal Phase Impaired Pharyngeal- Pudding Teaspoon -- Pharyngeal -- Pharyngeal- Pudding Cup -- Pharyngeal -- Pharyngeal- Honey Teaspoon -- Pharyngeal -- Pharyngeal- Honey Cup -- Pharyngeal -- Pharyngeal- Nectar Teaspoon Reduced epiglottic inversion;Pharyngeal residue - valleculae;Penetration/Aspiration during swallow;Reduced tongue base retraction Pharyngeal Material enters airway, remains ABOVE vocal cords then ejected out Pharyngeal- Nectar Cup Reduced tongue base retraction Pharyngeal -- Pharyngeal- Nectar Straw -- Pharyngeal -- Pharyngeal- Thin Teaspoon -- Pharyngeal -- Pharyngeal- Thin Cup Reduced tongue base retraction Pharyngeal -- Pharyngeal- Thin Straw Reduced tongue base retraction Pharyngeal -- Pharyngeal- Puree Reduced tongue base retraction;Pharyngeal residue - valleculae Pharyngeal -- Pharyngeal- Mechanical Soft Reduced tongue base retraction;Pharyngeal residue - valleculae Pharyngeal -- Pharyngeal- Regular -- Pharyngeal -- Pharyngeal- Multi-consistency -- Pharyngeal -- Pharyngeal- Pill -- Pharyngeal -- Pharyngeal Comment --  CHL IP CERVICAL ESOPHAGEAL PHASE 02/23/2016 Cervical Esophageal Phase WFL Pudding Teaspoon -- Pudding Cup -- Honey Teaspoon -- Honey Cup -- Nectar Teaspoon -- Nectar Cup -- Nectar Straw -- Thin Teaspoon -- Thin Cup -- Thin Straw -- Puree -- Mechanical Soft -- Regular -- Multi-consistency -- Pill -- Cervical Esophageal Comment -- No flowsheet data found. Germain Osgood, M.A. CCC-SLP [phone] Germain Osgood 02/23/2016, 1:28 PM               Medical Problem List and Plan: 1.  Right hemiparesthesias/aphasia/dysphasia  secondary to traumatic subdural hematoma status post left craniotomy evacuated subdural hematoma with reaccumulation of fluid craniotomy 2 2.  DVT Prophylaxis/Anticoagulation: SCDs. Monitor for any signs of DVT 3. Pain Management: Tylenol as needed 4. Dysphagia. Dysphagia #1 thin liquids. Nasogastric tube remains in place. Monitor for any signs of aspiration. Follow-up speech therapy 5. Neuropsych: This patient is not capable of making decisions on her own behalf. 6. Skin/Wound Care: Routine skin checks 7. Fluids/Electrolytes/Nutrition: Routine I&O with follow-up chemistries 8. Seizure disorder. Keppra 1250 mg every 12 hours. Monitor for any seizure activity 9. Hypertension. Lisinopril 20 mg daily at bedtime, verapamil 40 mg 3 times a day,Lopressor 25 mg BID. Monitor with increased mobility 10. Diabetes mellitus.  Hemoglobin A1c 6.7. Amaryl 4 mg daily at bedtime. Check blood sugars before meals and at bedtime 11. COPD. Continue Dulera and nebulizers as directed 12. UTI. Continue Rocephin for now. Await cultures and sensitivities 13. Hypothyroidism. Thyroid 60 mg daily at bedtime. Latest TSH 0.285  Post Admission Physician Evaluation: Functional deficits secondary  to traumatic subdural hematoma status post left craniotomy evacuated subdural hematoma with reaccumulation of fluid craniotomy 2. 1. Patient is admitted to receive collaborative, interdisciplinary care between the physiatrist, rehab nursing staff, and therapy team. 2. Patient's level of medical complexity and substantial therapy needs in context of that medical necessity cannot be provided at a lesser intensity of care such as a SNF. 3. Patient has experienced substantial functional loss from his/her baseline which was documented above under the "Functional History" and "Functional Status" headings.  Judging by the patient's diagnosis, physical exam, and functional history, the patient has potential for functional progress which will  result in measurable gains while on inpatient rehab.  These gains will be of substantial and practical use upon discharge  in facilitating mobility and self-care at the household level. 4. Physiatrist will provide 24 hour management of medical needs as well as oversight of the therapy plan/treatment and provide guidance as appropriate regarding the interaction of the two. 5. 24 hour rehab nursing will assist with bladder management, bowel management, safety, skin/wound care, disease management, medication administration and patient education  and help integrate therapy concepts, techniques,education, etc. 6. PT will assess and treat for/with: Lower extremity strength, range of motion, stamina, balance, functional mobility, safety, adaptive techniques and equipment, woundcare, coping skills, pain control, education.   Goals are: Min assist/supervision. 7. OT will assess and treat for/with: ADL's, functional mobility, safety, upper extremity strength, adaptive techniques and equipment, wound mgt, ego support, and community reintegration.   Goals are: Min assist/supervision. Therapy may not proceed with showering this patient. 8. SLP will assess and treat for/with: Speech, language, cognition.  Goals are: Mod/min assist. 9. Case Management and Social Worker will assess and treat for psychological issues and discharge planning. 10. Team conference will be held weekly to assess progress toward goals and to determine barriers to discharge. 11. Patient will receive at least 3 hours of therapy per day at least 5 days per week. 12. ELOS: 19-22 days.       13. Prognosis:  good and fair  Delice Lesch, MD 02/25/2016

## 2016-02-26 NOTE — Progress Notes (Signed)
Cristina Gong, RN Rehab Admission Coordinator Signed Physical Medicine and Rehabilitation PMR Pre-admission 02/26/2016 9:28 AM  Related encounter: Admission (Current) from 02/17/2016 in [place] Collapse All   PMR Admission Coordinator Pre-Admission Assessment  Patient: Carolyn [name] is an 82 y.o., female MRN: [id number] DOB: 29-Oct-1933 Height: 5\' 6"  (167.6 cm) Weight: 90.9 kg (200 lb 6.4 oz)  Insurance Information HMO: PPO: yes PCP: IPA: 80/20: OTHER: Medicare advantage plan PRIMARY: [employer] Policy#: [id number] Subscriber: pt CM Name: Lala Lund Phone#: [phone] Fax#: [phone] approved for 7 days when updates due 0000000 Pre-Cert#: [id number] Employer: retired Benefits: Phone #: [phone] Name: 02/25/2016 Eff. Date: 11/17/15 Deduct: none Out of Pocket Max: $3400 Life Max: none CIR: $225 per day days 1-6 then covers 100% SNF: no copay days 1-20: $150 copay days 21-100 Outpatient: $15 copay per visit Co-Pay: no visit limit Home Health: $25 copay per visit Co-Pay: no visit limit DME: 80% Co-Pay: 20% Providers: in netowrk  SECONDARY: none   Medicaid Application Date: Case Manager:  Disability Application Date: Case Worker:   Emergency Contact Information Contact Information    Name Relation Home Work Mobile   Reynolds,[name] [phone]  [phone]   Gardenia, Beier Daughter   [phone]   [place], Grisanti [phone]       Current Medical History  Patient Admitting Diagnosis: Traumatic Subdural Hematoma  History of Present Illness: Carolyn [name] is a 82 y.o. right handed female with history of hypertension, diabetes mellitus, COPD. Patient  with with history of subdural hematoma after multiple falls beginning in March 2017 as well as left radial head fracture. Family notes patient with persistent headaches. Patient with recent simple partial seizure and presented to the emergency department 02/06/2016. Cranial CT scan revealed enlarging chronic subdural hematoma. Underwent left craniotomy for evacuation of subdural hematoma 02/06/2016 per Dr. Cyndy Freeze. Maintained on Keppra for suspect seizure. Tolerating a regular consistency diet. Repeat head CT on 3/26 suggesting increased pain midline shift. PhysicalAnd occupational therapy evaluations completed and ongoing. M.D. has requested physical medicine rehabilitation consult.Patient was admitted for comprehensive rehabilitation program 02/12/2016. Patient was slow progress once admitted to rehabilitation services. Persistent neurogenic headaches with trials of Topamax discontinued due to altered mental status and appeared to be more arousable after medication adjustments. Patient remained on Keppra as advise. A cranial CT scan completed 02/14/2016 due to some increasing right-sided weakness and altered mental status showed evolutionary changes within the subdural hematoma decreasing pneumocephalus and stable since prior tracings of monitoring of left to right midline shift. Findings mild hypokalemia 3.2 supplement added. A urine study showed no growth. Appetite somewhat diminishing was maintained on gentle IV fluids for hydration. Cranial CT scan again completed 02/17/2016 due to waxing and waning of mental status again unchanged size of residual mixed densities left subdural hematoma 6 mm rightward midline shift again unchanged. Neurosurgery contacted and felt with decline neurologically and left frontal subdural fluid collection still causing mass effect that patient should return back to the OR. She was discharged to acute care services 02/17/2016 and underwent re-entry left craniotomy for evacuation of  subdural hematoma per Dr. Cyndy Freeze. Postoperatively continued to show neurological decline becoming less verbal no longer moving her right upper extremity. Follow-up CT demonstrated reaccumulation of left convex to the subdural hematoma with significant mass effect. She again returned to the OR for evacuation of residual chronic subdural hematoma on 02/18/16. Repeat EEG 02/21/2016 suggestive of possible epileptogenic potential in the left frontoparietal region  with superimposed mild encephalopathy. Her Keppra was adjusted to 1250 mg every 12 hours per neurology services. Diet slowly advanced to a dysphagia #1 thin liquid diet with nasogastric tube remaining in place for nutritional support. Follow-up urine study 02/24/1999 positive nitrite and large leukocytes cultures and sensitivities pending placed on empiric Rocephin.    Past Medical History  Past Medical History  Diagnosis Date  . Diabetes mellitus   . Hypertension   . Hypercholesterolemia   . Gallstones   . Glaucoma   . Cataracts, bilateral   . COPD (chronic obstructive pulmonary disease) ([place])   . Hypothyroid   . PONV (postoperative nausea and vomiting)   . Asthma   . Chronic kidney disease 2007    kidney cancer and partial nephrctomy-right  . Subdural bleeding [facility])     Family History  family history is not on file.  Prior Rehab/Hospitalizations:  Has the patient had major surgery during 100 days prior to admission? Yes  Current Medications   Current facility-administered medications:  . 0.9 % sodium chloride infusion, , Intravenous, Continuous, Raylene Miyamoto, MD, Last Rate: 10 mL/hr at 02/22/16 1300 . albuterol (PROVENTIL) (2.5 MG/3ML) 0.083% nebulizer solution 2.5 mg, 2.5 mg, Nebulization, Q4H PRN, Kevan Ny Ditty, MD . antiseptic oral rinse (CPC / CETYLPYRIDINIUM CHLORIDE 0.05%) solution 7 mL, 7 mL, Mouth Rinse, BID, Kevan Ny Ditty, MD, 7 mL at 02/25/16 2200 .  bisacodyl (DULCOLAX) EC tablet 5 mg, 5 mg, Oral, Daily PRN, Kevan Ny Ditty, MD . cefTRIAXone (ROCEPHIN) 1 g in dextrose 5 % 50 mL IVPB, 1 g, Intravenous, Q24H, Kevan Ny Ditty, MD, 1 g at 02/25/16 2000 . docusate (COLACE) 50 MG/5ML liquid 100 mg, 100 mg, Oral, BID, Kevan Ny Ditty, MD, 100 mg at 02/25/16 2044 . feeding supplement (ENSURE ENLIVE) (ENSURE ENLIVE) liquid 237 mL, 237 mL, Oral, BID BM, Kevan Ny Ditty, MD, 237 mL at 02/25/16 1937 . feeding supplement (JEVITY 1.2 CAL) liquid 1,000 mL, 1,000 mL, Per Tube, Continuous, Kevan Ny Ditty, MD, Last Rate: 55 mL/hr at 02/26/16 0845, 1,000 mL at 02/26/16 0845 . glimepiride (AMARYL) tablet 4 mg, 4 mg, Oral, QHS, Kevan Ny Ditty, MD, 4 mg at 02/25/16 2042 . hydrALAZINE (APRESOLINE) injection 10-40 mg, 10-40 mg, Intravenous, Q1H PRN, Kara Mead V, MD, 20 mg at 02/25/16 0509 . HYDROmorphone (DILAUDID) injection 0.5-1 mg, 0.5-1 mg, Intravenous, Q2H PRN, Kevan Ny Ditty, MD, 0.5 mg at 02/18/16 2010 . insulin aspart (novoLOG) injection 0-15 Units, 0-15 Units, Subcutaneous, 6 times per day, Tamala Fothergill, MD, 3 Units at 02/26/16 1023 . latanoprost (XALATAN) 0.005 % ophthalmic solution 1 drop, 1 drop, Both Eyes, QHS, Kevan Ny Ditty, MD, 1 drop at 02/25/16 2045 . levETIRAcetam (KEPPRA) 100 MG/ML solution 1,250 mg, 1,250 mg, Oral, Q12H, Kevan Ny Ditty, MD, 1,250 mg at 02/25/16 2354 . lisinopril (PRINIVIL,ZESTRIL) tablet 20 mg, 20 mg, Oral, QHS, Kevan Ny Ditty, MD, 20 mg at 02/25/16 2042 . metoprolol tartrate (LOPRESSOR) tablet 25 mg, 25 mg, Oral, BID, Kevan Ny Ditty, MD, 25 mg at 02/26/16 1023 . mometasone-formoterol (DULERA) 200-5 MCG/ACT inhaler 2 puff, 2 puff, Inhalation, BID, Kevan Ny Ditty, MD, 2 puff at 02/25/16 2224 . naloxone [facility]) injection 0.08 mg, 0.08 mg, Intravenous, PRN, Kevan Ny Ditty, MD . ondansetron [facility]) tablet 4 mg, 4 mg, Oral, Q4H PRN **OR**  ondansetron (ZOFRAN) injection 4 mg, 4 mg, Intravenous, Q4H PRN, Kevan Ny Ditty, MD, 4 mg at 02/18/16 0311 . pantoprazole sodium (PROTONIX) 40 mg/20 mL oral suspension 40 mg,  40 mg, Per Tube, QHS, Kimberly B Hammons, RPH, 40 mg at 02/25/16 2044 . polyethylene glycol (MIRALAX / GLYCOLAX) packet 17 g, 17 g, Oral, Daily PRN, Kevan Ny Ditty, MD . promethazine (PHENERGAN) tablet 12.5-25 mg, 12.5-25 mg, Oral, Q4H PRN, Kevan Ny Ditty, MD . senna [facility]) tablet 8.6 mg, 1 tablet, Oral, BID, Kevan Ny Ditty, MD, 8.6 mg at 02/25/16 2042 . thyroid (ARMOUR) tablet 60 mg, 60 mg, Oral, QHS, Kevan Ny Ditty, MD, 60 mg at 02/25/16 2043 . verapamil (CALAN) tablet 40 mg, 40 mg, Oral, 3 times per day, Ritta Slot, NP, 40 mg at 02/26/16 [id number]  Patients Current Diet: DIET - DYS 1 Room service appropriate?: Yes; Fluid consistency:: Thin Diet - low sodium heart healthy Cortrak for tube feeds continuous Poor appetite  Precautions / Restrictions Precautions Precautions: Fall Restrictions Weight Bearing Restrictions: No   Has the patient had 2 or more falls or a fall with injury in the past year?Yes  Prior Activity Level Pt independent and driving prior to initial fall. Loved walking her dog and working in her flowers. Always napped after lunch.  Home Assistive Devices / Herbalist and cane  Prior Device Use: Indicate devices/aids used by the patient prior to current illness, exacerbation or injury? Walker And cane pta  Prior Functional Level Prior Function Level of Independence: Needs assistance Gait / Transfers Assistance Needed: Pt was ambulating short distances with Min A using RW on CIR. Prior to admission 3/22, pt was mod I ADL's / Homemaking Assistance Needed: Pt required min - mod A on CIR. Prior to initial fall was very independent  Communication / Swallowing Assistance Needed: ` Would use her RW outside when her knees bothered her for she had bilateral  knee arthritis  Self Care: Did the patient need help bathing, dressing, using the toilet or eating? Independent  Indoor Mobility: Did the patient need assistance with walking from room to room (with or without device)? Independent  Stairs: Did the patient need assistance with internal or external stairs (with or without device)? Independent  Functional Cognition: Did the patient need help planning regular tasks such as shopping or remembering to take medications? Independent  Current Functional Level Cognition  Arousal/Alertness: Awake/alert Overall Cognitive Status: Impaired/Different from baseline Difficult to assess due to: Impaired communication Current Attention Level: Sustained Orientation Level: Other (comment) (UTA) Following Commands: Follows one step commands with increased time Safety/Judgement: Decreased awareness of safety, Decreased awareness of deficits General Comments: Pt attempting to progress with transfer with therapist attempting to transfer to chair not bed. pt needed max cue redirection. pt following commands demonstrating good receptive ability Attention: Focused, Sustained Focused Attention: Appears intact Sustained Attention: Impaired Sustained Attention Impairment: Verbal basic, Functional basic Memory: (will assess in diagnostic treatment) Awareness: Impaired Awareness Impairment: Emergent impairment Behaviors: Restless, Impulsive Safety/Judgment: Impaired   Extremity Assessment (includes Sensation/Coordination)  Upper Extremity Assessment: Defer to OT evaluation LUE Deficits / Details: Pt initially made no attempts to move Lt UE. But when told to wipe her mouth, she attempted to reach for the washcloth and required mod A to wipe mouth. Unable to perform a second time - appears apraxic. Pt will attempt to flex fingers minimally   Lower Extremity Assessment: Generalized weakness, RLE deficits/detail RLE Deficits / Details: pt initially not  actively moving LE, but while sitting EOB was noted to attempt to shift positioning of LE spontaneously. Possible increased tone vs resisting movement. Pain sensation intact. RLE Coordination: decreased fine motor, decreased gross motor  ADLs  Overall ADL's : Needs assistance/impaired Eating/Feeding: NPO Grooming: Wash/dry hands, Wash/dry face, Oral care, Brushing hair, Total assistance, Sitting Upper Body Bathing: Total assistance, Sitting Lower Body Bathing: Total assistance, Bed level Upper Body Dressing : Total assistance, Sitting Lower Body Dressing: Moderate assistance, Bed level Lower Body Dressing Details (indicate cue type and reason): pt able to don socks with incr time and effort after OT started socks over toes Toilet Transfer: Minimal assistance, +2 for physical assistance, +2 for safety/equipment, Stand-pivot Toilet Transfer Details (indicate cue type and reason): pt needed cues for safety and hand placement. pt needed (A) to power up into standin Toileting- Water quality scientist and Hygiene: Total assistance, Sit to/from stand Toileting - Clothing Manipulation Details (indicate cue type and reason): total (A) for peri care and max cues to transfer R not left Functional mobility during ADLs: Maximal assistance, +2 for physical assistance (to EOB ) General ADL Comments: questions some R inattention based on decr visual attention to son on R side of bed and transfers x2 to the R side    Mobility  Overal bed mobility: Needs Assistance Bed Mobility: Supine to Sit Sidelying to sit: Mod assist Supine to sit: Min guard Sit to supine: Max assist, +2 for physical assistance General bed mobility comments: pt able with incr time to progress to EOB. pt static sitting EOB min guard (A)    Transfers  Overall transfer level: Needs assistance Equipment used: Rolling walker (2 wheeled) Transfer via Lift Equipment: Stedy Transfers: Sit to/from [employer] to Stand: Min assist,  +2 physical assistance Stand pivot transfers: Min assist, +2 physical assistance General transfer comment: pt needed cues for safety    Ambulation / Gait / Stairs / Wheelchair Mobility  Ambulation/Gait General Gait Details: After pivoting pt declined ambulating, letting out deep sigh when asked.    Posture / Balance Dynamic Sitting Balance Sitting balance - Comments: Needs UE support and unable to accept balance challenges without A.  Balance Overall balance assessment: Needs assistance Sitting-balance support: Bilateral upper extremity supported, Feet supported Sitting balance-Leahy Scale: Fair Sitting balance - Comments: Needs UE support and unable to accept balance challenges without A.  Standing balance support: Bilateral upper extremity supported, During functional activity Standing balance-Leahy Scale: Poor Standing balance comment: Relies on RW and outside assist for support    Special needs/care consideration  Skin surgical incision with echymosis and staples  Bowel mgmt: LBM 4/11 continent Bladder mgmt: incontinent Diabetic mgmt yes Bed and chair alarm due to decreased safety awarenesss Buttock right and left with some moisture Areas with nursing applying barrier cream   Previous Home Environment Living Arrangements: Spouse/significant other, Children Lives With: Daughter, Spouse Available Help at Discharge: Family, Available 24 hours/day Type of Home: House Home Layout: One level Home Access: Stairs to enter Entrance Stairs-Rails: Left, Right, Can reach both Entrance Stairs-Number of Steps: 2 Bathroom Shower/Tub: Tub/shower unit, Multimedia programmer: Handicapped height Bathroom Accessibility: Yes How Accessible: Accessible via walker Home Care Services: No Additional Comments: daughter lives w/ pt and pt's husband. Pt's husband mainly sits or stays in bed and is not very active, requiring assist from daughter  for ADLs and ambulation.  Discharge Living Setting Plans for Discharge Living Setting: Patient's home, Lives with (comment) Type of Home at Discharge: House Discharge Home Layout: One level Discharge Home Access: Stairs to enter Entrance Stairs-Rails: Right, Left, Can reach both Entrance Stairs-Number of Steps: 2 Discharge Bathroom Shower/Tub: Tub/shower unit, Walk-in shower Discharge Bathroom Toilet: Handicapped height Discharge  Bathroom Accessibility: Yes How Accessible: Accessible via walker Does the patient have any problems obtaining your medications?: No  Social/Family/Support Systems Patient Roles: Spouse, Parent Contact Information: [name], daughter Anticipated Caregiver: daughter, [name] Anticipated Ambulance person Information: see above Ability/Limitations of Caregiver: daughter worked for 33 years for [employer] OR, either surgical tech or RM, states she has a bad back Caregiver Availability: 24/7 Discharge Plan Discussed with Primary Caregiver: Yes Is Caregiver In Agreement with Plan?: Yes Does Caregiver/Family have Issues with Lodging/Transportation while Pt is in Rehab?: No  Goals/Additional Needs Patient/Family Goal for Rehab: supervision to min assist with PT, OT and SLP Expected length of stay: 20-24 days Pt/Family Agrees to Admission and willing to participate: Yes Program Orientation Provided & Reviewed with Pt/Caregiver Including Roles & Responsibilities: Yes  Decrease burden of Care through IP rehab admission: n/a  Possible need for SNF placement upon discharge:not anticipated  Patient Condition: The patient's medical and functional status has been reviewed by myself and the Rehabilitation Physician since her discharge to acute hospital for medical decline. Patient's condition is appropriate for intensive rehabilitative care in an inpatient rehabilitation facility. See history of present illness for medical update. Patinet functionally is mod Assist with max  cuing for functional adls and mobility. Patient's medical and functional status have been discussed with the Rehabilitation physician and patient is appropriate for admission today.  Preadmission Screen Completed By: Cleatrice Burke, 02/26/2016 10:52 AM ______________________________________________________________________  Discussed status with Dr. Posey Pronto on 02/26/2016 at 1053 and received telephone approval for admission today.  Admission Coordinator: Cleatrice Burke, time [id number] Date 02/26/2016.          Cosigned by: Ankit [name], MD at 02/26/2016 11:27 AM  Revision History     Date/Time User Provider Type Action   02/26/2016 11:27 AM Ankit [name], MD Physician Cosign   02/26/2016 11:00 AM Cristina Gong, RN Rehab Admission Coordinator Sign   02/26/2016 10:53 AM Cristina Gong, RN Rehab Admission Coordinator Sign   View Details Report

## 2016-02-26 NOTE — Progress Notes (Signed)
Pt transferred to Rehab from 3S15. Alert and orientated x 3. Expressive aphasia, however,  follows commands and responds appropriately. Daughter at bedside declined orientation, as her mother was a pt on Rehab,  prior to transfer to acute for surgical intervention.

## 2016-02-26 NOTE — Progress Notes (Signed)
Pt transferred to [place] rehab by Dolores Lory, RN and 4W NT. All belongings sent with patient and daughter, [name]. CCMD and eICU notified of transfer. VSS.

## 2016-02-26 NOTE — Interval H&P Note (Signed)
Carolyn [name] was admitted today to Inpatient Rehabilitation with the diagnosis of traumatic subdural hematoma status post left craniotomy evacuated subdural hematoma with reaccumulation of fluid craniotomy 2.  The patient's history has been reviewed, patient examined, and there is no change in status.  Patient continues to be appropriate for intensive inpatient rehabilitation.  I have reviewed the patient's chart and labs.  Questions were answered to the patient's satisfaction. The PAPE has been reviewed and assessment remains appropriate.  Carolyn [name] Carolyn [name] 02/26/2016, 3:47 PM

## 2016-02-26 NOTE — Progress Notes (Signed)
Physical Therapy Treatment Patient Details Name: Carolyn [name] MRN: [id number] DOB: [birth year] Today's Date: 02/26/2016    History of Present Illness Pt admitted from CIR due to neurological decline.  He underwent craniotomy x 2 for evacuation of chronic SDH Lt parieto-occipital subdural hemoatoma.   Pt suffered fall 3/6 after fall.  She sustained SDH and was discharged home 3/7.  She was readmitted 02/05/16  due to headaches and neuro changes.  She underwent craniotomy 02/06/16.  She was discharged to [facility] 3./29/17.  PMH includes:  DM, HTN, Glaucoma, COPD, Asthma, Kidney CA.  She sustained Lt radius fracture initial fall 01/20/16.     PT Comments    Carolyn [name] was reluctant to participate w/ therapy but is agreeable w/ encouragement from therapist and daughter. She ambulated 15 ft in her room using RW and requiring min assist. She remains appropriate for CIR.  Follow Up Recommendations  CIR     Equipment Recommendations  None recommended by PT    Recommendations for Other Services       Precautions / Restrictions Precautions Precautions: Fall Restrictions Weight Bearing Restrictions: No    Mobility  Bed Mobility Overal bed mobility: Needs Assistance Bed Mobility: Supine to Sit;Sit to Supine     Supine to sit: HOB elevated;Min guard Sit to supine: Min assist   General bed mobility comments: Increased time w/ use of bed rail and HOB elevated. Min assist to support trunk when returning to supine.  Transfers Overall transfer level: Needs assistance Equipment used: Rolling walker (2 wheeled) Transfers: Sit to/from Stand Sit to Stand: Min guard         General transfer comment: Cues for hand placement and safe technique using RW. Pt is slow to stand, requiring min guard assist for safety.  Cues for reaching back to sit down w/ poorly controlled descent to sit on bed.  Ambulation/Gait Ambulation/Gait assistance: Min assist Ambulation Distance (Feet): 15 Feet Assistive  device: Rolling walker (2 wheeled) Gait Pattern/deviations: Decreased stride length;Trunk flexed   Gait velocity interpretation: <1.8 ft/sec, indicative of risk for recurrent falls General Gait Details: Flexed posture which pt does not correct following verbal cues.  Assist managing RW when turning in room.  Pt fatigues quickly and repeatedly attempts to sit before correctly positioned at bedside.   Stairs            Wheelchair Mobility    Modified Rankin (Stroke Patients Only)       Balance Overall balance assessment: Needs assistance Sitting-balance support: Bilateral upper extremity supported;Feet supported Sitting balance-Leahy Scale: Fair Sitting balance - Comments: Close min guard due to pt's generalized fatigue   Standing balance support: Bilateral upper extremity supported;During functional activity Standing balance-Leahy Scale: Poor Standing balance comment: Relies on Bil UE support                    Cognition Arousal/Alertness: Awake/alert Behavior During Therapy: Flat affect Overall Cognitive Status: Impaired/Different from baseline Area of Impairment: Following commands;Attention   Current Attention Level: Alternating   Following Commands: Follows one step commands with increased time       General Comments: Pt attempted to verbalize today and is, for the most part, unintelligible    Exercises      General Comments General comments (skin integrity, edema, etc.): Pt reluctant to participate w/ therapy but is agreeable w/ encouragement from therapist and daughter      Pertinent Vitals/Pain Pain Assessment: No/denies pain Faces Pain Scale: No hurt Pain Intervention(s): Limited  activity within patient's tolerance;Monitored during session    Home Living                      Prior Function            PT Goals (current goals can now be found in the care plan section) Acute Rehab PT Goals Patient Stated Goal: unable to state PT  Goal Formulation: With patient/family Time For Goal Achievement: 03/04/16 Potential to Achieve Goals: Good Progress towards PT goals: Progressing toward goals    Frequency  Min 3X/week    PT Plan Current plan remains appropriate    Co-evaluation             End of Session Equipment Utilized During Treatment: Gait belt Activity Tolerance: Patient limited by fatigue Patient left: with call bell/phone within reach;with family/visitor present;in bed;with bed alarm set;with SCD's reapplied     Time: [id number] PT Time Calculation (min) (ACUTE ONLY): 25 min  Charges:  $Gait Training: 8-22 mins $Therapeutic Activity: 8-22 mins                    G Codes:      Carolyn [name] PT, DPT  Pager: [phone] Phone: [phone] 02/26/2016, 11:15 AM

## 2016-02-26 NOTE — Plan of Care (Signed)
Problem: RH PAIN MANAGEMENT Goal: RH STG PAIN MANAGED AT OR BELOW PT'S PAIN GOAL <4

## 2016-02-26 NOTE — PMR Pre-admission (Signed)
PMR Admission Coordinator Pre-Admission Assessment  Patient: Carolyn [name] is an 82 y.o., female MRN: [id number] DOB: [birth year] Height: 5\' 6"  (167.6 cm) Weight: 90.9 kg (200 lb 6.4 oz)              Insurance Information HMO:     PPO: yes     PCP:      IPA:      80/20:      OTHER: Medicare advantage plan PRIMARY: [employer]       Policy#: [id number]      Subscriber: pt CM Name: Lala Lund      Phone#: [phone]     Fax#: [phone] approved for 7 days when updates due 0000000 Pre-Cert#: [id number]      Employer: retired Benefits:  Phone #: [phone]     Name: 02/25/2016 Eff. Date: 11/17/15     Deduct: none      Out of Pocket Max: $3400      Life Max: none CIR: $225 per day days 1-6 then covers 100%      SNF: no copay days 1-20: $150 copay days 21-100 Outpatient: $15 copay per visit     Co-Pay: no visit limit Home Health: $25 copay per visit      Co-Pay: no visit limit DME: 80%     Co-Pay: 20% Providers: in netowrk  SECONDARY: none       Medicaid Application Date:       Case Manager:  Disability Application Date:       Case Worker:   Emergency Contact Information Contact Information    Name Relation Home Work Mobile   Reynolds,[name] [phone]  [phone]   Bhavya, Fabien Daughter   [phone]   Lazaria, Kudrick [phone]       Current Medical History  Patient Admitting Diagnosis: Traumatic Subdural Hematoma  History of Present Illness: Carolyn [name] is a 82 y.o. right handed female with history of hypertension, diabetes mellitus, COPD.  Patient with with history of subdural hematoma after multiple falls beginning in March 2017 as well as left radial head fracture. Family notes patient with persistent headaches. Patient with recent simple partial seizure and presented to the emergency department 02/06/2016. Cranial CT scan revealed enlarging chronic subdural hematoma. Underwent left craniotomy for evacuation of subdural hematoma 02/06/2016 per Dr. Cyndy Freeze.  Maintained on Keppra for suspect seizure. Tolerating a regular consistency diet. Repeat head CT on 3/26 suggesting increased pain midline shift. PhysicalAnd occupational therapy evaluations completed and ongoing. M.D. has requested physical medicine rehabilitation consult.Patient was admitted for comprehensive rehabilitation program 02/12/2016. Patient was slow progress once admitted to rehabilitation services. Persistent neurogenic headaches with trials of Topamax discontinued due to altered mental status and appeared to be more arousable after medication adjustments. Patient remained on Keppra as advise. A cranial CT scan completed 02/14/2016 due to some increasing right-sided weakness and altered mental status showed evolutionary changes within the subdural hematoma decreasing pneumocephalus and stable since prior tracings of monitoring of left to right midline shift. Findings mild hypokalemia 3.2 supplement added. A urine study showed no growth. Appetite somewhat diminishing was maintained on gentle IV fluids for hydration. Cranial CT scan again completed 02/17/2016 due to waxing and waning of mental status again unchanged size of residual mixed densities left subdural hematoma 6 mm rightward midline shift again unchanged. Neurosurgery contacted and felt with decline neurologically and left frontal subdural fluid collection still causing mass effect that patient should return back to the OR. She was discharged to acute care services  02/17/2016 and underwent re-entry left craniotomy for evacuation of subdural hematoma per Dr. Cyndy Freeze. Postoperatively continued to show neurological decline becoming less verbal no longer moving her right upper extremity. Follow-up CT demonstrated reaccumulation of left convex to the subdural hematoma with significant mass effect. She again returned to the OR for evacuation of residual chronic subdural hematoma on 02/18/16. Repeat EEG 02/21/2016 suggestive of possible epileptogenic  potential in the left frontoparietal region with superimposed mild encephalopathy. Her Keppra was adjusted to 1250 mg every 12 hours per neurology services. Diet slowly advanced to a dysphagia #1 thin liquid diet with nasogastric tube remaining in place for nutritional support. Follow-up urine study 02/24/1999 positive nitrite and large leukocytes cultures and sensitivities pending placed on empiric Rocephin.    Past Medical History  Past Medical History  Diagnosis Date  . Diabetes mellitus   . Hypertension   . Hypercholesterolemia   . Gallstones   . Glaucoma   . Cataracts, bilateral   . COPD (chronic obstructive pulmonary disease) ([place])   . Hypothyroid   . PONV (postoperative nausea and vomiting)   . Asthma   . Chronic kidney disease 2007    kidney cancer and partial nephrctomy-right  . Subdural bleeding [facility])     Family History  family history is not on file.  Prior Rehab/Hospitalizations:  Has the patient had major surgery during 100 days prior to admission? Yes  Current Medications   Current facility-administered medications:  .  0.9 %  sodium chloride infusion, , Intravenous, Continuous, Raylene Miyamoto, MD, Last Rate: 10 mL/hr at 02/22/16 1300 .  albuterol (PROVENTIL) (2.5 MG/3ML) 0.083% nebulizer solution 2.5 mg, 2.5 mg, Nebulization, Q4H PRN, Kevan Ny Ditty, MD .  antiseptic oral rinse (CPC / CETYLPYRIDINIUM CHLORIDE 0.05%) solution 7 mL, 7 mL, Mouth Rinse, BID, Kevan Ny Ditty, MD, 7 mL at 02/25/16 2200 .  bisacodyl (DULCOLAX) EC tablet 5 mg, 5 mg, Oral, Daily PRN, Kevan Ny Ditty, MD .  cefTRIAXone (ROCEPHIN) 1 g in dextrose 5 % 50 mL IVPB, 1 g, Intravenous, Q24H, Kevan Ny Ditty, MD, 1 g at 02/25/16 2000 .  docusate (COLACE) 50 MG/5ML liquid 100 mg, 100 mg, Oral, BID, Kevan Ny Ditty, MD, 100 mg at 02/25/16 2044 .  feeding supplement (ENSURE ENLIVE) (ENSURE ENLIVE) liquid 237 mL, 237 mL, Oral, BID BM, Kevan Ny Ditty, MD, 237 mL at  02/25/16 1937 .  feeding supplement (JEVITY 1.2 CAL) liquid 1,000 mL, 1,000 mL, Per Tube, Continuous, Kevan Ny Ditty, MD, Last Rate: 55 mL/hr at 02/26/16 0845, 1,000 mL at 02/26/16 0845 .  glimepiride (AMARYL) tablet 4 mg, 4 mg, Oral, QHS, Kevan Ny Ditty, MD, 4 mg at 02/25/16 2042 .  hydrALAZINE (APRESOLINE) injection 10-40 mg, 10-40 mg, Intravenous, Q1H PRN, Kara Mead V, MD, 20 mg at 02/25/16 0509 .  HYDROmorphone (DILAUDID) injection 0.5-1 mg, 0.5-1 mg, Intravenous, Q2H PRN, Kevan Ny Ditty, MD, 0.5 mg at 02/18/16 2010 .  insulin aspart (novoLOG) injection 0-15 Units, 0-15 Units, Subcutaneous, 6 times per day, Tamala Fothergill, MD, 3 Units at 02/26/16 1023 .  latanoprost (XALATAN) 0.005 % ophthalmic solution 1 drop, 1 drop, Both Eyes, QHS, Kevan Ny Ditty, MD, 1 drop at 02/25/16 2045 .  levETIRAcetam (KEPPRA) 100 MG/ML solution 1,250 mg, 1,250 mg, Oral, Q12H, Kevan Ny Ditty, MD, 1,250 mg at 02/25/16 2354 .  lisinopril (PRINIVIL,ZESTRIL) tablet 20 mg, 20 mg, Oral, QHS, Kevan Ny Ditty, MD, 20 mg at 02/25/16 2042 .  metoprolol tartrate (LOPRESSOR) tablet 25 mg,  25 mg, Oral, BID, Kevan Ny Ditty, MD, 25 mg at 02/26/16 1023 .  mometasone-formoterol (DULERA) 200-5 MCG/ACT inhaler 2 puff, 2 puff, Inhalation, BID, Kevan Ny Ditty, MD, 2 puff at 02/25/16 2224 .  naloxone [facility]) injection 0.08 mg, 0.08 mg, Intravenous, PRN, Kevan Ny Ditty, MD .  ondansetron [facility]) tablet 4 mg, 4 mg, Oral, Q4H PRN **OR** ondansetron (ZOFRAN) injection 4 mg, 4 mg, Intravenous, Q4H PRN, Kevan Ny Ditty, MD, 4 mg at 02/18/16 0311 .  pantoprazole sodium (PROTONIX) 40 mg/20 mL oral suspension 40 mg, 40 mg, Per Tube, QHS, Kimberly B Hammons, RPH, 40 mg at 02/25/16 2044 .  polyethylene glycol (MIRALAX / GLYCOLAX) packet 17 g, 17 g, Oral, Daily PRN, Kevan Ny Ditty, MD .  promethazine (PHENERGAN) tablet 12.5-25 mg, 12.5-25 mg, Oral, Q4H PRN, Kevan Ny Ditty,  MD .  senna [facility]) tablet 8.6 mg, 1 tablet, Oral, BID, Kevan Ny Ditty, MD, 8.6 mg at 02/25/16 2042 .  thyroid (ARMOUR) tablet 60 mg, 60 mg, Oral, QHS, Kevan Ny Ditty, MD, 60 mg at 02/25/16 2043 .  verapamil (CALAN) tablet 40 mg, 40 mg, Oral, 3 times per day, Ritta Slot, NP, 40 mg at 02/26/16 [id number]  Patients Current Diet: DIET - DYS 1 Room service appropriate?: Yes; Fluid consistency:: Thin Diet - low sodium heart healthy Cortrak for tube feeds continuous Poor appetite  Precautions / Restrictions Precautions Precautions: Fall Restrictions Weight Bearing Restrictions: No   Has the patient had 2 or more falls or a fall with injury in the past year?Yes  Prior Activity Level Pt independent and driving prior to initial fall. Loved walking her dog and working in her flowers. Always napped after lunch.  Home Assistive Devices / Herbalist and cane  Prior Device Use: Indicate devices/aids used by the patient prior to current illness, exacerbation or injury? Walker  And cane pta  Prior Functional Level Prior Function Level of Independence: Needs assistance Gait / Transfers Assistance Needed: Pt was ambulating short distances with Min A using RW on CIR.  Prior to admission 3/22, pt was mod I ADL's / Homemaking Assistance Needed: Pt required min - mod A on CIR.  Prior to initial fall was very independent  Communication / Swallowing Assistance Needed: ` Would use her RW outside when her knees bothered her for she had bilateral knee arthritis  Self Care: Did the patient need help bathing, dressing, using the toilet or eating?  Independent  Indoor Mobility: Did the patient need assistance with walking from room to room (with or without device)? Independent  Stairs: Did the patient need assistance with internal or external stairs (with or without device)? Independent  Functional Cognition: Did the patient need help planning regular tasks such as shopping or remembering  to take medications? Independent  Current Functional Level Cognition  Arousal/Alertness: Awake/alert Overall Cognitive Status: Impaired/Different from baseline Difficult to assess due to: Impaired communication Current Attention Level: Sustained Orientation Level: Other (comment) (UTA) Following Commands: Follows one step commands with increased time Safety/Judgement: Decreased awareness of safety, Decreased awareness of deficits General Comments: Pt attempting to progress with transfer with therapist attempting to transfer to chair not bed. pt needed max cue redirection. pt following commands demonstrating good receptive ability Attention: Focused, Sustained Focused Attention: Appears intact Sustained Attention: Impaired Sustained Attention Impairment: Verbal basic, Functional basic Memory:  (will assess in diagnostic treatment) Awareness: Impaired Awareness Impairment: Emergent impairment Behaviors: Restless, Impulsive Safety/Judgment: Impaired    Extremity Assessment (includes Sensation/Coordination)  Upper Extremity Assessment:  Defer to OT evaluation LUE Deficits / Details: Pt initially made no attempts to move Lt UE.  But when told to wipe her mouth, she attempted to reach for the washcloth and required mod A to wipe mouth.  Unable to perform a second time - appears apraxic.  Pt will attempt to flex fingers minimally   Lower Extremity Assessment: Generalized weakness, RLE deficits/detail RLE Deficits / Details: pt initially not actively moving LE, but while sitting EOB was noted to attempt to shift positioning of LE spontaneously.  Possible increased tone vs resisting movement.  Pain sensation intact. RLE Coordination: decreased fine motor, decreased gross motor    ADLs  Overall ADL's : Needs assistance/impaired Eating/Feeding: NPO Grooming: Wash/dry hands, Wash/dry face, Oral care, Brushing hair, Total assistance, Sitting Upper Body Bathing: Total assistance, Sitting Lower  Body Bathing: Total assistance, Bed level Upper Body Dressing : Total assistance, Sitting Lower Body Dressing: Moderate assistance, Bed level Lower Body Dressing Details (indicate cue type and reason): pt able to don socks with incr time and effort after OT started socks over toes Toilet Transfer: Minimal assistance, +2 for physical assistance, +2 for safety/equipment, Stand-pivot Toilet Transfer Details (indicate cue type and reason): pt needed cues for safety and hand placement. pt needed (A) to power up into standin Toileting- Water quality scientist and Hygiene: Total assistance, Sit to/from stand Toileting - Clothing Manipulation Details (indicate cue type and reason): total (A) for peri care and max cues to transfer R not left Functional mobility during ADLs: Maximal assistance, +2 for physical assistance (to EOB ) General ADL Comments: questions some R inattention based on decr visual attention to son on R side of bed and transfers x2 to the R side    Mobility  Overal bed mobility: Needs Assistance Bed Mobility: Supine to Sit Sidelying to sit: Mod assist Supine to sit: Min guard Sit to supine: Max assist, +2 for physical assistance General bed mobility comments: pt able with incr time to progress to EOB. pt static sitting EOB min guard (A)    Transfers  Overall transfer level: Needs assistance Equipment used: Rolling walker (2 wheeled) Transfer via Lift Equipment: Stedy Transfers: Sit to/from [employer] to Stand: Min assist, +2 physical assistance Stand pivot transfers: Min assist, +2 physical assistance General transfer comment: pt needed cues for safety    Ambulation / Gait / Stairs / Wheelchair Mobility  Ambulation/Gait General Gait Details:  After pivoting pt declined ambulating, letting out deep sigh when asked.    Posture / Balance Dynamic Sitting Balance Sitting balance - Comments: Needs UE support and unable to accept balance challenges without A.   Balance Overall  balance assessment: Needs assistance Sitting-balance support: Bilateral upper extremity supported, Feet supported Sitting balance-Leahy Scale: Fair Sitting balance - Comments: Needs UE support and unable to accept balance challenges without A.   Standing balance support: Bilateral upper extremity supported, During functional activity Standing balance-Leahy Scale: Poor Standing balance comment: Relies on RW and outside assist for support    Special needs/care consideration  Skin surgical incision with echymosis and staples                             Bowel mgmt: LBM 4/11 continent Bladder mgmt: incontinent Diabetic mgmt yes Bed and chair alarm due to decreased safety awarenesss Buttock right and left with some moisture  Areas with nursing applying barrier cream   Previous Home Environment Living Arrangements: Spouse/significant other, Children  Lives  With: Daughter, Spouse Available Help at Discharge: Family, Available 24 hours/day Type of Home: House Home Layout: One level Home Access: Stairs to enter Entrance Stairs-Rails: Left, Right, Can reach both Entrance Stairs-Number of Steps: 2 Bathroom Shower/Tub: Tub/shower unit, Multimedia programmer: Handicapped height Bathroom Accessibility: Yes How Accessible: Accessible via walker Home Care Services: No Additional Comments: daughter lives w/ pt and pt's husband.  Pt's husband mainly sits or stays in bed and is not very active, requiring assist from daughter for ADLs and ambulation.  Discharge Living Setting Plans for Discharge Living Setting: Patient's home, Lives with (comment) Type of Home at Discharge: House Discharge Home Layout: One level Discharge Home Access: Stairs to enter Entrance Stairs-Rails: Right, Left, Can reach both Entrance Stairs-Number of Steps: 2 Discharge Bathroom Shower/Tub: Tub/shower unit, Walk-in shower Discharge Bathroom Toilet: Handicapped height Discharge Bathroom Accessibility: Yes How  Accessible: Accessible via walker Does the patient have any problems obtaining your medications?: No  Social/Family/Support Systems Patient Roles: Spouse, Parent Contact Information: [name], daughter Anticipated Caregiver: daughter, [name] Anticipated Ambulance person Information: see above Ability/Limitations of Caregiver: daughter worked for 43 years for [employer] OR, either surgical tech or RM, states she has a bad back Caregiver Availability: 24/7 Discharge Plan Discussed with Primary Caregiver: Yes Is Caregiver In Agreement with Plan?: Yes Does Caregiver/Family have Issues with Lodging/Transportation while Pt is in Rehab?: No  Goals/Additional Needs Patient/Family Goal for Rehab: supervision to min assist with PT, OT and SLP Expected length of stay: 20-24 days Pt/Family Agrees to Admission and willing to participate: Yes Program Orientation Provided & Reviewed with Pt/Caregiver Including Roles  & Responsibilities: Yes  Decrease burden of Care through IP rehab admission: n/a  Possible need for SNF placement upon discharge:not anticipated  Patient Condition: The patient's medical and functional status has been reviewed by myself and the Rehabilitation Physician since her discharge to acute hospital for medical decline. Patient's condition is appropriate for intensive rehabilitative care in an inpatient rehabilitation facility. See history of present illness for medical update. [place] functionally is mod  Assist with max cuing for functional adls and mobility. Patient's medical and functional status have been discussed with the Rehabilitation physician and patient is appropriate for admission today.  Preadmission Screen Completed By:  Cleatrice Burke, 02/26/2016 10:52 AM ______________________________________________________________________   Discussed status with Dr. Posey Pronto on 02/26/2016 at 1053 and received telephone approval for admission today.  Admission Coordinator:  Cleatrice Burke, time [id number] Date 02/26/2016.

## 2016-02-26 NOTE — Progress Notes (Signed)
I have insurance approval for admission to inpt rehab today. Daughter, [name], at bedside and aware and in agreement. I contacted Dr. Cyndy Freeze and he is in agreement to dc to rehab today. RN CM made aware. I will make the arrangements to admit today. [id number]

## 2016-02-26 NOTE — Progress Notes (Signed)
I continue to await insurance approval to readmit pt to inpt rehab today. [id number]

## 2016-02-26 NOTE — Progress Notes (Signed)
No acute events Neuro stable To inpatient rehab today

## 2016-02-26 NOTE — Care Management Note (Signed)
Case Management Note  Patient Details  Name: Carolyn [name] MRN: [id number] Date of Birth: 08-18-1933  Subjective/Objective:   Patient is for discharge to CIR today.                  Action/Plan:   Expected Discharge Date:                  Expected Discharge Plan:  [place]  In-House Referral:     Discharge planning Services  CM Consult  Post Acute Care Choice:    Choice offered to:     DME Arranged:    DME Agency:     HH Arranged:    [place] Agency:     Status of Service:  Completed, signed off  Medicare Important Message Given:  Yes Date Medicare IM Given:    Medicare IM give by:    Date Additional Medicare IM Given:    Additional Medicare Important Message give by:     If discussed at [place] of Stay Meetings, dates discussed:    Additional Comments:  Zenon Mayo, RN 02/26/2016, 11:22 AM

## 2016-02-27 ENCOUNTER — Inpatient Hospital Stay (HOSPITAL_COMMUNITY): Payer: PPO | Admitting: Physical Therapy

## 2016-02-27 ENCOUNTER — Inpatient Hospital Stay (HOSPITAL_COMMUNITY): Payer: PPO

## 2016-02-27 ENCOUNTER — Inpatient Hospital Stay (HOSPITAL_COMMUNITY): Payer: PPO | Admitting: Speech Pathology

## 2016-02-27 DIAGNOSIS — R569 Unspecified convulsions: Secondary | ICD-10-CM | POA: Diagnosis not present

## 2016-02-27 DIAGNOSIS — F09 Unspecified mental disorder due to known physiological condition: Secondary | ICD-10-CM | POA: Diagnosis not present

## 2016-02-27 DIAGNOSIS — S065X2S Traumatic subdural hemorrhage with loss of consciousness of 31 minutes to 59 minutes, sequela: Secondary | ICD-10-CM

## 2016-02-27 DIAGNOSIS — N390 Urinary tract infection, site not specified: Secondary | ICD-10-CM | POA: Diagnosis not present

## 2016-02-27 LAB — GLUCOSE, CAPILLARY
Glucose-Capillary: 145 mg/dL — ABNORMAL HIGH (ref 65–99)
Glucose-Capillary: 154 mg/dL — ABNORMAL HIGH (ref 65–99)
Glucose-Capillary: 162 mg/dL — ABNORMAL HIGH (ref 65–99)
Glucose-Capillary: 119 mg/dL — ABNORMAL HIGH (ref 65–99)
Glucose-Capillary: 145 mg/dL — ABNORMAL HIGH (ref 65–99)
Glucose-Capillary: 160 mg/dL — ABNORMAL HIGH (ref 65–99)

## 2016-02-27 MED ORDER — GLUCERNA 1.2 CAL PO LIQD
1000.0000 mL | ORAL | Status: DC
  Administered 2016-02-27 – 2016-02-29 (×3): 1000 mL
  Filled 2016-02-27 (×11): qty 1000

## 2016-02-27 NOTE — Progress Notes (Addendum)
[facility] PHYSICAL MEDICINE & REHABILITATION     PROGRESS NOTE    Subjective/Complaints: No apparent issues overnight. Appears comfortable  ROS limited by aphasia and cognitive deficits.   Objective: Vital Signs: Blood pressure 143/87, pulse 74, temperature 98.3 F (36.8 C), temperature source Oral, resp. rate 18, height 5\' 6"  (1.676 m), weight 93.3 kg (205 lb 11 oz), last menstrual period 03/24/2010, SpO2 99 %. No results found.  Recent Labs  02/26/16 1642  WBC 8.5  HGB 10.5*  HCT 32.7*  PLT 218    Recent Labs  02/26/16 1642  NA 140  K 4.1  CL 102  GLUCOSE 84  BUN 10  CREATININE 0.53  CALCIUM 9.5   CBG (last 3)   Recent Labs  02/26/16 1644 02/26/16 1810 02/26/16 2109  GLUCAP 67 110* 182*    Wt Readings from Last 3 Encounters:  02/27/16 93.3 kg (205 lb 11 oz)  02/26/16 90.9 kg (200 lb 6.4 oz)  02/05/16 90.3 kg (199 lb 1.2 oz)    Physical Exam:  Constitutional: She appears well-developed and well-nourished.  HENT:  Craniotomy site clean and dry. Nasogastric tube in place  Eyes: Conjunctivae are normal.  Pupils reactive to light  Neck: Normal range of motion. Neck supple. No thyromegaly present.  Cardiovascular: Normal rate and regular rhythm.  Respiratory: Effort normal and breath sounds normal. No respiratory distress.  GI: Soft. Bowel sounds are normal. She exhibits no distension.  Musculoskeletal: She exhibits no edema or tenderness.  Neurological: She is alert.  Patient nonverbal DTRs symmetric Spontaneously moves rightt arm and leg. Is perseverative at times. Appears alert. Makes good eye contact  Skin: Skin is warm and dry.  Psychiatric:  flat  Assessment/Plan: 1. 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 Physiatrist is providing close team supervision and 24 hour management of active medical problems listed  below. Physiatrist and rehab team continue to assess barriers to discharge/monitor patient progress toward functional and medical goals.  Function:  Bathing Bathing position      Bathing parts      Bathing assist        Upper Body Dressing/Undressing Upper body dressing                    Upper body assist        Lower Body Dressing/Undressing Lower body dressing                                  Lower body assist        Toileting Toileting Toileting activity did not occur: No continent bowel/bladder event        Toileting assist     Transfers Chair/bed transfer             Locomotion Ambulation           Wheelchair          Cognition Comprehension Comprehension assist level: Understands basic less than 25% of the time/ requires cueing >75% of the time  Expression Expression assist level: Expresses basis less than 25% of the time/requires cueing >75% of the time.  Social Interaction Social Interaction assist level: Interacts appropriately 50 - 74% of the time - May be physically or verbally inappropriate.  Problem Solving Problem solving assist level: Solves basic 25 - 49% of the time - needs direction  more than half the time to initiate, plan or complete simple activities  Memory Memory assist level: Recognizes or recalls 25 - 49% of the time/requires cueing 50 - 75% of the time     Medical Problem List and Plan: 1. Right hemiparesthesias/aphasia/dysphasia secondary to traumatic subdural hematoma status post left craniotomy evacuated subdural hematoma with reaccumulation of fluid craniotomy 2  -begin CIR therapies today 2. DVT Prophylaxis/Anticoagulation: SCDs. Monitor for any signs of DVT 3. Pain Management: Tylenol as needed 4. Dysphagia. Dysphagia #1 thin liquids. Nasogastric tube remains in place. Monitor for any signs of aspiration. Follow-up speech therapy  -will need to initiate better to meet PO needs 5. Neuropsych: This  patient is not capable of making decisions on her own behalf. 6. Skin/Wound Care: Routine skin checks 7. Fluids/Electrolytes/Nutrition: Routine I&O with follow-up chemistries 8. Seizure disorder. Keppra 1250 mg every 12 hours.   -continue at current dose. Does experience sedation after dosing however.  9. Hypertension. Lisinopril 20 mg daily at bedtime, verapamil 40 mg 3 times a day,Lopressor 25 mg BID. Monitor with increased mobility 10. Diabetes mellitus. Hemoglobin A1c 6.7. Amaryl 4 mg daily at bedtime. Check blood sugars before meals and at bedtime  -follow for pattern 11. COPD. Continue Dulera and nebulizers as directed 12. UTI. Continue Rocephin for now base on +UA. i don't see that a urine culture was done----will order today 13. Hypothyroidism. Thyroid 60 mg daily at bedtime. Latest TSH 0.285  LOS (Days) 1 A FACE TO FACE EVALUATION WAS PERFORMED  Carolyn [name] T 02/27/2016 8:45 AM

## 2016-02-27 NOTE — Progress Notes (Signed)
Patient information reviewed and entered into eRehab system by Waynetta Metheny, RN, CRRN, PPS Coordinator.  Information including medical coding and functional independence measure will be reviewed and updated through discharge.     Per nursing patient was given "Data Collection Information Summary for Patients in Inpatient Rehabilitation Facilities with attached "Privacy Act Statement-Health Care Records" upon admission.  

## 2016-02-27 NOTE — Evaluation (Addendum)
Physical Therapy Assessment and Plan  Patient Details  Name: Carolyn [name] MRN: [id number] Date of Birth: 1933/05/24  PT Diagnosis: Abnormality of gait, Cognitive deficits, Difficulty walking, Hemiparesis dominant and Muscle weakness Rehab Potential: Good ELOS: 12-14 days   Today's Date: 02/27/2016 PT Individual Time: 1000-1100 PT Individual Time Calculation (min): 60 min    Problem List:  Patient Active Problem List   Diagnosis Date Noted  . Late effect of head trauma, cognitive deficits   . Dysphagia   . Seizures ([place])   . Acute lower UTI   . Altered mental status   . Atelectasis   . Spastic hemiplegia affecting right side 02/19/2016  . S/P craniotomy 02/17/2016  . Traumatic subdural hematoma ([place]) 02/12/2016  . Headache as late effect of brain injury ([place])   . Seizure ([place])   . Difficulty controlling behavior as late effect of traumatic brain injury ([place])   . 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 Memory dysfunction as late effect of traumatic brain injury ([place])   . Benign essential HTN   . DM type 2 with diabetic peripheral neuropathy ([place])   . Thyroid activity decreased   . TBI (traumatic brain injury) ([place])   . Cognitive deficit as late effect of traumatic brain injury ([place])   . Falls frequently   . Controlled type 2 diabetes mellitus with complication, without long-term current use of insulin ([place])   . Tachycardia   . Chronic obstructive pulmonary disease ([place])   . Subdural hematoma ([place]) 02/05/2016  . Chronic bronchitis ([place])   . SDH (subdural hematoma) ([place]) 01/20/2016  . Fall 01/20/2016  . Closed fracture of left elbow 01/20/2016  . Diabetes mellitus without complication ([place])   . Hypercholesterolemia   . COPD (chronic obstructive pulmonary disease) ([place])   . Hypothyroid   . Essential hypertension   . Inguinal hernia unilateral, non-recurrent, left 12/02/2011    Past Medical History:   Past Medical History  Diagnosis Date  . Diabetes mellitus   . Hypertension   . Hypercholesterolemia   . Gallstones   . Glaucoma   . Cataracts, bilateral   . COPD (chronic obstructive pulmonary disease) ([place])   . Hypothyroid   . PONV (postoperative nausea and vomiting)   . Asthma   . Chronic kidney disease 2007    kidney cancer and partial nephrctomy-right  . Subdural bleeding [facility])    Past Surgical History:  Past Surgical History  Procedure Laterality Date  . Dilation and curettage of uterus    . Partial nephrectomy      cancer  . Breast biopsy    . D&c for enlarged uterine  210  . Total abdominal hysterectomy w/ bilateral salpingoophorectomy  2011  . Cateract extractions and iol    . Cholecystectomy  2008  . Abdominal hysterectomy    . Eye surgery  08/2011,09/2012    bilateral cataracts  . Hernia repair  01/19/12    inguinal  . Inguinal hernia repair  01/19/2012    Procedure: HERNIA REPAIR INGUINAL ADULT;  Surgeon: Earnstine Regal, MD;  Location: [facility] ORS;  Service: General;  Laterality: Left;  repair left inguinal hernia with mesh   . Craniotomy Left 02/06/2016    Procedure: CRANIOTOMY HEMATOMA EVACUATION SUBDURAL;  Surgeon: Kevan Ny Ditty, MD;  Location: [facility] NEURO ORS;  Service: Neurosurgery;  Laterality: Left;  . Craniotomy N/A 02/17/2016    Procedure: CRANIOTOMY HEMATOMA EVACUATION SUBDURAL;  Surgeon: Kevan Ny  Ditty, MD;  Location: [place] NEURO ORS;  Service: Neurosurgery;  Laterality: N/A;  left  . Craniotomy Left 02/18/2016    Procedure: CRANIOTOMY HEMATOMA EVACUATION SUBDURAL;  Surgeon: Earnie Larsson, MD;  Location: [place] NEURO ORS;  Service: Neurosurgery;  Laterality: Left;    Assessment & Plan Carolyn [name] is a 82 y.o. right handed female with history of hypertension, diabetes mellitus, COPD. By chart review patient lives with husband and daughter in [place]. She used a walker prior to admission. One level home with 2 steps to entry. Daughter is an Therapist, sports  who is a full-time caregiver for both the patient and the patient's spouse. Patient with with history of subdural hematoma after multiple falls beginning in March 2017 as well as left radial head fracture. Family notes patient with persistent headaches. Patient with recent simple partial seizure and presented to the emergency department 02/06/2016. Cranial CT scan revealed enlarging chronic subdural hematoma. Underwent left craniotomy for evacuation of subdural hematoma 02/06/2016 per Dr. Cyndy Freeze. Maintained on Keppra for suspect seizure. Tolerating a regular consistency diet. Repeat head CT on 3/26 suggesting increased pain midline shift. PhysicalAnd occupational therapy evaluations completed and ongoing. M.D. has requested physical medicine rehabilitation consult.Patient was admitted for comprehensive rehabilitation program 02/12/2016. Patient was slow progress one admission to rehabilitation services. Persistent neurogenic headaches with trials of Topamax discontinued due to altered mental status and appeared to be more arousable after medication adjustments. Patient remained on Keppra as advise. A cranial CT scan completed 02/14/2016 due to some increasing right-sided weakness and altered mental status showed evolutionary changes within the subdural hematoma decreasing pneumocephalus and stable since prior tracings of monitoring of left to right midline shift. Findings mild hypokalemia 3.2 supplement added. A urine study showed no growth. Appetite somewhat diminishing was maintained on gentle IV fluids for hydration. Cranial CT scan again completed 02/17/2016 due to waxing and waning of mental status again unchanged size of residual mixed densities left subdural hematoma 6 mm rightward midline shift again unchanged. Neurosurgery contacted and felt with decline neurologically and left frontal subdural fluid collection still causing mass effect that patient should return back to the OR. She was discharged to acute  care services 02/17/2016 and underwent re-entry left craniotomy for evacuation of subdural hematoma per Dr. Cyndy Freeze. Postoperatively continued to show neurological decline becoming less verbal no longer moving her right upper extremity. Follow-up CT demonstrated reaccumulation of left convex to the subdural hematoma with significant mass effect. She again returned to the OR for evacuation of residual chronic subdural hematoma. Repeat EEG 02/21/2016 suggestive of possible epileptogenic potential in the left frontoparietal region with superimposed mild encephalopathy. Her Keppra was adjusted to 1250 mg every 12 hours per neurology services. Diet slowly advanced to a dysphagia #1 thin liquid diet with nasogastric tube remaining in place for nutritional support. Follow-up urine study 02/24/2016 positive nitrite and large leukocytes cultures and sensitivities pending placed on empiric Rocephin. Physical therapy resumed with recommendations to resume inpatient rehabilitation services. Patient was admitted for comprehensive rehabilitation program to resume comprehensive therapies. Patient transferred to CIR on 02/26/2016.   Patient currently requires mod with mobility secondary to muscle weakness, muscle joint tightness and muscle paralysis, decreased cardiorespiratoy endurance, impaired timing and sequencing and unbalanced muscle activation, decreased attention, decreased awareness, decreased problem solving, decreased memory and delayed processing and decreased standing balance, hemiplegia and decreased balance strategies.  Prior to hospitalization, patient was modified independent  with mobility and lived with Daughter, Spouse in a House home.  Home  access is 2Stairs to enter.  Patient will benefit from skilled PT intervention to maximize safe functional mobility, minimize fall risk and decrease caregiver burden for planned discharge home with 24 hour supervision.  Anticipate patient will benefit from follow up [place] at  discharge.  PT - End of Session Activity Tolerance: Decreased this session;Tolerates 10 - 20 min activity with multiple rests Endurance Deficit: Yes PT Assessment Rehab Potential (ACUTE/IP ONLY): Good PT Patient demonstrates impairments in the following area(s): Balance;Edema;Endurance;Motor;Pain;Safety PT Transfers Functional Problem(s): Bed Mobility;Bed to Chair;Car;Furniture PT Locomotion Functional Problem(s): Ambulation;Wheelchair Mobility;Stairs PT Plan PT Intensity: Minimum of 1-2 x/day ,45 to 90 minutes PT Frequency: 5 out of 7 days PT Duration Estimated Length of Stay: 12-14 days PT Treatment/Interventions: Ambulation/gait training;Balance/vestibular training;Cognitive remediation/compensation;Community reintegration;Discharge planning;Disease management/prevention;DME/adaptive equipment instruction;Functional mobility training;Neuromuscular re-education;Pain management;Patient/family education;Psychosocial support;Stair training;Therapeutic Activities;Therapeutic Exercise;UE/LE Strength taining/ROM;UE/LE Coordination activities;Wheelchair propulsion/positioning PT Transfers Anticipated Outcome(s): supervision PT Locomotion Anticipated Outcome(s): supervision PT Recommendation Follow Up Recommendations: Home health PT;24 hour supervision/assistance Patient destination: Home Equipment Recommended: To be determined  Skilled Therapeutic Intervention Skilled therapeutic intervention initiated after completion of evaluation. Discussed with patient falls risk, safety within room, and focus of therapy during stay. Discussed possible length of stay, goals, and follow-up therapy. Patient currently min-mod A overall using RW including gait up 20 ft with cues for safe use of RW and hand placement with transfers. Patient known to this PT from previous CIR stay, limited by expressive aphasia during evaluation but able to respond to basic needs somewhat automatically. Patient toileted at end of  session and left sitting in wheelchair with needs in reach to await OT session.  PT Evaluation Precautions/Restrictions Precautions Precautions: Fall Precaution Comments: fall, expressive aphasia, right hemiparesis Restrictions Weight Bearing Restrictions: No General Chart Reviewed: Yes Family/Caregiver Present: No Vital SignsTherapy Vitals Pulse Rate: 83 BP: (!) 154/82 mmHg Pain Pain Assessment Pain Assessment: No/denies pain Home Living/Prior Functioning Home Living Available Help at Discharge: Family;Available 24 hours/day Type of Home: House Home Access: Stairs to enter [employer] of Steps: 2 Entrance Stairs-Rails: Left;Right;Can reach both Home Layout: One level Bathroom Shower/Tub: Tub/shower unit;Walk-in shower Bathroom Toilet: Handicapped height Bathroom Accessibility: Yes Additional Comments: daughter lives w/ pt and pt's husband.  Pt's husband mainly sits or stays in bed and is not very active, requiring assist from daughter for ADLs and ambulation.  Lives With: Daughter;Spouse Prior Function Level of Independence: Independent with basic ADLs;Independent with gait;Independent with homemaking with ambulation;Independent with transfers;Requires assistive device for independence  Able to Take Stairs?: No Driving: Yes Leisure: Hobbies-yes (Comment) Comments: gardening, her dog Vision/Perception  Vision - Assessment Eye Alignment: Within Functional Limits Tracking/Visual Pursuits: Able to track stimulus in all quads without difficulty  Cognition Overall Cognitive Status: Impaired/Different from baseline Arousal/Alertness: Awake/alert Focused Attention: Appears intact Sustained Attention: Impaired Sustained Attention Impairment: Functional basic;Functional complex;Verbal basic Awareness: Impaired Awareness Impairment: Emergent impairment Problem Solving: Impaired Problem Solving Impairment: Functional basic Safety/Judgment: Impaired [employer]  Scales of Cognitive Functioning: Confused/appropriate Sensation Sensation Light Touch: Impaired Detail Light Touch Impaired Details: Impaired RUE Proprioception: Impaired Detail Proprioception Impaired Details: Impaired RUE Additional Comments: right hand can drop things in ADL tasks with decr awareness Coordination Gross Motor Movements are Fluid and Coordinated: No Fine Motor Movements are Fluid and Coordinated: No Motor  Motor Motor: Hemiplegia  Mobility Transfers Transfers: Yes Sit to Stand: 3: Mod assist Stand to Sit: 4: Min assist Stand Pivot Transfers: 4: Min assist;With armrests Locomotion  Ambulation Ambulation: Yes Ambulation/Gait Assistance: 4: Min assist  Ambulation Distance (Feet): 20 Feet Assistive device: Rolling walker Gait Gait: Yes Gait Pattern: Impaired Gait Pattern: Decreased stride length;Trunk flexed;Lateral trunk lean to right;Lateral trunk lean to left Gait velocity: decreased Stairs / Additional Locomotion Stairs: Yes Stairs Assistance: 4: Min assist Stair Management Technique: Two rails;Step to pattern;Forwards Number of Stairs: 8 Height of Stairs: 3 Wheelchair Mobility Wheelchair Mobility: No  Trunk/Postural Assessment  Cervical Assessment Cervical Assessment: Within Functional Limits Thoracic Assessment Thoracic Assessment: Exceptions to [facility] (forward flexed) Lumbar Assessment Lumbar Assessment: Exceptions to [facility] (posterior pelvic tilt) Postural Control Postural Control: Deficits on evaluation Protective Responses: delayed  Balance Balance Balance Assessed: Yes Static Sitting Balance Static Sitting - Level of Assistance: 5: Stand by assistance Dynamic Sitting Balance Dynamic Sitting - Level of Assistance: 4: Min assist Static Standing Balance Static Standing - Level of Assistance: 3: Mod assist;4: Min assist Dynamic Standing Balance Dynamic Standing - Level of Assistance: 3: Mod assist Extremity Assessment  RUE Assessment RUE  Assessment: Exceptions to [facility] RUE AROM (degrees) RUE Overall AROM Comments: 0-90 degrees shoulder flexion; distally appears WFL RUE Strength RUE Overall Strength Comments: overall 3/5 RUE Tone RUE Tone: Within Functional Limits LUE Assessment LUE Assessment: Exceptions to [facility] LUE Strength LUE Overall Strength Comments: has had a previous fx to left radial head - has a splint will continue to look into splint schedule with MD; overall strength 3/5 LUE Tone LUE Tone: Within Functional Limits RLE Assessment RLE Assessment: Exceptions to [facility] RLE Strength RLE Overall Strength: Deficits RLE Overall Strength Comments: At least 3+/5 throughout except hip flexion 3-/5 but UTA MMT due to difficulty following commands LLE Assessment LLE Assessment: Exceptions to [facility] LLE Strength LLE Overall Strength: Deficits LLE Overall Strength Comments: At least 3+/5 throughout except hip flexion 3-/5 but UTA MMT due to difficulty following commands   See Function Navigator for Current Functional Status.   Refer to Care Plan for Long Term Goals  Recommendations for other services: None  Discharge Criteria: Patient will be discharged from PT if patient refuses treatment 3 consecutive times without medical reason, if treatment goals not met, if there is a change in medical status, if patient makes no progress towards goals or if patient is discharged from hospital.  The above assessment, treatment plan, treatment alternatives and goals were discussed and mutually agreed upon: by patient  Laretta Alstrom 02/27/2016, 10:32 AM

## 2016-02-27 NOTE — Progress Notes (Signed)
Occupational Therapy Note  Patient Details  Name: Carolyn [name] MRN: [id number] Date of Birth: 23-Jun-1933  Today's Date: 02/27/2016 OT Individual Time: 1300-1315 OT Individual Time Calculation (min): 15 min  and Today's Date: 02/27/2016 OT Missed Time: 30 Minutes Missed Time Reason: Patient fatigue  Pt asleep with no s/s of pain Individual Therapy  Pt asleep in bed upon arrival with daughter present. Attempted to arouse patient with multimodal cue.  Discussed morning therapy sessions with daughter and current functional level.  Requested daughter bring in clothing for patient.  Discussed reduction in daily schedule from 4.5 hours to 3.5 hours.  Daughter in agreement with decision.    Leotis Shames [facility] 02/27/2016, 1:30 PM

## 2016-02-27 NOTE — Progress Notes (Signed)
No acute events Dysphasia improving Right hemiparesis mostly resolved, able to use right arm for many tasks Wound looks good Stable and improving

## 2016-02-27 NOTE — Progress Notes (Signed)
Initial Nutrition Assessment  DOCUMENTATION CODES:   Obesity unspecified  INTERVENTION:  Discontinue Jevity 1.2 formula.   Initiate nocturnal tube feeds of Glucerna 1.2 formula via Cortrak NGT at goal rate of 70 ml/hr x 14 hours (1700-0700) daily to provide 1176 kcal (74% of needs), 59 grams of protein, and 794 ml of free water.   Continue Ensure Enlive po BID, each supplement provides 350 kcal and 20 grams of protein.  Encourage adequate PO intake.   NUTRITION DIAGNOSIS:   Inadequate oral intake related to poor appetite as evidenced by meal completion < 50%.  GOAL:   Patient will meet greater than or equal to 90% of their needs  MONITOR:   PO intake, Supplement acceptance, Diet advancement, Weight trends, Labs, I & O's  REASON FOR ASSESSMENT:   Consult Enteral/tube feeding initiation and management  ASSESSMENT:   Carolyn [name] is a 82 y.o. right handed female with history of hypertension, diabetes mellitus, COPD. Patient with with history of subdural hematoma after multiple falls beginning in March 2017 as well as left radial head fracture. Presents with Traumatic Subdural Hematoma  Pt aphasic however able to nod or shake her head to questions asked. No family at bedside. Pt reports having a decreased appetite. No abdominal pains reported. Per Epic weight records, pt with no significant weight loss. Meal completion has been poor at 5-25%. Pt currently on a dysphagia 1 diet and receiving nocturnal tube feeds. Noted CBG's have been elevated at night, RD to switch formula to Glucerna 1.2 instead. Noted pt has Ensure ordered and has been consuming them. RD to continue with current orders.   Pt with no observed significant fat or muscle mass loss.   Labs and medications reviewed.   Diet Order:  DIET - DYS 1 Room service appropriate?: Yes; Fluid consistency:: Thin  Skin:   (Incision on L head)  Last BM:  4/12  Height:   Ht Readings from Last 1 Encounters:  02/26/16 5'  6" (1.676 m)    Weight:   Wt Readings from Last 1 Encounters:  02/27/16 205 lb 11 oz (93.3 kg)    Ideal Body Weight:  59 kg  BMI:  Body mass index is 33.21 kg/(m^2).  Estimated Nutritional Needs:   Kcal:  1600-1800  Protein:  80-90 grams  Fluid:  1.6 - 1.8 L/day  EDUCATION NEEDS:   No education needs identified at this time  Corrin Parker, MS, RD, LDN Pager # [phone] After hours/ weekend pager # [phone]

## 2016-02-27 NOTE — Evaluation (Signed)
Occupational Therapy Assessment and Plan  Patient Details  Name: Carolyn [name] MRN: [id number] Date of Birth: 21-Jul-1933  OT Diagnosis: acute pain, cognitive deficits and hemiplegia affecting dominant side Rehab Potential: Rehab Potential (ACUTE ONLY): Good ELOS: 2 weeks   Today's Date: 02/27/2016 OT Individual Time: 0800-0910 OT Individual Time Calculation (min): 70 min     Problem List:  Patient Active Problem List   Diagnosis Date Noted  . Late effect of head trauma, cognitive deficits   . Dysphagia   . Seizures ([place])   . Acute lower UTI   . Altered mental status   . Atelectasis   . Spastic hemiplegia affecting right side 02/19/2016  . S/P craniotomy 02/17/2016  . Traumatic subdural hematoma ([place]) 02/12/2016  . Headache as late effect of brain injury ([place])   . Seizure ([place])   . Difficulty controlling behavior as late effect of traumatic brain injury ([place])   . 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 Memory dysfunction as late effect of traumatic brain injury ([place])   . Benign essential HTN   . DM type 2 with diabetic peripheral neuropathy ([place])   . Thyroid activity decreased   . TBI (traumatic brain injury) ([place])   . Cognitive deficit as late effect of traumatic brain injury ([place])   . Falls frequently   . Controlled type 2 diabetes mellitus with complication, without long-term current use of insulin ([place])   . Tachycardia   . Chronic obstructive pulmonary disease ([place])   . Subdural hematoma ([place]) 02/05/2016  . Chronic bronchitis ([place])   . SDH (subdural hematoma) ([place]) 01/20/2016  . Fall 01/20/2016  . Closed fracture of left elbow 01/20/2016  . Diabetes mellitus without complication ([place])   . Hypercholesterolemia   . COPD (chronic obstructive pulmonary disease) ([place])   . Hypothyroid   . Essential hypertension   . Inguinal hernia unilateral, non-recurrent, left 12/02/2011    Past Medical History:   Past Medical History  Diagnosis Date  . Diabetes mellitus   . Hypertension   . Hypercholesterolemia   . Gallstones   . Glaucoma   . Cataracts, bilateral   . COPD (chronic obstructive pulmonary disease) ([place])   . Hypothyroid   . PONV (postoperative nausea and vomiting)   . Asthma   . Chronic kidney disease 2007    kidney cancer and partial nephrctomy-right  . Subdural bleeding [facility])    Past Surgical History:  Past Surgical History  Procedure Laterality Date  . Dilation and curettage of uterus    . Partial nephrectomy      cancer  . Breast biopsy    . D&c for enlarged uterine  210  . Total abdominal hysterectomy w/ bilateral salpingoophorectomy  2011  . Cateract extractions and iol    . Cholecystectomy  2008  . Abdominal hysterectomy    . Eye surgery  08/2011,09/2012    bilateral cataracts  . Hernia repair  01/19/12    inguinal  . Inguinal hernia repair  01/19/2012    Procedure: HERNIA REPAIR INGUINAL ADULT;  Surgeon: Earnstine Regal, MD;  Location: [facility] ORS;  Service: General;  Laterality: Left;  repair left inguinal hernia with mesh   . Craniotomy Left 02/06/2016    Procedure: CRANIOTOMY HEMATOMA EVACUATION SUBDURAL;  Surgeon: Kevan Ny Ditty, MD;  Location: [facility]URO ORS;  Service: Neurosurgery;  Laterality: Left;  . Craniotomy N/A 02/17/2016    Procedure: CRANIOTOMY HEMATOMA EVACUATION SUBDURAL;  Surgeon:  Kevan Ny Ditty, MD;  Location: [place] NEURO ORS;  Service: Neurosurgery;  Laterality: N/A;  left  . Craniotomy Left 02/18/2016    Procedure: CRANIOTOMY HEMATOMA EVACUATION SUBDURAL;  Surgeon: Earnie Larsson, MD;  Location: [place] [facility]UR[facility] ORS;  Service: Neurosurgery;  Laterality: Left;    Assessment & Plan Clinical Impression: Patient is a 82 y.o. year old female right handed female with history of hypertension, diabetes mellitus, COPD. By chart review patient lives with husband and daughter in [place]. She used a walker prior to admission. One level home with 2 steps  to entry. Daughter is an Therapist, sports who is a full-time caregiver for both the patient and the patient's spouse. Patient with with history of subdural hematoma after multiple falls beginning in March 2017 as well as left radial head fracture. Family notes patient with persistent headaches. Patient with recent simple partial seizure and presented to the emergency department 02/06/2016. Cranial CT scan revealed enlarging chronic subdural hematoma. Underwent left craniotomy for evacuation of subdural hematoma 02/06/2016 per Dr. Cyndy Freeze. Maintained on Keppra for suspect seizure. Tolerating a regular consistency diet. Repeat head CT on 3/26 suggesting increased pain midline shift. PhysicalAnd occupational therapy evaluations completed and ongoing. M.D. has requested physical medicine rehabilitation consult.Patient was admitted for comprehensive rehabilitation program 02/12/2016. Patient was slow progress one admission to rehabilitation services. Persistent neurogenic headaches with trials of Topamax discontinued due to altered mental status and appeared to be more arousable after medication adjustments. Patient remained on Keppra as advise. A cranial CT scan completed 02/14/2016 due to some increasing right-sided weakness and altered mental status showed evolutionary changes within the subdural hematoma decreasing pneumocephalus and stable since prior tracings of monitoring of left to right midline shift. Findings mild hypokalemia 3.2 supplement added. A urine study showed no growth. Appetite somewhat diminishing was maintained on gentle IV fluids for hydration. Cranial CT scan again completed 02/17/2016 due to waxing and waning of mental status again unchanged size of residual mixed densities left subdural hematoma 6 mm rightward midline shift again unchanged. Neurosurgery contacted and felt with decline neurologically and left frontal subdural fluid collection still causing mass effect that patient should return back to the OR.  She was discharged to acute care services 02/17/2016 and underwent re-entry left craniotomy for evacuation of subdural hematoma per Dr. Cyndy Freeze. Postoperatively continued to show neurological decline becoming less verbal no longer moving her right upper extremity. Follow-up CT demonstrated reaccumulation of left convex to the subdural hematoma with significant mass effect. She again returned to the OR for evacuation of residual chronic subdural hematoma. Repeat EEG 02/21/2016 suggestive of possible epileptogenic potential in the left frontoparietal region with superimposed mild encephalopathy. Her Keppra was adjusted to 1250 mg every 12 hours per neurology services. Diet slowly advanced to a dysphagia #1 thin liquid diet with nasogastric tube remaining in place for nutritional support. Follow-up urine study 02/24/2016 positive nitrite and large leukocytes cultures and sensitivities pending placed on empiric Rocephin. Patient transferred to CIR on 02/26/2016 .    Patient currently requires mod with basic self-care skills and mod A for basic mobility secondary to muscle weakness, decreased cardiorespiratoy endurance, impaired timing and sequencing and decreased coordination, decreased attention to right and decreased motor planning, decreased initiation, decreased attention, decreased awareness, decreased problem solving, decreased safety awareness and delayed processing and decreased standing balance, decreased postural control, hemiplegia and decreased balance strategies.  Prior to hospitalization, patient could complete ADL with modified independent .  Patient will benefit from skilled intervention to decrease  level of assist with basic self-care skills and increase independence with basic self-care skills prior to discharge home with care partner.  Anticipate patient will require 24 hour supervision and follow up home health.  OT - End of Session Activity Tolerance: Tolerates 30+ min activity with multiple  rests Endurance Deficit: Yes OT Assessment Rehab Potential (ACUTE ONLY): Good Barriers to Discharge: Decreased caregiver support OT Patient demonstrates impairments in the following area(s): Balance;Cognition;Edema;Endurance;Motor;Perception;Pain;Safety;Sensory OT Basic ADL's Functional Problem(s): Eating;Bathing;Grooming;Dressing;Toileting OT Transfers Functional Problem(s): Toilet;Tub/Shower OT Additional Impairment(s): Fuctional Use of Upper Extremity OT Plan OT Intensity: Minimum of 1-2 x/day, 45 to 90 minutes OT Frequency: 5 out of 7 days OT Duration/Estimated Length of Stay: 2 weeks OT Treatment/Interventions: Balance/vestibular training;Cognitive remediation/compensation;Community reintegration;Discharge planning;DME/adaptive equipment instruction;Disease mangement/prevention;Functional electrical stimulation;Functional mobility training;Neuromuscular re-education;Pain management;Psychosocial support;Patient/family education;Self Care/advanced ADL retraining;Splinting/orthotics;Therapeutic Exercise;UE/LE Coordination activities;UE/LE Strength taining/ROM;Therapeutic Activities;Skin care/wound managment OT Self Feeding Anticipated Outcome(s): supervision OT Basic Self-Care Anticipated Outcome(s): supervision OT Toileting Anticipated Outcome(s): supervision OT Bathroom Transfers Anticipated Outcome(s): supervision OT Recommendation Patient destination: Home Follow Up Recommendations: Home health OT Equipment Recommended: To be determined   Skilled Therapeutic Intervention 1:1 Ot eval initiated with OT goals, purpose and role discussed. Pt known to this therapist. Pt now presents with expressive aphasia impacting her ability to communicate which she does indicate is frustrating to her. Self care retraining including bed mobility, stand pivot transfers, sit to stands, toileting and toilet transfers and bathing at shower level. Pt with decr coordination and preperception of right hand  noted with tools including washcloth with decr awareness of dropping items. Pt with no clothing to don other than hospital gown so difficult to fully assess LB dressing. Pt declined eating breakfast even with 2 different opportunities to eat. Pt transferred back into bed with mod A and mod A to return to supine to rest before next therapy.   OT Evaluation Precautions/Restrictions  Precautions Precautions: Fall Precaution Comments: fall, expressive aphasia, right hemiparesis Restrictions Weight Bearing Restrictions: No General Chart Reviewed: Yes Family/Caregiver Present: No Vital Signs  Pain   Home Living/Prior Functioning Home Living Available Help at Discharge: Family, Available 24 hours/day Type of Home: House Home Access: Stairs to enter [employer] of Steps: 2 Entrance Stairs-Rails: Left, Right, Can reach both Home Layout: One level Bathroom Shower/Tub: Tub/shower unit, Multimedia programmer: Handicapped height Bathroom Accessibility: Yes Additional Comments: daughter lives w/ pt and pt's husband.  Pt's husband mainly sits or stays in bed and is not very active, requiring assist from daughter for ADLs and ambulation.  Lives With: Daughter, Spouse ADL ADL ADL Comments: see functional navigator Vision/Perception  Vision- History Baseline Vision/History: Wears glasses;Cataracts Wears Glasses: At all times (however didnt have them on eval) Vision- Assessment Vision Assessment?: Yes Eye Alignment: Within Functional Limits Tracking/Visual Pursuits: Able to track stimulus in all quads without difficulty  Cognition Overall Cognitive Status: Impaired/Different from baseline Arousal/Alertness: Awake/alert Orientation Level: Person;Place;Situation Person: Oriented Place: Oriented Situation: Oriented Year:  (unable to perform due to aphasia and cognition ) Focused Attention: Appears intact Sustained Attention: Impaired Sustained Attention Impairment:  Functional basic;Functional complex;Verbal basic Awareness: Impaired Awareness Impairment: Emergent impairment Problem Solving: Impaired Problem Solving Impairment: Functional basic Safety/Judgment: Impaired Rancho Los Amigos Scales of Cognitive Functioning: Confused/appropriate Sensation Sensation Light Touch: Impaired Detail Light Touch Impaired Details: Impaired RUE Proprioception: Impaired Detail Proprioception Impaired Details: Impaired RUE Additional Comments: right hand can drop things in ADL tasks with decr awareness Coordination Gross Motor Movements are Fluid and Coordinated: No Fine Motor Movements  are Fluid and Coordinated: No Motor  Motor Motor: Hemiplegia Mobility  Transfers Transfers: Sit to Stand;Stand to Sit Sit to Stand: 3: Mod assist Stand to Sit: 3: Mod assist  Trunk/Postural Assessment  Cervical Assessment Cervical Assessment: Within Functional Limits Thoracic Assessment Thoracic Assessment: Exceptions to [facility] (forward flexed) Lumbar Assessment Lumbar Assessment: Exceptions to [facility] (posterior pelvic tilt) Postural Control Postural Control: Deficits on evaluation Protective Responses: delayed  Balance Balance Balance Assessed: Yes Static Sitting Balance Static Sitting - Level of Assistance: 5: Stand by assistance Dynamic Sitting Balance Dynamic Sitting - Level of Assistance: 4: Min assist Static Standing Balance Static Standing - Level of Assistance: 3: Mod assist;4: Min assist Dynamic Standing Balance Dynamic Standing - Level of Assistance: 3: Mod assist Extremity/Trunk Assessment RUE Assessment RUE Assessment: Exceptions to [facility] RUE AROM (degrees) RUE Overall AROM Comments: 0-90 degrees shoulder flexion; distally appears WFL RUE Strength RUE Overall Strength Comments: overall 3/5 RUE Tone RUE Tone: Within Functional Limits LUE Assessment LUE Assessment: Exceptions to [facility] LUE Strength LUE Overall Strength Comments: has had a previous fx to  left radial head - has a splint will continue to look into splint schedule with MD; overall strength 3/5 LUE Tone LUE Tone: Within Functional Limits   See Function Navigator for Current Functional Status.   Refer to Care Plan for Long Term Goals  Recommendations for other services: None  Discharge Criteria: Patient will be discharged from OT if patient refuses treatment 3 consecutive times without medical reason, if treatment goals not met, if there is a change in medical status, if patient makes no progress towards goals or if patient is discharged from hospital.  The above assessment, treatment plan, treatment alternatives and goals were discussed and mutually agreed upon: by patient  Nicoletta Ba 02/27/2016, 9:46 AM

## 2016-02-27 NOTE — Plan of Care (Signed)
Pt's plan of care adjusted to 3.5 hours daily from 4.5 hours daily after speaking with care team as pt currently unable to tolerate current therapy schedule with OT, PT, and SLP.

## 2016-02-27 NOTE — Progress Notes (Signed)
Occupational Therapy Note  Patient Details  Name: Carolyn [name] MRN: [id number] Date of Birth: 1932-12-01  Today's Date: 02/27/2016 OT Individual Time: 1115-1200 OT Individual Time Calculation (min): 45 min   Pt denied pain Individual Therapy  Pt resting in w/c upon arrival.  Pt is currently nonverbal but communicated with gestures that she wanted to brush her teeth and wash her face. Pt performed tasks with supervison/setup.  Pt required more than a reasonable amount of time to complete task and was very thorough with brushing.  Pt exhibited behaviors of concern when she saw her reflection in the mirror in regards to staples and bandages, as well as where her hair had been shaved.  Pt communicated that she wanted to return to bed and performed a stand pivot transfer to bed with mod A.  Pt required min multimodal cues for sequencing during the transfer.  Pt remained in bed with all needs within reach.    Leotis Shames [facility] 02/27/2016, 12:23 PM

## 2016-02-27 NOTE — Evaluation (Signed)
Speech Language Pathology Assessment and Plan  Patient Details  Name: Carolyn [name] MRN: [id number] Date of Birth: Jun 11, 1933  SLP Diagnosis: Aphasia;Dysarthria;Cognitive Impairments;Dysphagia  Rehab Potential: Fair ELOS: 12-14 days    Today's Date: 02/27/2016 SLP Individual Time: 4627-0350 SLP Individual Time Calculation (min): 60 min   Problem List:  Patient Active Problem List   Diagnosis Date Noted  . Late effect of head trauma, cognitive deficits   . Dysphagia   . Seizures ([place])   . Acute lower UTI   . Altered mental status   . Atelectasis   . Spastic hemiplegia affecting right side 02/19/2016  . S/P craniotomy 02/17/2016  . Traumatic subdural hematoma ([place]) 02/12/2016  . Headache as late effect of brain injury ([place])   . Seizure ([place])   . Difficulty controlling behavior as late effect of traumatic brain injury ([place])   . Major neurocognitive disorder as late effect of traumatic brain injury with behavioral disturbance ([place])   . Memory dysfunction as late effect of traumatic brain injury ([place])   . Benign essential HTN   . DM type 2 with diabetic peripheral neuropathy ([place])   . Thyroid activity decreased   . TBI (traumatic brain injury) ([place])   . Cognitive deficit as late effect of traumatic brain injury ([place])   . Falls frequently   . Controlled type 2 diabetes mellitus with complication, without long-term current use of insulin ([place])   . Tachycardia   . Chronic obstructive pulmonary disease ([place])   . Subdural hematoma ([place]) 02/05/2016  . Chronic bronchitis ([place])   . SDH (subdural hematoma) ([place]) 01/20/2016  . Fall 01/20/2016  . Closed fracture of left elbow 01/20/2016  . Diabetes mellitus without complication ([place])   . Hypercholesterolemia   . COPD (chronic obstructive pulmonary disease) ([place])   . Hypothyroid   . Essential hypertension   . Inguinal hernia unilateral, non-recurrent, left 12/02/2011   Past Medical History:  Past Medical History  Diagnosis Date   . Diabetes mellitus   . Hypertension   . Hypercholesterolemia   . Gallstones   . Glaucoma   . Cataracts, bilateral   . COPD (chronic obstructive pulmonary disease) ([place])   . Hypothyroid   . PONV (postoperative nausea and vomiting)   . Asthma   . Chronic kidney disease 2007    kidney cancer and partial nephrctomy-right  . Subdural bleeding [facility])    Past Surgical History:  Past Surgical History  Procedure Laterality Date  . Dilation and curettage of uterus    . Partial nephrectomy      cancer  . Breast biopsy    . D&c for enlarged uterine  210  . Total abdominal hysterectomy w/ bilateral salpingoophorectomy  2011  . Cateract extractions and iol    . Cholecystectomy  2008  . Abdominal hysterectomy    . Eye surgery  08/2011,09/2012    bilateral cataracts  . Hernia repair  01/19/12    inguinal  . Inguinal hernia repair  01/19/2012    Procedure: HERNIA REPAIR INGUINAL ADULT;  Surgeon: Earnstine Regal, MD;  Location: [facility] ORS;  Service: General;  Laterality: Left;  repair left inguinal hernia with mesh   . Craniotomy Left 02/06/2016    Procedure: CRANIOTOMY HEMATOMA EVACUATION SUBDURAL;  Surgeon: Kevan Ny Ditty, MD;  Location: [facility] NEURO ORS;  Service: Neurosurgery;  Laterality: Left;  . Craniotomy N/A 02/17/2016    Procedure: CRANIOTOMY HEMATOMA EVACUATION SUBDURAL;  Surgeon: Kevan Ny Ditty, MD;  Location: [facility] NEURO ORS;  Service:  Neurosurgery;  Laterality: N/A;  left  . Craniotomy Left 02/18/2016    Procedure: CRANIOTOMY HEMATOMA EVACUATION SUBDURAL;  Surgeon: Earnie Larsson, MD;  Location: [place] [facility] NEURO ORS;  Service: Neurosurgery;  Laterality: Left;    Assessment / Plan / Recommendation Clinical Impression Patient is an 82 y.o. year old right handed female with history of hypertension, diabetes mellitus, COPD. Daughter is an Therapist, sports who is a full-time caregiver for both the patient and the patient's spouse. Patient with history of subdural hematoma after multiple falls beginning in March 2017  as well as left radial head fracture. Family notes patient with persistent headaches. Patient with recent simple partial seizure and presented to the emergency department 02/06/2016. Cranial CT scan revealed enlarging chronic subdural hematoma. Underwent left craniotomy for evacuation of subdural hematoma 02/06/2016 per Dr. Cyndy Freeze. Pt maintained on Keppra for suspect seizure. At that time pt tolerated a regular consistency diet. Repeat head CT on 3/26 suggesting increased pain & midline shift.  Patient transferred to CIR on 02/12/2016. Pt underwent second craniotomy on 02/18/2016 for evacuation of chronic SDH parieto-occipital subdural hematoma. Pt has been receiving SLP intervention while in house, focusing on comprehension, expression, social interaction, problem solving and memory. Pt received a Modified Barium Swallow Study on 02/23/2016 with recommendation of dysphagia 1 diet with thin liquids and medicine crushed in applesauce secondary to moderate oral and mild pharyngeal dysphagia. Pt transferred to CIR on 02/26/2016. Pt demonstrates severe expressive aphasia, moderate cognitive impairments, dysarthria and dysphagia. Pt demonstrates impaired ability to name objects, produce spoken language at word level, impaired initiation, attention, awareness, problem solving, recall and overall safety with functional and familiar tasks. Patient also demonstrates delayed processing. Patient has decreased vocal intensity which further impacts her overall speech intelligibility at the word and occasional spontaneous phrase. Patient would benefit from skilled SLP intervention to maximize her cognitive function and speech intelligibility and overall functional independence prior to discharge home with family.  Skilled Therapeutic Interventions          Bedside Swallow Evaluation, Speech-Language Evaluation and Cognitive Linguistic Evaluation provided. See above for further details.   SLP Assessment  Patient will need skilled  Speech Lanaguage Pathology Services during CIR admission    Recommendations  Medication Administration: Whole meds with puree Supervision: Full supervision/cueing for compensatory strategies;Staff to assist with self feeding Compensations: Minimize environmental distractions;Slow rate;Small sips/bites;Monitor for anterior loss;Follow solids with liquid Postural Changes and/or Swallow Maneuvers: Seated upright 90 degrees Oral Care Recommendations: Oral care BID Recommendations for Other Services: Neuropsych consult Patient destination: Home Follow up Recommendations: Outpatient SLP;24 hour supervision/assistance Equipment Recommended: None recommended by SLP    SLP Frequency 3 to 5 out of 7 days   SLP Duration  SLP Intensity  SLP Treatment/Interventions 12-14 days  Minumum of 1-2 x/day, 30 to 90 minutes  Cognitive remediation/compensation;Cueing hierarchy;Dysphagia/aspiration precaution training;Patient/family education;Internal/external aids;Oral motor exercises;Therapeutic Exercise;Therapeutic Activities;Speech/Language facilitation;Multimodal communication approach    Pain    Prior Functioning Cognitive/Linguistic Baseline: Within functional limits (From initial CVA) Type of Home: House  Lives With: Spouse;Daughter Available Help at Discharge: Family;Available 24 hours/day Vocation: Retired  Function:  Eating Eating     Eating Assist Level: Help managing cup/glass (Pt consumed 6 sips of thin liquids via straw but had difficulty coordinating location of mouth.)           Cognition Comprehension Comprehension assist level: Understands basic 25 - 49% of the time/ requires cueing 50 - 75% of the time  Expression   Expression assist level: Expresses basic  25 - 49% of the time/requires cueing 50 - 75% of the time. Uses single words/gestures.  Social Interaction Social Interaction assist level: Interacts appropriately 50 - 74% of the time - May be physically or verbally  inappropriate.  Problem Solving Problem solving assist level: Solves basic 25 - 49% of the time - needs direction more than half the time to initiate, plan or complete simple activities  Memory Memory assist level: Recognizes or recalls less than 25% of the time/requires cueing greater than 75% of the time   Short Term Goals: Week 1: SLP Short Term Goal 1 (Week 1): Pt will name familiar objects with Min A verbal cues with 90%.  SLP Short Term Goal 2 (Week 1): Pt will increase bolus management of dysphagia 2 trials as evidenced by timely oral prep and complete oral clearing with Mod A verbal cues.  SLP Short Term Goal 3 (Week 1): Pt will demonstrate sustained attention for 5 minutes with Mod I.  SLP Short Term Goal 4 (Week 1): Pt will imitate simple automatic phrases with 90% and Mod A verbal cues.  SLP Short Term Goal 5 (Week 1): Pt will demonstrate basic problem solving for familiar tasks with Min A verbal cues.  SLP Short Term Goal 6 (Week 1): Patient will utilize an increased vocal intensity to maximize intelligigibility to ~90% at the word level with Min A verbal cues.   Refer to Care Plan for Long Term Goals  Recommendations for other services: Neuropsych  Discharge Criteria: Patient will be discharged from SLP if patient refuses treatment 3 consecutive times without medical reason, if treatment goals not met, if there is a change in medical status, if patient makes no progress towards goals or if patient is discharged from hospital.  The above assessment, treatment plan, treatment alternatives and goals were discussed and mutually agreed upon: by patient   Rutherford Nail 02/27/2016, 5:53 PM

## 2016-02-28 ENCOUNTER — Inpatient Hospital Stay (HOSPITAL_COMMUNITY): Payer: PPO | Admitting: Physical Therapy

## 2016-02-28 ENCOUNTER — Inpatient Hospital Stay (HOSPITAL_COMMUNITY): Payer: PPO

## 2016-02-28 ENCOUNTER — Inpatient Hospital Stay (HOSPITAL_COMMUNITY): Payer: PPO | Admitting: Speech Pathology

## 2016-02-28 DIAGNOSIS — S0990XS Unspecified injury of head, sequela: Secondary | ICD-10-CM | POA: Diagnosis not present

## 2016-02-28 DIAGNOSIS — R569 Unspecified convulsions: Secondary | ICD-10-CM | POA: Diagnosis not present

## 2016-02-28 DIAGNOSIS — F09 Unspecified mental disorder due to known physiological condition: Secondary | ICD-10-CM | POA: Diagnosis not present

## 2016-02-28 DIAGNOSIS — S065X2S Traumatic subdural hemorrhage with loss of consciousness of 31 minutes to 59 minutes, sequela: Secondary | ICD-10-CM | POA: Diagnosis not present

## 2016-02-28 DIAGNOSIS — I6200 Nontraumatic subdural hemorrhage, unspecified: Secondary | ICD-10-CM | POA: Diagnosis not present

## 2016-02-28 DIAGNOSIS — N390 Urinary tract infection, site not specified: Secondary | ICD-10-CM | POA: Diagnosis not present

## 2016-02-28 LAB — GLUCOSE, CAPILLARY
Glucose-Capillary: 127 mg/dL — ABNORMAL HIGH (ref 65–99)
Glucose-Capillary: 142 mg/dL — ABNORMAL HIGH (ref 65–99)
Glucose-Capillary: 163 mg/dL — ABNORMAL HIGH (ref 65–99)
Glucose-Capillary: 166 mg/dL — ABNORMAL HIGH (ref 65–99)
Glucose-Capillary: 72 mg/dL (ref 65–99)

## 2016-02-28 NOTE — Progress Notes (Signed)
Occupational Therapy Note  Patient Details  Name: Carolyn [name] MRN: [id number] Date of Birth: 1933/09/15  Today's Date: 02/28/2016 OT Individual Time: 1330-1400 OT Individual Time Calculation (min): 30 min   Pt denied pain Individual therapy  Pt resting in bed upon arrival.  Pt attempted to communicate that she needed to use the bathroom.  Pt required max questioning cues and eventually was able to communicate needs.  Pt amb with RW to bathroom with min A and max multimodal cues to avoid objects on her right when entering and exiting the bathroom.  Pt noted with decreased coordination of RUE when attempting to use toilet paper to perform hygiene.  Pt returned to bed with all needs within reach.  Pt required assistance with clothing management.   Leotis Shames [facility] 02/28/2016, 2:14 PM

## 2016-02-28 NOTE — IPOC Note (Signed)
Overall Plan of Care [facility]) Patient Details Name: Carolyn [name] MRN: [id number] DOB: [birth year]  Admitting Diagnosis: debility, TBI  Hospital Problems: Active Problems:   Subdural hematoma (HCC)   Traumatic subdural hematoma (HCC)   Late effect of head trauma, cognitive deficits   Dysphagia   Seizures ([place])   Acute lower UTI     Functional Problem List: Nursing Pain, Safety, Skin Integrity, Endurance, Edema  PT Balance, Edema, Endurance, Motor, Pain, Safety  OT Balance, Cognition, Edema, Endurance, Motor, Perception, Pain, Safety, Sensory  SLP Cognition, Nutrition, Safety  TR         Basic ADL's: OT Eating, Bathing, Grooming, Dressing, Toileting     Advanced  ADL's: OT       Transfers: PT Bed Mobility, Bed to Chair, Car, Manufacturing systems engineer, Metallurgist: PT Ambulation, Emergency planning/management officer, Stairs     Additional Impairments: OT Fuctional Use of Upper Extremity  SLP Swallowing, Communication, Social Cognition comprehension, expression Social Interaction, Problem Solving, Memory, Attention, Awareness  TR      Anticipated Outcomes Item Anticipated Outcome  Self Feeding supervision  Swallowing  Mod A    Basic self-care  supervision  Toileting  supervision   Bathroom Transfers supervision  Bowel/Bladder  Mod I  Transfers  supervision  Locomotion  supervision  Communication  Mod A  Cognition  Mod A  Pain  <3  Safety/Judgment  Supervision   Therapy Plan: PT Intensity: Minimum of 1-2 x/day ,45 to 90 minutes PT Frequency: 5 out of 7 days PT Duration Estimated Length of Stay: 12-14 days OT Intensity: Minimum of 1-2 x/day, 45 to 90 minutes OT Frequency: 5 out of 7 days OT Duration/Estimated Length of Stay: 2 weeks SLP Intensity: Minumum of 1-2 x/day, 30 to 90 minutes SLP Frequency: 3 to 5 out of 7 days SLP Duration/Estimated Length of Stay: 12-14 days       Team Interventions: Nursing Interventions Skin Care/Wound Management,  Disease Management/Prevention, Patient/Family Education, Psychosocial Support  PT interventions Ambulation/gait training, Training and development officer, Cognitive remediation/compensation, Community reintegration, Discharge planning, Disease management/prevention, DME/adaptive equipment instruction, Functional mobility training, Neuromuscular re-education, Pain management, Patient/family education, Psychosocial support, Stair training, Therapeutic Activities, Therapeutic Exercise, UE/LE Strength taining/ROM, UE/LE Coordination activities, Wheelchair propulsion/positioning  OT Interventions Training and development officer, Cognitive remediation/compensation, Academic librarian, Discharge planning, DME/adaptive equipment instruction, Disease mangement/prevention, Functional electrical stimulation, Functional mobility training, Neuromuscular re-education, Pain management, Psychosocial support, Patient/family education, Self Care/advanced ADL retraining, Splinting/orthotics, Therapeutic Exercise, UE/LE Coordination activities, UE/LE Strength taining/ROM, Therapeutic Activities, Skin care/wound managment  SLP Interventions Cognitive remediation/compensation, Cueing hierarchy, Dysphagia/aspiration precaution training, Patient/family education, Internal/external aids, Oral motor exercises, Therapeutic Exercise, Therapeutic Activities, Speech/Language facilitation, Multimodal communication approach  TR Interventions    SW/CM Interventions Discharge Planning, Psychosocial Support, Patient/Family Education    Team Discharge Planning: Destination: PT-Home ,OT- Home , SLP-Home Projected Follow-up: PT-Home health PT, 24 hour supervision/assistance, OT-  Home health OT, SLP-Outpatient SLP, 24 hour supervision/assistance Projected Equipment Needs: PT-To be determined, OT- To be determined, SLP-None recommended by SLP Equipment Details: PT- , OT-  Patient/family involved in discharge planning: PT- Patient,  OT-Patient,  SLP-Patient  MD ELOS: 12-14 days Medical Rehab Prognosis:  Excellent Assessment: The patient has been admitted for CIR therapies with the diagnosis of TBI with complication. The team will be addressing functional mobility, strength, stamina, balance, safety, adaptive techniques and equipment, self-care, bowel and bladder mgt, patient and caregiver education, cognition, communication, language, NMR, pain control, community reintegration. Goals have been set at supervision  to mod I with mobility and self-care and mod assist with communication/cognition.    Meredith Staggers, MD, FAAPMR      See Team Conference Notes for weekly updates to the plan of care

## 2016-02-28 NOTE — Progress Notes (Signed)
Speech Language Pathology Daily Session Note  Patient Details  Name: Carolyn [name] MRN: [id number] Date of Birth: 10/06/1933  Today's Date: 02/28/2016 SLP Individual Time: 1100-1130 SLP Individual Time Calculation (min): 30 min  Short Term Goals: Week 1: SLP Short Term Goal 1 (Week 1): Pt will name familiar objects with Min A verbal cues with 90%.  SLP Short Term Goal 2 (Week 1): Pt will increase bolus management of dysphagia 2 trials as evidenced by timely oral prep and complete oral clearing with Mod A verbal cues.  SLP Short Term Goal 3 (Week 1): Pt will demonstrate sustained attention for 5 minutes with Mod I.  SLP Short Term Goal 4 (Week 1): Pt will imitate simple automatic phrases with 90% and Mod A verbal cues.  SLP Short Term Goal 5 (Week 1): Pt will demonstrate basic problem solving for familiar tasks with Min A verbal cues.  SLP Short Term Goal 6 (Week 1): Patient will utilize an increased vocal intensity to maximize intelligigibility to ~90% at the word level with Min A verbal cues.   Skilled Therapeutic Interventions: Skilled treatment session focused on functional communication. Patient was able to name functional items with 90% accuracy with Min A sematic cues and required Mod-Max A verbal cues for speech intelligibility at the word level in regards to an increased vocal intensity and over articulation. Patient also decoded at the word level and demonstrated reading comprehension at the word level with 85% accuracy. Patient appeared to demonstrate apraxia while attempting to "grab" patients from clinician. Patient also answered basic yes/no questions with 100% accuracy in regards to wants/needs and demonstrated selective attention to tasks in a mildly distracting environment for 30 minutes with Min A verbal cues for redirection. Patient handed off to NT to transfer patient back to bed. Continue with current plan of care.    Function:   Cognition Comprehension Comprehension  assist level: Understands basic 90% of the time/cues < 10% of the time  Expression   Expression assist level: Expresses basis less than 25% of the time/requires cueing >75% of the time.  Social Interaction Social Interaction assist level: Interacts appropriately 90% of the time - Needs monitoring or encouragement for participation or interaction.  Problem Solving Problem solving assist level: Solves basic 25 - 49% of the time - needs direction more than half the time to initiate, plan or complete simple activities  Memory Memory assist level: Recognizes or recalls less than 25% of the time/requires cueing greater than 75% of the time    Pain No/Denies Pain   Therapy/Group: Individual Therapy  Taft Worthing 02/28/2016, 12:34 PM

## 2016-02-28 NOTE — Progress Notes (Signed)
[place] PHYSICAL MEDICINE & REHABILITATION     PROGRESS NOTE    Subjective/Complaints: No apparent issues overnight. Appears comfortable  ROS limited by aphasia and cognitive deficits.   Objective: Vital Signs: Blood pressure 130/70, pulse 77, temperature 98 F (36.7 C), temperature source Oral, resp. rate 16, height 5\' 6"  (1.676 m), weight 93.9 kg (207 lb 0.2 oz), last menstrual period 03/24/2010, SpO2 99 %. No results found.  Recent Labs  02/26/16 1642  WBC 8.5  HGB 10.5*  HCT 32.7*  PLT 218    Recent Labs  02/26/16 1642  NA 140  K 4.1  CL 102  GLUCOSE 84  BUN 10  CREATININE 0.53  CALCIUM 9.5   CBG (last 3)   Recent Labs  02/28/16 0019 02/28/16 0348 02/28/16 0800  GLUCAP 163* 142* 166*    Wt Readings from Last 3 Encounters:  02/28/16 93.9 kg (207 lb 0.2 oz)  02/26/16 90.9 kg (200 lb 6.4 oz)  02/05/16 90.3 kg (199 lb 1.2 oz)    Physical Exam:  Constitutional: She appears well-developed and well-nourished.  HENT:  Craniotomy site clean and dry. Nasogastric tube in place  Eyes: Conjunctivae are normal.  Pupils reactive to light  Neck: Normal range of motion. Neck supple. No thyromegaly present.  Cardiovascular: Normal rate and regular rhythm.  Respiratory: Effort normal and breath sounds normal. No respiratory distress.  GI: Soft. Bowel sounds are normal. She exhibits no distension.  Musculoskeletal: She exhibits no edema or tenderness.  Neurological: She is alert.  Patient nonverbal DTRs symmetric Spontaneously moves rightt arm and leg. Is perseverative at times. Appears alert. Makes good eye contact  Skin: Skin is warm and dry.  Psychiatric:  flat  Assessment/Plan: 1. 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 Physiatrist is providing close team supervision and 24 hour management of active medical problems listed  below. Physiatrist and rehab team continue to assess barriers to discharge/monitor patient progress toward functional and medical goals.  Function:  Bathing Bathing position   Position: Shower  Bathing parts Body parts bathed by patient: Right arm, Left arm, Chest, Abdomen, Right upper leg, Left upper leg Body parts bathed by helper: Front perineal area, Buttocks, Right lower leg, Left lower leg, Back  Bathing assist Assist Level: Touching or steadying assistance(Pt > 75%)      Upper Body Dressing/Undressing Upper body dressing   What is the patient wearing?: Hospital gown                Upper body assist Assist Level: Touching or steadying assistance(Pt > 75%)   Set up : To obtain clothing/put away  Lower Body Dressing/Undressing Lower body dressing   What is the patient wearing?: Hospital Gown, Non-skid slipper socks           Non-skid slipper socks- Performed by helper: Don/doff right sock, Don/doff left sock                  Lower body assist Assist for lower body dressing: Touching or steadying assistance (Pt > 75%)      Toileting Toileting Toileting activity did not occur: No continent bowel/bladder event Toileting steps completed by patient: Performs perineal hygiene, Adjust clothing after toileting Toileting steps completed by helper: Adjust clothing prior to toileting Toileting Assistive Devices: Grab bar or rail  Toileting assist Assist level: Touching or steadying assistance (Pt.75%)   Transfers Chair/bed transfer   Chair/bed transfer  method: Stand pivot Chair/bed transfer assist level: Moderate assist (Pt 50 - 74%/lift or lower) Chair/bed transfer assistive device: Armrests, Medical sales representative     Max distance: 20 ft Assist level: Touching or steadying assistance (Pt > 75%)   Wheelchair   Type: Manual   Assist Level: Dependent (Pt equals 0%)  Cognition Comprehension Comprehension assist level: Understands basic 90% of  the time/cues < 10% of the time  Expression Expression assist level: Expresses basic 25 - 49% of the time/requires cueing 50 - 75% of the time. Uses single words/gestures.  Social Interaction Social Interaction assist level: Interacts appropriately 50 - 74% of the time - May be physically or verbally inappropriate.  Problem Solving Problem solving assist level: Solves basic 25 - 49% of the time - needs direction more than half the time to initiate, plan or complete simple activities  Memory Memory assist level: Recognizes or recalls 25 - 49% of the time/requires cueing 50 - 75% of the time     Medical Problem List and Plan: 1. Right hemiparesthesias/aphasia/dysphasia secondary to traumatic subdural hematoma status post left craniotomy evacuated subdural hematoma with reaccumulation of fluid craniotomy 2  -continue CIR therapies today 2. DVT Prophylaxis/Anticoagulation: SCDs. Monitor for any signs of DVT 3. Pain Management: Tylenol as needed 4. Dysphagia. Dysphagia #1 thin liquids. Nasogastric tube remains in place. Monitor for any signs of aspiration. Follow-up speech therapy  -will need to initiate better to meet PO needs 5. Neuropsych: This patient is not capable of making decisions on her own behalf. 6. Skin/Wound Care: Routine skin checks 7. Fluids/Electrolytes/Nutrition: Routine I&O with follow-up chemistries 8. Seizure disorder. Keppra 1250 mg every 12 hours.   -continue at current dose with sedation after dosing however.  9. Hypertension. Lisinopril 20 mg daily at bedtime, verapamil 40 mg 3 times a day,Lopressor 25 mg BID. Monitor with increased mobility 10. Diabetes mellitus. Hemoglobin A1c 6.7. Amaryl 4 mg daily at bedtime. Check blood sugars before meals and at bedtime  -follow for pattern 11. COPD. Continue Dulera and nebulizers as directed 12. UTI. Continue Rocephin for now base on +UA. Urine collected for cx yesterday and pending 13. Hypothyroidism. Thyroid 60 mg daily at  bedtime. Latest TSH 0.285  LOS (Days) 2 A FACE TO FACE EVALUATION WAS PERFORMED  SWARTZ,ZACHARY T 02/28/2016 8:40 AM

## 2016-02-28 NOTE — Progress Notes (Signed)
Physical Therapy Session Note  Patient Details  Name: Carolyn [name] MRN: [id number] Date of Birth: 1933/02/24  Today's Date: 02/28/2016 PT Individual Time: 0800-0900 PT Individual Time Calculation (min): 60 min   Short Term Goals: Week 1:  PT Short Term Goal 1 (Week 1): Patient will ambulate 50 ft using RW with supervision. PT Short Term Goal 2 (Week 1): Patient will negotiate 2 (6") stairs using 2 rails with min A.  PT Short Term Goal 3 (Week 1): Patient will maintain standing balance x 3 min with supervision.  PT Short Term Goal 4 (Week 1): Patient will perform bed mobility with consistent supervision.  Skilled Therapeutic Interventions/Progress Updates:   Patient sitting upright in bed eating breakfast meal, handoff from NT. Patient required max verbal/visual cues to attend to RUE and for awareness she was holding drink to prevent spilling. Patient transferred to edge of bed with [facility] raised using rail and scooted to edge with supervision and increased time. Patient ambulated from bed to bathroom using RW with min guard and performed toilet transfer with mod A and toileting tasks with steady assist, verbal cues for use of LUE for hygiene as patient attempting with RUE but unable to hold toilet paper. Patient instructed in hand hygiene at wheelchair level with cues for sequencing and brushed teeth with max cues to attend to RUE as patient repeatedly dropping toothpaste, toothbrush, etc and requiring cues to utilize LUE to successfully complete task. Patient required step-by-step cues for sequencing and perseverated on brushing teeth and hand washing. Patient instructed in wheelchair propulsion using BUE but unable to coordinate/attend to RUE. Gait training using RW 2 x 35 ft with supervision-min guard. Patient required min-mod A for sit <> stand from wheelchair with max cues for safe hand placement using RW. Patient verbalized, "Panties?" after toileting and asked "How far?" when ambulating. Patient  left sitting in recliner with BLE elevated and needs in reach.   Therapy Documentation Precautions:  Precautions Precautions: Fall Precaution Comments: fall, expressive aphasia, right hemiparesis Restrictions Weight Bearing Restrictions: No Pain: Pain Assessment Pain Assessment: No/denies pain  See Function Navigator for Current Functional Status.   Therapy/Group: Individual Therapy  Laretta Alstrom 02/28/2016, 9:00 AM

## 2016-02-28 NOTE — Progress Notes (Signed)
Occupational Therapy Session Note  Patient Details  Name: Carolyn [name] MRN: [id number] Date of Birth: March 16, 1933  Today's Date: 02/28/2016 OT Individual Time: 1000-1100 OT Individual Time Calculation (min): 60 min    Short Term Goals: Week 1:  OT Short Term Goal 1 (Week 1): Pt will transfer to toilet with consistant supervision OT Short Term Goal 2 (Week 1): Pt will perform 3/3 toileting steps with mod A  OT Short Term Goal 3 (Week 1): Pt will perform LB dressing (underwear, pants, socks and shoes ) with mod A OT Short Term Goal 4 (Week 1): Pt will stand to complete 2 grooming tasks with min A with one UE support before needing to sit OT Short Term Goal 5 (Week 1): Pt will initiate tasks with min prompting in a 60 min session   Skilled Therapeutic Interventions/Progress Updates:    Pt engaged in BADL retraining including bathing at shower level, toilet transfers, toileting, and dressing with sit<>stand from recliner.  Pt amb with RW to bathroom for use of toilet before transferring to tub bench for shower.  Pt used her RUE functionally and appropriately during all bathing and dressing tasks.  Pt noted with slight right inattention with amb and would run into objects on her right with decreased awareness.  Pt completed grooming tasks standing at sink.  Pt remained in recliner with all needs within reach.  Pt continues to exhibit difficulty with expressing needs.   Therapy Documentation Precautions:  Precautions Precautions: Fall Precaution Comments: fall, expressive aphasia, right hemiparesis Restrictions Weight Bearing Restrictions: No   Pain: Pain Assessment Pain Assessment: No/denies pain ADL: ADL ADL Comments: see functional navigator  See Function Navigator for Current Functional Status.   Therapy/Group: Individual Therapy  Leroy Libman 02/28/2016, 12:12 PM

## 2016-02-28 NOTE — Progress Notes (Signed)
Speech Language Pathology Daily Session Note  Patient Details  Name: AKAYA HAINS MRN: [id number] Date of Birth: 12/16/1932  Today's Date: 02/28/2016 SLP Individual Time: 1300-1315 SLP Individual Time Calculation (min): 15 min  Short Term Goals: Week 1: SLP Short Term Goal 1 (Week 1): Pt will name familiar objects with Min A verbal cues with 90%.  SLP Short Term Goal 2 (Week 1): Pt will increase bolus management of dysphagia 2 trials as evidenced by timely oral prep and complete oral clearing with Mod A verbal cues.  SLP Short Term Goal 3 (Week 1): Pt will demonstrate sustained attention for 5 minutes with Mod I.  SLP Short Term Goal 4 (Week 1): Pt will imitate simple automatic phrases with 90% and Mod A verbal cues.  SLP Short Term Goal 5 (Week 1): Pt will demonstrate basic problem solving for familiar tasks with Min A verbal cues.  SLP Short Term Goal 6 (Week 1): Patient will utilize an increased vocal intensity to maximize intelligigibility to ~90% at the word level with Min A verbal cues.   Skilled Therapeutic Interventions: Skilled ST session provided focusing on cognition goals. SLP facilitated session by providing max A verbal, visual and tactile prompts for attention. Session limited due to pt's fatigue. Pt able to respond with a gruntal "no" to all attempts by SLP. SLP and nursing respositioned pt in bed and she immediately pulled the blankets up and turned to her side. Pt able to make eye contact with SLP as reading tasks were introduced and again pt grunted "no" and closed her eyes. Cold compresses were attempted with no improvement in ability to maintain alertness. Pt left asleep in bed with alarm on and all needs within reach. Continue current plan of care.   Function:   Cognition Comprehension Comprehension assist level: Understands complex 90% of the time/cues 10% of the time  Expression   Expression assist level: Expresses basis less than 25% of the time/requires cueing >75%  of the time.  Social Interaction Social Interaction assist level: Interacts appropriately 90% of the time - Needs monitoring or encouragement for participation or interaction.  Problem Solving Problem solving assist level: Solves basic 25 - 49% of the time - needs direction more than half the time to initiate, plan or complete simple activities  Memory Memory assist level: Recognizes or recalls less than 25% of the time/requires cueing greater than 75% of the time    Pain    Therapy/Group: Individual Therapy  Chrishawna Farina 02/28/2016, 1:14 PM

## 2016-02-29 ENCOUNTER — Inpatient Hospital Stay (HOSPITAL_COMMUNITY): Payer: PPO | Admitting: Speech Pathology

## 2016-02-29 DIAGNOSIS — S0990XS Unspecified injury of head, sequela: Secondary | ICD-10-CM | POA: Diagnosis not present

## 2016-02-29 DIAGNOSIS — F09 Unspecified mental disorder due to known physiological condition: Secondary | ICD-10-CM | POA: Diagnosis not present

## 2016-02-29 DIAGNOSIS — R569 Unspecified convulsions: Secondary | ICD-10-CM | POA: Diagnosis not present

## 2016-02-29 DIAGNOSIS — S065X2S Traumatic subdural hemorrhage with loss of consciousness of 31 minutes to 59 minutes, sequela: Secondary | ICD-10-CM | POA: Diagnosis not present

## 2016-02-29 DIAGNOSIS — I6200 Nontraumatic subdural hemorrhage, unspecified: Secondary | ICD-10-CM | POA: Diagnosis not present

## 2016-02-29 DIAGNOSIS — N390 Urinary tract infection, site not specified: Secondary | ICD-10-CM | POA: Diagnosis not present

## 2016-02-29 LAB — GLUCOSE, CAPILLARY
Glucose-Capillary: 101 mg/dL — ABNORMAL HIGH (ref 65–99)
Glucose-Capillary: 121 mg/dL — ABNORMAL HIGH (ref 65–99)
Glucose-Capillary: 142 mg/dL — ABNORMAL HIGH (ref 65–99)

## 2016-02-29 LAB — URINE CULTURE: Culture: >=100000 — AB

## 2016-02-29 NOTE — Progress Notes (Signed)
Social Work  Social Work Assessment and Plan  Patient Details  Name: Carolyn [name] MRN: [id number] Date of Birth: 26-Dec-1932  Today's Date: 02/29/2016  Problem List:  Patient Active Problem List   Diagnosis Date Noted  . Late effect of head trauma, cognitive deficits   . Dysphagia   . Seizures ([place])   . Acute lower UTI   . Altered mental status   . Atelectasis   . Spastic hemiplegia affecting right side 02/19/2016  . S/P craniotomy 02/17/2016  . Traumatic subdural hematoma ([place]) 02/12/2016  . Headache as late effect of brain injury ([place])   . Seizure ([place])   . Difficulty controlling behavior as late effect of traumatic brain injury ([place])   . 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 Memory dysfunction as late effect of traumatic brain injury ([place])   . Benign essential HTN   . DM type 2 with diabetic peripheral neuropathy ([place])   . Thyroid activity decreased   . TBI (traumatic brain injury) ([place])   . Cognitive deficit as late effect of traumatic brain injury ([place])   . Falls frequently   . Controlled type 2 diabetes mellitus with complication, without long-term current use of insulin ([place])   . Tachycardia   . Chronic obstructive pulmonary disease ([place])   . Subdural hematoma ([place]) 02/05/2016  . Chronic bronchitis ([place])   . SDH (subdural hematoma) ([place]) 01/20/2016  . Fall 01/20/2016  . Closed fracture of left elbow 01/20/2016  . Diabetes mellitus without complication ([place])   . Hypercholesterolemia   . COPD (chronic obstructive pulmonary disease) ([place])   . Hypothyroid   . Essential hypertension   . Inguinal hernia unilateral, non-recurrent, left 12/02/2011   Past Medical History:  Past Medical History  Diagnosis Date  . Diabetes mellitus   . Hypertension   . Hypercholesterolemia   . Gallstones   . Glaucoma   . Cataracts, bilateral   . COPD (chronic obstructive pulmonary disease) ([place])   . Hypothyroid    . PONV (postoperative nausea and vomiting)   . Asthma   . Chronic kidney disease 2007    kidney cancer and partial nephrctomy-right  . Subdural bleeding [facility])    Past Surgical History:  Past Surgical History  Procedure Laterality Date  . Dilation and curettage of uterus    . Partial nephrectomy      cancer  . Breast biopsy    . D&c for enlarged uterine  210  . Total abdominal hysterectomy w/ bilateral salpingoophorectomy  2011  . Cateract extractions and iol    . Cholecystectomy  2008  . Abdominal hysterectomy    . Eye surgery  08/2011,09/2012    bilateral cataracts  . Hernia repair  01/19/12    inguinal  . Inguinal hernia repair  01/19/2012    Procedure: HERNIA REPAIR INGUINAL ADULT;  Surgeon: Earnstine Regal, MD;  Location: [facility] ORS;  Service: General;  Laterality: Left;  repair left inguinal hernia with mesh   . Craniotomy Left 02/06/2016    Procedure: CRANIOTOMY HEMATOMA EVACUATION SUBDURAL;  Surgeon: Kevan Ny Ditty, MD;  Location: [facility] NEURO ORS;  Service: Neurosurgery;  Laterality: Left;  . Craniotomy N/A 02/17/2016    Procedure: CRANIOTOMY HEMATOMA EVACUATION SUBDURAL;  Surgeon: Kevan Ny Ditty, MD;  Location: [place] NEURO ORS;  Service: Neurosurgery;  Laterality: N/A;  left  . Craniotomy Left 02/18/2016    Procedure: CRANIOTOMY HEMATOMA EVACUATION SUBDURAL;  Surgeon: Earnie Larsson, MD;  Location: [place] NEURO ORS;  Service: Neurosurgery;  Laterality: Left;   Social History:  reports that she quit smoking about 34 years ago. Her smoking use included Cigarettes. She has a 5 pack-year smoking history. She has never used smokeless tobacco. She reports that she does not drink alcohol or use illicit drugs.  Family / Support Systems Marital Status: Married Patient Roles: Parent Spouse/Significant Other: husband, Christalle Barrer @ [phone] Children: daughter, Palma Holter @ [phone] (living with pt/ spouse); daughter, Kingston Mccrite [facility]) @ [phone] and son, Barnabas Lister, living in  [place], [place] Anticipated Caregiver: daughter, [name] Ability/Limitations of Caregiver: daughter worked for 58 years for [employer], either surgical tech or RM, states she has a bad back Caregiver Availability: 24/7 Family Dynamics: Daughter remains very involved and supportive. Has been providing assistance to her father as well.  Social History Preferred language: English Religion: Lutheran Cultural Background: NA Education: HS Read: Yes Write: Yes Employment Status: Retired Freight forwarder Issues: None Guardian/Conservator: None - per MD, pt is not capable of making decisions on her own behalf.  Defer to spouse/ daughter.   Abuse/Neglect Physical Abuse: Denies Verbal Abuse: Denies Sexual Abuse: Denies Exploitation of patient/patient's resources: Denies Self-Neglect: Denies  Emotional Status Pt's affect, behavior adn adjustment status: Pt lying in bed and could not arouse.  Daughter reports that she is still having difficulty with her speech.  Daughter notes no change overall from prior CIR stay.  Will refer for neuropsychology to evaluate. Recent Psychosocial Issues: None Pyschiatric History: None  Patient / Family Perceptions, Expectations & Goals Pt/Family understanding of illness & functional limitations: Daughter reports pt has a basic understanding that she underwent further surgeries.  Daughter with very good understanding of the surgeries performed and of her current functional limitations. Premorbid pt/family roles/activities: Pt was completely independent PTA and still driving per daughter. Anticipated changes in roles/activities/participation: Goals set for supervision overall, however, daughter may need to provide some level of physical assistance at times. Daughter to provide caregiver support Pt/family expectations/goals: Daughter hopeful pt can reach supervision goals.  Community Resources Express Scripts: None Transportation available at discharge:  yes Resource referrals recommended: Neuropsychology, Support group (specify)  Discharge Planning Living Arrangements: Spouse/significant other, Children Support Systems: Children, Spouse/significant other Type of Residence: Private residence [employer] Resources: Multimedia programmer (specify) ([employer] advantage) Museum/gallery curator Resources: Radio broadcast assistant Screen Referred: No Living Expenses: Own Money Management: Family Does the patient have any problems obtaining your medications?: No Home Management: shared by daughter and pt Patient/Family Preliminary Plans: Pt to d/c home with spouse and daughter.  Daughter to provide primary caregiver support. Social Work Anticipated Follow Up Needs: HH/OP Expected length of stay: 12-14 days  Clinical Impression Elderly woman who returns to CIR following further neurosurgical interventions.  Lying in bed and unable to arouse.  Daughter continues to be very involved and supportive and notes all info from original assessment stands.  Will follow for support and d/c planning needs.  Lemmie Steinhaus 02/29/2016, 4:04 PM

## 2016-02-29 NOTE — Care Management Note (Signed)
Inpatient [facility] Individual Statement of Services  Patient Name:  Carolyn [name]  Date:  02/29/2016  Welcome to the [facility].  Our goal is to provide you with an individualized program based on your diagnosis and situation, designed to meet your specific needs.  With this comprehensive rehabilitation program, you will be expected to participate in at least 3 hours of rehabilitation therapies Monday-Friday, with modified therapy programming on the weekends.  Your rehabilitation program will include the following services:  Physical Therapy (PT), Occupational Therapy (OT), Speech Therapy (ST), 24 hour per day rehabilitation nursing, Therapeutic Recreaction (TR), Neuropsychology, Case Management (Social Worker), Rehabilitation Medicine, Nutrition Services and Pharmacy Services  Weekly team conferences will be held on Tuesdays to discuss your progress.  Your Social Worker will talk with you frequently to get your input and to update you on team discussions.  Team conferences with you and your family in attendance may also be held.  Expected length of stay: 12-14 days  Overall anticipated outcome: supervision   Depending on your progress and recovery, your program may change. Your Social Worker will coordinate services and will keep you informed of any changes. Your Social Worker's name and contact numbers are listed  below.  The following services may also be recommended but are not provided by the [facility] will be made to provide these services after discharge if needed.  Arrangements include referral to agencies that provide these services.  Your insurance has been verified to be:  [employer] Your primary doctor is:  Lavone Orn  Pertinent information will be shared with your doctor and your insurance  company.  Social Worker:  [place], [place] or ([phone]   Information discussed with and copy given to patient by: Lennart Pall, 02/29/2016, 3:52 PM

## 2016-02-29 NOTE — Progress Notes (Signed)
Speech Language Pathology Daily Session Note  Patient Details  Name: Carolyn [name] MRN: [id number] Date of Birth: Dec 04, 1932  Today's Date: 02/29/2016 SLP Individual Time: [id number] SLP Individual Time Calculation (min): 45 min  Short Term Goals: Week 1: SLP Short Term Goal 1 (Week 1): Pt will name familiar objects with Min A verbal cues with 90%.  SLP Short Term Goal 2 (Week 1): Pt will increase bolus management of dysphagia 2 trials as evidenced by timely oral prep and complete oral clearing with Mod A verbal cues.  SLP Short Term Goal 3 (Week 1): Pt will demonstrate sustained attention for 5 minutes with Mod I.  SLP Short Term Goal 4 (Week 1): Pt will imitate simple automatic phrases with 90% and Mod A verbal cues.  SLP Short Term Goal 5 (Week 1): Pt will demonstrate basic problem solving for familiar tasks with Min A verbal cues.  SLP Short Term Goal 6 (Week 1): Patient will utilize an increased vocal intensity to maximize intelligigibility to ~90% at the word level with Min A verbal cues.   Skilled Therapeutic Interventions: Skilled treatment session focused on addressing communication and swallow goals. Patient asleep upon SLP arrival, but was easily aroused.  SLP facilitated session by providing the context of meal tray set up to facilitate accurate yes/no responses.  Patient overall ~70% accurate with responses with frustration with SLP at times due to errors with little to no awareness of them until she could see SLP doing the wrong thing.  Following tray set-up patient self fed in a quiet environment with Min verbal and visual cues to sustain attention to task for ~5 minutes.  Patient consumed 5 bites of Dys.1 textures with use of thin liquid straw sips to assist in oral clearance.  Patient appeared to have with piecemeal swallows but no observed oral holding or overt s/s of aspiration.  Continue with current plan of care.   Function:  Eating Eating   Modified Consistency Diet:  Yes Eating Assist Level: Help with picking up utensils;Help managing cup/glass;Supervision or verbal cues;Set up assist for   Eating Set Up Assist For: Opening containers       Cognition Comprehension Comprehension assist level: Understands basic 90% of the time/cues < 10% of the time  Expression   Expression assist level: Expresses basis less than 25% of the time/requires cueing >75% of the time.  Social Interaction Social Interaction assist level: Interacts appropriately 75 - 89% of the time - Needs redirection for appropriate language or to initiate interaction.  Problem Solving Problem solving assist level: Solves basic 25 - 49% of the time - needs direction more than half the time to initiate, plan or complete simple activities  Memory Memory assist level: Recognizes or recalls 25 - 49% of the time/requires cueing 50 - 75% of the time    Pain Pain Assessment Pain Assessment: No/denies pain  Therapy/Group: Individual Therapy  Carmelia Roller., CCC-SLP [id number]  [place] 02/29/2016, 8:44 AM

## 2016-02-29 NOTE — Progress Notes (Signed)
[place] PHYSICAL MEDICINE & REHABILITATION     PROGRESS NOTE    Subjective/Complaints: No new issues. Uneventful night per RN  ROS limited by aphasia and cognitive deficits.   Objective: Vital Signs: Blood pressure 140/64, pulse 66, temperature 98.7 F (37.1 C), temperature source Oral, resp. rate 18, height 5\' 6"  (1.676 m), weight 89.2 kg (196 lb 10.4 oz), last menstrual period 03/24/2010, SpO2 97 %. No results found.  Recent Labs  02/26/16 1642  WBC 8.5  HGB 10.5*  HCT 32.7*  PLT 218    Recent Labs  02/26/16 1642  NA 140  K 4.1  CL 102  GLUCOSE 84  BUN 10  CREATININE 0.53  CALCIUM 9.5   CBG (last 3)   Recent Labs  02/28/16 0800 02/28/16 1200 02/28/16 1539  GLUCAP 166* 127* 72    Wt Readings from Last 3 Encounters:  02/29/16 89.2 kg (196 lb 10.4 oz)  02/26/16 90.9 kg (200 lb 6.4 oz)  02/05/16 90.3 kg (199 lb 1.2 oz)    Physical Exam:  Constitutional: She appears well-developed and well-nourished.  HENT:  Craniotomy site clean and dry. Nasogastric tube in place  Eyes: Conjunctivae are normal.  Pupils reactive to light  Neck: Normal range of motion. Neck supple. No thyromegaly present.  Cardiovascular: Normal rate and regular rhythm.  Respiratory: Effort normal and breath sounds normal. No respiratory distress.  GI: Soft. Bowel sounds are normal. She exhibits no distension.  Musculoskeletal: She exhibits no edema or tenderness.  Neurological: She is alert.  Patient nonverbal DTRs symmetric Spontaneously moves right arm and leg. Is perseverative at times. Fairly alert. Delayed processing  Skin: Skin is warm and dry.  Psychiatric:  flat  Assessment/Plan: 1. 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 Physiatrist is providing close team supervision and 24 hour management of active medical problems listed  below. Physiatrist and rehab team continue to assess barriers to discharge/monitor patient progress toward functional and medical goals.  Function:  Bathing Bathing position   Position: Shower  Bathing parts Body parts bathed by patient: Right arm, Left arm, Chest, Abdomen, Right upper leg, Left upper leg, Buttocks, Front perineal area Body parts bathed by helper: Right lower leg, Left lower leg, Back  Bathing assist Assist Level: Touching or steadying assistance(Pt > 75%)      Upper Body Dressing/Undressing Upper body dressing   What is the patient wearing?: Pull over shirt/dress     Pull over shirt/dress - Perfomed by patient: Thread/unthread right sleeve, Thread/unthread left sleeve, Pull shirt over trunk Pull over shirt/dress - Perfomed by helper: Put head through opening        Upper body assist Assist Level: Touching or steadying assistance(Pt > 75%)   Set up : To obtain clothing/put away  Lower Body Dressing/Undressing Lower body dressing   What is the patient wearing?: Underwear, Non-skid slipper socks Underwear - Performed by patient: Pull underwear up/down Underwear - Performed by helper: Thread/unthread right underwear leg, Thread/unthread left underwear leg       Non-skid slipper socks- Performed by helper: Don/doff right sock, Don/doff left sock                  Lower body assist Assist for lower body dressing: Touching or steadying assistance (Pt > 75%)      Toileting Toileting Toileting activity did not occur: No continent bowel/bladder event Toileting steps completed by patient: Performs perineal hygiene,  Adjust clothing prior to toileting Toileting steps completed by helper: Adjust clothing after toileting Toileting Assistive Devices: Grab bar or rail  Toileting assist Assist level: Touching or steadying assistance (Pt.75%)   Transfers Chair/bed transfer   Chair/bed transfer method: Ambulatory Chair/bed transfer assist level: Moderate assist  (Pt 50 - 74%/lift or lower) Chair/bed transfer assistive device: Armrests, Medical sales representative     Max distance: 35 ft Assist level: Touching or steadying assistance (Pt > 75%)   Wheelchair   Type: Manual   Assist Level: Dependent (Pt equals 0%)  Cognition Comprehension Comprehension assist level: Understands complex 90% of the time/cues 10% of the time  Expression Expression assist level: Expresses basis less than 25% of the time/requires cueing >75% of the time.  Social Interaction Social Interaction assist level: Interacts appropriately 90% of the time - Needs monitoring or encouragement for participation or interaction.  Problem Solving Problem solving assist level: Solves basic 25 - 49% of the time - needs direction more than half the time to initiate, plan or complete simple activities  Memory Memory assist level: Recognizes or recalls less than 25% of the time/requires cueing greater than 75% of the time     Medical Problem List and Plan: 1. Right hemiparesthesias/aphasia/dysphasia secondary to traumatic subdural hematoma status post left craniotomy evacuated subdural hematoma with reaccumulation of fluid craniotomy 2  -continue CIR therapies   2. DVT Prophylaxis/Anticoagulation: SCDs. Monitor for any signs of DVT 3. Pain Management: Tylenol as needed 4. Dysphagia. Dysphagia #1 thin liquids. Nasogastric tube remains in place. Monitor for any signs of aspiration. Follow-up speech therapy  -will need to initiate better to meet PO needs  -consider megace trial/stimulant 5. Neuropsych: This patient is not capable of making decisions on her own behalf. 6. Skin/Wound Care: Routine skin checks 7. Fluids/Electrolytes/Nutrition: Routine I&O with follow-up chemistries 8. Seizure disorder. Keppra 1250 mg every 12 hours.   -continue at current dose with sedation after dosing however.  9. Hypertension. Lisinopril 20 mg daily at bedtime, verapamil 40 mg 3 times a  day,Lopressor 25 mg BID.  -controlled at present 10. Diabetes mellitus. Hemoglobin A1c 6.7. Amaryl 4 mg daily at bedtime. Check blood sugars before meals and at bedtime  -follow for pattern 11. COPD. Continue Dulera and nebulizers as directed 12. UTI. Continue Rocephin for now base on +UA. Urine collected for cx yesterday and pending 13. Hypothyroidism. Thyroid 60 mg daily at bedtime. Latest TSH 0.285  LOS (Days) 3 A FACE TO FACE EVALUATION WAS PERFORMED  SWARTZ,ZACHARY T 02/29/2016 8:35 AM

## 2016-03-01 ENCOUNTER — Inpatient Hospital Stay (HOSPITAL_COMMUNITY): Payer: PPO | Admitting: Physical Therapy

## 2016-03-01 ENCOUNTER — Inpatient Hospital Stay (HOSPITAL_COMMUNITY): Payer: PPO

## 2016-03-01 DIAGNOSIS — I6200 Nontraumatic subdural hemorrhage, unspecified: Secondary | ICD-10-CM | POA: Diagnosis not present

## 2016-03-01 DIAGNOSIS — R569 Unspecified convulsions: Secondary | ICD-10-CM | POA: Diagnosis not present

## 2016-03-01 DIAGNOSIS — F09 Unspecified mental disorder due to known physiological condition: Secondary | ICD-10-CM | POA: Diagnosis not present

## 2016-03-01 DIAGNOSIS — S065X2S Traumatic subdural hemorrhage with loss of consciousness of 31 minutes to 59 minutes, sequela: Secondary | ICD-10-CM | POA: Diagnosis not present

## 2016-03-01 DIAGNOSIS — S0990XS Unspecified injury of head, sequela: Secondary | ICD-10-CM | POA: Diagnosis not present

## 2016-03-01 DIAGNOSIS — N390 Urinary tract infection, site not specified: Secondary | ICD-10-CM | POA: Diagnosis not present

## 2016-03-01 LAB — GLUCOSE, CAPILLARY
Glucose-Capillary: 107 mg/dL — ABNORMAL HIGH (ref 65–99)
Glucose-Capillary: 119 mg/dL — ABNORMAL HIGH (ref 65–99)
Glucose-Capillary: 124 mg/dL — ABNORMAL HIGH (ref 65–99)
Glucose-Capillary: 133 mg/dL — ABNORMAL HIGH (ref 65–99)
Glucose-Capillary: 134 mg/dL — ABNORMAL HIGH (ref 65–99)
Glucose-Capillary: 144 mg/dL — ABNORMAL HIGH (ref 65–99)

## 2016-03-01 MED ORDER — SODIUM CHLORIDE 0.45 % IV SOLN
INTRAVENOUS | Status: DC
  Administered 2016-03-01 – 2016-03-04 (×3): via INTRAVENOUS

## 2016-03-01 MED ORDER — INSULIN ASPART 100 UNIT/ML ~~LOC~~ SOLN
0.0000 [IU] | Freq: Three times a day (TID) | SUBCUTANEOUS | Status: DC
  Administered 2016-03-01 – 2016-03-06 (×10): 2 [IU] via SUBCUTANEOUS

## 2016-03-01 NOTE — Progress Notes (Signed)
Occupational Therapy Session Note  Patient Details  Name: Carolyn [name] MRN: [id number] Date of Birth: Jul 21, 1933  Today's Date: 03/01/2016 OT Individual Time: 1000-1040 OT Individual Time Calculation (min): 40 min    Short Term Goals: Week 1:  OT Short Term Goal 1 (Week 1): Pt will transfer to toilet with consistant supervision OT Short Term Goal 2 (Week 1): Pt will perform 3/3 toileting steps with mod A  OT Short Term Goal 3 (Week 1): Pt will perform LB dressing (underwear, pants, socks and shoes ) with mod A OT Short Term Goal 4 (Week 1): Pt will stand to complete 2 grooming tasks with min A with one UE support before needing to sit OT Short Term Goal 5 (Week 1): Pt will initiate tasks with min prompting in a 60 min session   Skilled Therapeutic Interventions/Progress Updates: Therapeutic exercises to improved right hand grip/pinch strength.   Pt received in bed after PT session.   Pt lethargic and refusing treatment planned (B&D), pulling away from therapist and shaking her head "no" when prompted to movement.  Pt eventually endorsed feeling cold and requested additional blanket.  With extra time and max multimodal cues, pt willing to perform right hand grip/pinch strengthening exercises as per note from her daughter, her right hand remains weak.   Pt completed 3 of 6 foam block exercises with hand-over-hand guidance to execute movement correctly.   Pt was unable to duplicate pad-to-pad pinches or thumb dig and terminated session early to roll to her left and sleep.  Pt attempts verbalizations with limited success articulating intelligible speech (< 50%).     Therapy Documentation Precautions:  Precautions Precautions: Fall Precaution Comments: fall, expressive aphasia, right hemiparesis Restrictions Weight Bearing Restrictions: No   General: General OT Amount of Missed Time: 20 Minutes   Pain: Pain Assessment Pain Assessment: No/denies pain Pain Score: 0-No pain    ADL: ADL ADL Comments: see functional navigator   Exercises: General Exercises - Upper Extremity Digit Composite Flexion: 20 reps;Right;Strengthening;Supine;Other (comment) (Foam Block exercises (blue and green blocks used)) Other Treatments:    See Function Navigator for Current Functional Status.   Therapy/Group: Individual Therapy  Franklyn Cafaro 03/01/2016, 10:47 AM

## 2016-03-01 NOTE — Progress Notes (Signed)
[place] PHYSICAL MEDICINE & REHABILITATION     PROGRESS NOTE    Subjective/Complaints: No new issues. Uneventful night per RN  ROS limited by aphasia and cognitive deficits.   Objective: Vital Signs: Blood pressure 149/70, pulse 60, temperature 98.4 F (36.9 C), temperature source Oral, resp. rate 18, height 5\' 6"  (1.676 m), weight 90.1 kg (198 lb 10.2 oz), last menstrual period 03/24/2010, SpO2 97 %. No results found. No results for input(s): WBC, HGB, HCT, PLT in the last 72 hours. No results for input(s): NA, K, CL, GLUCOSE, BUN, CREATININE, CALCIUM in the last 72 hours.  Invalid input(s): CO CBG (last 3)   Recent Labs  03/01/16 03/01/16 0406 03/01/16 0815  GLUCAP 124* 133* 107*    Wt Readings from Last 3 Encounters:  03/01/16 90.1 kg (198 lb 10.2 oz)  02/26/16 90.9 kg (200 lb 6.4 oz)  02/05/16 90.3 kg (199 lb 1.2 oz)    Physical Exam:  Constitutional: She appears well-developed and well-nourished.  HENT:  Craniotomy site clean and dry. Nasogastric tube in place  Eyes: Conjunctivae are normal.  Pupils reactive to light  Neck: Normal range of motion. Neck supple. No thyromegaly present.  Cardiovascular: Normal rate and regular rhythm.  Respiratory: Effort normal and breath sounds normal. No respiratory distress.  GI: Soft. Bowel sounds are normal. She exhibits no distension.  Musculoskeletal: She exhibits no edema or tenderness.  Neurological: She is alert.  Patient nonverbal DTRs symmetric Spontaneously moves right arm and leg. Is perseverative at times. Fairly alert. Delayed processing  Skin: Skin is warm and dry.  Psychiatric:  flat  Assessment/Plan: 1. 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 Physiatrist is providing close team supervision and 24 hour management of active medical problems listed below. Physiatrist and rehab  team continue to assess barriers to discharge/monitor patient progress toward functional and medical goals.  Function:  Bathing Bathing position   Position: Shower  Bathing parts Body parts bathed by patient: Right arm, Left arm, Chest, Abdomen, Right upper leg, Left upper leg, Buttocks, Front perineal area Body parts bathed by helper: Right lower leg, Left lower leg, Back  Bathing assist Assist Level: Touching or steadying assistance(Pt > 75%)      Upper Body Dressing/Undressing Upper body dressing   What is the patient wearing?: Pull over shirt/dress     Pull over shirt/dress - Perfomed by patient: Thread/unthread right sleeve, Thread/unthread left sleeve, Pull shirt over trunk Pull over shirt/dress - Perfomed by helper: Put head through opening        Upper body assist Assist Level: Touching or steadying assistance(Pt > 75%)   Set up : To obtain clothing/put away  Lower Body Dressing/Undressing Lower body dressing   What is the patient wearing?: Underwear, Non-skid slipper socks Underwear - Performed by patient: Pull underwear up/down Underwear - Performed by helper: Thread/unthread right underwear leg, Thread/unthread left underwear leg       Non-skid slipper socks- Performed by helper: Don/doff right sock, Don/doff left sock                  Lower body assist Assist for lower body dressing: Touching or steadying assistance (Pt > 75%)      Toileting Toileting Toileting activity did not occur: No continent bowel/bladder event Toileting steps completed by patient: Performs perineal hygiene, Adjust clothing prior to toileting Toileting steps completed by helper: Adjust clothing after toileting Toileting Assistive Devices:  Grab bar or rail  Toileting assist Assist level: More than reasonable time, Touching or steadying assistance (Pt.75%)   Transfers Chair/bed transfer   Chair/bed transfer method: Ambulatory Chair/bed transfer assist level: Moderate assist (Pt  50 - 74%/lift or lower) Chair/bed transfer assistive device: Armrests, Medical sales representative     Max distance: 35 ft Assist level: Touching or steadying assistance (Pt > 75%)   Wheelchair   Type: Manual   Assist Level: Dependent (Pt equals 0%)  Cognition Comprehension Comprehension assist level: Understands basic 90% of the time/cues < 10% of the time  Expression Expression assist level: Expresses basis less than 25% of the time/requires cueing >75% of the time.  Social Interaction Social Interaction assist level: Interacts appropriately 75 - 89% of the time - Needs redirection for appropriate language or to initiate interaction.  Problem Solving Problem solving assist level: Solves basic 25 - 49% of the time - needs direction more than half the time to initiate, plan or complete simple activities  Memory Memory assist level: Recognizes or recalls 25 - 49% of the time/requires cueing 50 - 75% of the time     Medical Problem List and Plan: 1. Right hemiparesthesias/aphasia/dysphasia secondary to traumatic subdural hematoma status post left craniotomy evacuated subdural hematoma with reaccumulation of fluid craniotomy 2  -continue CIR therapies   2. DVT Prophylaxis/Anticoagulation: SCDs. Monitor for any signs of DVT 3. Pain Management: Tylenol as needed 4. Dysphagia. Dysphagia #1 thin liquids. Nasogastric tube remains in place. Monitor for any signs of aspiration. Follow-up speech therapy  -will need to initiate better to meet PO needs  -consider megace trial/stimulant  -will add HS IVF 5. Neuropsych: This patient is not capable of making decisions on her own behalf. 6. Skin/Wound Care: Routine skin checks 7. Fluids/Electrolytes/Nutrition: Routine I&O with follow-up chemistries 8. Seizure disorder. Keppra 1250 mg every 12 hours.   -continue at current dose with sedation after dosing however.  9. Hypertension. Lisinopril 20 mg daily at bedtime, verapamil 40 mg 3 times a  day,Lopressor 25 mg BID.  -controlled at present 10. Diabetes mellitus. Hemoglobin A1c 6.7. Amaryl 4 mg daily at bedtime. Check blood sugars before meals and at bedtime  -good control at present 11. COPD. Continue Dulera and nebulizers as directed 12. UTI. Continue Rocephin for now base on +UA. Urine collected for cx yesterday and pending 13. Hypothyroidism. Thyroid 60 mg daily at bedtime. Latest TSH 0.285  LOS (Days) 4 A FACE TO FACE EVALUATION WAS PERFORMED  Carolyn [name] T 03/01/2016 8:30 AM

## 2016-03-01 NOTE — Progress Notes (Signed)
Physical Therapy Note  Patient Details  Name: Carolyn [name] MRN: [id number] Date of Birth: 04-27-1933 Today's Date: 03/01/2016    Attempted to see pt on this date, at 23 pm, to make up missed time. Pt's family reports pt has only been resting for ~30 minutes & pt adamantly refused to participate in even bed level exercises.   Waunita Schooner 03/01/2016, 5:14 PM

## 2016-03-01 NOTE — Progress Notes (Addendum)
Physical Therapy Session Note  Patient Details  Name: Carolyn [name] MRN: [id number] Date of Birth: September 18, 1933  Today's Date: 03/01/2016 PT Individual Time: 0800-0930 and 1300-1345 PT Individual Time Calculation (min): 90 min and 45 min  Short Term Goals: Week 1:  PT Short Term Goal 1 (Week 1): Patient will ambulate 50 ft using RW with supervision. PT Short Term Goal 2 (Week 1): Patient will negotiate 2 (6") stairs using 2 rails with min A.  PT Short Term Goal 3 (Week 1): Patient will maintain standing balance x 3 min with supervision.  PT Short Term Goal 4 (Week 1): Patient will perform bed mobility with consistent supervision.  Skilled Therapeutic Interventions/Progress Updates:   Treatment 1: Patient asleep in bed, easily aroused but requiring increased time and max encouragement for OOB. After ~15 min, patient sat edge of bed with supervision but refused to leave room. Patient transferred to recliner using RW with supervision and uncontrolled descent as she kept both UE on RW despite cues to control descent with UE. Patient set up with breakfast meal tray with yes/no responses with 80% accuracy. Patient repeatedly using RUE for picking up drinks and using utensil but spilled entire glass of milk due to impaired strength/coordination/decreased RUE attention and required max cues for problem solving use of LUE and using RUE as gross assist to successfully complete eating tasks. Patient easily frustrated when attempting to communicate with PT. Patient ambulated short distance to sink using RW to brush teeth from wheelchair. Patient required max-total multimodal cues to attend to RUE and for problem solving how to open tooth paste and brush teeth with closed fist R hand, able to open R hand on command. Gait training using RW 2 x 50 ft with supervision, decreased R foot clearance and slow gait speed. Stair training up/down 4 (6") stairs using 2 rails with min A overall, self-elected step-to pattern.  Seated in wheelchair, worked on RUE coordination and attention to task placing large pegs into peg board and returning to basket using R hand only. Patient stated she needed to use bathroom upon returning to room and ambulated to toilet using RW with supervision. Patient left sitting on toilet with RN present.   Treatment 2: Patient in bed with daughter and husband present. Patient required increased time and max encouragement to participate, but eventually sat edge of bed with supervision and transferred to recliner using RW despite cues to ambulate from room. Patient stood with minimal coaxing and ambulated 100 ft to [employer] using RW with supervision. Engaged in [place] mode A without UE support to challenge R attention and functional use of RUE x 3 trials = 13 hits and 4.62 sec avg reaction time, 24 hits and 2.5 sec avg reaction time, and 15 hits and 4 sec avg reaction time. Patient able to tolerate standing up to 60 sec at a time with supervision before needing seated rest break. Patient ambulated back to room using RW with supervision, transferred sit > supine with HOB raised and use of rail with supervision and left semi reclined with needs in reach and family at bedside. Daughter educated on decreased attention and RUE weakness/coordination, encouraged to assist patient with R hand exercises from handout provided by OT earlier.   Therapy Documentation Precautions:  Precautions Precautions: Fall Precaution Comments: fall, expressive aphasia, right hemiparesis Restrictions Weight Bearing Restrictions: No Pain: Pain Assessment Pain Assessment: No/denies pain   See Function Navigator for Current Functional Status.   Therapy/Group: Individual Therapy  Carney Living  A 03/01/2016, 9:23 AM

## 2016-03-02 ENCOUNTER — Inpatient Hospital Stay (HOSPITAL_COMMUNITY): Payer: PPO | Admitting: Speech Pathology

## 2016-03-02 ENCOUNTER — Inpatient Hospital Stay (HOSPITAL_COMMUNITY): Payer: PPO

## 2016-03-02 ENCOUNTER — Inpatient Hospital Stay (HOSPITAL_COMMUNITY): Payer: PPO | Admitting: Physical Therapy

## 2016-03-02 DIAGNOSIS — F09 Unspecified mental disorder due to known physiological condition: Secondary | ICD-10-CM | POA: Diagnosis not present

## 2016-03-02 DIAGNOSIS — S0990XS Unspecified injury of head, sequela: Secondary | ICD-10-CM | POA: Diagnosis not present

## 2016-03-02 DIAGNOSIS — S065X2S Traumatic subdural hemorrhage with loss of consciousness of 31 minutes to 59 minutes, sequela: Secondary | ICD-10-CM | POA: Diagnosis not present

## 2016-03-02 DIAGNOSIS — N390 Urinary tract infection, site not specified: Secondary | ICD-10-CM | POA: Diagnosis not present

## 2016-03-02 DIAGNOSIS — R569 Unspecified convulsions: Secondary | ICD-10-CM | POA: Diagnosis not present

## 2016-03-02 DIAGNOSIS — I6200 Nontraumatic subdural hemorrhage, unspecified: Secondary | ICD-10-CM | POA: Diagnosis not present

## 2016-03-02 LAB — GLUCOSE, CAPILLARY
Glucose-Capillary: 158 mg/dL — ABNORMAL HIGH (ref 65–99)
Glucose-Capillary: 135 mg/dL — ABNORMAL HIGH (ref 65–99)
Glucose-Capillary: 135 mg/dL — ABNORMAL HIGH (ref 65–99)
Glucose-Capillary: 138 mg/dL — ABNORMAL HIGH (ref 65–99)
Glucose-Capillary: 149 mg/dL — ABNORMAL HIGH (ref 65–99)
Glucose-Capillary: 128 mg/dL — ABNORMAL HIGH (ref 65–99)
Glucose-Capillary: 111 mg/dL — ABNORMAL HIGH (ref 65–99)
Glucose-Capillary: 160 mg/dL — ABNORMAL HIGH (ref 65–99)

## 2016-03-02 LAB — BASIC METABOLIC PANEL
Anion gap: 9 (ref 5–15)
BUN: 11 mg/dL (ref 6–20)
CO2: 27 mmol/L (ref 22–32)
Calcium: 9.5 mg/dL (ref 8.9–10.3)
Chloride: 103 mmol/L (ref 101–111)
Creatinine, Ser: 0.54 mg/dL (ref 0.44–1.00)
GFR calc Af Amer: >60 mL/min (ref >60)
GFR calc non Af Amer: >60 mL/min (ref >60)
Glucose, Bld: 158 mg/dL — ABNORMAL HIGH (ref 65–99)
Potassium: 4.2 mmol/L (ref 3.5–5.1)
Sodium: 139 mmol/L (ref 135–145)

## 2016-03-02 LAB — PREALBUMIN: Prealbumin: 25.4 mg/dL (ref 18–38)

## 2016-03-02 MED ORDER — FLUCONAZOLE 40 MG/ML PO SUSR
100.0000 mg | Freq: Every day | ORAL | Status: DC
  Administered 2016-03-02 – 2016-03-06 (×5): 100 mg
  Filled 2016-03-02 (×7): qty 2.5

## 2016-03-02 NOTE — Progress Notes (Signed)
[facility]     PROGRESS NOTE    Subjective/Complaints: Pt slept. Denies pain. Appears comfortable  ROS limited by aphasia and cognitive deficits.   Objective: Vital Signs: Blood pressure 141/82, pulse 71, temperature 98.2 F (36.8 C), temperature source Oral, resp. rate 19, height 5\' 6"  (1.676 m), weight 90.1 kg (198 lb 10.2 oz), last menstrual period 03/24/2010, SpO2 97 %. No results found. No results for input(s): WBC, HGB, HCT, PLT in the last 72 hours.  Recent Labs  03/02/16 0431  NA 139  K 4.2  CL 103  GLUCOSE 158*  BUN 11  CREATININE 0.54  CALCIUM 9.5   CBG (last 3)   Recent Labs  03/01/16 1623 03/01/16 2133 03/02/16 0640  GLUCAP 119* 134* 158*    Wt Readings from Last 3 Encounters:  03/01/16 90.1 kg (198 lb 10.2 oz)  02/26/16 90.9 kg (200 lb 6.4 oz)  02/05/16 90.3 kg (199 lb 1.2 oz)    Physical Exam:  Constitutional: She appears well-developed and well-nourished.  HENT:  Craniotomy site clean and dry. Nasogastric tube in place  Eyes: Conjunctivae are normal.  Pupils reactive to light  Neck: Normal range of motion. Neck supple. No thyromegaly present.  Cardiovascular: Normal rate and regular rhythm.  Respiratory: Effort normal and breath sounds normal. No respiratory distress.  GI: Soft. Bowel sounds are normal. She exhibits no distension.  Musculoskeletal: She exhibits no edema or tenderness.  Neurological: She is alert. Responds to Y/N questions with 50%+ accuracy. Tries to express needs, speech often garbled/soft DTRs symmetric Spontaneously moves right arm and leg. Is perseverative at times. Fairly alert. Delayed processing  Skin: Skin is warm and dry.  Psychiatric:  flat  Assessment/Plan: 1. 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 Physiatrist is providing close team supervision and  24 hour management of active medical problems listed below. Physiatrist and rehab team continue to assess barriers to discharge/monitor patient progress toward functional and medical goals.  Function:  Bathing Bathing position   Position: Shower  Bathing parts Body parts bathed by patient: Right arm, Left arm, Chest, Abdomen, Right upper leg, Left upper leg, Buttocks, Front perineal area Body parts bathed by helper: Right lower leg, Left lower leg, Back  Bathing assist Assist Level: Touching or steadying assistance(Pt > 75%)      Upper Body Dressing/Undressing Upper body dressing   What is the patient wearing?: Pull over shirt/dress     Pull over shirt/dress - Perfomed by patient: Thread/unthread right sleeve, Thread/unthread left sleeve, Pull shirt over trunk Pull over shirt/dress - Perfomed by helper: Put head through opening        Upper body assist Assist Level: Touching or steadying assistance(Pt > 75%)   Set up : To obtain clothing/put away  Lower Body Dressing/Undressing Lower body dressing   What is the patient wearing?: Underwear, Non-skid slipper socks Underwear - Performed by patient: Pull underwear up/down Underwear - Performed by helper: Thread/unthread right underwear leg, Thread/unthread left underwear leg       Non-skid slipper socks- Performed by helper: Don/doff right sock, Don/doff left sock                  Lower body assist Assist for lower body dressing: Touching or steadying assistance (Pt > 75%)      Toileting Toileting Toileting activity did not occur: No continent bowel/bladder event Toileting steps completed by patient:  Performs perineal hygiene, Adjust clothing prior to toileting Toileting steps completed by helper: Adjust clothing after toileting Toileting Assistive Devices: Grab bar or rail  Toileting assist Assist level: Touching or steadying assistance (Pt.75%)   Transfers Chair/bed transfer   Chair/bed transfer method: Stand  pivot, Ambulatory Chair/bed transfer assist level: Supervision or verbal cues Chair/bed transfer assistive device: Walker, Air cabin crew     Max distance: 50 ft Assist level: Supervision or verbal cues   Wheelchair   Type: Manual   Assist Level: Dependent (Pt equals 0%)  Cognition Comprehension Comprehension assist level: Understands basic 90% of the time/cues < 10% of the time  Expression Expression assist level: Expresses basis less than 25% of the time/requires cueing >75% of the time.  Social Interaction Social Interaction assist level: Interacts appropriately 75 - 89% of the time - Needs redirection for appropriate language or to initiate interaction.  Problem Solving Problem solving assist level: Solves basic 25 - 49% of the time - needs direction more than half the time to initiate, plan or complete simple activities  Memory Memory assist level: Recognizes or recalls 25 - 49% of the time/requires cueing 50 - 75% of the time     Medical Problem List and Plan: 1. Right hemiparesthesias/aphasia/dysphasia secondary to traumatic subdural hematoma status post left craniotomy evacuated subdural hematoma with reaccumulation of fluid craniotomy 2  -continue CIR therapies   2. DVT Prophylaxis/Anticoagulation: SCDs. Monitor for any signs of DVT 3. Pain Management: Tylenol as needed 4. Dysphagia. Dysphagia #1 thin liquids. Nasogastric tube remains in place. Monitor for any signs of aspiration. Follow-up speech therapy  -will need to initiate better to meet PO needs  -consider megace trial/stimulant  -continue HS IVF 5. Neuropsych: This patient is not capable of making decisions on her own behalf. 6. Skin/Wound Care: Routine skin checks 7. Fluids/Electrolytes/Nutrition: Routine I&O with follow-up chemistries 8. Seizure disorder. Keppra 1250 mg every 12 hours.   -continue at current dose with sedation after dosing however.  9. Hypertension. Lisinopril 20 mg daily  at bedtime, verapamil 40 mg 3 times a day,Lopressor 25 mg BID.  -controlled at present 10. Diabetes mellitus. Hemoglobin A1c 6.7. Amaryl 4 mg daily at bedtime. Check blood sugars before meals and at bedtime  -good control at present 11. COPD. Continue Dulera and nebulizers as directed 12. UTI. 100k yeast---change to diflucan, dc ctx 13. Hypothyroidism. Thyroid 60 mg daily at bedtime. Latest TSH 0.285  LOS (Days) 5 A FACE TO FACE EVALUATION WAS PERFORMED  Carolyn [name] T 03/02/2016 8:19 AM

## 2016-03-02 NOTE — Progress Notes (Signed)
Physical Therapy Session Note  Patient Details  Name: Carolyn [name] MRN: [id number] Date of Birth: 16-Sep-1933  Today's Date: 03/02/2016 PT Individual Time: [id number] PT Individual Time Calculation (min): 60 min   Short Term Goals: Week 1:  PT Short Term Goal 1 (Week 1): Patient will ambulate 50 ft using RW with supervision. PT Short Term Goal 2 (Week 1): Patient will negotiate 2 (6") stairs using 2 rails with min A.  PT Short Term Goal 3 (Week 1): Patient will maintain standing balance x 3 min with supervision.  PT Short Term Goal 4 (Week 1): Patient will perform bed mobility with consistent supervision.  Skilled Therapeutic Interventions/Progress Updates:   Patient asleep in bed, easily aroused and nodding "yes" when questioned if she needs to use bathroom. Patient sat edge of bed with increased time and ambulated to toilet using RW and performed toileting tasks with supervision, cues to attend to RUE with hygiene due to dropping toilet paper. Patient stood at sink for hand hygiene with min directional cues for sequencing and functional use RUE. Gait training using RW x 180 ft with supervision and slow pace. Patient instructed in bimanual task of PVC pipetree puzzle of mild difficulty in standing with pre-selected pieces and max multimodal cues for error identification and correction and max verbal/visual cues to attend to RUE and open/close hand to complete task. Patient completed second pipetree puzzle of mild difficulty in sitting with min multimodal cues for error identification and able to correct with supervision. Performed NuStep using BUE/BLE at level 4 > 2 x 10 min for strengthening and activity tolerance and min cues to attend to RUE placement on hand grip. Patient requested to wash hands after returning to room, stood at sink with min cues for RUE and then requested to return to bed. Patient left supine with all needs in reach, 4 rails up per family request, and bed alarm on.   Therapy  Documentation Precautions:  Precautions Precautions: Fall Precaution Comments: fall, expressive aphasia, right hemiparesis Restrictions Weight Bearing Restrictions: No Pain: Pain Assessment Pain Assessment: No/denies pain  See Function Navigator for Current Functional Status.   Therapy/Group: Individual Therapy  Laretta Alstrom 03/02/2016, 3:52 PM

## 2016-03-02 NOTE — Progress Notes (Signed)
Speech Language Pathology Daily Session Note  Patient Details  Name: Carolyn [name] MRN: [id number] Date of Birth: 1933-01-11  Today's Date: 03/02/2016 SLP Individual Time: 0900-1000 SLP Individual Time Calculation (min): 60 min  Short Term Goals: Week 1: SLP Short Term Goal 1 (Week 1): Pt will name familiar objects with Min A verbal cues with 90%.  SLP Short Term Goal 2 (Week 1): Pt will increase bolus management of dysphagia 2 trials as evidenced by timely oral prep and complete oral clearing with Mod A verbal cues.  SLP Short Term Goal 3 (Week 1): Pt will demonstrate sustained attention for 5 minutes with Mod I.  SLP Short Term Goal 4 (Week 1): Pt will imitate simple automatic phrases with 90% and Mod A verbal cues.  SLP Short Term Goal 5 (Week 1): Pt will demonstrate basic problem solving for familiar tasks with Min A verbal cues.  SLP Short Term Goal 6 (Week 1): Patient will utilize an increased vocal intensity to maximize intelligigibility to ~90% at the word level with Min A verbal cues.   Skilled Therapeutic Interventions: Skilled treatment session focused on functional communication and dysphagia goals. Patient demonstrating appropriate reading comprehension at the phrase level by matching a written function to an object from a field of 2 with 100% accuracy. Patient also named functional items with 100% accuracy but required Mod-Max A verbal cues for utilization of over-articulation and an increased vocal intensity to maximize intelligibility at the word and phrase level. SLP also facilitated session by administering trials of Dys. 2 and Dys. 3 textures. Patient demonstrated efficient mastication with complete oral clearance with multiple swallows and Min A verbal cues. Recommend trial tray prior to upgrade. Patient left upright in bed with family present and all needs within reach. Continue with current plan of care.    Function:  Eating Eating Eating activity did not occur:  N/A Modified Consistency Diet: Yes Eating Assist Level: Set up assist for;Supervision or verbal cues;Helper checks for pocketed food   Eating Set Up Assist For: Parenteral or tube feed supplies       Cognition Comprehension Comprehension assist level: Understands basic 90% of the time/cues < 10% of the time  Expression   Expression assist level: Expresses basis less than 25% of the time/requires cueing >75% of the time.  Social Interaction Social Interaction assist level: Interacts appropriately 75 - 89% of the time - Needs redirection for appropriate language or to initiate interaction.  Problem Solving Problem solving assist level: Solves basic 25 - 49% of the time - needs direction more than half the time to initiate, plan or complete simple activities  Memory Memory assist level: Recognizes or recalls 25 - 49% of the time/requires cueing 50 - 75% of the time    Pain Pain Assessment Pain Assessment: No/denies pain  Therapy/Group: Individual Therapy  Liliann File 03/02/2016, 4:03 PM

## 2016-03-02 NOTE — Progress Notes (Signed)
Occupational Therapy Note  Patient Details  Name: ATALEE CALIP MRN: [id number] Date of Birth: 07-18-1933  Today's Date: 03/02/2016 OT Individual Time: 1300-1330 OT Individual Time Calculation (min): 30 min   Pt denied pain Individual Therapy  Pt resting in bed upon arrival and required min encouragement to participate in therapy.  Pt sat EOB with supervision.  Pt engaged in table top tasks sitting EOB with focus on attention to her right and use of right hand to grasp large and small pegs and place in peg holes.  Pt completed tasks with 75% success with min verbal cues to attend to her right hand to grasp pegs.  Discussed with patient the importance of looking at her right hand when using for task.  Pt's daughter was present and verbalized understanding.  Pt requested use of toilet at end of session and amb with RW (steady A) to toilet.  Pt remained in bathroom with daughter present and instructions to call staff when finished.  Pt's RN notified of pt's status.    Leotis Shames [facility] 03/02/2016, 2:54 PM

## 2016-03-02 NOTE — Progress Notes (Signed)
No acute events Neurologically improving Incision looks good I think she will benefit from getting the dobhoff tube out Hopefully that can happen in the next several days

## 2016-03-02 NOTE — Progress Notes (Signed)
Occupational Therapy Session Note  Patient Details  Name: Carolyn [name] MRN: [id number] Date of Birth: 1933/08/26  Today's Date: 03/02/2016 OT Individual Time: 1100-1200 OT Individual Time Calculation (min): 60 min    Short Term Goals: Week 1:  OT Short Term Goal 1 (Week 1): Pt will transfer to toilet with consistant supervision OT Short Term Goal 2 (Week 1): Pt will perform 3/3 toileting steps with mod A  OT Short Term Goal 3 (Week 1): Pt will perform LB dressing (underwear, pants, socks and shoes ) with mod A OT Short Term Goal 4 (Week 1): Pt will stand to complete 2 grooming tasks with min A with one UE support before needing to sit OT Short Term Goal 5 (Week 1): Pt will initiate tasks with min prompting in a 60 min session   Skilled Therapeutic Interventions/Progress Updates:    Pt resting EOB upon arrival with daughter and RN present.  Pt requested use of toilet and amb with RW to bathroom.  Pt required max A to avoid obstacles on left.  Pt exhibited right inattention when ambulating.  Pt dropped toilet paper three times when attempting to perform toilet hygiene.  Pt completed bathing at shower level with sit<>stand from tub bench.  Pt dropped the wash cloth X 2 when bathing.  Pt required increased assistance donning her housecoat this morning but was able to don her underpants.  Pt continues to exhibit improvement with verbalizing thoughts and needs.  Pt remained in recliner with daughter and son present.  Focus on activity tolerance, sit<>stand, standing balance, attention to right, and safety awareness to increase independence with BADLs.  Therapy Documentation Precautions:  Precautions Precautions: Fall Precaution Comments: fall, expressive aphasia, right hemiparesis Restrictions Weight Bearing Restrictions: No  Pain: Pain Assessment Pain Assessment: No/denies pain Pain Score: 0-No pain ADL: ADL ADL Comments: see functional navigator  See Function Navigator for Current  Functional Status.   Therapy/Group: Individual Therapy  Leroy Libman 03/02/2016, 12:04 PM

## 2016-03-03 ENCOUNTER — Inpatient Hospital Stay (HOSPITAL_COMMUNITY): Payer: PPO | Admitting: Speech Pathology

## 2016-03-03 ENCOUNTER — Inpatient Hospital Stay (HOSPITAL_COMMUNITY): Payer: PPO | Admitting: Physical Therapy

## 2016-03-03 ENCOUNTER — Inpatient Hospital Stay (HOSPITAL_COMMUNITY): Payer: PPO

## 2016-03-03 DIAGNOSIS — R1310 Dysphagia, unspecified: Secondary | ICD-10-CM | POA: Diagnosis not present

## 2016-03-03 DIAGNOSIS — R569 Unspecified convulsions: Secondary | ICD-10-CM | POA: Diagnosis not present

## 2016-03-03 DIAGNOSIS — N390 Urinary tract infection, site not specified: Secondary | ICD-10-CM | POA: Diagnosis not present

## 2016-03-03 DIAGNOSIS — S065X2S Traumatic subdural hemorrhage with loss of consciousness of 31 minutes to 59 minutes, sequela: Secondary | ICD-10-CM | POA: Diagnosis not present

## 2016-03-03 LAB — GLUCOSE, CAPILLARY
Glucose-Capillary: 133 mg/dL — ABNORMAL HIGH (ref 65–99)
Glucose-Capillary: 125 mg/dL — ABNORMAL HIGH (ref 65–99)
Glucose-Capillary: 148 mg/dL — ABNORMAL HIGH (ref 65–99)
Glucose-Capillary: 111 mg/dL — ABNORMAL HIGH (ref 65–99)

## 2016-03-03 MED ORDER — GLUCERNA 1.2 CAL PO LIQD
1000.0000 mL | ORAL | Status: DC
  Administered 2016-03-03 (×2): 1000 mL
  Filled 2016-03-03 (×4): qty 1000

## 2016-03-03 MED ORDER — PRO-STAT SUGAR FREE PO LIQD
30.0000 mL | Freq: Every day | ORAL | Status: DC
  Administered 2016-03-03 – 2016-03-04 (×2): 30 mL
  Filled 2016-03-03 (×2): qty 30

## 2016-03-03 NOTE — Progress Notes (Signed)
Speech Language Pathology Daily Session Note  Patient Details  Name: Carolyn [name] MRN: [id number] Date of Birth: 1933/06/28  Today's Date: 03/03/2016 SLP Individual Time: 0800-0900 SLP Individual Time Calculation (min): 60 min  Short Term Goals: Week 1: SLP Short Term Goal 1 (Week 1): Pt will name familiar objects with Min A verbal cues with 90%.  SLP Short Term Goal 2 (Week 1): Pt will increase bolus management of dysphagia 2 trials as evidenced by timely oral prep and complete oral clearing with Mod A verbal cues.  SLP Short Term Goal 3 (Week 1): Pt will demonstrate sustained attention for 5 minutes with Mod I.  SLP Short Term Goal 4 (Week 1): Pt will imitate simple automatic phrases with 90% and Mod A verbal cues.  SLP Short Term Goal 5 (Week 1): Pt will demonstrate basic problem solving for familiar tasks with Min A verbal cues.  SLP Short Term Goal 6 (Week 1): Patient will utilize an increased vocal intensity to maximize intelligigibility to ~90% at the word level with Min A verbal cues.   Skilled Therapeutic Interventions: Skilled treatment session focused on dysphagia and language goals. SLP facilitated session by providing skilled observation with breakfast meal of Dys. 2 textures with thin liquids. Patient consumed meal without overt s/s of aspiration and demonstrated efficient mastication and required supervision verbal cues to clear her oral cavity and self-monitor and correct right buccal pocketing. Recommend patient upgrade to Dys. 2 textures. Patient also consumed trials of Dys. 3 textures with efficient mastication without overt s/s of aspiration. SLP also facilitated session by providing extra time and Max A multimodal cues for use of speech intelligibility strategies to maximize her intelligibility to ~50% at the phrase level. Patient demonstrated sustained attention to tasks for 30 minutes with Min A verbal cues for redirection. Patient left upright in bed with all needs within  reach. Continue with current plan of care.    Function:  Eating Eating Eating activity did not occur: N/A Modified Consistency Diet: Yes Eating Assist Level: Set up assist for;Supervision or verbal cues;Helper checks for pocketed food   Eating Set Up Assist For: Opening containers;Parenteral or tube feed supplies       Cognition Comprehension Comprehension assist level: Understands basic 90% of the time/cues < 10% of the time  Expression   Expression assist level: Expresses basis less than 25% of the time/requires cueing >75% of the time.  Social Interaction Social Interaction assist level: Interacts appropriately 75 - 89% of the time - Needs redirection for appropriate language or to initiate interaction.  Problem Solving Problem solving assist level: Solves basic 75 - 89% of the time/requires cueing 10 - 24% of the time  Memory Memory assist level: Recognizes or recalls 75 - 89% of the time/requires cueing 10 - 24% of the time    Pain Pain Assessment Pain Assessment: No/denies pain  Therapy/Group: Individual Therapy  Kindel Rochefort 03/03/2016, 4:07 PM

## 2016-03-03 NOTE — Progress Notes (Signed)
Nutrition Follow-up  DOCUMENTATION CODES:   Obesity unspecified  INTERVENTION:  Initiate nocturnal tube feeds of Glucerna 1.2 formula via Cortrak NGT at new goal rate of 80 ml/hr x 12 hours (1900-0700) daily with 30 ml Prostat once daily to provide 1252 kcal (78% of kcal needs), 73 grams of protein (91% of protein needs), and 778 ml of free water.   Continue Ensure Enlive po BID, each supplement provides 350 kcal and 20 grams of protein.  Encourage adequate PO intake.   NUTRITION DIAGNOSIS:   Inadequate oral intake related to poor appetite as evidenced by meal completion < 50%; ongoing  GOAL:   Patient will meet greater than or equal to 90% of their needs; met  MONITOR:   PO intake, Supplement acceptance, Diet advancement, Weight trends, Labs, I & O's  REASON FOR ASSESSMENT:   Consult Enteral/tube feeding initiation and management  ASSESSMENT:   Carolyn [name] is a 82 y.o. right handed female with history of hypertension, diabetes mellitus, COPD. Patient with with history of subdural hematoma after multiple falls beginning in March 2017 as well as left radial head fracture. Presents with Traumatic Subdural Hematoma  Meal completion has been advanced to a dysphagia 2 diet. Meal completion has been 25%. Pt has Ensure ordered and recently has been refusing most of them. Family at bedside would like nocturnal tube feedings adjusted to a shorter infusion time thus so pt can consume her food at dinner time. RD to modify tube feeding to 12 hours instead of 14 hours previously. Tube feeding to run 7pm-7am. RD to order Prostat to ensure protein needs are met. Pt encouraged to eat her food at meals. RD to continue to monitor.   Labs and medications reviewed.   Diet Order:  DIET DYS 2 Room service appropriate?: Yes; Fluid consistency:: Thin  Skin:   (Incision L head)  Last BM:  4/17  Height:   Ht Readings from Last 1 Encounters:  02/26/16 '5\' 6"'$  (1.676 m)    Weight:   Wt  Readings from Last 1 Encounters:  03/03/16 196 lb 3.2 oz (88.996 kg)    Ideal Body Weight:  59 kg  BMI:  Body mass index is 31.68 kg/(m^2).  Estimated Nutritional Needs:   Kcal:  1600-1800  Protein:  80-90 grams  Fluid:  1.6 - 1.8 L/day  EDUCATION NEEDS:   No education needs identified at this time  Corrin Parker, MS, RD, LDN Pager # [phone] After hours/ weekend pager # [phone]

## 2016-03-03 NOTE — Progress Notes (Signed)
[facility] PHYSICAL MEDICINE & REHABILITATION     PROGRESS NOTE    Subjective/Complaints: Pt alert. No distress. No problems reported by RN.  ROS limited by aphasia and cognitive deficits.   Objective: Vital Signs: Blood pressure 152/65, pulse 67, temperature 97.8 F (36.6 C), temperature source Oral, resp. rate 18, height 5\' 6"  (1.676 m), weight 88.996 kg (196 lb 3.2 oz), last menstrual period 03/24/2010, SpO2 96 %. No results found. No results for input(s): WBC, HGB, HCT, PLT in the last 72 hours.  Recent Labs  03/02/16 0431  NA 139  K 4.2  CL 103  GLUCOSE 158*  BUN 11  CREATININE 0.54  CALCIUM 9.5   CBG (last 3)   Recent Labs  03/02/16 1702 03/02/16 2058 03/03/16 0708  GLUCAP 111* 160* 133*    Wt Readings from Last 3 Encounters:  03/03/16 88.996 kg (196 lb 3.2 oz)  02/26/16 90.9 kg (200 lb 6.4 oz)  02/05/16 90.3 kg (199 lb 1.2 oz)    Physical Exam:  Constitutional: She appears well-developed and well-nourished.  HENT:  Craniotomy site clean and dry. Nasogastric tube in place  Eyes: Conjunctivae are normal.  Pupils reactive to light  Neck: Normal range of motion. Neck supple. No thyromegaly present.  Cardiovascular: Normal rate and regular rhythm.  Respiratory: Effort normal and breath sounds normal. No respiratory distress.  GI: Soft. Bowel sounds are normal. She exhibits no distension.  Musculoskeletal: She exhibits no edema or tenderness.  Neurological: She is alert. Responds to Y/N questions with 50%+ accuracy. Tries to express needs, speech often garbled/soft DTRs symmetric Spontaneously moves right arm and leg. Is perseverative at times. Fairly alert. Delayed processing  Skin: Skin is warm and dry.  Psychiatric:  flat  Assessment/Plan: 1. 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 Physiatrist is providing close team  supervision and 24 hour management of active medical problems listed below. Physiatrist and rehab team continue to assess barriers to discharge/monitor patient progress toward functional and medical goals.  Function:  Bathing Bathing position   Position: Shower  Bathing parts Body parts bathed by patient: Right arm, Left arm, Chest, Abdomen, Right upper leg, Left upper leg, Buttocks, Front perineal area Body parts bathed by helper: Right lower leg, Left lower leg  Bathing assist Assist Level: Touching or steadying assistance(Pt > 75%)      Upper Body Dressing/Undressing Upper body dressing   What is the patient wearing?: Button up shirt     Pull over shirt/dress - Perfomed by patient: Thread/unthread right sleeve, Thread/unthread left sleeve, Pull shirt over trunk Pull over shirt/dress - Perfomed by helper: Put head through opening Button up shirt - Perfomed by patient: Thread/unthread left sleeve      Upper body assist Assist Level: Touching or steadying assistance(Pt > 75%)   Set up : To obtain clothing/put away  Lower Body Dressing/Undressing Lower body dressing   What is the patient wearing?: Underwear, Non-skid slipper socks Underwear - Performed by patient: Thread/unthread right underwear leg, Thread/unthread left underwear leg, Pull underwear up/down Underwear - Performed by helper: Thread/unthread right underwear leg, Thread/unthread left underwear leg       Non-skid slipper socks- Performed by helper: Don/doff right sock, Don/doff left sock                  Lower body assist Assist for lower body dressing: Touching or steadying assistance (Pt > 75%)  Toileting Toileting   Toileting steps completed by patient: Performs perineal hygiene, Adjust clothing after toileting, Adjust clothing prior to toileting Toileting steps completed by helper: Adjust clothing prior to toileting Toileting Assistive Devices: Grab bar or rail  Toileting assist Assist level:  Supervision or verbal cues   Transfers Chair/bed transfer   Chair/bed transfer method: Ambulatory Chair/bed transfer assist level: Supervision or verbal cues Chair/bed transfer assistive device: Walker, Air cabin crew     Max distance: 180 ft Assist level: Supervision or verbal cues   Wheelchair   Type: Manual   Assist Level: Dependent (Pt equals 0%)  Cognition Comprehension Comprehension assist level: Understands basic 90% of the time/cues < 10% of the time  Expression Expression assist level: Expresses basis less than 25% of the time/requires cueing >75% of the time.  Social Interaction Social Interaction assist level: Interacts appropriately 75 - 89% of the time - Needs redirection for appropriate language or to initiate interaction.  Problem Solving Problem solving assist level: Solves basic 25 - 49% of the time - needs direction more than half the time to initiate, plan or complete simple activities  Memory Memory assist level: Recognizes or recalls 25 - 49% of the time/requires cueing 50 - 75% of the time     Medical Problem List and Plan: 1. Right hemiparesthesias/aphasia/dysphasia secondary to traumatic subdural hematoma status post left craniotomy evacuated subdural hematoma with reaccumulation of fluid craniotomy 2  -continue CIR therapies  -team conference today   2. DVT Prophylaxis/Anticoagulation: SCDs. Monitor for any signs of DVT 3. Pain Management: Tylenol as needed 4. Dysphagia. Now on Dysphagia #2 thin liquids. Nasogastric tube remains in place.   -slightly better po intake yesterday  -consider megace trial/stimulant. Perhaps diet upgrade will help also  -continue HS IVF 5. Neuropsych: This patient is not capable of making decisions on her own behalf. 6. Skin/Wound Care: Routine skin checks 7. Fluids/Electrolytes/Nutrition: Routine I&O with follow-up chemistries 8. Seizure disorder. Keppra 1250 mg every 12 hours.   -continue at current  dose with sedation after dosing however.  9. Hypertension. Lisinopril 20 mg daily at bedtime, verapamil 40 mg 3 times a day,Lopressor 25 mg BID.  -controlled at present 10. Diabetes mellitus. Hemoglobin A1c 6.7. Amaryl 4 mg daily at bedtime. Check blood sugars before meals and at bedtime  -good control at present 11. COPD. Continue Dulera and nebulizers as directed 12. UTI. 100k yeast---changed to diflucan---7 day course 13. Hypothyroidism. Thyroid 60 mg daily at bedtime. Latest TSH 0.285  LOS (Days) 6 A FACE TO FACE EVALUATION WAS PERFORMED  Hallelujah Wysong T 03/03/2016 8:36 AM

## 2016-03-03 NOTE — Progress Notes (Signed)
Occupational Therapy Session Note  Patient Details  Name: Carolyn [name] MRN: [id number] Date of Birth: 06-21-1933  Today's Date: 03/03/2016 OT Individual Time: 1000-1100 OT Individual Time Calculation (min): 60 min    Short Term Goals: Week 1:  OT Short Term Goal 1 (Week 1): Pt will transfer to toilet with consistant supervision OT Short Term Goal 2 (Week 1): Pt will perform 3/3 toileting steps with mod A  OT Short Term Goal 3 (Week 1): Pt will perform LB dressing (underwear, pants, socks and shoes ) with mod A OT Short Term Goal 4 (Week 1): Pt will stand to complete 2 grooming tasks with min A with one UE support before needing to sit OT Short Term Goal 5 (Week 1): Pt will initiate tasks with min prompting in a 60 min session   Skilled Therapeutic Interventions/Progress Updates:    Pt resting in bed upon arrival.  Pt declined bathing but requested use of toilet and to change her house coat.  Pt amb with RW to drawers and selected her new house coat before amb with RW to bathroom.  Pt was able to perform toilet hygiene but was not aware that she had not released her grasp with her R hand on the toilet paper.  Pt was able to unzip and zip her housecoat.  Pt stood at sink to was her hands prior to transferring to w/c.  Pt was able to don her sock on her right foot but not her left.  Pt continues to exhibit decreased awareness and coordination with her right hand.  Pt transitioned to table top activities with focus on attention to her right hand and increased [facility] and gross motor tasks. Focus on activity tolerance, functional amb with RW, increased RUE coordination/use, attention to her right, and safety awareness to increase independence with BADLs.  Therapy Documentation Precautions:  Precautions Precautions: Fall Precaution Comments: fall, expressive aphasia, right hemiparesis Restrictions Weight Bearing Restrictions: No Pain:  Pt denied pain ADL: ADL ADL Comments: see functional  navigator  See Function Navigator for Current Functional Status.   Therapy/Group: Individual Therapy  Leroy Libman 03/03/2016, 12:21 PM

## 2016-03-03 NOTE — Patient Care Conference (Signed)
Inpatient RehabilitationTeam Conference and Plan of Care Update Date: 03/04/2016   Time: 2:35 PM    Patient Name: Carolyn [name]      Medical Record Number: [id number]  Date of Birth: 09/29/1933 Sex: Female         Room/Bed: 4W10C/4W10C-01 Payor Info: Payor: [employer] / Plan: [employer] Must / Product Type: *No Product type* /    Admitting Diagnosis: debility  Admit Date/Time:  02/26/2016  3:42 PM Admission Comments: No comment available   Primary Diagnosis:  Traumatic subdural hematoma (HCC) Principal Problem: Traumatic subdural hematoma [facility])  Patient Active Problem List   Diagnosis Date Noted  . Late effect of head trauma, cognitive deficits   . Dysphagia   . Seizures ([place])   . Acute lower UTI   . Altered mental status   . Atelectasis   . Spastic hemiplegia affecting right side 02/19/2016  . S/P craniotomy 02/17/2016  . Traumatic subdural hematoma ([place]) 02/12/2016  . Headache as late effect of brain injury ([place])   . Seizure ([place])   . Difficulty controlling behavior as late effect of traumatic brain injury ([place])   . 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 Memory dysfunction as late effect of traumatic brain injury ([place])   . Benign essential HTN   . DM type 2 with diabetic peripheral neuropathy ([place])   . Thyroid activity decreased   . TBI (traumatic brain injury) ([place])   . Cognitive deficit as late effect of traumatic brain injury ([place])   . Falls frequently   . Controlled type 2 diabetes mellitus with complication, without long-term current use of insulin ([place])   . Tachycardia   . Chronic obstructive pulmonary disease ([place])   . Chronic bronchitis ([place])   . SDH (subdural hematoma) ([place]) 01/20/2016  . Fall 01/20/2016  . Closed fracture of left elbow 01/20/2016  . Diabetes mellitus without complication ([place])   . Hypercholesterolemia   . COPD (chronic obstructive pulmonary disease) ([place])    . Hypothyroid   . Essential hypertension   . Inguinal hernia unilateral, non-recurrent, left 12/02/2011    Expected Discharge Date: Expected Discharge Date: 03/06/16  Team Members Present: Physician leading conference: Dr. Alger Simons Social Worker Present: Lennart Pall, LCSW Nurse Present: Rozetta Nunnery, RN PT Present: Carney Living, PT OT Present: Roanna Epley, Verdie Mosher, OT SLP Present: [place] Anna, SLP PPS Coordinator present : Daiva Nakayama, RN, CRRN     Current Status/Progress Goal Weekly Team Focus  Medical   s/p left SDH with crani redo. aphasic, right hemiparesis, requirng NGT feeds  improve nutrition and engagement of right side  nutrition, seizure control, arousal,    Bowel/Bladder   Continent of B&B  Remain continent of B&B  Monitor bladder and bowel function   Swallow/Nutrition/ Hydration   Dys. 2 textures with thin liquids, full supervision  Min A with least restrictive diet  trials of upgraded textures, utilizaiton of swallowing strategies   ADL's   bathing-min A; dressing-min A; functional transfers-steady A/min A; decreased R attention  overall supervision  activity tolerance, R attention, standing balance, family education   Mobility   supervision, decreased R attention  supervision  functional mobility training, activity tolerance, RUE NMR, R attention, balance, pt/fam education   Communication   Max A  Mod A   verbal expression of wants/needs, speech intelligibility   Safety/Cognition/ Behavioral Observations  Min A  Mod A  problem solving, recall    Pain  No c/o pain  Pain <3  Assess pain and treat pain q shift   Skin   Incision L head dsg in place  No new skin breakdown and infection  Assess skin q shift    Rehab Goals Patient on target to meet rehab goals: Yes *See Care Plan and progress notes for long and short-term goals.  Barriers to Discharge: profound right sided deficits,aphasia    Possible Resolutions to Barriers:  ongoing  NMR, cognitive/linguistic rx, family ed/training    Discharge Planning/Teaching Needs:  Plan to d/c home with daughter and spouse.  Daughter to provide 24/7 supervision.  Teaching is ongoing.   Team Discussion:  Returning pt now with further communication deficits.  Language is improving but very dysarthric.  Upgrading diet and hope to be at D3 tomorrow.  Plan to decrease tube feedings to improve appetite.  Still with some decreased coordination of UE.  Reaching supervision goals.  Revisions to Treatment Plan:  None   Continued Need for Acute Rehabilitation Level of Care: The patient requires daily medical management by a physician with specialized training in physical medicine and rehabilitation for the following conditions: Daily direction of a multidisciplinary physical rehabilitation program to ensure safe treatment while eliciting the highest outcome that is of practical value to the patient.: Yes Daily medical management of patient stability for increased activity during participation in an intensive rehabilitation regime.: Yes Daily analysis of laboratory values and/or radiology reports with any subsequent need for medication adjustment of medical intervention for : Neurological problems;Post surgical problems;Nutritional problems  Elease Hashimoto 03/04/2016, 8:42 AM

## 2016-03-03 NOTE — Progress Notes (Signed)
Social Work Patient ID: Carolyn [name], female   DOB: 1933-08-07, 82 y.o.   MRN: [id number]  Have reviewed team conference with pt and daughter. Both aware and agreeable with targeted d/c date of 4/21 with supervision goals.  Daughter a little surprised that date set for this week.  Asking if Dr. Cyndy Freeze or Naaman Plummer is "in charge" of date and I have explained that Dr. Naaman Plummer and tx team have set this target.  She appears agreeable though and then discussed [place] follow up - this has been arranged with [facility].  Becky Dupree, LCSW to follow case in my absence the remainder of this week.  Dorri Ozturk, LCSW

## 2016-03-03 NOTE — Progress Notes (Signed)
Physical Therapy Session Note  Patient Details  Name: Carolyn [name] MRN: [id number] Date of Birth: 04-Feb-1933  Today's Date: 03/03/2016 PT Individual Time: 1400-1425 and 1500-1600 PT Individual Time Calculation (min): 25 min and 60 min  Short Term Goals: Week 1:  PT Short Term Goal 1 (Week 1): Patient will ambulate 50 ft using RW with supervision. PT Short Term Goal 2 (Week 1): Patient will negotiate 2 (6") stairs using 2 rails with min A.  PT Short Term Goal 3 (Week 1): Patient will maintain standing balance x 3 min with supervision.  PT Short Term Goal 4 (Week 1): Patient will perform bed mobility with consistent supervision.   Skilled Therapeutic Interventions/Progress Updates:   Treatment 1: Patient long sitting in bed with 4 rails, reporting need for bathroom with questioning. Patient ambulated to bathroom using RW and performed toileting tasks with supervision. Patient noted her underwear were wet, ambulated to dresser to retrieve new pair using RW and sat edge of bed to doff/don underwear with supervision. Instructed in BLE therex: marching using RW x 20, heel raises using RW x 20, seated LAQ x 20 each LE. Patient agreeable to staying up, transferred to recliner using RW with supervision and left with all needs within reach and BLE elevated.   Treatment 2: Patient in recliner upon arrival, performed ambulatory transfer across room to wheelchair using RW with supervision. Patient educated on DC plan and became tearful. Patient required increased time and encouragement to participate, stating, "I'm scared of falling," educated on progress to date and recommendation for supervision at DC, patient nodded understanding. In gym, instructed in negotiating up/down curb step using RW x 1 with min A and x 1 with close supervision, verbal cues for RW management and sequencing. Stair training up/down 12 stairs (4 6", 8 3") using 2 rails with self-elected step-to pattern and increased time, supervision.  Patient completed clothespin task placing on vertical rod/furthest horizontal rod using RUE only in seated position due to patient fatigue with focus on attention to RUE and FMC/coordination. Gait training using RW x 50 ft + 160 ft with supervision back to room with increased time. Patient left sitting on toilet, daughter in room.    Therapy Documentation Precautions:  Precautions Precautions: Fall Precaution Comments: fall, expressive aphasia, right hemiparesis Restrictions Weight Bearing Restrictions: No Pain: Pain Assessment Pain Assessment: No/denies pain  See Function Navigator for Current Functional Status.   Therapy/Group: Individual Therapy  Laretta Alstrom 03/03/2016, 2:36 PM

## 2016-03-04 ENCOUNTER — Inpatient Hospital Stay (HOSPITAL_COMMUNITY): Payer: PPO

## 2016-03-04 ENCOUNTER — Inpatient Hospital Stay (HOSPITAL_COMMUNITY): Payer: PPO | Admitting: Speech Pathology

## 2016-03-04 ENCOUNTER — Inpatient Hospital Stay (HOSPITAL_COMMUNITY): Payer: PPO | Admitting: Physical Therapy

## 2016-03-04 DIAGNOSIS — F09 Unspecified mental disorder due to known physiological condition: Secondary | ICD-10-CM | POA: Diagnosis not present

## 2016-03-04 DIAGNOSIS — N390 Urinary tract infection, site not specified: Secondary | ICD-10-CM | POA: Diagnosis not present

## 2016-03-04 DIAGNOSIS — S065X2S Traumatic subdural hemorrhage with loss of consciousness of 31 minutes to 59 minutes, sequela: Secondary | ICD-10-CM | POA: Diagnosis not present

## 2016-03-04 DIAGNOSIS — R1310 Dysphagia, unspecified: Secondary | ICD-10-CM | POA: Diagnosis not present

## 2016-03-04 LAB — GLUCOSE, CAPILLARY
Glucose-Capillary: 137 mg/dL — ABNORMAL HIGH (ref 65–99)
Glucose-Capillary: 124 mg/dL — ABNORMAL HIGH (ref 65–99)
Glucose-Capillary: 119 mg/dL — ABNORMAL HIGH (ref 65–99)
Glucose-Capillary: 105 mg/dL — ABNORMAL HIGH (ref 65–99)

## 2016-03-04 NOTE — Progress Notes (Signed)
Occupational Therapy Session Note  Patient Details  Name: Carolyn [name] MRN: [id number] Date of Birth: 11-15-1933  Today's Date: 03/04/2016 OT Individual Time: 0900-1000 OT Individual Time Calculation (min): 60 min    Short Term Goals: Week 1:  OT Short Term Goal 1 (Week 1): Pt will transfer to toilet with consistant supervision OT Short Term Goal 2 (Week 1): Pt will perform 3/3 toileting steps with mod A  OT Short Term Goal 3 (Week 1): Pt will perform LB dressing (underwear, pants, socks and shoes ) with mod A OT Short Term Goal 4 (Week 1): Pt will stand to complete 2 grooming tasks with min A with one UE support before needing to sit OT Short Term Goal 5 (Week 1): Pt will initiate tasks with min prompting in a 60 min session   Skilled Therapeutic Interventions/Progress Updates:    Pt resting in bed upon arrival with daughter present.  Pt engaged in BADL retraining including toilet transfers and toileting, shower transfers and bathing at shower level, and dressing with sit<>stand from seat.  Pt's daughter assisted patient in all activities and functional amb with RW.  Pt's daughter educated on importance of allowing and encouraging patient to perform self care tasks independently.  Daughter verbalized understanding.  Focus on family education, functional amb with RW, standing balance, task initiation, functional transfers, safety awareness and activity tolerance to increase independence with BADLs and decrease burden of care. Pt remained in w/c with daughter present.   Therapy Documentation Precautions:  Precautions Precautions: Fall Precaution Comments: fall, expressive aphasia, right hemiparesis Restrictions Weight Bearing Restrictions: No   ADL: ADL ADL Comments: see functional navigator  See Function Navigator for Current Functional Status.   Therapy/Group: Individual Therapy  Leroy Libman 03/04/2016, 10:08 AM

## 2016-03-04 NOTE — Progress Notes (Signed)
[place] PHYSICAL MEDICINE & REHABILITATION     PROGRESS NOTE    Subjective/Complaints: Awake in bed. Denies pain. Anxious to get home.  ROS limited by aphasia and cognitive deficits.   Objective: Vital Signs: Blood pressure 158/76, pulse 63, temperature 97.8 F (36.6 C), temperature source Oral, resp. rate 18, height 5\' 6"  (1.676 m), weight 88.27 kg (194 lb 9.6 oz), last menstrual period 03/24/2010, SpO2 97 %. No results found. No results for input(s): WBC, HGB, HCT, PLT in the last 72 hours.  Recent Labs  03/02/16 0431  NA 139  K 4.2  CL 103  GLUCOSE 158*  BUN 11  CREATININE 0.54  CALCIUM 9.5   CBG (last 3)   Recent Labs  03/03/16 1628 03/03/16 2203 03/04/16 0625  GLUCAP 148* 111* 137*    Wt Readings from Last 3 Encounters:  03/04/16 88.27 kg (194 lb 9.6 oz)  02/26/16 90.9 kg (200 lb 6.4 oz)  02/05/16 90.3 kg (199 lb 1.2 oz)    Physical Exam:  Constitutional: She appears well-developed and well-nourished.  HENT:  Craniotomy site clean and dry. Nasogastric tube in place  Eyes: Conjunctivae are normal.  Pupils reactive to light  Neck: Normal range of motion. Neck supple. No thyromegaly present.  Cardiovascular: Normal rate and regular rhythm.  Respiratory: Effort normal and breath sounds normal. No respiratory distress.  GI: Soft. Bowel sounds are normal. She exhibits no distension.  Musculoskeletal: She exhibits no edema or tenderness.  Neurological: She is alert. Aphasic/dysarthric   DTRs symmetric Spontaneously moves right arm and leg. Is perseverative at times. Fairly alert.  Skin: Skin is warm and dry.  Psychiatric:  flat  Assessment/Plan: 1. 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 Physiatrist is providing close team supervision and 24 hour management of active medical problems listed below. Physiatrist and rehab team  continue to assess barriers to discharge/monitor patient progress toward functional and medical goals.  Function:  Bathing Bathing position   Position: Shower  Bathing parts Body parts bathed by patient: Right arm, Left arm, Chest, Abdomen, Right upper leg, Left upper leg, Buttocks, Front perineal area Body parts bathed by helper: Right lower leg, Left lower leg  Bathing assist Assist Level: Touching or steadying assistance(Pt > 75%)      Upper Body Dressing/Undressing Upper body dressing   What is the patient wearing?: Button up shirt     Pull over shirt/dress - Perfomed by patient: Thread/unthread right sleeve, Thread/unthread left sleeve, Pull shirt over trunk Pull over shirt/dress - Perfomed by helper: Put head through opening Button up shirt - Perfomed by patient: Thread/unthread left sleeve      Upper body assist Assist Level: Touching or steadying assistance(Pt > 75%)   Set up : To obtain clothing/put away  Lower Body Dressing/Undressing Lower body dressing   What is the patient wearing?: Underwear, Non-skid slipper socks Underwear - Performed by patient: Thread/unthread right underwear leg, Thread/unthread left underwear leg, Pull underwear up/down Underwear - Performed by helper: Thread/unthread right underwear leg, Thread/unthread left underwear leg       Non-skid slipper socks- Performed by helper: Don/doff right sock, Don/doff left sock                  Lower body assist Assist for lower body dressing: Touching or steadying assistance (Pt > 75%)      Toileting Toileting   Toileting steps completed by patient: Adjust clothing  prior to toileting, Performs perineal hygiene, Adjust clothing after toileting Toileting steps completed by helper: Adjust clothing prior to toileting Toileting Assistive Devices: Grab bar or rail  Toileting assist Assist level: Supervision or verbal cues   Transfers Chair/bed transfer   Chair/bed transfer method:  Ambulatory Chair/bed transfer assist level: Supervision or verbal cues Chair/bed transfer assistive device: Walker, Air cabin crew     Max distance: 160 ft Assist level: Supervision or verbal cues   Wheelchair   Type: Manual   Assist Level: Dependent (Pt equals 0%)  Cognition Comprehension Comprehension assist level: Understands basic 90% of the time/cues < 10% of the time  Expression Expression assist level: Expresses basis less than 25% of the time/requires cueing >75% of the time.  Social Interaction Social Interaction assist level: Interacts appropriately 75 - 89% of the time - Needs redirection for appropriate language or to initiate interaction.  Problem Solving Problem solving assist level: Solves basic 75 - 89% of the time/requires cueing 10 - 24% of the time  Memory Memory assist level: Recognizes or recalls 75 - 89% of the time/requires cueing 10 - 24% of the time     Medical Problem List and Plan: 1. Right hemiparesthesias/aphasia/dysphasia secondary to traumatic subdural hematoma status post left craniotomy evacuated subdural hematoma with reaccumulation of fluid craniotomy 2  -continue CIR therapies  -moving well with PT/OT   2. DVT Prophylaxis/Anticoagulation: SCDs. Monitor for any signs of DVT 3. Pain Management: Tylenol as needed 4. Dysphagia. Now on Dysphagia #2 thin liquids.     -slightly better po intake but complains of fullness with NGT/TF  -will dc TF and HS IVF---observe intake---tried to discuss with patient today also 5. Neuropsych: This patient is not capable of making decisions on her own behalf. 6. Skin/Wound Care: remove staples 7. Fluids/Electrolytes/Nutrition: Routine I&O with follow-up chemistries 8. Seizure disorder. Keppra 1250 mg every 12 hours.   -continue at current dose with sedation after dosing however.  9. Hypertension. Lisinopril 20 mg daily at bedtime, verapamil 40 mg 3 times a day,Lopressor 25 mg  BID.  -controlled at present 10. Diabetes mellitus. Hemoglobin A1c 6.7. Amaryl 4 mg daily at bedtime. Check blood sugars before meals and at bedtime  -continued tight control 11. COPD. Continue Dulera and nebulizers as directed 12. UTI. 100k yeast---changed to diflucan---7 day course 13. Hypothyroidism. Thyroid 60 mg daily at bedtime. Latest TSH 0.285  LOS (Days) 7 A FACE TO FACE EVALUATION WAS PERFORMED  SWARTZ,ZACHARY T 03/04/2016 9:17 AM

## 2016-03-04 NOTE — Progress Notes (Signed)
No acute events Making progress Doing well

## 2016-03-04 NOTE — Progress Notes (Signed)
Physical Therapy Session Note  Patient Details  Name: Carolyn [name] MRN: [id number] Date of Birth: 1933-08-19  Today's Date: 03/04/2016 PT Individual Time: 1100-1200 PT Individual Time Calculation (min): 60 min   Short Term Goals: Week 1:  PT Short Term Goal 1 (Week 1): Patient will ambulate 50 ft using RW with supervision. PT Short Term Goal 2 (Week 1): Patient will negotiate 2 (6") stairs using 2 rails with min A.  PT Short Term Goal 3 (Week 1): Patient will maintain standing balance x 3 min with supervision.  PT Short Term Goal 4 (Week 1): Patient will perform bed mobility with consistent supervision.  Skilled Therapeutic Interventions/Progress Updates:   Patient in bed upon arrival, stating, "bathroom." Patient sat edge of bed and ambulated to toilet using RW, performed toileting tasks with supervision and verbal cues to attend to [name]. Patient stood approx 5 min using RW while ordering lunch meal. Performed hand hygiene in standing with supervision. In ADL apartment, patient ambulated on carpet using RW and performed bed mobility on regular bed with mod I. Patient performed multiple sit <> stand transfers using RW to bed, wheelchair, mat table, and low compliant couch surface with supervision and max verbal cues for safe hand placement and safe RW management. From seated position, patient doffed soiled/donned clean pillow cases using BUE x 4 and folded 5 washcloths on tray table using RUE only (holding object in LUE to prevent use) with increased time and max verbal cues to attend to RUE. Gait training using RW x 75 ft + 180 ft with supervision and increased time. Patient left sitting in recliner with BLE elevated and all needs in reach, NT present.   Therapy Documentation Precautions:  Precautions Precautions: Fall Precaution Comments: fall, expressive aphasia, right hemiparesis Restrictions Weight Bearing Restrictions: No Pain: Pain Assessment Pain Assessment: No/denies pain   See  Function Navigator for Current Functional Status.   Therapy/Group: Individual Therapy  Laretta Alstrom 03/04/2016, 12:41 PM

## 2016-03-04 NOTE — Progress Notes (Signed)
Speech Language Pathology Daily Session Notes  Patient Details  Name: Carolyn [name] MRN: [id number] Date of Birth: 01-Oct-1933  Today's Date: 03/04/2016  Session 1: SLP Individual Time: 0800-0900 SLP Individual Time Calculation (min): 60 min   Session 2: SLP Individual Time: [id number] SLP Individual Time Calculation (min): 35 min  Short Term Goals: Week 1: SLP Short Term Goal 1 (Week 1): Pt will name familiar objects with Min A verbal cues with 90%.  SLP Short Term Goal 2 (Week 1): Pt will increase bolus management of dysphagia 2 trials as evidenced by timely oral prep and complete oral clearing with Mod A verbal cues.  SLP Short Term Goal 3 (Week 1): Pt will demonstrate sustained attention for 5 minutes with Mod I.  SLP Short Term Goal 4 (Week 1): Pt will imitate simple automatic phrases with 90% and Mod A verbal cues.  SLP Short Term Goal 5 (Week 1): Pt will demonstrate basic problem solving for familiar tasks with Min A verbal cues.  SLP Short Term Goal 6 (Week 1): Patient will utilize an increased vocal intensity to maximize intelligigibility to ~90% at the word level with Min A verbal cues.   Skilled Therapeutic Interventions:  Session 1: Skilled treatment session focused on dysphagia and functional communication goals. SLP facilitated session by providing skilled observation with breakfast meal of Dys. 3 textures with thin liquids. Patient consumed meal without overt s/s of aspiration and demonstrated efficient mastication and required supervision verbal cues to clear her oral cavity and self-monitor and correct right buccal pocketing. Recommend patient upgrade to Dys. 3 textures.SLP also facilitated session by providing extra time and Mod A multimodal cues for use of speech intelligibility strategies to maximize her intelligibility to ~50-75% at the phrase level during a functional conversation. Patient demonstrated sustained attention to tasks for 30 minutes with supervision verbal cues  for redirection. Patient left upright in bed with all needs within reach. Continue with current plan of care.   Session 2: Skilled treatment session focused on cognitive-linguistic goals. SLP facilitated session by providing supervision verbal cues for problem solving during a 4 step picture sequencing task and Mod A question and verbal cues for word-finding and use of speech intelligibility strategies at the phrase level during a structured verbal description task. Patient requested to use the bathroom and completed all tasks with supervision use of the RW. Patient transferred to bed at end of session and awaiting IV team. Patient left supine in bed with all needs within reach and bed alarm on. Continue with current plan of care.   Function:  Eating Eating   Modified Consistency Diet: Yes Eating Assist Level: Set up assist for;Supervision or verbal cues;Helper checks for pocketed food   Eating Set Up Assist For: Opening containers       Cognition Comprehension Comprehension assist level: Understands basic 90% of the time/cues < 10% of the time  Expression   Expression assist level: Expresses basic 50 - 74% of the time/requires cueing 25 - 49% of the time. Needs to repeat parts of sentences.  Social Interaction Social Interaction assist level: Interacts appropriately 90% of the time - Needs monitoring or encouragement for participation or interaction.  Problem Solving Problem solving assist level: Solves basic 90% of the time/requires cueing < 10% of the time  Memory Memory assist level: Recognizes or recalls 90% of the time/requires cueing < 10% of the time    Pain Pain Assessment Pain Assessment: No/denies pain  Therapy/Group: Individual Therapy  Lakeshia Dohner 03/04/2016,  4:34 PM

## 2016-03-05 ENCOUNTER — Inpatient Hospital Stay (HOSPITAL_COMMUNITY): Payer: PPO | Admitting: Physical Therapy

## 2016-03-05 ENCOUNTER — Inpatient Hospital Stay (HOSPITAL_COMMUNITY): Payer: PPO | Admitting: Speech Pathology

## 2016-03-05 ENCOUNTER — Inpatient Hospital Stay (HOSPITAL_COMMUNITY): Payer: PPO | Admitting: Occupational Therapy

## 2016-03-05 DIAGNOSIS — R569 Unspecified convulsions: Secondary | ICD-10-CM | POA: Diagnosis not present

## 2016-03-05 DIAGNOSIS — N390 Urinary tract infection, site not specified: Secondary | ICD-10-CM | POA: Diagnosis not present

## 2016-03-05 DIAGNOSIS — S065X2S Traumatic subdural hemorrhage with loss of consciousness of 31 minutes to 59 minutes, sequela: Secondary | ICD-10-CM | POA: Diagnosis not present

## 2016-03-05 DIAGNOSIS — R1310 Dysphagia, unspecified: Secondary | ICD-10-CM | POA: Diagnosis not present

## 2016-03-05 LAB — GLUCOSE, CAPILLARY
Glucose-Capillary: 135 mg/dL — ABNORMAL HIGH (ref 65–99)
Glucose-Capillary: 110 mg/dL — ABNORMAL HIGH (ref 65–99)
Glucose-Capillary: 115 mg/dL — ABNORMAL HIGH (ref 65–99)
Glucose-Capillary: 102 mg/dL — ABNORMAL HIGH (ref 65–99)

## 2016-03-05 NOTE — Progress Notes (Signed)
Occupational Therapy Discharge Summary  Patient Details  Name: Carolyn [name] MRN: [id number] Date of Birth: 04-11-1933    Patient has met 77 of 46 long term goals due to improved activity tolerance, improved balance, postural control, functional use of  RIGHT upper and RIGHT lower extremity, improved attention, improved awareness and improved coordination.  Patient to discharge at overall Supervision level.  Patient's care partner is independent to provide the necessary physical and cognitive assistance at discharge.    Reasons goals not met: n/a  Recommendation:  Patient will benefit from ongoing skilled OT services in home health setting to continue to advance functional skills in the area of BADL and Reduce care partner burden.  Equipment: No equipment provided  Reasons for discharge: treatment goals met and discharge from hospital  Patient/family agrees with progress made and goals achieved: Yes  OT Discharge Precautions/Restrictions  Fall risk, inattention to body and environmental  Vital Signs Therapy Vitals BP: (!) 148/106 mmHg Pain  off and on HA throughout her days ADL ADL ADL Comments: see functional navigator Vision/Perception    body and environmental inattention to the right.  Cognition  [place] Level VII; still requiring supervision for safety with functional mobility and tasks, attention is at alternating with a quiet environmental (progressing to this level lof attention with min distracting)   Sensation  decr sensation feedback in right hand (can drop items with decr attention to hand) Motor   improved postural control and hemiplegia  Mobility    bed mobility: supervision Sit to stands: supervision Stand pivot transfers with RW: supervision,  Functional ambulation with supervision with RW with VC for safety Trunk/Postural Assessment  Cervical Assessment Cervical Assessment: Within Functional Limits Thoracic Assessment Thoracic Assessment: Exceptions  to [facility] (forward flexed) Lumbar Assessment Lumbar Assessment: Exceptions to [facility] (posterior pelvic tilt) Postural Control Postural Control: Within Functional Limits  Balance Dynamic Standing Balance Dynamic Standing - Balance Support: During functional activity;Left upper extremity supported Dynamic Standing - Level of Assistance: 5: Stand by assistance Extremity/Trunk Assessment  right UE- WFL for ROM and strength 4/5 (continues to demonstrate decr attention to body and environment with functional tasks. WFL dexterity.  Left UE- [facility]     See Function Navigator for Current Functional Status.  Willeen Cass [facility] 03/05/2016, 3:28 PM

## 2016-03-05 NOTE — Progress Notes (Signed)
Nutrition Follow-up  DOCUMENTATION CODES:   Obesity unspecified  INTERVENTION:  Continue Ensure Enlive po BID, each supplement provides 350 kcal and 20 grams of protein.  Encourage adequate PO intake.   NUTRITION DIAGNOSIS:   Inadequate oral intake related to poor appetite as evidenced by meal completion < 50%; improved  GOAL:   Patient will meet greater than or equal to 90% of their needs; met  MONITOR:   PO intake, Supplement acceptance, Weight trends, Labs, I & O's  REASON FOR ASSESSMENT:   Consult Enteral/tube feeding initiation and management  ASSESSMENT:   Carolyn [name] is a 82 y.o. right handed female with history of hypertension, diabetes mellitus, COPD. Patient with with history of subdural hematoma after multiple falls beginning in March 2017 as well as left radial head fracture. Presents with Traumatic Subdural Hematoma  Diet has been advanced to a dysphagia 3 diet. Appetite improved. NGT out. Tube feeding has been discontinued. Meal completion recently has been 80-100%. Pt has Ensure ordered and has been consuming hem. RD to continue with current orders. Plans for discharge tomorrow.   Diet Order:  DIET DYS 3 Room service appropriate?: Yes; Fluid consistency:: Thin  Skin:   (Incision on L head)  Last BM:  4/19  Height:   Ht Readings from Last 1 Encounters:  02/26/16 '5\' 6"'$  (1.676 m)    Weight:   Wt Readings from Last 1 Encounters:  03/05/16 182 lb 6.4 oz (82.736 kg)    Ideal Body Weight:  59 kg  BMI:  Body mass index is 29.45 kg/(m^2).  Estimated Nutritional Needs:   Kcal:  1600-1800  Protein:  80-90 grams  Fluid:  1.6 - 1.8 L/day  EDUCATION NEEDS:   No education needs identified at this time  Corrin Parker, MS, RD, LDN Pager # [phone] After hours/ weekend pager # [phone]

## 2016-03-05 NOTE — Progress Notes (Signed)
Social Work Patient ID: Carolyn [name], female   DOB: 1933/07/01, 82 y.o.   MRN: [id number] Spoke with team who reports pt has all needed equipment and follow up therapies arranged via [place]. Pt ready to go home tomorrow.

## 2016-03-05 NOTE — Progress Notes (Signed)
Doing well Speech improving Motor stable Wound looks good Expect continued progress Agree with planned d/c tomorrow

## 2016-03-05 NOTE — Discharge Summary (Signed)
Discharge summary job [phone]

## 2016-03-05 NOTE — Progress Notes (Signed)
Speech Language Pathology Daily Session Note  Patient Details  Name: Carolyn [name] MRN: [id number] Date of Birth: [birth year]  Today's Date: 03/05/2016  Session 1: SLP Individual Time: 0800-0900 SLP Individual Time Calculation (min): 60 min   Session 2: SLP Individual Time: [id number] SLP Individual Time Calculation (min): 30 min  Short Term Goals: Week 1: SLP Short Term Goal 1 (Week 1): Pt will name familiar objects with Min A verbal cues with 90%.  SLP Short Term Goal 2 (Week 1): Pt will increase bolus management of dysphagia 2 trials as evidenced by timely oral prep and complete oral clearing with Mod A verbal cues.  SLP Short Term Goal 3 (Week 1): Pt will demonstrate sustained attention for 5 minutes with Mod I.  SLP Short Term Goal 4 (Week 1): Pt will imitate simple automatic phrases with 90% and Mod A verbal cues.  SLP Short Term Goal 5 (Week 1): Pt will demonstrate basic problem solving for familiar tasks with Min A verbal cues.  SLP Short Term Goal 6 (Week 1): Patient will utilize an increased vocal intensity to maximize intelligigibility to ~90% at the word level with Min A verbal cues.   Skilled Therapeutic Interventions:  Session 1: Skilled treatment session focused on cognitive-linguistic goals. Patient was administered subtest's of the Western Aphasia Battery[place]. Patient completed content subtest with 80% accuracy, auditory comprehension subtest with 90% accuracy, followed sequential commands with 100% accuracy, performed repetition tasks with 90% accuracy, reading comprehension tasks with 70% accuracy and object naming with 100% accuracy. Patient had difficulty with writing subtest due to RUE weakness but was able to write her name accurately. Patient left sitting EOB with RN present. Continue with current plan of care.   Session 2: Skilled treatment session focused on functional communication. Patient participated in a functional conversation with focus on anticipatory  awareness in regards to d/c planning. Patient was ~75% intelligible at the phrase and sentence level with Mod A verbal and question cues needed for use of speech intelligibility strategies. Handouts were also provided to the patient and her daughter who was not present during the session to reinforce all education that has been provided in regards to the patient's swallowing and cognitive-linguistic function. Patient left supine in bed with alarm on and all needs within reach. Continue with current plan of care.   Function:  Cognition Comprehension Comprehension assist level: Understands basic 90% of the time/cues < 10% of the time  Expression   Expression assist level: Expresses basic 50 - 74% of the time/requires cueing 25 - 49% of the time. Needs to repeat parts of sentences.  Social Interaction Social Interaction assist level: Interacts appropriately 90% of the time - Needs monitoring or encouragement for participation or interaction.  Problem Solving Problem solving assist level: Solves basic 90% of the time/requires cueing < 10% of the time  Memory Memory assist level: Recognizes or recalls 90% of the time/requires cueing < 10% of the time    Pain No/Denies Pain   Therapy/Group: Individual Therapy  Emmalee Solivan 03/05/2016, 4:13 PM

## 2016-03-05 NOTE — Progress Notes (Signed)
Physical Therapy Discharge Summary  Patient Details  Name: Carolyn [name] MRN: [id number] Date of Birth: 02-Nov-1933  Today's Date: 03/05/2016 PT Individual Time: 1100-1200 PT Individual Time Calculation (min): 60 min    Patient has met 9 of 9 long term goals due to improved activity tolerance, improved balance, improved postural control, increased strength, decreased pain, ability to compensate for deficits, functional use of  right upper extremity, improved attention, improved awareness and improved coordination.  Patient to discharge at an ambulatory level Supervision.   Patient's care partner is independent to provide the necessary physical and cognitive assistance at discharge.  Reasons goals not met: NA  Recommendation:  Patient will benefit from ongoing skilled PT services in home health setting to continue to advance safe functional mobility, address ongoing impairments in balance, strength, RUE hemiparesis, coordination, R attention, and minimize fall risk.  Equipment: No equipment provided-patient owns RW  Reasons for discharge: treatment goals met and discharge from hospital  Patient/family agrees with progress made and goals achieved: Yes  Skilled Therapeutic Intervention Patient crying upon seeing PT enter room requiring increased time to participate. Patient asking, "Can't we just stay in the room?" Educated patient on goals of session, and patient agreeable with max encouragement. Patient required supervision with use of RW and verbal cues for attending to R side to avoid obstacles for gait in controlled and home environment, transfers with verbal cues for safe hand placement, simulated car transfer to sedan height, negotiating up/down ramp, ambulation on uneven woodchip surface, toileting in room, and stair training with 2 rails. Patient required frequent rest breaks due to fatigue. Patient returned to room and left sitting edge of bed with daughter present.   PT  Discharge Precautions/Restrictions Precautions Precautions: Fall Precaution Comments: expressive aphasia Restrictions Weight Bearing Restrictions: No Pain Pain Assessment Pain Assessment: No/denies pain Vision/Perception  Vision - Assessment Eye Alignment: Within Functional Limits  See OT discharge summary  Cognition Overall Cognitive Status: Impaired/Different from baseline Arousal/Alertness: Awake/alert Orientation Level: Oriented X4 Focused Attention: Appears intact Sustained Attention: Appears intact Selective Attention: Appears intact Memory: Appears intact Awareness: Appears intact Problem Solving: Impaired Problem Solving Impairment: Functional basic Safety/Judgment: Impaired [employer] Scales of Cognitive Functioning: Purposeful/appropriate Sensation Sensation Light Touch: Impaired Detail Light Touch Impaired Details: Impaired RUE Hot/Cold: Appears Intact Proprioception: Impaired Detail Proprioception Impaired Details: Impaired RUE Coordination Gross Motor Movements are Fluid and Coordinated: No Fine Motor Movements are Fluid and Coordinated: No Motor  Motor Motor: Hemiplegia Motor - Discharge Observations: RUE weakness/incoordination, R inattention  Mobility Bed Mobility Bed Mobility: Sit to Supine;Rolling Right;Rolling Left;Supine to Sit Rolling Right: 6: Modified independent (Device/Increase time) Rolling Left: 6: Modified independent (Device/Increase time) Supine to Sit: 6: Modified independent (Device/Increase time) Sit to Supine: 6: Modified independent (Device/Increase time) Transfers Transfers: Yes Sit to Stand: 5: Supervision;With upper extremity assist Stand to Sit: 5: Supervision Stand Pivot Transfers: 5: Supervision Locomotion  Ambulation Ambulation: Yes Ambulation/Gait Assistance: 5: Supervision Ambulation Distance (Feet): 150 Feet Assistive device: Rolling walker Gait Gait: Yes Gait Pattern: Impaired Gait Pattern: Decreased  stride length;Trunk flexed;Lateral trunk lean to right;Lateral trunk lean to left;Poor foot clearance - right;Poor foot clearance - left;Step-through pattern Gait velocity: decreased Stairs / Additional Locomotion Stairs: Yes Stairs Assistance: 5: Supervision Stair Management Technique: Two rails;Step to pattern;Forwards Number of Stairs: 12 Height of Stairs: 3 (8 3", 4 6") Ramp: 5: Supervision Curb: 5: Supervision Wheelchair Mobility Wheelchair Mobility: No  Trunk/Postural Assessment  Cervical Assessment Cervical Assessment: Within Functional Limits Thoracic Assessment  Thoracic Assessment: Exceptions to [facility] (forward flexed) Lumbar Assessment Lumbar Assessment: Exceptions to [facility] (posterior pelvic tilt) Postural Control Postural Control: Within Functional Limits  Balance Dynamic Standing Balance Dynamic Standing - Balance Support: During functional activity;Left upper extremity supported Dynamic Standing - Level of Assistance: 5: Stand by assistance Extremity Assessment  RUE Assessment RUE Assessment: Exceptions to [facility] RUE AROM (degrees) RUE Overall AROM Comments: 0-90 degrees shoulder flexion; distally appears WFL RUE Strength RUE Overall Strength Comments: overall 3/5 RUE Tone RUE Tone: Within Functional Limits LUE Assessment LUE Assessment: Exceptions to [facility] LUE Strength LUE Overall Strength Comments: has had a previous fx to left radial head, overall strength 3/5 LUE Tone LUE Tone: Within Functional Limits RLE Assessment RLE Assessment: Exceptions to [facility] RLE Strength RLE Overall Strength: Deficits RLE Overall Strength Comments: hip flexion 3+/5, knee extension 3+/5 and painful, knee flexion and ankle DF/PF 4/5 LLE Assessment LLE Assessment: Exceptions to [facility] LLE Strength LLE Overall Strength: Deficits LLE Overall Strength Comments: hip flexion 3+/5, knee flexion/extension and ankle DF/PF 4/5   See Function Navigator for Current Functional Status.  Carney Living A 03/05/2016, 11:34 AM

## 2016-03-05 NOTE — Progress Notes (Signed)
Occupational Therapy Session Note  Patient Details  Name: DEMONI LAWTON MRN: [id number] Date of Birth: 1933-06-21  Today's Date: 03/05/2016 OT Individual Time: 0100-0155 OT Individual Time Calculation (min): 55 min    Skilled Therapeutic Interventions/Progress Updates:    1:1 Pt reports today is a "rough day" and is fatigued and does not want to participate in grad day activities in prep for d/c tomorrow. Provided verbal education to daughter and patient on home safety recommendations, importance of using RW, establishing a routine at home, etc. Discussed safer option of getting into bathtub at home with tub bench v tub seat however pt declined out of bed activity/ out of room activity.  Agreed to go to the bathroom with RW with supervision and performed toileting and LB bathing and dressing with setup and distant supervision, grooming at sink with supervision. Pt with significant improved ability to express herself with extra time.   Therapy Documentation Precautions:  Precautions Precautions: Fall Precaution Comments: expressive aphasia Restrictions Weight Bearing Restrictions: No General:   Vital Signs: Therapy Vitals BP: (!) 148/106 mmHg Pain: Pain Assessment Pain Assessment: No/denies pain ADL: ADL ADL Comments: see functional navigator Exercises:   Other Treatments:    See Function Navigator for Current Functional Status.   Therapy/Group: Individual Therapy  [facility] 03/05/2016, 3:18 PM

## 2016-03-05 NOTE — Progress Notes (Addendum)
[place] PHYSICAL MEDICINE & REHABILITATION     PROGRESS NOTE    Subjective/Complaints: Sitting in bed. Starting on breakfast. Ate fairly well yesterday. Has more appetite today  ROS limited by aphasia and dysarthria   Objective: Vital Signs: Blood pressure 160/94, pulse 73, temperature 97.9 F (36.6 C), temperature source Oral, resp. rate 16, height 5\' 6"  (1.676 m), weight 82.736 kg (182 lb 6.4 oz), last menstrual period 03/24/2010, SpO2 96 %. No results found. No results for input(s): WBC, HGB, HCT, PLT in the last 72 hours. No results for input(s): NA, K, CL, GLUCOSE, BUN, CREATININE, CALCIUM in the last 72 hours.  Invalid input(s): CO CBG (last 3)   Recent Labs  03/04/16 1700 03/04/16 2118 03/05/16 0705  GLUCAP 119* 105* 135*    Wt Readings from Last 3 Encounters:  03/05/16 82.736 kg (182 lb 6.4 oz)  02/26/16 90.9 kg (200 lb 6.4 oz)  02/05/16 90.3 kg (199 lb 1.2 oz)    Physical Exam:  Constitutional: She appears well-developed and well-nourished.  HENT:  Craniotomy site clean and dry--staples out. Nasogastric tube in place  Eyes: Conjunctivae are normal.  Pupils reactive to light  Neck: Normal range of motion. Neck supple. No thyromegaly present.  Cardiovascular: Normal rate and regular rhythm.  Respiratory: Effort normal and breath sounds normal. No respiratory distress.  GI: Soft. Bowel sounds are normal. She exhibits no distension.  Musculoskeletal: She exhibits no edema or tenderness.  Neurological: She is alert. Aphasic/dysarthric   DTRs symmetric Engaging right side more. Good sitting balance Skin: Skin is warm and dry.  Psychiatric:  flat  Assessment/Plan: 1. 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 Physiatrist is providing close team supervision and 24 hour management of active medical problems listed below. Physiatrist  and rehab team continue to assess barriers to discharge/monitor patient progress toward functional and medical goals.  Function:  Bathing Bathing position   Position: Shower  Bathing parts Body parts bathed by patient: Right arm, Left arm, Chest, Abdomen, Right upper leg, Left upper leg, Buttocks, Front perineal area Body parts bathed by helper: Right lower leg, Left lower leg  Bathing assist Assist Level: Touching or steadying assistance(Pt > 75%)      Upper Body Dressing/Undressing Upper body dressing   What is the patient wearing?: Button up shirt     Pull over shirt/dress - Perfomed by patient: Thread/unthread right sleeve, Thread/unthread left sleeve, Pull shirt over trunk Pull over shirt/dress - Perfomed by helper: Put head through opening Button up shirt - Perfomed by patient: Thread/unthread right sleeve, Thread/unthread left sleeve, Button/unbutton shirt Button up shirt - Perfomed by helper: Pull shirt around back    Upper body assist Assist Level: Touching or steadying assistance(Pt > 75%)   Set up : To obtain clothing/put away  Lower Body Dressing/Undressing Lower body dressing   What is the patient wearing?: Underwear, Non-skid slipper socks Underwear - Performed by patient: Thread/unthread right underwear leg, Thread/unthread left underwear leg, Pull underwear up/down Underwear - Performed by helper: Thread/unthread right underwear leg, Thread/unthread left underwear leg       Non-skid slipper socks- Performed by helper: Don/doff right sock, Don/doff left sock                  Lower body assist Assist for lower body dressing: Touching or steadying assistance (Pt > 75%)      Toileting Toileting   Toileting steps completed  by patient: Adjust clothing prior to toileting, Performs perineal hygiene, Adjust clothing after toileting Toileting steps completed by helper: Adjust clothing prior to toileting Toileting Assistive Devices: Grab bar or rail  Toileting  assist Assist level: Supervision or verbal cues   Transfers Chair/bed transfer   Chair/bed transfer method: Ambulatory Chair/bed transfer assist level: Supervision or verbal cues Chair/bed transfer assistive device: Walker, Air cabin crew     Max distance: 180 ft Assist level: Supervision or verbal cues   Wheelchair   Type: Manual   Assist Level: Dependent (Pt equals 0%)  Cognition Comprehension Comprehension assist level: Understands basic 90% of the time/cues < 10% of the time  Expression Expression assist level: Expresses basic 50 - 74% of the time/requires cueing 25 - 49% of the time. Needs to repeat parts of sentences.  Social Interaction Social Interaction assist level: Interacts appropriately 90% of the time - Needs monitoring or encouragement for participation or interaction.  Problem Solving Problem solving assist level: Solves basic 90% of the time/requires cueing < 10% of the time  Memory Memory assist level: Recognizes or recalls 90% of the time/requires cueing < 10% of the time     Medical Problem List and Plan: 1. Right hemiparesthesias/aphasia/dysphasia secondary to traumatic subdural hematoma status post left craniotomy evacuated subdural hematoma with reaccumulation of fluid craniotomy 2  -continue CIR therapies  -moving well with PT/OT   2. DVT Prophylaxis/Anticoagulation: SCDs. Monitor for any signs of DVT 3. Pain Management: Tylenol as needed 4. Dysphagia. Now on Dysphagia #2 thin liquids.     -off TF. Ate much better yesterday.   -remove NGT/, dc IVF 5. Neuropsych: This patient is not capable of making decisions on her own behalf. 6. Skin/Wound Care: remove staples 7. Fluids/Electrolytes/Nutrition: Routine I&O with follow-up chemistries 8. Seizure disorder. Keppra 1250 mg every 12 hours.   -continue at current dose with sedation after dosing however.  9. Hypertension. Lisinopril 20 mg daily at bedtime, verapamil 40 mg 3 times a  day,Lopressor 25 mg BID.  -controlled at present 10. Diabetes mellitus. Hemoglobin A1c 6.7. Amaryl 4 mg daily at bedtime. Check blood sugars before meals and at bedtime  -continued tight control--may need adjustment depending upon PO intake 11. COPD. Continue Dulera and nebulizers as directed 12. UTI. 100k yeast---changed to diflucan---7 day course through sunday 13. Hypothyroidism. Thyroid 60 mg daily at bedtime. Latest TSH 0.285    LOS (Days) 8 A FACE TO FACE EVALUATION WAS PERFORMED  Pleas Carneal T 03/05/2016 8:33 AM

## 2016-03-05 NOTE — Discharge Summary (Signed)
[name], [name] NO.:  [id number]  MEDICAL RECORD NO.:  [id number]  LOCATION:  [id number]                        FACILITY:  [facility]  PHYSICIAN:  Lauraine Rinne, P.A.  DATE OF BIRTH:  14-May-1933  DATE OF ADMISSION:  02/26/2016 DATE OF DISCHARGE:                              DISCHARGE SUMMARY   DISCHARGE DIAGNOSES: 1. Right-sided weakness, aphasia, dysphagia secondary to traumatic     subdural hematoma, status post craniotomy, evacuation of hematoma     with reaccumulation of fluid, craniotomy x2. 2. SCDs for DVT prophylaxis. 3. Dysphagia. 4. Seizure disorder. 5. Hypertension. 6. Diabetes mellitus. 7. Chronic obstructive pulmonary disease. 8. Urianry tract infection. 9. Hypothyroidism.  HISTORY OF PRESENT ILLNESS:  This is an 82 year old right-handed female, with history of hypertension, diabetes mellitus, COPD.  She lives with her husband and daughter.  The patient with a history of subdural hematoma after multiple falls beginning in March 2017 as well as left radial head fracture, recent simple partial seizure, presented to emergency department on February 06, 2016.  Cranial CT scan revealed enlarging chronic subdural hematoma.  Underwent left craniotomy for evacuation of subdural hematoma on February 06, 2016, maintained on Keppra for suspect seizure.  Tolerating a regular diet.  Repeat CT suggested increased midline shift.  Physical and occupational therapy evaluations completed, ongoing.  She was admitted for comprehensive rehab program and monitoring.  The patient with slow overall progress, persistent headaches, trials of Topamax discontinued due to altered mental status, appeared to be more arousable after medication adjustment.  She remained on Keppra as advised.  Cranial CT scan followup on 03/31 compared to prior tracing showed some increasing right-sided weakness on clinical exam, altered mental status, evolutionary changes of subdural  hematoma, decreasing pneumocephalus, and close monitoring of left to right midline shift as previously identified.  Urine study, no growth.  Cranial CT scan again completed on February 17, 2016 due to waxing and waning of mental status, again unchanged size of residual mixed densities, left subdural hematoma 6 mm, rightward midline shift, again unchanged.  Neurosurgery contacted, felt to be with declined neurologically and left frontal subdural collection still causing mass effect.  She should return back to the OR.  She was discharged to acute care services on February 17, 2016, and underwent reentry left craniotomy for evacuation of subdural hematoma per Dr. Cyndy Freeze.  Postoperatively, continued to showed neurological decline, became less verbal, and increasing right-sided weakness.  Followup CT demonstrated reaccumulation of left convex to the subdural hematoma with significant mass effect.  She again returned to the OR for evacuation of residual subdural hematoma.  Repeat EEG suggested of possible epileptogenic potential in the left frontoparietal region superimposed mild encephalopathy.  Her Keppra was adjusted.  Diet slowly advanced.  Followup urine study positive for nitrite, large leukocyte, and sensitivities pending.  Placed on empiric Rocephin.  The patient was readmitted for comprehensive rehab program.  PAST MEDICAL HISTORY:  See discharge diagnoses.  SOCIAL HISTORY:  Lives with spouse and daughter, noted recent falls. Functional status upon admission to rehab services was moderate assist sit to stand; +2 stand pivot transfers, max assist sit to supine, mod total assist to activities of daily  living.  PHYSICAL EXAMINATION:  VITAL SIGNS:  Blood pressure 173/74, pulse 65, temperature 98, and respirations 23. GENERAL:  This was an alert female, craniotomy site clean and dry.  She did have a nasogastric tube in place for nutritional support. HEENT:  Pupils reactive to light. LUNGS:   Decreased breath sounds.  Clear to auscultation. CARDIAC:  Regular rate and rhythm.  No murmur. ABDOMEN:  Soft and nontender.  Good bowel sounds.  She was nonverbal during initial exam.  REHABILITATION/HOSPITAL COURSE:  The patient was admitted to inpatient rehab services with therapies initiated on a 3-hour daily basis consisting of physical therapy, occupational therapy, speech therapy, and rehabilitation nursing.  The following issues were addressed during the patient's rehabilitation stay.  Pertaining to Ms. Thole's traumatic subdural hematoma, she had undergone craniotomy, evacuation of hematoma as well as reaccumulation of fluid craniotomy x2.  She would follow up with Neurosurgery.  SCDs for DVT prophylaxis.  Her diet was advanced to mechanical soft.  Her nighttime IV fluids were discontinued.  She continued on Keppra for seizure prophylaxis.  Blood pressure is well controlled and monitored.  No orthostatic hypotension on current regimen.  Diabetes mellitus, peripheral neuropathy.  Hemoglobin A1c of 6.7.  She remained on low-dose Amaryl.  She had completed a course of Diflucan for a yeast urinary tract infection.  She would remain on hormone supplement for hypothyroidism.  The patient received weekly collaborative interdisciplinary team conferences to discuss estimated length of stay, family teaching, and any barriers to discharge.  The patient sat at edge of bed.  Ambulated to the toilet, used a rolling walker, performed toileting tasks with supervision and verbal cues.  She could stand for approximately 5 minutes using a rolling walker while ordering a lunch meal.  Performed hygiene in standing position with supervision.  In the ADL apartment, the patient ambulated on carpet using a rolling walker, performed bed mobility, on a regular bed modified independent, activities of daily living and homemaking.  She engaged in ADLs retraining including toilet transfers, toileting,  shower transfers, and bathing at shower level, and dressing with sit to stand from a seated position.  Speech therapy follow up as noted.  Diet slowly advanced.  Sessions focused on language goals, facilitated session by providing skilled observation with breakfast and meals.  She could communicate her basic needs.  Worked on word-finding difficulties and speech intelligibility.  Full family teaching was completed and planned discharge to home.  DISCHARGE MEDICATIONS: 1. Amaryl 4 mg p.o. at bedtime. 2. Xalatan ophthalmic solution 0.05% 1 drop both eyes at bedtime. 3. Keppra 1250 mg p.o. every 12 hours. 4. Lisinopril 20 mg p.o. daily. 5. Lopressor 25 mg p.o. b.i.d. 6. Dulera 200 - 5 mcg 2 puffs twice daily. 7. Protonix 40 mg p.o. at bedtime. 8. Thyroid 60 mg p.o. at bedtime. 9. Verapamil 40 mg p.o. t.i.d.  DIET:  Mechanical, soft, thin liquids.  She would follow up with Dr. Alger Simons at the outpatient rehab service office as directed; Dr. [name] [facility] in 2 weeks, call for appointment; Dr. Lavone Orn, medical management.  Ongoing therapies arranged as per rehab services.     Lauraine Rinne, P.A.     DA/MEDQ  D:  03/05/2016  T:  03/05/2016  Job:  [id number]  cc:   Delanna Ahmadi, M.D.

## 2016-03-06 DIAGNOSIS — S065X3S Traumatic subdural hemorrhage with loss of consciousness of 1 hour to 5 hours 59 minutes, sequela: Secondary | ICD-10-CM

## 2016-03-06 DIAGNOSIS — R569 Unspecified convulsions: Secondary | ICD-10-CM | POA: Diagnosis not present

## 2016-03-06 DIAGNOSIS — R1310 Dysphagia, unspecified: Secondary | ICD-10-CM | POA: Diagnosis not present

## 2016-03-06 DIAGNOSIS — F09 Unspecified mental disorder due to known physiological condition: Secondary | ICD-10-CM | POA: Diagnosis not present

## 2016-03-06 LAB — GLUCOSE, CAPILLARY: Glucose-Capillary: 125 mg/dL — ABNORMAL HIGH (ref 65–99)

## 2016-03-06 MED ORDER — LEVETIRACETAM 100 MG/ML PO SOLN: 1250.0000 mg | Freq: Two times a day (BID) | ORAL | Status: DC

## 2016-03-06 MED ORDER — PANTOPRAZOLE SODIUM 40 MG PO TBEC: 40.0000 mg | DELAYED_RELEASE_TABLET | Freq: Every day | ORAL | Status: DC

## 2016-03-06 MED ORDER — LISINOPRIL 20 MG PO TABS: 20.0000 mg | ORAL_TABLET | Freq: Every day | ORAL | Status: AC

## 2016-03-06 MED ORDER — METOPROLOL TARTRATE 25 MG PO TABS: 25.0000 mg | ORAL_TABLET | Freq: Two times a day (BID) | ORAL | Status: AC

## 2016-03-06 MED ORDER — FLUCONAZOLE 40 MG/ML PO SUSR: ORAL | Status: DC

## 2016-03-06 MED ORDER — PANTOPRAZOLE SODIUM 40 MG PO TBEC: 40.0000 mg | DELAYED_RELEASE_TABLET | Freq: Every day | ORAL | Status: AC

## 2016-03-06 MED ORDER — GLIMEPIRIDE 2 MG PO TABS: 4.0000 mg | ORAL_TABLET | Freq: Every day | ORAL | Status: AC

## 2016-03-06 MED ORDER — LEVETIRACETAM 250 MG PO TABS: 1250.0000 mg | ORAL_TABLET | Freq: Two times a day (BID) | ORAL | Status: DC

## 2016-03-06 MED ORDER — PANTOPRAZOLE SODIUM 40 MG PO PACK: 40.0000 mg | PACK | Freq: Every day | ORAL | Status: DC

## 2016-03-06 MED ORDER — LEVETIRACETAM 750 MG PO TABS: 1250.0000 mg | ORAL_TABLET | Freq: Two times a day (BID) | ORAL | Status: DC

## 2016-03-06 NOTE — Progress Notes (Signed)
PHYSICAL MEDICINE & REHABILITATION     PROGRESS NOTE    Subjective/Complaints: Up in bed. Excited to be going home today!  ROS still somewhat limited by aphasia and dysarthria   Objective: Vital Signs: Blood pressure 154/70, pulse 60, temperature 98.4 F (36.9 C), temperature source Oral, resp. rate 20, height 5\' 6"  (1.676 m), weight 82.6 kg (182 lb 1.6 oz), last menstrual period 03/24/2010, SpO2 96 %. No results found. No results for input(s): WBC, HGB, HCT, PLT in the last 72 hours. No results for input(s): NA, K, CL, GLUCOSE, BUN, CREATININE, CALCIUM in the last 72 hours.  Invalid input(s): CO CBG (last 3)   Recent Labs  03/05/16 1700 03/05/16 2035 03/06/16 0642  GLUCAP 115* 102* 125*    Wt Readings from Last 3 Encounters:  03/06/16 82.6 kg (182 lb 1.6 oz)  02/26/16 90.9 kg (200 lb 6.4 oz)  02/05/16 90.3 kg (199 lb 1.2 oz)    Physical Exam:  Constitutional: She appears well-developed and well-nourished.  HENT:  Craniotomy site clean and dry--staples out. Nasogastric tube in place  Eyes: Conjunctivae are normal.  Pupils reactive to light  Neck: Normal range of motion. Neck supple. No thyromegaly present.  Cardiovascular: Normal rate and regular rhythm.  Respiratory: Effort normal and breath sounds normal. No respiratory distress.  GI: Soft. Bowel sounds are normal. She exhibits no distension.  Musculoskeletal: She exhibits no edema or tenderness.  Neurological: She is alert. Aphasic/dysarthric   DTRs symmetric Engaging right side more.  Skin: Skin is warm and dry.  Psychiatric:  flat  Assessment/Plan: 1. 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 Physiatrist is providing close team supervision and 24 hour management of active medical problems listed below. Physiatrist and rehab team continue to assess barriers to  discharge/monitor patient progress toward functional and medical goals.  Function:  Bathing Bathing position   Position: Shower  Bathing parts Body parts bathed by patient: Right arm, Left arm, Chest, Abdomen, Right upper leg, Left upper leg, Buttocks, Front perineal area, Right lower leg, Left lower leg Body parts bathed by helper: Right lower leg, Left lower leg  Bathing assist Assist Level: Set up      Upper Body Dressing/Undressing Upper body dressing   What is the patient wearing?: Button up shirt     Pull over shirt/dress - Perfomed by patient: Thread/unthread right sleeve, Thread/unthread left sleeve, Pull shirt over trunk Pull over shirt/dress - Perfomed by helper: Put head through opening Button up shirt - Perfomed by patient: Thread/unthread right sleeve, Thread/unthread left sleeve, Button/unbutton shirt, Pull shirt around back Button up shirt - Perfomed by helper: Pull shirt around back    Upper body assist Assist Level: Set up   Set up : To obtain clothing/put away  Lower Body Dressing/Undressing Lower body dressing   What is the patient wearing?: Non-skid slipper socks, Underwear Underwear - Performed by patient: Thread/unthread right underwear leg, Thread/unthread left underwear leg, Pull underwear up/down Underwear - Performed by helper: Thread/unthread right underwear leg, Thread/unthread left underwear leg     Non-skid slipper socks- Performed by patient: Don/doff right sock, Don/doff left sock Non-skid slipper socks- Performed by helper: Don/doff right sock, Don/doff left sock                  Lower body assist Assist for lower body dressing: Set up      Child psychotherapist  steps completed by patient: Adjust clothing prior to toileting, Performs perineal hygiene, Adjust clothing after toileting Toileting steps completed by helper: Adjust clothing prior to toileting Toileting Assistive Devices: Grab bar or rail  Toileting assist Assist  level: Supervision or verbal cues   Transfers Chair/bed transfer   Chair/bed transfer method: Ambulatory Chair/bed transfer assist level: Supervision or verbal cues Chair/bed transfer assistive device: Walker, Air cabin crew     Max distance: 150 ft Assist level: Supervision or verbal cues   Wheelchair   Type: Manual   Assist Level: Dependent (Pt equals 0%)  Cognition Comprehension Comprehension assist level: Understands basic 90% of the time/cues < 10% of the time  Expression Expression assist level: Expresses basic 50 - 74% of the time/requires cueing 25 - 49% of the time. Needs to repeat parts of sentences.  Social Interaction Social Interaction assist level: Interacts appropriately 90% of the time - Needs monitoring or encouragement for participation or interaction.  Problem Solving Problem solving assist level: Solves basic 90% of the time/requires cueing < 10% of the time  Memory Memory assist level: Recognizes or recalls 90% of the time/requires cueing < 10% of the time     Medical Problem List and Plan: 1. Right hemiparesthesias/aphasia/dysphasia secondary to traumatic subdural hematoma status post left craniotomy evacuated subdural hematoma with reaccumulation of fluid craniotomy 2  --dc home today!  Patient to see me in the office for transitional care encounter in 1-2 weeks. 2. DVT Prophylaxis/Anticoagulation: mobility 3. Pain Management: Tylenol as needed 4. Dysphagia. Now on Dysphagia #2 thin liquids.     -off TF. Ate 100% yesterday.   -removed NGT/, dc'ed IVF 5. Neuropsych: This patient is not capable of making decisions on her own behalf. 6. Skin/Wound Care: removed staples 7. Fluids/Electrolytes/Nutrition: Routine I&O with follow-up chemistries 8. Seizure disorder. Keppra 1250 mg every 12 hours.   -continue at current dose with sedation after dosing however.  9. Hypertension. Lisinopril 20 mg daily at bedtime, verapamil 40 mg 3 times a  day,Lopressor 25 mg BID.  -controlled at present 10. Diabetes mellitus. Hemoglobin A1c 6.7. Amaryl 4 mg daily at bedtime. Check blood sugars before meals and at bedtime  -continued good control--adjust regimen at home 11. COPD. Continue Dulera and nebulizers as directed 12. UTI. 100k yeast---changed to diflucan---7 day course through sunday 13. Hypothyroidism. Thyroid 60 mg daily at bedtime. Latest TSH 0.285    LOS (Days) 9 A FACE TO FACE EVALUATION WAS PERFORMED  SWARTZ,ZACHARY T 03/06/2016 9:15 AM

## 2016-03-06 NOTE — Progress Notes (Signed)
Patient discharged to home, accompanied by her daughter.

## 2016-03-06 NOTE — Discharge Instructions (Signed)
Inpatient Rehab Discharge Instructions  Carolyn [name] Discharge date and time: No discharge date for patient encounter.   Activities/Precautions/ Functional Status: Activity: activity as tolerated Diet: Mechanical soft Wound Care: keep wound clean and dry Functional status:  ___ No restrictions     ___ Walk up steps independently ___ 24/7 supervision/assistance   ___ Walk up steps with assistance ___ Intermittent supervision/assistance  ___ Bathe/dress independently ___ Walk with walker     _x__ Bathe/dress with assistance ___ Walk Independently    ___ Shower independently ___ Walk with assistance    ___ Shower with assistance ___ No alcohol     ___ Return to work/school ________  Special Instructions:    COMMUNITY REFERRALS UPON DISCHARGE:    Home Health:   PT, OT, SP, RN  Agency:[facility]   Date of last service:03/06/2016  Medical Equipment/Items Ordered:HAS ALL NEEDED EQUIPMENT    My questions have been answered and I understand these instructions. I will adhere to these goals and the provided educational materials after my discharge from the hospital.  Patient/Caregiver Signature _______________________________ Date __________  Clinician Signature _______________________________________ Date __________  Please bring this form and your medication list with you to all your follow-up doctor's appointments.

## 2016-03-06 NOTE — Progress Notes (Signed)
Patient became tearful upon talking about discharge today; stated "I can't believe I've made it here." Patient given emotional support. Patient thankful for care given and laughed before RN leaving. Will continue to monitor.

## 2016-03-06 NOTE — Progress Notes (Signed)
Speech Language Pathology Discharge Summary  Patient Details  Name: Carolyn [name] MRN: [id number] Date of Birth: May 22, 1933  Patient has met  41 of  12 long term goals.  Patient to discharge at overall Mod;Supervision level.   Reasons goals not met: N/A    Clinical Impression/Discharge Summary: Patient has made functional gains and has met 12 of 12 LTG's this admission due to improved swallowing and cognitive-linguistic function. Currently, patient is consuming Dys. 3 textures with thin liquids without overt s/s of aspiration and requires intermittent supervision verbal cues for use of swallowing compensatory strategies. Patient is demonstrating behaviors consistent with a Rancho Level VIII and requires overall supervision verbal cues to complete functional and familiar tasks safely in regards to attention, recall, problem solving and awareness.  Patient also requires overall Mod A verbal and question cues as well as extra time for use of speech intelligibility strategies to maximize intelligibility at the phrase level to ~50-75%. Patient can name functional items with 100% accuracy and can follow 1 and 2 step commands with 100% accuracy. This clinician suspects patient's functional communication is most impacted by her intelligibility rather than her word-finding. Patient can also demonstrate reading comprehension at the word and phrase level with 100% accuracy with moderate sized information due to decreased visual acuity. Patient and family education is complete and handouts were given to reinforce information. Patient will discharge home with 24 hour supervision from family and would benefit from f/u SLP services to maximize her swallowing and cognitive-linguistic function as well as her speech intelligibility in order to maximize her overall functional independence and reduce caregiver burden.    Care Partner:  Caregiver Able to Provide Assistance: Yes  Type of Caregiver Assistance:  Physical;Cognitive  Recommendation:  Home Health SLP;24 hour supervision/assistance  Rationale for SLP Follow Up: Maximize functional communication;Maximize swallowing safety;Maximize cognitive function and independence;Reduce caregiver burden   Equipment: N/A    Reasons for discharge: Treatment goals met;Discharged from hospital   Patient/Family Agrees with Progress Made and Goals Achieved: Yes   [place], [place] 03/06/2016, 3:05 PM

## 2016-03-06 NOTE — Progress Notes (Signed)
Social Work  Discharge Note  The overall goal for the admission was met for:   Discharge location: Yes-HOME WITH DAUGHTER-[name]-24 HR  Length of Stay: Yes-9 DAYS  Discharge activity level: Yes-SUPERVISION LEVEL  Home/community participation: Yes  Services provided included: MD, RD, PT, OT, SLP, RN, CM, TR, Pharmacy and SW  Financial Services: Private Insurance: [place]  Follow-up services arranged: Home Health: [facility]-PT,OT,RN,SP and Patient/Family request agency HH: [facility], DME: NO NEEDS  Comments (or additional information):DAUGHTER HERE TO ATTEND THERAPIES AND HAS LEARNED HER CARE, BOTH FEEL READY FOR DISCHARGE  Patient/Family verbalized understanding of follow-up arrangements: Yes  Individual responsible for coordination of the follow-up plan: [name]-DAUGHTER  Confirmed correct DME delivered: Elease Hashimoto 03/06/2016    Elease Hashimoto

## 2016-03-07 DIAGNOSIS — Z7984 Long term (current) use of oral hypoglycemic drugs: Secondary | ICD-10-CM | POA: Diagnosis not present

## 2016-03-07 DIAGNOSIS — G8191 Hemiplegia, unspecified affecting right dominant side: Secondary | ICD-10-CM | POA: Diagnosis not present

## 2016-03-07 DIAGNOSIS — Z905 Acquired absence of kidney: Secondary | ICD-10-CM | POA: Diagnosis not present

## 2016-03-07 DIAGNOSIS — H409 Unspecified glaucoma: Secondary | ICD-10-CM | POA: Diagnosis not present

## 2016-03-07 DIAGNOSIS — G40909 Epilepsy, unspecified, not intractable, without status epilepticus: Secondary | ICD-10-CM | POA: Diagnosis not present

## 2016-03-07 DIAGNOSIS — W1839XD Other fall on same level, subsequent encounter: Secondary | ICD-10-CM | POA: Diagnosis not present

## 2016-03-07 DIAGNOSIS — Z87440 Personal history of urinary (tract) infections: Secondary | ICD-10-CM | POA: Diagnosis not present

## 2016-03-07 DIAGNOSIS — E039 Hypothyroidism, unspecified: Secondary | ICD-10-CM | POA: Diagnosis not present

## 2016-03-07 DIAGNOSIS — Z87891 Personal history of nicotine dependence: Secondary | ICD-10-CM | POA: Diagnosis not present

## 2016-03-07 DIAGNOSIS — J449 Chronic obstructive pulmonary disease, unspecified: Secondary | ICD-10-CM | POA: Diagnosis not present

## 2016-03-07 DIAGNOSIS — E119 Type 2 diabetes mellitus without complications: Secondary | ICD-10-CM | POA: Diagnosis not present

## 2016-03-07 DIAGNOSIS — I10 Essential (primary) hypertension: Secondary | ICD-10-CM | POA: Diagnosis not present

## 2016-03-07 DIAGNOSIS — S065X0S Traumatic subdural hemorrhage without loss of consciousness, sequela: Secondary | ICD-10-CM | POA: Diagnosis not present

## 2016-03-07 DIAGNOSIS — Z85528 Personal history of other malignant neoplasm of kidney: Secondary | ICD-10-CM | POA: Diagnosis not present

## 2016-03-07 DIAGNOSIS — R4701 Aphasia: Secondary | ICD-10-CM | POA: Diagnosis not present

## 2016-03-07 DIAGNOSIS — S52122D Displaced fracture of head of left radius, subsequent encounter for closed fracture with routine healing: Secondary | ICD-10-CM | POA: Diagnosis not present

## 2016-03-07 DIAGNOSIS — Z9181 History of falling: Secondary | ICD-10-CM | POA: Diagnosis not present

## 2016-03-11 ENCOUNTER — Telehealth: Payer: Self-pay | Admitting: *Deleted

## 2016-03-11 DIAGNOSIS — R569 Unspecified convulsions: Secondary | ICD-10-CM | POA: Diagnosis not present

## 2016-03-11 DIAGNOSIS — R630 Anorexia: Secondary | ICD-10-CM | POA: Diagnosis not present

## 2016-03-11 DIAGNOSIS — E119 Type 2 diabetes mellitus without complications: Secondary | ICD-10-CM | POA: Diagnosis not present

## 2016-03-11 DIAGNOSIS — I6200 Nontraumatic subdural hemorrhage, unspecified: Secondary | ICD-10-CM | POA: Diagnosis not present

## 2016-03-11 DIAGNOSIS — Z7984 Long term (current) use of oral hypoglycemic drugs: Secondary | ICD-10-CM | POA: Diagnosis not present

## 2016-03-11 DIAGNOSIS — I10 Essential (primary) hypertension: Secondary | ICD-10-CM | POA: Diagnosis not present

## 2016-03-11 DIAGNOSIS — I493 Ventricular premature depolarization: Secondary | ICD-10-CM | POA: Diagnosis not present

## 2016-03-11 NOTE — Telephone Encounter (Addendum)
Call #1 (4:13 pm 03/11/16)--left message to call office Call #2 completed.  She does not want to come to the appt scheduled for Dr Naaman Plummer  Transitional Care Questions   1. Are you/is patient experiencing any problems since coming home? Are there any questions regarding any aspect of care?No questions.  She does have some drooling but ST has been out and checked her. 2. Are there any questions regarding medications administration/dosing? Are meds being taken as prescribed? Patient should review meds with caller to confirm  Has medications and taking them as prescribed. Sleeping a lot ?from [place]? 3. Have there been any falls? No 4. Has Home Health been to the house and/or have they contacted you? If not, have you tried to contact them? Can we help you contact them? Yes RN has been out x2, Pt x1 and OT has not se t up appt yet. ST has been out 5. Are bowels and bladder emptying properly? Are there any unexpected incontinence issues? If applicable, is patient following bowel/bladder programs? yes 6. Any fevers, problems with breathing, unexpected pain? No 7. Are there any skin problems or new areas of breakdown? No  8. Has the patient/family member arranged specialty MD follow up (ie cardiology/neurology/renal/surgical/etc)?  Can we help arrange? Has appts scheduled.  Saw PCP yesterday, Surgeon is on Monday. Does not want the appt with Dr Tonye Pearson not feel they need at this time.  Sees Dr Linard Millers next week 9. Does the patient need any other services or support that we can help arrange? No 10. Are caregivers following through as expected in assisting the patient? Yes             Has the patient quit smoking, drinking alcohol, or using drugs as recommended? N/A  Appointment 03/17/2016 Arrive  10:30 for 11:00 with Dr Altha Harm has been canceled per daughters request.

## 2016-03-13 ENCOUNTER — Emergency Department (HOSPITAL_COMMUNITY)
Admission: EM | Admit: 2016-03-13 | Discharge: 2016-03-13 | Disposition: A | Discharge status: Home / Self Care | Payer: PPO | Attending: Emergency Medicine | Admitting: Emergency Medicine

## 2016-03-13 ENCOUNTER — Encounter (HOSPITAL_COMMUNITY): Payer: Self-pay

## 2016-03-13 ENCOUNTER — Emergency Department (HOSPITAL_COMMUNITY): Payer: PPO

## 2016-03-13 DIAGNOSIS — E039 Hypothyroidism, unspecified: Secondary | ICD-10-CM | POA: Insufficient documentation

## 2016-03-13 DIAGNOSIS — Z9889 Other specified postprocedural states: Secondary | ICD-10-CM | POA: Diagnosis not present

## 2016-03-13 DIAGNOSIS — J449 Chronic obstructive pulmonary disease, unspecified: Secondary | ICD-10-CM | POA: Insufficient documentation

## 2016-03-13 DIAGNOSIS — I129 Hypertensive chronic kidney disease with stage 1 through stage 4 chronic kidney disease, or unspecified chronic kidney disease: Secondary | ICD-10-CM | POA: Diagnosis not present

## 2016-03-13 DIAGNOSIS — H409 Unspecified glaucoma: Secondary | ICD-10-CM | POA: Diagnosis not present

## 2016-03-13 DIAGNOSIS — N189 Chronic kidney disease, unspecified: Secondary | ICD-10-CM | POA: Diagnosis not present

## 2016-03-13 DIAGNOSIS — R4789 Other speech disturbances: Secondary | ICD-10-CM | POA: Diagnosis not present

## 2016-03-13 DIAGNOSIS — R471 Dysarthria and anarthria: Secondary | ICD-10-CM | POA: Insufficient documentation

## 2016-03-13 DIAGNOSIS — R519 Headache, unspecified: Secondary | ICD-10-CM

## 2016-03-13 DIAGNOSIS — Z7951 Long term (current) use of inhaled steroids: Secondary | ICD-10-CM | POA: Diagnosis not present

## 2016-03-13 DIAGNOSIS — Z87891 Personal history of nicotine dependence: Secondary | ICD-10-CM | POA: Insufficient documentation

## 2016-03-13 DIAGNOSIS — R51 Headache: Secondary | ICD-10-CM | POA: Diagnosis not present

## 2016-03-13 DIAGNOSIS — R569 Unspecified convulsions: Secondary | ICD-10-CM | POA: Insufficient documentation

## 2016-03-13 DIAGNOSIS — Z7984 Long term (current) use of oral hypoglycemic drugs: Secondary | ICD-10-CM | POA: Insufficient documentation

## 2016-03-13 DIAGNOSIS — E119 Type 2 diabetes mellitus without complications: Secondary | ICD-10-CM | POA: Diagnosis not present

## 2016-03-13 DIAGNOSIS — R4781 Slurred speech: Secondary | ICD-10-CM | POA: Diagnosis not present

## 2016-03-13 LAB — COMPREHENSIVE METABOLIC PANEL
ALT: 13 U/L — ABNORMAL LOW (ref 14–54)
AST: 16 U/L (ref 15–41)
Albumin: 3.8 g/dL (ref 3.5–5.0)
Alkaline Phosphatase: 77 U/L (ref 38–126)
Anion gap: 10 (ref 5–15)
BUN: 7 mg/dL (ref 6–20)
CO2: 23 mmol/L (ref 22–32)
Calcium: 10.6 mg/dL — ABNORMAL HIGH (ref 8.9–10.3)
Chloride: 105 mmol/L (ref 101–111)
Creatinine, Ser: 0.72 mg/dL (ref 0.44–1.00)
GFR calc Af Amer: >60 mL/min (ref >60)
GFR calc non Af Amer: >60 mL/min (ref >60)
Glucose, Bld: 147 mg/dL — ABNORMAL HIGH (ref 65–99)
Potassium: 3.7 mmol/L (ref 3.5–5.1)
Sodium: 138 mmol/L (ref 135–145)
Total Bilirubin: 0.8 mg/dL (ref 0.3–1.2)
Total Protein: 7 g/dL (ref 6.5–8.1)

## 2016-03-13 LAB — CBC
HCT: 39.7 % (ref 36.0–46.0)
Hemoglobin: 12.8 g/dL (ref 12.0–15.0)
MCH: 28.4 pg (ref 26.0–34.0)
MCHC: 32.2 g/dL (ref 30.0–36.0)
MCV: 88 fL (ref 78.0–100.0)
Platelets: 205 10*3/uL (ref 150–400)
RBC: 4.51 MIL/uL (ref 3.87–5.11)
RDW: 15.4 % (ref 11.5–15.5)
WBC: 8.5 10*3/uL (ref 4.0–10.5)

## 2016-03-13 LAB — I-STAT CHEM 8, ED
BUN: 9 mg/dL (ref 6–20)
Calcium, Ion: 1.31 mmol/L — ABNORMAL HIGH (ref 1.13–1.30)
Chloride: 104 mmol/L (ref 101–111)
Creatinine, Ser: 0.6 mg/dL (ref 0.44–1.00)
Glucose, Bld: 145 mg/dL — ABNORMAL HIGH (ref 65–99)
HCT: 41 % (ref 36.0–46.0)
Hemoglobin: 13.9 g/dL (ref 12.0–15.0)
Potassium: 3.7 mmol/L (ref 3.5–5.1)
Sodium: 140 mmol/L (ref 135–145)
TCO2: 24 mmol/L (ref 0–100)

## 2016-03-13 LAB — DIFFERENTIAL
Basophils Absolute: 0.1 10*3/uL (ref 0.0–0.1)
Basophils Relative: 1 %
Eosinophils Absolute: 0.6 10*3/uL (ref 0.0–0.7)
Eosinophils Relative: 8 %
Lymphocytes Relative: 43 %
Lymphs Abs: 3.6 10*3/uL (ref 0.7–4.0)
Monocytes Absolute: 0.7 10*3/uL (ref 0.1–1.0)
Monocytes Relative: 8 %
Neutro Abs: 3.5 10*3/uL (ref 1.7–7.7)
Neutrophils Relative %: 41 %

## 2016-03-13 LAB — CBG MONITORING, ED: Glucose-Capillary: 149 mg/dL — ABNORMAL HIGH (ref 65–99)

## 2016-03-13 LAB — I-STAT TROPONIN, ED: Troponin i, poc: 0 ng/mL (ref 0.00–0.08)

## 2016-03-13 LAB — PROTIME-INR
INR: 1.08 (ref 0.00–1.49)
Prothrombin Time: 14.2 seconds (ref 11.6–15.2)

## 2016-03-13 LAB — APTT: aPTT: 29 seconds (ref 24–37)

## 2016-03-13 NOTE — Discharge Instructions (Signed)

## 2016-03-13 NOTE — ED Notes (Addendum)
Pt brought in by daughter due to pt having severe HA and worsening slurred speech. Pt has been complaining of a severe LEFT sided headache that started today, slurred speech worsened last night. Hx of subdural head bleed from a fall in March and had a craniotomy. Bilateral equal hand grasps, no arm of leg drift but pts speech is currently slurred. LSN last night but does not remember what time last night she noticed the change in her speech. Pt also reports nausea and had one episode of emesis yesterday afternoon.

## 2016-03-13 NOTE — ED Notes (Signed)
MD aware patients family would like to speak with him about results.

## 2016-03-13 NOTE — ED Notes (Signed)
Patient returned to room RN obtained vitals.

## 2016-03-13 NOTE — ED Provider Notes (Signed)
CSN: [id number]     Arrival date & time 03/13/16  1903 History   First MD Initiated Contact with Patient 03/13/16 1919     Chief Complaint  Patient presents with  . Headache  . Aphasia     (Consider location/radiation/quality/duration/timing/severity/associated sxs/prior Treatment) HPI 82 y.o. female with a hx of recent admission in early April for subdural bleeding s/p craniotomy x3 for hematoma evacuation left with residual slurred speech and slight difficulty swallowing, currently undergoing physical therapy and speech therapy presents to the ED with her daughter, with whom she lives and has been with her throughout her whole disease course noting a perceived subtle worsening of her slurred speech last night and she had a brief sharp and transient left sided headache similar to that which she had previously with the onset of her stroke. She denies any current sx but given similar sx they present to the ED just to make sure that everything is ok. She denies any other new focal neurologic deficits. No trauma. She has been reportedly compliant with her rx'd medications and rehab regimen.    Past Medical History  Diagnosis Date  . Diabetes mellitus   . Hypertension   . Hypercholesterolemia   . Gallstones   . Glaucoma   . Cataracts, bilateral   . COPD (chronic obstructive pulmonary disease) ([place])   . Hypothyroid   . PONV (postoperative nausea and vomiting)   . Asthma   . Chronic kidney disease 2007    kidney cancer and partial nephrctomy-right  . Subdural bleeding [facility])    Past Surgical History  Procedure Laterality Date  . Dilation and curettage of uterus    . Partial nephrectomy      cancer  . Breast biopsy    . D&c for enlarged uterine  210  . Total abdominal hysterectomy w/ bilateral salpingoophorectomy  2011  . Cateract extractions and iol    . Cholecystectomy  2008  . Abdominal hysterectomy    . Eye surgery  08/2011,09/2012    bilateral cataracts  . Hernia repair   01/19/12    inguinal  . Inguinal hernia repair  01/19/2012    Procedure: HERNIA REPAIR INGUINAL ADULT;  Surgeon: Earnstine Regal, MD;  Location: [facility];  Service: General;  Laterality: Left;  repair left inguinal hernia with mesh   . Craniotomy Left 02/06/2016    Procedure: CRANIOTOMY HEMATOMA EVACUATION SUBDURAL;  Surgeon: Kevan Ny Ditty, MD;  Location: [facility];  Service: Neurosurgery;  Laterality: Left;  . Craniotomy N/A 02/17/2016    Procedure: CRANIOTOMY HEMATOMA EVACUATION SUBDURAL;  Surgeon: Kevan Ny Ditty, MD;  Location: [place] NEUR[facility] ORS;  Service: Neurosurgery;  Laterality: N/A;  left  . Craniotomy Left 02/18/2016    Procedure: CRANIOTOMY HEMATOMA EVACUATION SUBDURAL;  Surgeon: Earnie Larsson, MD;  Location: [place] [facility]UR[facility] ORS;  Service: Neurosurgery;  Laterality: Left;   Family History  Problem Relation Age of Onset  . Cancer     Social History  Substance Use Topics  . Smoking status: Former Smoker -- 1.00 packs/day for 5 years    Types: Cigarettes    Quit date: 01/11/1982  . Smokeless tobacco: Never Used     Comment: quit maybe 59yrs  . Alcohol Use: No   OB History    No data available     Review of Systems  Constitutional: Negative for fever, chills, activity change and appetite change.  HENT: Negative for congestion and rhinorrhea.   Respiratory: Negative for chest tightness and shortness of breath.  Cardiovascular: Negative for chest pain.  Gastrointestinal: Negative for abdominal pain.  Genitourinary: Negative for dysuria, urgency, frequency and difficulty urinating.  Musculoskeletal: Negative for back pain and neck pain.  Skin: Negative for rash.  Neurological: Positive for seizures, speech difficulty and headaches. Negative for dizziness, syncope, weakness, light-headedness and numbness.  All other systems reviewed and are negative.     Allergies  Darvocet; Metformin and related; Percocet; Tramadol; and Vicodin  Home Medications   Prior to Admission  medications   Medication Sig Start Date End Date Taking? Authorizing Provider  Fluticasone-Salmeterol (ADVAIR) 250-50 MCG/DOSE AEPB Inhale 1 puff into the lungs 2 (two) times daily as needed (for wheezing/shortness of breath).    Yes Historical Provider, MD  glimepiride (AMARYL) 2 MG tablet Take 2 tablets (4 mg total) by mouth at bedtime. 03/06/16  Yes Daniel J Angiulli, PA-C  latanoprost (XALATAN) 0.005 % ophthalmic solution Place 1 drop into both eyes at bedtime.    Yes Historical Provider, MD  levETIRAcetam (KEPPRA) 100 MG/ML solution Place 12.5 mLs (1,250 mg total) into feeding tube every 12 (twelve) hours. Patient taking differently: Take 1,250 mg by mouth every 12 (twelve) hours.  03/06/16  Yes Daniel J Angiulli, PA-C  levETIRAcetam (KEPPRA) 250 MG tablet Take 5 tablets (1,250 mg total) by mouth 2 (two) times daily. 03/06/16  Yes Daniel J Angiulli, PA-C  lisinopril (PRINIVIL,ZESTRIL) 20 MG tablet Take 1 tablet (20 mg total) by mouth at bedtime. 03/06/16  Yes Daniel J Angiulli, PA-C  metoprolol tartrate (LOPRESSOR) 25 MG tablet Take 1 tablet (25 mg total) by mouth 2 (two) times daily. 03/06/16  Yes Daniel J Angiulli, PA-C  pantoprazole (PROTONIX) 40 MG tablet Take 1 tablet (40 mg total) by mouth daily. 03/06/16  Yes Daniel J Angiulli, PA-C  pirbuterol (MAXAIR) 200 MCG/INH inhaler Inhale 2 puffs into the lungs 4 (four) times daily as needed for wheezing or shortness of breath.    Yes Historical Provider, MD  polyethylene glycol (MIRALAX / GLYCOLAX) packet Take 17 g by mouth daily as needed for mild constipation.    Yes Historical Provider, MD  theophylline (UNIPHYL) 400 MG 24 hr tablet Take 400 mg by mouth at bedtime.    Yes Historical Provider, MD  thyroid (ARMOUR) 60 MG tablet Take 60 mg by mouth at bedtime.    Yes Historical Provider, MD  verapamil (CALAN-SR) 180 MG CR tablet Take 180 mg by mouth at bedtime.   Yes Historical Provider, MD   BP 144/79 mmHg  Pulse 76  Temp(Src) 98.9 F (37.2 C)  (Oral)  Resp 18  SpO2 97%  LMP 03/24/2010 Physical Exam  Constitutional: She is oriented to person, place, and time. She appears well-developed and well-nourished. No distress.  HENT:  Head: Normocephalic and atraumatic.  Nose: Nose normal.  Mouth/Throat: Oropharynx is clear and moist.  Eyes: Conjunctivae are normal. Pupils are equal, round, and reactive to light.  Neck: Neck supple.  Cardiovascular: Normal rate, regular rhythm, normal heart sounds and intact distal pulses.   Pulmonary/Chest: Effort normal and breath sounds normal.  Abdominal: Soft. She exhibits no distension. There is no tenderness.  Musculoskeletal: She exhibits no edema or tenderness.  Neurological: She is alert and oriented to person, place, and time. She has normal strength. No sensory deficit. Coordination normal. GCS eye subscore is 4. GCS verbal subscore is 5. GCS motor subscore is 6.  Slurred speech, no facial droop or other sensory deficit. Intact finger to nose.  Skin: Skin is warm and dry. She  is not diaphoretic.  Nursing note and vitals reviewed.   ED Course  Procedures (including critical care time) Labs Review Labs Reviewed  COMPREHENSIVE METABOLIC PANEL - Abnormal; Notable for the following:    Glucose, Bld 147 (*)    Calcium 10.6 (*)    ALT 13 (*)    All other components within normal limits  CBG MONITORING, ED - Abnormal; Notable for the following:    Glucose-Capillary 149 (*)    All other components within normal limits  I-STAT CHEM 8, ED - Abnormal; Notable for the following:    Glucose, Bld 145 (*)    Calcium, Ion 1.31 (*)    All other components within normal limits  PROTIME-INR  APTT  CBC  DIFFERENTIAL  I-STAT TROPOININ, ED    Imaging Review Ct Head Wo Contrast  03/13/2016  CLINICAL DATA:  Slurred speech. History of subdural hematoma and craniotomies. EXAM: CT HEAD WITHOUT CONTRAST TECHNIQUE: Contiguous axial images were obtained from the base of the skull through the vertex  without intravenous contrast. COMPARISON:  For 417 FINDINGS: Left frontoparietal craniotomy is again noted. Skin staples have been removed, and left scalp fluid and swelling have resolved. There is minimal residual extra-axial fluid subjacent to the craniotomy measuring up to 5 mm in thickness, decreased from prior. Midline shift and mass effect on the left lateral ventricle have resolved. Subdural blood along the falx and tentorium on the prior CT has resolved. A posterior left frontal lobe infarct is again seen in makes stent slightly greater superiorly toward the vertex than on the prior study. There is no evidence of new infarct elsewhere. There is no evidence of acute intracranial hemorrhage or mass. Overall cerebral volume is within normal limits for age. Prior bilateral cataract extraction is noted. There remains complete opacification of the frontal sinuses and rather extensive bilateral ethmoid air cell opacification. Left maxillary sinus mucosal thickening has improved. Left sphenoid sinus mucous retention cyst also appears slightly smaller. The mastoid air cells are clear. Calcified atherosclerosis is noted at the skullbase. No acute osseous abnormality is identified. IMPRESSION: 1. Evolution/mild extension of posterior left frontal lobe infarct. 2. Trace residual extra-axial fluid over the left cerebral convexity. Resolved falcine and tentorial subdural blood. No new hemorrhage. 3. Resolved midline shift. Electronically Signed   By: Logan Bores M.D.   On: 03/13/2016 21:33   I have personally reviewed and evaluated these images and lab results as part of my medical decision-making.   EKG Interpretation   Date/Time:  Friday March 13 2016 19:26:20 EDT Ventricular Rate:  78 PR Interval:  135 QRS Duration: 149 QT Interval:  403 QTC Calculation: 459 R Axis:   4 Text Interpretation:  Sinus rhythm Premature atrial complexes Right bundle  branch block Abnormal ekg Confirmed by Carmin Muskrat  MD  [phone]) on  03/14/2016 10:27:30 AM      MDM  82 y.o. female with a hx of recent subdural hematoma s/p multiple craniotomies with residual dysarthria presents to the ED with her daughter noting a return of a transient sharp heft sided headache similar to that which she had before with the onset of her sx. Sx now resolved. Physical exam, as above with VSS, AF. Neuro exam is significant for dysarthria, but otherwise neurologically intact. 5/5 strength in her b/l UE and LE. No other significant cranial nerve deficits. Labs were drawn and shows no significant abnormalities. CT head was done and shows reassuring resolution of significant intracranial bleeding, no further midline shift, mild evolution  of left frontal lobe infarct but no acute abnormalities noted. These results were discussed with the patient and her daughter at the bedside, reassurance was given. She remained stable neurologically with no return of her sx. Feel that they became anxious about a return of her sx with the brief headache but given reassuring CT scan and exam do not feel that any acute process is at plan. Feel that she is stable for discharge home.  She was recommended to follow up closely, as scheduled with her PCP and neurosurgeon and to continue with her PT regimen. This plan was discussed at the bedside they stated both understanding and agreement.   Final diagnoses:  Nonintractable episodic headache, unspecified headache type  Dysarthria        Zenovia Jarred, DO 03/14/16 1810  Jola Schmidt, MD 03/15/16 0004

## 2016-03-17 ENCOUNTER — Encounter: Payer: PPO | Admitting: Physical Medicine & Rehabilitation

## 2016-03-17 DIAGNOSIS — Z87891 Personal history of nicotine dependence: Secondary | ICD-10-CM | POA: Diagnosis not present

## 2016-03-17 DIAGNOSIS — I10 Essential (primary) hypertension: Secondary | ICD-10-CM | POA: Diagnosis not present

## 2016-03-17 DIAGNOSIS — R4701 Aphasia: Secondary | ICD-10-CM | POA: Diagnosis not present

## 2016-03-17 DIAGNOSIS — G40909 Epilepsy, unspecified, not intractable, without status epilepticus: Secondary | ICD-10-CM | POA: Diagnosis not present

## 2016-03-17 DIAGNOSIS — E039 Hypothyroidism, unspecified: Secondary | ICD-10-CM | POA: Diagnosis not present

## 2016-03-17 DIAGNOSIS — S065X0S Traumatic subdural hemorrhage without loss of consciousness, sequela: Secondary | ICD-10-CM | POA: Diagnosis not present

## 2016-03-17 DIAGNOSIS — S52122D Displaced fracture of head of left radius, subsequent encounter for closed fracture with routine healing: Secondary | ICD-10-CM | POA: Diagnosis not present

## 2016-03-17 DIAGNOSIS — E119 Type 2 diabetes mellitus without complications: Secondary | ICD-10-CM | POA: Diagnosis not present

## 2016-03-17 DIAGNOSIS — W1839XD Other fall on same level, subsequent encounter: Secondary | ICD-10-CM | POA: Diagnosis not present

## 2016-03-17 DIAGNOSIS — G8191 Hemiplegia, unspecified affecting right dominant side: Secondary | ICD-10-CM | POA: Diagnosis not present

## 2016-03-17 DIAGNOSIS — Z9181 History of falling: Secondary | ICD-10-CM | POA: Diagnosis not present

## 2016-03-17 DIAGNOSIS — J449 Chronic obstructive pulmonary disease, unspecified: Secondary | ICD-10-CM | POA: Diagnosis not present

## 2016-03-17 DIAGNOSIS — H409 Unspecified glaucoma: Secondary | ICD-10-CM | POA: Diagnosis not present

## 2016-03-18 DIAGNOSIS — E119 Type 2 diabetes mellitus without complications: Secondary | ICD-10-CM | POA: Diagnosis not present

## 2016-03-18 DIAGNOSIS — W1839XD Other fall on same level, subsequent encounter: Secondary | ICD-10-CM | POA: Diagnosis not present

## 2016-03-18 DIAGNOSIS — S065X0S Traumatic subdural hemorrhage without loss of consciousness, sequela: Secondary | ICD-10-CM | POA: Diagnosis not present

## 2016-03-18 DIAGNOSIS — H409 Unspecified glaucoma: Secondary | ICD-10-CM | POA: Diagnosis not present

## 2016-03-18 DIAGNOSIS — J449 Chronic obstructive pulmonary disease, unspecified: Secondary | ICD-10-CM | POA: Diagnosis not present

## 2016-03-18 DIAGNOSIS — R4701 Aphasia: Secondary | ICD-10-CM | POA: Diagnosis not present

## 2016-03-18 DIAGNOSIS — E039 Hypothyroidism, unspecified: Secondary | ICD-10-CM | POA: Diagnosis not present

## 2016-03-18 DIAGNOSIS — I10 Essential (primary) hypertension: Secondary | ICD-10-CM | POA: Diagnosis not present

## 2016-03-18 DIAGNOSIS — Z87891 Personal history of nicotine dependence: Secondary | ICD-10-CM | POA: Diagnosis not present

## 2016-03-18 DIAGNOSIS — S52122D Displaced fracture of head of left radius, subsequent encounter for closed fracture with routine healing: Secondary | ICD-10-CM | POA: Diagnosis not present

## 2016-03-18 DIAGNOSIS — Z9181 History of falling: Secondary | ICD-10-CM | POA: Diagnosis not present

## 2016-03-18 DIAGNOSIS — G40909 Epilepsy, unspecified, not intractable, without status epilepticus: Secondary | ICD-10-CM | POA: Diagnosis not present

## 2016-03-18 DIAGNOSIS — G8191 Hemiplegia, unspecified affecting right dominant side: Secondary | ICD-10-CM | POA: Diagnosis not present

## 2016-03-19 DIAGNOSIS — H409 Unspecified glaucoma: Secondary | ICD-10-CM | POA: Diagnosis not present

## 2016-03-19 DIAGNOSIS — W1839XD Other fall on same level, subsequent encounter: Secondary | ICD-10-CM | POA: Diagnosis not present

## 2016-03-19 DIAGNOSIS — R4701 Aphasia: Secondary | ICD-10-CM | POA: Diagnosis not present

## 2016-03-19 DIAGNOSIS — J449 Chronic obstructive pulmonary disease, unspecified: Secondary | ICD-10-CM | POA: Diagnosis not present

## 2016-03-19 DIAGNOSIS — S065X0S Traumatic subdural hemorrhage without loss of consciousness, sequela: Secondary | ICD-10-CM | POA: Diagnosis not present

## 2016-03-19 DIAGNOSIS — G40909 Epilepsy, unspecified, not intractable, without status epilepticus: Secondary | ICD-10-CM | POA: Diagnosis not present

## 2016-03-19 DIAGNOSIS — S52122D Displaced fracture of head of left radius, subsequent encounter for closed fracture with routine healing: Secondary | ICD-10-CM | POA: Diagnosis not present

## 2016-03-19 DIAGNOSIS — E119 Type 2 diabetes mellitus without complications: Secondary | ICD-10-CM | POA: Diagnosis not present

## 2016-03-19 DIAGNOSIS — E039 Hypothyroidism, unspecified: Secondary | ICD-10-CM | POA: Diagnosis not present

## 2016-03-19 DIAGNOSIS — Z9181 History of falling: Secondary | ICD-10-CM | POA: Diagnosis not present

## 2016-03-19 DIAGNOSIS — G8191 Hemiplegia, unspecified affecting right dominant side: Secondary | ICD-10-CM | POA: Diagnosis not present

## 2016-03-19 DIAGNOSIS — Z87891 Personal history of nicotine dependence: Secondary | ICD-10-CM | POA: Diagnosis not present

## 2016-03-19 DIAGNOSIS — I10 Essential (primary) hypertension: Secondary | ICD-10-CM | POA: Diagnosis not present

## 2016-03-23 ENCOUNTER — Ambulatory Visit (INDEPENDENT_AMBULATORY_CARE_PROVIDER_SITE_OTHER): Payer: PPO | Admitting: Interventional Cardiology

## 2016-03-23 ENCOUNTER — Encounter: Payer: Self-pay | Admitting: Interventional Cardiology

## 2016-03-23 VITALS — BP 100/54 | HR 63 | Ht 66.0 in | Wt 216.0 lb

## 2016-03-23 DIAGNOSIS — S065XAA Traumatic subdural hemorrhage with loss of consciousness status unknown, initial encounter: Secondary | ICD-10-CM

## 2016-03-23 DIAGNOSIS — R001 Bradycardia, unspecified: Secondary | ICD-10-CM | POA: Insufficient documentation

## 2016-03-23 DIAGNOSIS — E7800 Pure hypercholesterolemia, unspecified: Secondary | ICD-10-CM | POA: Diagnosis not present

## 2016-03-23 DIAGNOSIS — I10 Essential (primary) hypertension: Secondary | ICD-10-CM

## 2016-03-23 DIAGNOSIS — J438 Other emphysema: Secondary | ICD-10-CM

## 2016-03-23 DIAGNOSIS — I6200 Nontraumatic subdural hemorrhage, unspecified: Secondary | ICD-10-CM | POA: Diagnosis not present

## 2016-03-23 DIAGNOSIS — E118 Type 2 diabetes mellitus with unspecified complications: Secondary | ICD-10-CM

## 2016-03-23 DIAGNOSIS — S065X9A Traumatic subdural hemorrhage with loss of consciousness of unspecified duration, initial encounter: Secondary | ICD-10-CM | POA: Insufficient documentation

## 2016-03-23 NOTE — Progress Notes (Signed)
Cardiology Office Note   Date:  03/23/2016   ID:  BRITTANT PULLER, DOB 1933/01/21, MRN [id number]  PCP:  Irven Shelling, MD  Cardiologist:  Sinclair Grooms, MD   Chief Complaint  Patient presents with  . Irregular Heart Beat      History of Present Illness: Carolyn [name] is a 82 y.o. female who presents for Cardiac evaluation related to her recent hospital stay for subdural hematoma.  The daughter who is a nurse is concerned she had heart rates into the 46s while suffering with this problem. She was also seen by the hospitalist service who added low-dose beta blocker in the form of metoprolol tartrate 25 mg twice a day. She also has telephone pictures of the cardiac monitor which shows an occasional PVC. The patient has no cardiopulmonary complaints. She is referred because of "PVCs" according to Griffin's office notes.    Past Medical History  Diagnosis Date  . Diabetes mellitus   . Hypertension   . Hypercholesterolemia   . Gallstones   . Glaucoma   . Cataracts, bilateral   . COPD (chronic obstructive pulmonary disease) ([place])   . Hypothyroid   . PONV (postoperative nausea and vomiting)   . Asthma   . Chronic kidney disease 2007    kidney cancer and partial nephrctomy-right  . Subdural bleeding [facility])     Past Surgical History  Procedure Laterality Date  . Dilation and curettage of uterus    . Partial nephrectomy      cancer  . Breast biopsy    . D&c for enlarged uterine  210  . Total abdominal hysterectomy w/ bilateral salpingoophorectomy  2011  . Cateract extractions and iol    . Cholecystectomy  2008  . Abdominal hysterectomy    . Eye surgery  08/2011,09/2012    bilateral cataracts  . Hernia repair  01/19/12    inguinal  . Inguinal hernia repair  01/19/2012    Procedure: HERNIA REPAIR INGUINAL ADULT;  Surgeon: Earnstine Regal, MD;  Location: [facility];  Service: General;  Laterality: Left;  repair left inguinal hernia with mesh   . Craniotomy Left 02/06/2016      Procedure: CRANIOTOMY HEMATOMA EVACUATION SUBDURAL;  Surgeon: Kevan Ny Ditty, MD;  Location: [facility];  Service: Neurosurgery;  Laterality: Left;  . Craniotomy N/A 02/17/2016    Procedure: CRANIOTOMY HEMATOMA EVACUATION SUBDURAL;  Surgeon: Kevan Ny Ditty, MD;  Location: [place] NEURO ORS;  Service: Neurosurgery;  Laterality: N/A;  left  . Craniotomy Left 02/18/2016    Procedure: CRANIOTOMY HEMATOMA EVACUATION SUBDURAL;  Surgeon: Earnie Larsson, MD;  Location: [place] NEURO ORS;  Service: Neurosurgery;  Laterality: Left;     Current Outpatient Prescriptions  Medication Sig Dispense Refill  . Fluticasone-Salmeterol (ADVAIR) 250-50 MCG/DOSE AEPB Inhale 1 puff into the lungs 2 (two) times daily as needed (for wheezing/shortness of breath).     [name] glimepiride (AMARYL) 2 MG tablet Take 2 tablets (4 mg total) by mouth at bedtime. 30 tablet 1  . latanoprost (XALATAN) 0.005 % ophthalmic solution Place 1 drop into both eyes at bedtime.     . levETIRAcetam (KEPPRA) 250 MG tablet Take 5 tablets (1,250 mg total) by mouth 2 (two) times daily. 60 tablet 1  . lisinopril (PRINIVIL,ZESTRIL) 20 MG tablet Take 1 tablet (20 mg total) by mouth at bedtime. 30 tablet 1  . metoprolol tartrate (LOPRESSOR) 25 MG tablet Take 1 tablet (25 mg total) by mouth 2 (two) times daily. 60 tablet  1  . pantoprazole (PROTONIX) 40 MG tablet Take 1 tablet (40 mg total) by mouth daily. 30 tablet 1  . pirbuterol (MAXAIR) 200 MCG/INH inhaler Inhale 2 puffs into the lungs 4 (four) times daily as needed for wheezing or shortness of breath.     . polyethylene glycol (MIRALAX / GLYCOLAX) packet Take 17 g by mouth daily as needed for mild constipation.     . theophylline (UNIPHYL) 400 MG 24 hr tablet Take 400 mg by mouth at bedtime.     [name] thyroid (ARMOUR) 60 MG tablet Take 60 mg by mouth at bedtime.     . verapamil (CALAN-SR) 180 MG CR tablet Take 180 mg by mouth at bedtime.     No current facility-administered medications for this visit.     Allergies:   Darvocet; Metformin and related; Percocet; Topamax; Tramadol; and Vicodin    Social History:  The patient  reports that she quit smoking about 34 years ago. Her smoking use included Cigarettes. She has a 5 pack-year smoking history. She has never used smokeless tobacco. She reports that she does not drink alcohol or use illicit drugs.   Family History:  The patient's family history includes Breast cancer in her paternal grandmother; Diabetes in her paternal grandmother; Heart attack in her paternal grandfather; Hypertension in her father, mother, and paternal grandmother; Stroke in her paternal grandfather and paternal grandmother.    ROS:  Please see the history of present illness.   Otherwise, review of systems are positive for speech impairment related to recent neurological condition.   All other systems are reviewed and negative.    PHYSICAL EXAM: VS:  BP 100/54 mmHg  Pulse 63  Ht 5\' 6"  (1.676 m)  Wt 216 lb (97.977 kg)  BMI 34.88 kg/m2  LMP 03/24/2010 , BMI Body mass index is 34.88 kg/(m^2). GEN: Well nourished, well developed, in no acute distress HEENT: normal Neck: no JVD, carotid bruits, or masses Cardiac: RRR.  There is no murmur, rub, or gallop. There is no edema. Respiratory:  clear to auscultation bilaterally, normal work of breathing. GI: soft, nontender, nondistended, + BS MS: no deformity or atrophy Skin: warm and dry, no rash Neuro:  Strength and sensation are intact Psych: euthymic mood, full affect   EKG:  EKG is not ordered today. The ekg reveals 03/15/2016 demonstrated right bundle branch block, left axis deviation, and sinus rhythm at a rate of 70 bpm  Echocardiogram, 02/21/2016: Study Conclusions  - Left ventricle: The cavity size was normal. Wall thickness was  normal. Systolic function was normal. The estimated ejection  fraction was in the range of 55% to 60%. Wall motion was normal;  there were no regional wall motion  abnormalities. Doppler  parameters are consistent with abnormal left ventricular  relaxation (grade 1 diastolic dysfunction). Doppler parameters  are consistent with high ventricular filling pressure. - Aortic valve: There was trivial regurgitation. - Mitral valve: There was mild regurgitation. - Left atrium: The atrium was mildly dilated. - Pulmonary arteries: Systolic pressure was mildly increased. PA  peak pressure: 48 mm Hg (S).   Recent Labs: 01/21/2016: TSH 0.285* 02/21/2016: Magnesium 2.4 03/13/2016: ALT 13*; BUN 9; Creatinine, Ser 0.60; Hemoglobin 13.9; Platelets 205; Potassium 3.7; Sodium 140    Lipid Panel    Component Value Date/Time   CHOL 155 01/21/2016 0511   TRIG 50 01/21/2016 0511   HDL 59 01/21/2016 0511   CHOLHDL 2.6 01/21/2016 0511   VLDL 10 01/21/2016 0511   LDLCALC 86  01/21/2016 0511      Wt Readings from Last 3 Encounters:  03/23/16 216 lb (97.977 kg)  03/06/16 182 lb 1.6 oz (82.6 kg)  02/26/16 200 lb 6.4 oz (90.9 kg)      Other studies Reviewed: Additional studies/ records that were reviewed today include: Reviewed extensive records from March and April hospitalizations for subdural hematoma.. The findings include there were no specific cardiac issues and I can tell by [place] of the discharge summaries and multiple rhythm strips. Also reviewed EKGs and the echocardiogram from the hospital stay..    ASSESSMENT AND PLAN:  1. Bradycardia This problem is resolved. Presumed benign.  2. Premature ventricular contractions Uniform PVCs were noted while hospitalized for subdural hematoma. Heart is structurally normal on echocardiography during the hospital stay. Presumed benign.  Current medicines are reviewed at length with the patient today.  The patient has the following concerns regarding medicines: Daughter is concerned about the addition of metoprolol..  The following changes/actions have been instituted:    I'm not sure metoprolol is absolutely  indicated or needed. This medication can be discontinued by decreasing to 12.5 mg twice a day for 2 weeks and then discontinuing thereafter. I have not recommended this until after she follows up with the neurosurgeon. Perhaps this medication was started to help keep her blood pressure low in the setting of intracranial bleeding.  No specific cardiac workup is needed.  Labs/ tests ordered today include:  No orders of the defined types were placed in this encounter.     Disposition:   FU with HS in as needed   Signed, Sinclair Grooms, MD  03/23/2016 3:14 PM    [facility] [address], [place], [place]  [address] Phone: [phone]; Fax: [phone]

## 2016-03-23 NOTE — Patient Instructions (Signed)
Medication Instructions:  Your physician recommends that you continue on your current medications as directed. Please refer to the Current Medication list given to you today.  Labwork: None ordered.  Testing/Procedures: None ordered.  Follow-Up: Your physician recommends that you schedule a follow-up appointment as needed.   Any Other Special Instructions Will Be Listed Below (If Applicable).     If you need a refill on your cardiac medications before your next appointment, please call your pharmacy.   

## 2016-03-24 DIAGNOSIS — H409 Unspecified glaucoma: Secondary | ICD-10-CM | POA: Diagnosis not present

## 2016-03-24 DIAGNOSIS — Z9181 History of falling: Secondary | ICD-10-CM | POA: Diagnosis not present

## 2016-03-24 DIAGNOSIS — I10 Essential (primary) hypertension: Secondary | ICD-10-CM | POA: Diagnosis not present

## 2016-03-24 DIAGNOSIS — J449 Chronic obstructive pulmonary disease, unspecified: Secondary | ICD-10-CM | POA: Diagnosis not present

## 2016-03-24 DIAGNOSIS — W1839XD Other fall on same level, subsequent encounter: Secondary | ICD-10-CM | POA: Diagnosis not present

## 2016-03-24 DIAGNOSIS — Z87891 Personal history of nicotine dependence: Secondary | ICD-10-CM | POA: Diagnosis not present

## 2016-03-24 DIAGNOSIS — S065X0S Traumatic subdural hemorrhage without loss of consciousness, sequela: Secondary | ICD-10-CM | POA: Diagnosis not present

## 2016-03-24 DIAGNOSIS — R4701 Aphasia: Secondary | ICD-10-CM | POA: Diagnosis not present

## 2016-03-24 DIAGNOSIS — E039 Hypothyroidism, unspecified: Secondary | ICD-10-CM | POA: Diagnosis not present

## 2016-03-24 DIAGNOSIS — E119 Type 2 diabetes mellitus without complications: Secondary | ICD-10-CM | POA: Diagnosis not present

## 2016-03-24 DIAGNOSIS — G8191 Hemiplegia, unspecified affecting right dominant side: Secondary | ICD-10-CM | POA: Diagnosis not present

## 2016-03-24 DIAGNOSIS — S52122D Displaced fracture of head of left radius, subsequent encounter for closed fracture with routine healing: Secondary | ICD-10-CM | POA: Diagnosis not present

## 2016-03-24 DIAGNOSIS — G40909 Epilepsy, unspecified, not intractable, without status epilepticus: Secondary | ICD-10-CM | POA: Diagnosis not present

## 2016-03-25 DIAGNOSIS — E039 Hypothyroidism, unspecified: Secondary | ICD-10-CM | POA: Diagnosis not present

## 2016-03-25 DIAGNOSIS — R4701 Aphasia: Secondary | ICD-10-CM | POA: Diagnosis not present

## 2016-03-25 DIAGNOSIS — S52122D Displaced fracture of head of left radius, subsequent encounter for closed fracture with routine healing: Secondary | ICD-10-CM | POA: Diagnosis not present

## 2016-03-25 DIAGNOSIS — I10 Essential (primary) hypertension: Secondary | ICD-10-CM | POA: Diagnosis not present

## 2016-03-25 DIAGNOSIS — H409 Unspecified glaucoma: Secondary | ICD-10-CM | POA: Diagnosis not present

## 2016-03-25 DIAGNOSIS — G40909 Epilepsy, unspecified, not intractable, without status epilepticus: Secondary | ICD-10-CM | POA: Diagnosis not present

## 2016-03-25 DIAGNOSIS — J449 Chronic obstructive pulmonary disease, unspecified: Secondary | ICD-10-CM | POA: Diagnosis not present

## 2016-03-25 DIAGNOSIS — Z87891 Personal history of nicotine dependence: Secondary | ICD-10-CM | POA: Diagnosis not present

## 2016-03-25 DIAGNOSIS — E119 Type 2 diabetes mellitus without complications: Secondary | ICD-10-CM | POA: Diagnosis not present

## 2016-03-25 DIAGNOSIS — S065X0S Traumatic subdural hemorrhage without loss of consciousness, sequela: Secondary | ICD-10-CM | POA: Diagnosis not present

## 2016-03-25 DIAGNOSIS — G8191 Hemiplegia, unspecified affecting right dominant side: Secondary | ICD-10-CM | POA: Diagnosis not present

## 2016-03-25 DIAGNOSIS — Z9181 History of falling: Secondary | ICD-10-CM | POA: Diagnosis not present

## 2016-03-25 DIAGNOSIS — W1839XD Other fall on same level, subsequent encounter: Secondary | ICD-10-CM | POA: Diagnosis not present

## 2016-03-30 DIAGNOSIS — E119 Type 2 diabetes mellitus without complications: Secondary | ICD-10-CM | POA: Diagnosis not present

## 2016-03-30 DIAGNOSIS — Z9841 Cataract extraction status, right eye: Secondary | ICD-10-CM | POA: Diagnosis not present

## 2016-03-30 DIAGNOSIS — H26493 Other secondary cataract, bilateral: Secondary | ICD-10-CM | POA: Diagnosis not present

## 2016-03-30 DIAGNOSIS — Z9842 Cataract extraction status, left eye: Secondary | ICD-10-CM | POA: Diagnosis not present

## 2016-04-01 DIAGNOSIS — Z87891 Personal history of nicotine dependence: Secondary | ICD-10-CM | POA: Diagnosis not present

## 2016-04-01 DIAGNOSIS — W1839XD Other fall on same level, subsequent encounter: Secondary | ICD-10-CM | POA: Diagnosis not present

## 2016-04-01 DIAGNOSIS — R4701 Aphasia: Secondary | ICD-10-CM | POA: Diagnosis not present

## 2016-04-01 DIAGNOSIS — Z9181 History of falling: Secondary | ICD-10-CM | POA: Diagnosis not present

## 2016-04-01 DIAGNOSIS — H409 Unspecified glaucoma: Secondary | ICD-10-CM | POA: Diagnosis not present

## 2016-04-01 DIAGNOSIS — G40909 Epilepsy, unspecified, not intractable, without status epilepticus: Secondary | ICD-10-CM | POA: Diagnosis not present

## 2016-04-01 DIAGNOSIS — I10 Essential (primary) hypertension: Secondary | ICD-10-CM | POA: Diagnosis not present

## 2016-04-01 DIAGNOSIS — S52122D Displaced fracture of head of left radius, subsequent encounter for closed fracture with routine healing: Secondary | ICD-10-CM | POA: Diagnosis not present

## 2016-04-01 DIAGNOSIS — S065X0S Traumatic subdural hemorrhage without loss of consciousness, sequela: Secondary | ICD-10-CM | POA: Diagnosis not present

## 2016-04-01 DIAGNOSIS — G8191 Hemiplegia, unspecified affecting right dominant side: Secondary | ICD-10-CM | POA: Diagnosis not present

## 2016-04-01 DIAGNOSIS — E039 Hypothyroidism, unspecified: Secondary | ICD-10-CM | POA: Diagnosis not present

## 2016-04-01 DIAGNOSIS — E119 Type 2 diabetes mellitus without complications: Secondary | ICD-10-CM | POA: Diagnosis not present

## 2016-04-01 DIAGNOSIS — J449 Chronic obstructive pulmonary disease, unspecified: Secondary | ICD-10-CM | POA: Diagnosis not present

## 2016-04-04 ENCOUNTER — Other Ambulatory Visit: Payer: Self-pay | Admitting: Physical Medicine & Rehabilitation

## 2016-04-06 DIAGNOSIS — Z87891 Personal history of nicotine dependence: Secondary | ICD-10-CM | POA: Diagnosis not present

## 2016-04-06 DIAGNOSIS — R4701 Aphasia: Secondary | ICD-10-CM | POA: Diagnosis not present

## 2016-04-06 DIAGNOSIS — J449 Chronic obstructive pulmonary disease, unspecified: Secondary | ICD-10-CM | POA: Diagnosis not present

## 2016-04-06 DIAGNOSIS — G40909 Epilepsy, unspecified, not intractable, without status epilepticus: Secondary | ICD-10-CM | POA: Diagnosis not present

## 2016-04-06 DIAGNOSIS — H409 Unspecified glaucoma: Secondary | ICD-10-CM | POA: Diagnosis not present

## 2016-04-06 DIAGNOSIS — E119 Type 2 diabetes mellitus without complications: Secondary | ICD-10-CM | POA: Diagnosis not present

## 2016-04-06 DIAGNOSIS — E039 Hypothyroidism, unspecified: Secondary | ICD-10-CM | POA: Diagnosis not present

## 2016-04-06 DIAGNOSIS — Z9181 History of falling: Secondary | ICD-10-CM | POA: Diagnosis not present

## 2016-04-06 DIAGNOSIS — S52122D Displaced fracture of head of left radius, subsequent encounter for closed fracture with routine healing: Secondary | ICD-10-CM | POA: Diagnosis not present

## 2016-04-06 DIAGNOSIS — S065X0S Traumatic subdural hemorrhage without loss of consciousness, sequela: Secondary | ICD-10-CM | POA: Diagnosis not present

## 2016-04-06 DIAGNOSIS — G8191 Hemiplegia, unspecified affecting right dominant side: Secondary | ICD-10-CM | POA: Diagnosis not present

## 2016-04-06 DIAGNOSIS — W1839XD Other fall on same level, subsequent encounter: Secondary | ICD-10-CM | POA: Diagnosis not present

## 2016-04-06 DIAGNOSIS — I10 Essential (primary) hypertension: Secondary | ICD-10-CM | POA: Diagnosis not present

## 2016-04-15 ENCOUNTER — Emergency Department (HOSPITAL_COMMUNITY)
Admission: EM | Admit: 2016-04-15 | Discharge: 2016-04-15 | Disposition: A | Discharge status: Home / Self Care | Payer: PPO | Attending: Emergency Medicine | Admitting: Emergency Medicine

## 2016-04-15 ENCOUNTER — Encounter (HOSPITAL_COMMUNITY): Payer: Self-pay | Admitting: Emergency Medicine

## 2016-04-15 ENCOUNTER — Emergency Department (HOSPITAL_COMMUNITY): Payer: PPO

## 2016-04-15 DIAGNOSIS — Z87891 Personal history of nicotine dependence: Secondary | ICD-10-CM | POA: Diagnosis not present

## 2016-04-15 DIAGNOSIS — I639 Cerebral infarction, unspecified: Secondary | ICD-10-CM | POA: Diagnosis not present

## 2016-04-15 DIAGNOSIS — R4701 Aphasia: Secondary | ICD-10-CM

## 2016-04-15 DIAGNOSIS — E039 Hypothyroidism, unspecified: Secondary | ICD-10-CM | POA: Diagnosis not present

## 2016-04-15 DIAGNOSIS — Z79899 Other long term (current) drug therapy: Secondary | ICD-10-CM | POA: Diagnosis not present

## 2016-04-15 DIAGNOSIS — F801 Expressive language disorder: Secondary | ICD-10-CM | POA: Diagnosis not present

## 2016-04-15 DIAGNOSIS — E7800 Pure hypercholesterolemia, unspecified: Secondary | ICD-10-CM | POA: Diagnosis not present

## 2016-04-15 DIAGNOSIS — Z7984 Long term (current) use of oral hypoglycemic drugs: Secondary | ICD-10-CM | POA: Insufficient documentation

## 2016-04-15 DIAGNOSIS — I129 Hypertensive chronic kidney disease with stage 1 through stage 4 chronic kidney disease, or unspecified chronic kidney disease: Secondary | ICD-10-CM | POA: Diagnosis not present

## 2016-04-15 DIAGNOSIS — R251 Tremor, unspecified: Secondary | ICD-10-CM | POA: Insufficient documentation

## 2016-04-15 DIAGNOSIS — J449 Chronic obstructive pulmonary disease, unspecified: Secondary | ICD-10-CM | POA: Diagnosis not present

## 2016-04-15 DIAGNOSIS — N189 Chronic kidney disease, unspecified: Secondary | ICD-10-CM | POA: Diagnosis not present

## 2016-04-15 DIAGNOSIS — E1122 Type 2 diabetes mellitus with diabetic chronic kidney disease: Secondary | ICD-10-CM | POA: Insufficient documentation

## 2016-04-15 DIAGNOSIS — M170 Bilateral primary osteoarthritis of knee: Secondary | ICD-10-CM | POA: Diagnosis not present

## 2016-04-15 LAB — URINALYSIS, ROUTINE W REFLEX MICROSCOPIC
Bilirubin Urine: NEGATIVE
Glucose, UA: NEGATIVE mg/dL
Hgb urine dipstick: NEGATIVE
Ketones, ur: NEGATIVE mg/dL
Leukocytes, UA: NEGATIVE
Nitrite: NEGATIVE
Protein, ur: NEGATIVE mg/dL
Specific Gravity, Urine: 1.02 (ref 1.005–1.030)
pH: 6.5 (ref 5.0–8.0)

## 2016-04-15 LAB — I-STAT CHEM 8, ED
BUN: 13 mg/dL (ref 6–20)
Calcium, Ion: 1.31 mmol/L — ABNORMAL HIGH (ref 1.13–1.30)
Chloride: 102 mmol/L (ref 101–111)
Creatinine, Ser: 0.7 mg/dL (ref 0.44–1.00)
Glucose, Bld: 144 mg/dL — ABNORMAL HIGH (ref 65–99)
HCT: 41 % (ref 36.0–46.0)
Hemoglobin: 13.9 g/dL (ref 12.0–15.0)
Potassium: 3.7 mmol/L (ref 3.5–5.1)
Sodium: 143 mmol/L (ref 135–145)
TCO2: 29 mmol/L (ref 0–100)

## 2016-04-15 LAB — I-STAT TROPONIN, ED: Troponin i, poc: 0 ng/mL (ref 0.00–0.08)

## 2016-04-15 LAB — COMPREHENSIVE METABOLIC PANEL
ALT: 9 U/L — ABNORMAL LOW (ref 14–54)
AST: 13 U/L — ABNORMAL LOW (ref 15–41)
Albumin: 3.6 g/dL (ref 3.5–5.0)
Alkaline Phosphatase: 72 U/L (ref 38–126)
Anion gap: 5 (ref 5–15)
BUN: 10 mg/dL (ref 6–20)
CO2: 30 mmol/L (ref 22–32)
Calcium: 10.3 mg/dL (ref 8.9–10.3)
Chloride: 105 mmol/L (ref 101–111)
Creatinine, Ser: 0.76 mg/dL (ref 0.44–1.00)
GFR calc Af Amer: >60 mL/min (ref >60)
GFR calc non Af Amer: >60 mL/min (ref >60)
Glucose, Bld: 147 mg/dL — ABNORMAL HIGH (ref 65–99)
Potassium: 3.7 mmol/L (ref 3.5–5.1)
Sodium: 140 mmol/L (ref 135–145)
Total Bilirubin: 0.7 mg/dL (ref 0.3–1.2)
Total Protein: 6.9 g/dL (ref 6.5–8.1)

## 2016-04-15 LAB — DIFFERENTIAL
Basophils Absolute: 0 10*3/uL (ref 0.0–0.1)
Basophils Relative: 1 %
Eosinophils Absolute: 1.1 10*3/uL — ABNORMAL HIGH (ref 0.0–0.7)
Eosinophils Relative: 12 %
Lymphocytes Relative: 35 %
Lymphs Abs: 3 10*3/uL (ref 0.7–4.0)
Monocytes Absolute: 0.5 10*3/uL (ref 0.1–1.0)
Monocytes Relative: 5 %
Neutro Abs: 4 10*3/uL (ref 1.7–7.7)
Neutrophils Relative %: 47 %

## 2016-04-15 LAB — CBC
HCT: 41 % (ref 36.0–46.0)
Hemoglobin: 13.2 g/dL (ref 12.0–15.0)
MCH: 28.5 pg (ref 26.0–34.0)
MCHC: 32.2 g/dL (ref 30.0–36.0)
MCV: 88.6 fL (ref 78.0–100.0)
Platelets: 197 10*3/uL (ref 150–400)
RBC: 4.63 MIL/uL (ref 3.87–5.11)
RDW: 14 % (ref 11.5–15.5)
WBC: 8.6 10*3/uL (ref 4.0–10.5)

## 2016-04-15 LAB — PROTIME-INR
INR: 1.03 (ref 0.00–1.49)
Prothrombin Time: 13.7 seconds (ref 11.6–15.2)

## 2016-04-15 LAB — APTT: aPTT: 33 seconds (ref 24–37)

## 2016-04-15 LAB — CBG MONITORING, ED: Glucose-Capillary: 119 mg/dL — ABNORMAL HIGH (ref 65–99)

## 2016-04-15 MED ORDER — LEVETIRACETAM 500 MG PO TABS: ORAL_TABLET | ORAL | Status: DC

## 2016-04-15 NOTE — ED Notes (Signed)
Pt states she understands instructions. Home via w/c with daughter.

## 2016-04-15 NOTE — ED Notes (Signed)
Pt here for increased slurred speech; pt had hx of recent subdural; pt family noted change in speech this weekend; pt with increased tremor in right arm

## 2016-04-15 NOTE — ED Notes (Signed)
Pt to room 42 from CT. Pt awake and alert with difficulty speaking.

## 2016-04-15 NOTE — ED Provider Notes (Signed)
CSN: [id number]     Arrival date & time 04/15/16  1234 History   First MD Initiated Contact with Patient 04/15/16 1322     Chief Complaint  Patient presents with  . Aphasia     (Consider location/radiation/quality/duration/timing/severity/associated sxs/prior Treatment) HPI Comments: 82yo F w/ PMH including recent SDH requiring craniotomy, T2DM, COPD, CKD who p/w aphasia. History obtained with the assistance of the patient's daughter. Daughter reports that the patient has had some expressive aphasia and right arm weakness since discharge from her rehabilitation facility as a result of her previous head bleed. She has been doing well at home. Daughter states that she noticed over the weekend that the patient was having an increase in her speech difficulty as well as increased tremor in her right arm. The patient herself has noticed more difficulty getting words out than previously. She denies any headache, visual changes, new extremity weakness, extremity numbness, fevers, or recent illness. She has been compliant with all medications. No falls or recent head injury.  The history is provided by the patient and a relative.    Past Medical History  Diagnosis Date  . Diabetes mellitus   . Hypertension   . Hypercholesterolemia   . Gallstones   . Glaucoma   . Cataracts, bilateral   . COPD (chronic obstructive pulmonary disease) ([place])   . Hypothyroid   . PONV (postoperative nausea and vomiting)   . Asthma   . Chronic kidney disease 2007    kidney cancer and partial nephrctomy-right  . Subdural bleeding [facility])    Past Surgical History  Procedure Laterality Date  . Dilation and curettage of uterus    . Partial nephrectomy      cancer  . Breast biopsy    . D&c for enlarged uterine  210  . Total abdominal hysterectomy w/ bilateral salpingoophorectomy  2011  . Cateract extractions and iol    . Cholecystectomy  2008  . Abdominal hysterectomy    . Eye surgery  08/2011,09/2012    bilateral  cataracts  . Hernia repair  01/19/12    inguinal  . Inguinal hernia repair  01/19/2012    Procedure: HERNIA REPAIR INGUINAL ADULT;  Surgeon: Earnstine Regal, MD;  Location: [facility];  Service: General;  Laterality: Left;  repair left inguinal hernia with mesh   . Craniotomy Left 02/06/2016    Procedure: CRANIOTOMY HEMATOMA EVACUATION SUBDURAL;  Surgeon: Kevan Ny Ditty, MD;  Location: [facility]UR[facility];  Service: Neurosurgery;  Laterality: Left;  . Craniotomy N/A 02/17/2016    Procedure: CRANIOTOMY HEMATOMA EVACUATION SUBDURAL;  Surgeon: Kevan Ny Ditty, MD;  Location: [place] NEURO ORS;  Service: Neurosurgery;  Laterality: N/A;  left  . Craniotomy Left 02/18/2016    Procedure: CRANIOTOMY HEMATOMA EVACUATION SUBDURAL;  Surgeon: Earnie Larsson, MD;  Location: [place] NEURO ORS;  Service: Neurosurgery;  Laterality: Left;   Family History  Problem Relation Age of Onset  . Hypertension Mother   . Hypertension Father   . Breast cancer Paternal Grandmother   . Diabetes Paternal Grandmother   . Hypertension Paternal Grandmother   . Stroke Paternal Grandmother   . Stroke Paternal Grandfather   . Heart attack Paternal Grandfather    Social History  Substance Use Topics  . Smoking status: Former Smoker -- 1.00 packs/day for 5 years    Types: Cigarettes    Quit date: 01/11/1982  . Smokeless tobacco: Never Used     Comment: quit maybe 68yrs  . Alcohol Use: No   OB History  No data available     Review of Systems 10 Systems reviewed and are negative for acute change except as noted in the HPI.    Allergies  Darvocet; Metformin and related; Percocet; Prednisone; Topamax; Tramadol; and Vicodin  Home Medications   Prior to Admission medications   Medication Sig Start Date End Date Taking? Authorizing Provider  glimepiride (AMARYL) 2 MG tablet Take 2 tablets (4 mg total) by mouth at bedtime. 03/06/16  Yes Daniel J Angiulli, PA-C  latanoprost (XALATAN) 0.005 % ophthalmic solution Place 1 drop into both  eyes at bedtime.    Yes Historical Provider, MD  lisinopril (PRINIVIL,ZESTRIL) 20 MG tablet Take 1 tablet (20 mg total) by mouth at bedtime. 03/06/16  Yes Daniel J Angiulli, PA-C  metoprolol tartrate (LOPRESSOR) 25 MG tablet Take 1 tablet (25 mg total) by mouth 2 (two) times daily. 03/06/16  Yes Daniel J Angiulli, PA-C  pantoprazole (PROTONIX) 40 MG tablet Take 1 tablet (40 mg total) by mouth daily. 03/06/16  Yes Daniel J Angiulli, PA-C  pirbuterol (MAXAIR) 200 MCG/INH inhaler Inhale 2 puffs into the lungs 4 (four) times daily as needed for wheezing or shortness of breath.    Yes Historical Provider, MD  polyethylene glycol (MIRALAX / GLYCOLAX) packet Take 17 g by mouth daily as needed for mild constipation.    Yes Historical Provider, MD  theophylline (UNIPHYL) 400 MG 24 hr tablet Take 400 mg by mouth at bedtime.    Yes Historical Provider, MD  thyroid (ARMOUR) 60 MG tablet Take 60 mg by mouth at bedtime.    Yes Historical Provider, MD  verapamil (CALAN-SR) 180 MG CR tablet Take 180 mg by mouth at bedtime.   Yes Historical Provider, MD  Fluticasone-Salmeterol (ADVAIR) 250-50 MCG/DOSE AEPB Inhale 1 puff into the lungs 2 (two) times daily as needed (for wheezing/shortness of breath). Reported on 04/15/2016    Historical Provider, MD  levETIRAcetam (KEPPRA) 500 MG tablet Take 1750mg  (3.5 tablets) by mouth twice daily 04/15/16   Wenda Overland Abdou Stocks, MD   BP 153/89 mmHg  Pulse 58  Temp(Src) 97.9 F (36.6 C) (Oral)  Resp 14  SpO2 99%  LMP 03/24/2010 Physical Exam  Constitutional: She is oriented to person, place, and time. She appears well-developed and well-nourished. No distress.  HENT:  Head: Normocephalic and atraumatic.  Moist mucous membranes  Eyes: Conjunctivae and EOM are normal. Pupils are equal, round, and reactive to light.  Neck: Neck supple.  Cardiovascular: Normal rate, regular rhythm and normal heart sounds.   No murmur heard. Pulmonary/Chest: Effort normal and breath sounds normal.   Abdominal: Soft. Bowel sounds are normal. She exhibits no distension. There is no tenderness.  Musculoskeletal: She exhibits no edema.  Neurological: She is alert and oriented to person, place, and time. She has normal reflexes. No cranial nerve deficit. She exhibits normal muscle tone.  Expressive aphasia but normal comprehension, slowed speech; mild dysmetria on R finger-to-nose testing, no clonus; negative pronator drift, 4+/5 strength RUE, 5/5 strength LUE,BLE; normal sensation throughout  Skin: Skin is warm and dry. No rash noted.  Psychiatric: She has a normal mood and affect. Judgment normal.  Nursing note and vitals reviewed.   ED Course  Procedures (including critical care time) Labs Review Labs Reviewed  DIFFERENTIAL - Abnormal; Notable for the following:    Eosinophils Absolute 1.1 (*)    All other components within normal limits  COMPREHENSIVE METABOLIC PANEL - Abnormal; Notable for the following:    Glucose, Bld 147 (*)  AST 13 (*)    ALT 9 (*)    All other components within normal limits  CBG MONITORING, ED - Abnormal; Notable for the following:    Glucose-Capillary 119 (*)    All other components within normal limits  I-STAT CHEM 8, ED - Abnormal; Notable for the following:    Glucose, Bld 144 (*)    Calcium, Ion 1.31 (*)    All other components within normal limits  PROTIME-INR  APTT  CBC  URINALYSIS, ROUTINE W REFLEX MICROSCOPIC (NOT AT [facility])  I-STAT TROPOININ, ED    Imaging Review Ct Head Wo Contrast  04/15/2016  CLINICAL DATA:  Generalized weakness with slurred speech. Recent history of subdural hematoma. EXAM: CT HEAD WITHOUT CONTRAST TECHNIQUE: Contiguous axial images were obtained from the base of the skull through the vertex without intravenous contrast. COMPARISON:  03/05/2016 and 01/20/2016 FINDINGS: Examination demonstrates evidence of patient's previous left frontoparietal craniotomy. Persistent tiny left convexity subdural hematoma measuring 4 mm  in thickness without significant change. No significant mass effect or midline shift. Stable small old left posterior frontal infarct minimal chronic ischemic microvascular disease. Ventricles and cisterns are otherwise within normal. Remainder of the exam is unchanged to include moderate chronic sinus inflammatory disease predominately involving the frontal and ethmoid sinuses. IMPRESSION: Stable tiny left subdural hematoma measuring 4 mm in thickness. No significant mass effect or midline shift. Previous left frontoparietal craniotomy. Stable small left posterior frontal infarct. Mild chronic ischemic microvascular disease. Stable chronic sinus inflammatory disease. Electronically Signed   By: Marin Olp M.D.   On: 04/15/2016 13:18   I have personally reviewed and evaluated these lab results as part of my medical decision-making.   EKG Interpretation   Date/Time:  Wednesday Apr 15 2016 13:28:41 EDT Ventricular Rate:  59 PR Interval:  140 QRS Duration: 149 QT Interval:  486 QTC Calculation: 481 R Axis:   -31 Text Interpretation:  Sinus rhythm Atrial premature complex Right bundle  branch block Left ventricular hypertrophy no acute ischemic changes  Confirmed by Addison Whidbee MD, Aarik Blank [id number]) on 04/15/2016 1:38:48 PM      MDM   Final diagnoses:  Expressive aphasia  Tremor of right hand   Pt w/ h/o SDH requiring craniotomy earlier this year, currently on keppra, p/w worsening expressive aphasia and R arm tremor that daughter noticed over the weekend.She was awake and alert, in no acute distress with reassuring vital signs. She had mild dysmetria on finger to nose testing on right, very mild weakness of right upper extremity but otherwise normal neurologic exam. EKG without ischemic changes. CT shows stable tiny left subdural in the area of the patient's recent craniotomy. No other acute findings. I discussed symptoms with the patient's primary neurosurgeon, Dr. Cyndy Freeze, who reviewed films and  recommended increasing Keppra to 1750mg  BID and follow up tomorrow in his clinic.   All labwork reassuring, UA normal.   I discussed plan with the patient and her daughter and they voiced understanding. Return precautions reviewed and patient discharged in satisfactory condition.    Sharlett Iles, MD 04/15/16 [phone]

## 2016-04-15 NOTE — Discharge Instructions (Signed)
Aphasia Aphasia is damage to the part of your brain that you need to communicate. For most people, that area is on the left side of the brain. Aphasia does not affect your intelligence, but you may struggle to talk, understand speech, read, or write. Aphasia can happen to anyone at any age, but it is most common in older age. CAUSES  An interruption of blood supply to the brain (stroke) is the most common cause of aphasia. Any disease or disorder that damages the communication areas of the brain can cause aphasia. This includes:   Brain tumors.  Brain injuries.  Brain infections.  Progressive diseases of the nervous system (neurological disorders). RISK FACTORS You may be at risk for aphasia if you have had any trauma, disease, or disorder that damaged the communication areas of the brain. SIGNS AND SYMPTOMS  Aphasia may start suddenly if it is caused by a stroke or brain injury. Aphasia caused by a tumor or a progressive neurological disorder may start gradually. The condition affects people differently. Signs and symptoms of aphasia include:  Trouble finding the right word.  Using the wrong words.  Talking in sentences that do not make sense.  Making up words.  Being unable to understand other people's speech.  Having problems writing, spelling, or reading.  Having trouble with numbers.  Having trouble swallowing. DIAGNOSIS  Your health care provider may suspect you have aphasia if you lose the ability to speak or understand language. You may need to see a specialist (speech and language pathologist) to help determine the diagnosis of aphasia. This person may do a series of tests to check your ability to:  Speak.  Express ideas.  Make conversation.  Understand speech.  Read and write. TREATMENT  In some cases, aphasia may improve on its own over time. Treatment for aphasia usually involves therapy with a pathologist. Your treatment will be designed to meet your needs  and abilities. Common treatments include:  Speech therapy.  Learning other ways to communicate.  Working with family members to find the best ways to communicate.  Working with an occupational therapist to find ways to communicate at work. HOME CARE INSTRUCTIONS  Keep all follow-up appointments.  Make sure you have a good support system at home.  The following techniques may be helpful while communicating:  Use short, simple sentences. Ask family members to do the same. Sentences that require one-word answers are easiest.  Avoid distractions like background noise when trying to listen or talk.  Try communicating with gestures, pointing, or drawing.  Talk slowly. Ask family members to talk to you slowly.  Maintain eye contact when communicating. SEEK MEDICAL CARE IF:  Your symptoms change or get worse.  You need more support at home.  You are struggling with anxiety or depression.  You develop trouble swallowing.   This information is not intended to replace advice given to you by your health care provider. Make sure you discuss any questions you have with your health care provider.   Document Released: 07/25/2002 Document Revised: 11/23/2014 Document Reviewed: 01/22/2014 Elsevier Interactive Patient Education [employer].

## 2016-04-15 NOTE — ED Notes (Signed)
Checked CBG 119, RN RICKY INFORMED

## 2016-04-23 DIAGNOSIS — Z1389 Encounter for screening for other disorder: Secondary | ICD-10-CM | POA: Diagnosis not present

## 2016-04-23 DIAGNOSIS — R569 Unspecified convulsions: Secondary | ICD-10-CM | POA: Diagnosis not present

## 2016-04-23 DIAGNOSIS — Z8679 Personal history of other diseases of the circulatory system: Secondary | ICD-10-CM | POA: Diagnosis not present

## 2016-04-23 DIAGNOSIS — E119 Type 2 diabetes mellitus without complications: Secondary | ICD-10-CM | POA: Diagnosis not present

## 2016-04-23 DIAGNOSIS — Z0000 Encounter for general adult medical examination without abnormal findings: Secondary | ICD-10-CM | POA: Diagnosis not present

## 2016-04-23 DIAGNOSIS — Z7984 Long term (current) use of oral hypoglycemic drugs: Secondary | ICD-10-CM | POA: Diagnosis not present

## 2016-04-23 DIAGNOSIS — I10 Essential (primary) hypertension: Secondary | ICD-10-CM | POA: Diagnosis not present

## 2016-04-23 DIAGNOSIS — J45909 Unspecified asthma, uncomplicated: Secondary | ICD-10-CM | POA: Diagnosis not present

## 2016-04-23 DIAGNOSIS — E039 Hypothyroidism, unspecified: Secondary | ICD-10-CM | POA: Diagnosis not present

## 2016-05-01 DIAGNOSIS — M25561 Pain in right knee: Secondary | ICD-10-CM | POA: Diagnosis not present

## 2016-05-01 DIAGNOSIS — M170 Bilateral primary osteoarthritis of knee: Secondary | ICD-10-CM | POA: Diagnosis not present

## 2016-05-01 DIAGNOSIS — M25562 Pain in left knee: Secondary | ICD-10-CM | POA: Diagnosis not present

## 2016-05-26 DIAGNOSIS — I6200 Nontraumatic subdural hemorrhage, unspecified: Secondary | ICD-10-CM | POA: Diagnosis not present

## 2016-06-02 DIAGNOSIS — I10 Essential (primary) hypertension: Secondary | ICD-10-CM | POA: Diagnosis not present

## 2016-06-02 DIAGNOSIS — H401131 Primary open-angle glaucoma, bilateral, mild stage: Secondary | ICD-10-CM | POA: Diagnosis not present

## 2016-06-02 DIAGNOSIS — E119 Type 2 diabetes mellitus without complications: Secondary | ICD-10-CM | POA: Diagnosis not present

## 2016-06-02 DIAGNOSIS — H26492 Other secondary cataract, left eye: Secondary | ICD-10-CM | POA: Diagnosis not present

## 2016-06-02 DIAGNOSIS — H18413 Arcus senilis, bilateral: Secondary | ICD-10-CM | POA: Diagnosis not present

## 2016-06-02 DIAGNOSIS — Z961 Presence of intraocular lens: Secondary | ICD-10-CM | POA: Diagnosis not present

## 2016-06-03 ENCOUNTER — Ambulatory Visit
Admission: RE | Admit: 2016-06-03 | Discharge: 2016-06-03 | Disposition: A | Discharge status: Home / Self Care | Payer: PPO | Source: Ambulatory Visit | Attending: Neurological Surgery | Admitting: Neurological Surgery

## 2016-06-03 DIAGNOSIS — I6200 Nontraumatic subdural hemorrhage, unspecified: Secondary | ICD-10-CM | POA: Diagnosis not present

## 2016-06-03 DIAGNOSIS — S065XAA Traumatic subdural hemorrhage with loss of consciousness status unknown, initial encounter: Secondary | ICD-10-CM

## 2016-06-03 DIAGNOSIS — S065X9A Traumatic subdural hemorrhage with loss of consciousness of unspecified duration, initial encounter: Secondary | ICD-10-CM

## 2016-06-04 DIAGNOSIS — M170 Bilateral primary osteoarthritis of knee: Secondary | ICD-10-CM | POA: Diagnosis not present

## 2016-06-11 DIAGNOSIS — M170 Bilateral primary osteoarthritis of knee: Secondary | ICD-10-CM | POA: Diagnosis not present

## 2016-06-18 DIAGNOSIS — M170 Bilateral primary osteoarthritis of knee: Secondary | ICD-10-CM | POA: Diagnosis not present

## 2016-06-30 DIAGNOSIS — H26491 Other secondary cataract, right eye: Secondary | ICD-10-CM | POA: Diagnosis not present

## 2016-06-30 DIAGNOSIS — Z961 Presence of intraocular lens: Secondary | ICD-10-CM | POA: Diagnosis not present

## 2016-07-31 DIAGNOSIS — M170 Bilateral primary osteoarthritis of knee: Secondary | ICD-10-CM | POA: Diagnosis not present

## 2016-08-25 DIAGNOSIS — Z8679 Personal history of other diseases of the circulatory system: Secondary | ICD-10-CM | POA: Diagnosis not present

## 2016-08-25 DIAGNOSIS — I10 Essential (primary) hypertension: Secondary | ICD-10-CM | POA: Diagnosis not present

## 2016-08-25 DIAGNOSIS — Z7984 Long term (current) use of oral hypoglycemic drugs: Secondary | ICD-10-CM | POA: Diagnosis not present

## 2016-08-25 DIAGNOSIS — E119 Type 2 diabetes mellitus without complications: Secondary | ICD-10-CM | POA: Diagnosis not present

## 2016-08-25 DIAGNOSIS — E039 Hypothyroidism, unspecified: Secondary | ICD-10-CM | POA: Diagnosis not present

## 2016-08-25 DIAGNOSIS — R569 Unspecified convulsions: Secondary | ICD-10-CM | POA: Diagnosis not present

## 2016-08-25 DIAGNOSIS — Z23 Encounter for immunization: Secondary | ICD-10-CM | POA: Diagnosis not present

## 2016-08-27 DIAGNOSIS — I6200 Nontraumatic subdural hemorrhage, unspecified: Secondary | ICD-10-CM | POA: Diagnosis not present

## 2016-08-27 DIAGNOSIS — Z6832 Body mass index (BMI) 32.0-32.9, adult: Secondary | ICD-10-CM | POA: Diagnosis not present

## 2016-10-20 DIAGNOSIS — F418 Other specified anxiety disorders: Secondary | ICD-10-CM | POA: Diagnosis not present

## 2016-10-20 DIAGNOSIS — Z8679 Personal history of other diseases of the circulatory system: Secondary | ICD-10-CM | POA: Diagnosis not present

## 2016-10-20 DIAGNOSIS — I10 Essential (primary) hypertension: Secondary | ICD-10-CM | POA: Diagnosis not present

## 2016-10-22 DIAGNOSIS — M25561 Pain in right knee: Secondary | ICD-10-CM | POA: Diagnosis not present

## 2016-10-22 DIAGNOSIS — M170 Bilateral primary osteoarthritis of knee: Secondary | ICD-10-CM | POA: Diagnosis not present

## 2016-10-22 DIAGNOSIS — M25562 Pain in left knee: Secondary | ICD-10-CM | POA: Diagnosis not present

## 2016-10-22 DIAGNOSIS — G8929 Other chronic pain: Secondary | ICD-10-CM | POA: Diagnosis not present

## 2016-11-11 DIAGNOSIS — Z6832 Body mass index (BMI) 32.0-32.9, adult: Secondary | ICD-10-CM | POA: Diagnosis not present

## 2016-11-11 DIAGNOSIS — I6200 Nontraumatic subdural hemorrhage, unspecified: Secondary | ICD-10-CM | POA: Diagnosis not present

## 2016-11-11 DIAGNOSIS — I10 Essential (primary) hypertension: Secondary | ICD-10-CM | POA: Diagnosis not present

## 2016-11-18 DIAGNOSIS — L820 Inflamed seborrheic keratosis: Secondary | ICD-10-CM | POA: Diagnosis not present

## 2016-11-25 DIAGNOSIS — I10 Essential (primary) hypertension: Secondary | ICD-10-CM | POA: Diagnosis not present

## 2016-11-25 DIAGNOSIS — E119 Type 2 diabetes mellitus without complications: Secondary | ICD-10-CM | POA: Diagnosis not present

## 2016-11-25 DIAGNOSIS — R569 Unspecified convulsions: Secondary | ICD-10-CM | POA: Diagnosis not present

## 2016-11-25 DIAGNOSIS — K219 Gastro-esophageal reflux disease without esophagitis: Secondary | ICD-10-CM | POA: Diagnosis not present

## 2016-11-25 DIAGNOSIS — Z7984 Long term (current) use of oral hypoglycemic drugs: Secondary | ICD-10-CM | POA: Diagnosis not present

## 2016-12-23 DIAGNOSIS — I6200 Nontraumatic subdural hemorrhage, unspecified: Secondary | ICD-10-CM | POA: Diagnosis not present

## 2017-01-08 ENCOUNTER — Emergency Department (HOSPITAL_COMMUNITY)
Admission: EM | Admit: 2017-01-08 | Discharge: 2017-01-08 | Disposition: A | Discharge status: Home / Self Care | Payer: PPO | Attending: Emergency Medicine | Admitting: Emergency Medicine

## 2017-01-08 ENCOUNTER — Encounter (HOSPITAL_COMMUNITY): Payer: Self-pay

## 2017-01-08 ENCOUNTER — Emergency Department (HOSPITAL_COMMUNITY): Payer: PPO

## 2017-01-08 DIAGNOSIS — I129 Hypertensive chronic kidney disease with stage 1 through stage 4 chronic kidney disease, or unspecified chronic kidney disease: Secondary | ICD-10-CM | POA: Insufficient documentation

## 2017-01-08 DIAGNOSIS — E039 Hypothyroidism, unspecified: Secondary | ICD-10-CM | POA: Diagnosis not present

## 2017-01-08 DIAGNOSIS — G9389 Other specified disorders of brain: Secondary | ICD-10-CM | POA: Diagnosis not present

## 2017-01-08 DIAGNOSIS — Z79899 Other long term (current) drug therapy: Secondary | ICD-10-CM | POA: Insufficient documentation

## 2017-01-08 DIAGNOSIS — Z87891 Personal history of nicotine dependence: Secondary | ICD-10-CM | POA: Diagnosis not present

## 2017-01-08 DIAGNOSIS — N189 Chronic kidney disease, unspecified: Secondary | ICD-10-CM | POA: Insufficient documentation

## 2017-01-08 DIAGNOSIS — J449 Chronic obstructive pulmonary disease, unspecified: Secondary | ICD-10-CM | POA: Insufficient documentation

## 2017-01-08 DIAGNOSIS — Z7984 Long term (current) use of oral hypoglycemic drugs: Secondary | ICD-10-CM | POA: Insufficient documentation

## 2017-01-08 DIAGNOSIS — E1122 Type 2 diabetes mellitus with diabetic chronic kidney disease: Secondary | ICD-10-CM | POA: Insufficient documentation

## 2017-01-08 DIAGNOSIS — R42 Dizziness and giddiness: Secondary | ICD-10-CM | POA: Diagnosis not present

## 2017-01-08 LAB — BASIC METABOLIC PANEL
Anion gap: 10 (ref 5–15)
BUN: 14 mg/dL (ref 6–20)
CO2: 23 mmol/L (ref 22–32)
Calcium: 10.2 mg/dL (ref 8.9–10.3)
Chloride: 106 mmol/L (ref 101–111)
Creatinine, Ser: 0.78 mg/dL (ref 0.44–1.00)
GFR calc Af Amer: >60 mL/min (ref >60)
GFR calc non Af Amer: >60 mL/min (ref >60)
Glucose, Bld: 185 mg/dL — ABNORMAL HIGH (ref 65–99)
Potassium: 3.7 mmol/L (ref 3.5–5.1)
Sodium: 139 mmol/L (ref 135–145)

## 2017-01-08 LAB — CBC
HCT: 42.7 % (ref 36.0–46.0)
Hemoglobin: 14.4 g/dL (ref 12.0–15.0)
MCH: 29.4 pg (ref 26.0–34.0)
MCHC: 33.7 g/dL (ref 30.0–36.0)
MCV: 87.3 fL (ref 78.0–100.0)
Platelets: 179 10*3/uL (ref 150–400)
RBC: 4.89 MIL/uL (ref 3.87–5.11)
RDW: 13.3 % (ref 11.5–15.5)
WBC: 9.9 10*3/uL (ref 4.0–10.5)

## 2017-01-08 LAB — CBG MONITORING, ED: Glucose-Capillary: 173 mg/dL — ABNORMAL HIGH (ref 65–99)

## 2017-01-08 NOTE — ED Provider Notes (Signed)
[place] DEPT Provider Note   CSN: [id number] Arrival date & time: 01/08/17  1111  By signing my name below, I, Evelene Croon, attest that this documentation has been prepared under the direction and in the presence of Virgel Manifold, MD . Electronically Signed: Evelene Croon, Scribe. 01/08/2017. 12:48 PM.   History   Chief Complaint Chief Complaint  Patient presents with  . Dizziness    The history is provided by the patient and a relative (Daughter). No language interpreter was used.    HPI Comments:  Carolyn [name] is a 83 y.o. female with a history of TBI, seizures, and subdural hematoma who presents to the Emergency Department complaining of intermittent dizziness since last night. Pt states she spins when supine but spinning sensation resolves when she is on her side. Daughter reports associated nausea. Daughter also notes pt has recently been taken off Keppra and switched to [place]. She has been having episodes where her tongue feels fat and thick and distorts her speech. Pt states her tongue has not physically appeared thicker than usual.  Pt denies HA, neck pain, tinnitus, and recent vision change. Her daughter reports an episode of blurry vision a few weeks ago but none since.   Past Medical History:  Diagnosis Date  . Asthma   . Cataracts, bilateral   . Chronic kidney disease 2007   kidney cancer and partial nephrctomy-right  . COPD (chronic obstructive pulmonary disease) ([place])   . Diabetes mellitus   . Gallstones   . Glaucoma   . Hypercholesterolemia   . Hypertension   . Hypothyroid   . PONV (postoperative nausea and vomiting)   . Subdural bleeding [facility])     Patient Active Problem List   Diagnosis Date Noted  . Subdural hematoma ([place]) 03/23/2016  . Bradycardia 03/23/2016  . Late effect of head trauma, cognitive deficits   . Dysphagia   . Seizures ([place])   . Acute lower UTI   . Altered mental status   . Atelectasis   . Spastic hemiplegia affecting right  side 02/19/2016  . S/P craniotomy 02/17/2016  . Traumatic subdural hematoma ([place]) 02/12/2016  . Headache as late effect of brain injury ([place])   . Seizure ([place])   . Difficulty controlling behavior as late effect of traumatic brain injury ([place])   . 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 Memory dysfunction as late effect of traumatic brain injury ([place])   . Benign essential HTN   . DM type 2 with diabetic peripheral neuropathy ([place])   . Thyroid activity decreased   . TBI (traumatic brain injury) ([place])   . Cognitive deficit as late effect of traumatic brain injury ([place])   . Falls frequently   . Controlled type 2 diabetes mellitus with complication, without long-term current use of insulin ([place])   . Tachycardia   . Chronic obstructive pulmonary disease ([place])   . Chronic bronchitis ([place])   . SDH (subdural hematoma) ([place]) 01/20/2016  . Fall 01/20/2016  . Closed fracture of left elbow 01/20/2016  . Diabetes mellitus without complication ([place])   . Hypercholesterolemia   . COPD (chronic obstructive pulmonary disease) ([place])   . Hypothyroid   . Essential hypertension   . Inguinal hernia unilateral, non-recurrent, left 12/02/2011    Past Surgical History:  Procedure Laterality Date  . ABDOMINAL HYSTERECTOMY    . BREAST BIOPSY    . Cateract extractions and IOL    . CHOLECYSTECTOMY  2008  . CRANIOTOMY Left 02/06/2016   Procedure: CRANIOTOMY HEMATOMA EVACUATION SUBDURAL;  Surgeon: Kevan Ny Ditty, MD;  Location: [place] NEURO ORS;  Service: Neurosurgery;  Laterality: Left;  . CRANIOTOMY N/A 02/17/2016   Procedure: CRANIOTOMY HEMATOMA EVACUATION SUBDURAL;  Surgeon: Kevan Ny Ditty, MD;  Location: [facility];  Service: Neurosurgery;  Laterality: N/A;  left  . CRANIOTOMY Left 02/18/2016   Procedure: CRANIOTOMY HEMATOMA EVACUATION SUBDURAL;  Surgeon: Earnie Larsson, MD;  Location: [place] NEURO ORS;  Service: Neurosurgery;  Laterality:  Left;  . D&C for enlarged uterine  210  . DILATION AND CURETTAGE OF UTERUS    . EYE SURGERY  08/2011,09/2012   bilateral cataracts  . HERNIA REPAIR  01/19/12   inguinal  . INGUINAL HERNIA REPAIR  01/19/2012   Procedure: HERNIA REPAIR INGUINAL ADULT;  Surgeon: Earnstine Regal, MD;  Location: [facility];  Service: General;  Laterality: Left;  repair left inguinal hernia with mesh   . PARTIAL NEPHRECTOMY     cancer  . TOTAL ABDOMINAL HYSTERECTOMY W/ BILATERAL SALPINGOOPHORECTOMY  2011    OB History    No data available       Home Medications    Prior to Admission medications   Medication Sig Start Date End Date Taking? Authorizing Provider  Fluticasone-Salmeterol (ADVAIR) 250-50 MCG/DOSE AEPB Inhale 1 puff into the lungs 2 (two) times daily as needed (for wheezing/shortness of breath). Reported on 04/15/2016    Historical Provider, MD  glimepiride (AMARYL) 2 MG tablet Take 2 tablets (4 mg total) by mouth at bedtime. 03/06/16   Lavon Paganini Angiulli, PA-C  latanoprost (XALATAN) 0.005 % ophthalmic solution Place 1 drop into both eyes at bedtime.     Historical Provider, MD  levETIRAcetam (KEPPRA) 500 MG tablet Take 1750mg  (3.5 tablets) by mouth twice daily 04/15/16   Sharlett Iles, MD  lisinopril (PRINIVIL,ZESTRIL) 20 MG tablet Take 1 tablet (20 mg total) by mouth at bedtime. 03/06/16   Lavon Paganini Angiulli, PA-C  metoprolol tartrate (LOPRESSOR) 25 MG tablet Take 1 tablet (25 mg total) by mouth 2 (two) times daily. 03/06/16   Lavon Paganini Angiulli, PA-C  pantoprazole (PROTONIX) 40 MG tablet Take 1 tablet (40 mg total) by mouth daily. 03/06/16   Lavon Paganini Angiulli, PA-C  pirbuterol (MAXAIR) 200 MCG/INH inhaler Inhale 2 puffs into the lungs 4 (four) times daily as needed for wheezing or shortness of breath.     Historical Provider, MD  polyethylene glycol (MIRALAX / GLYCOLAX) packet Take 17 g by mouth daily as needed for mild constipation.     Historical Provider, MD  theophylline (UNIPHYL) 400 MG 24 hr tablet  Take 400 mg by mouth at bedtime.     Historical Provider, MD  thyroid (ARMOUR) 60 MG tablet Take 60 mg by mouth at bedtime.     Historical Provider, MD  verapamil (CALAN-SR) 180 MG CR tablet Take 180 mg by mouth at bedtime.    Historical Provider, MD    Family History Family History  Problem Relation Age of Onset  . Hypertension Mother   . Hypertension Father   . Breast cancer Paternal Grandmother   . Diabetes Paternal Grandmother   . Hypertension Paternal Grandmother   . Stroke Paternal Grandmother   . Stroke Paternal Grandfather   . Heart attack Paternal Grandfather     Social History Social History  Substance Use Topics  . Smoking status: Former Smoker    Packs/day: 1.00    Years: 5.00    Types: Cigarettes  Quit date: 01/11/1982  . Smokeless tobacco: Never Used     Comment: quit maybe 31yrs  . Alcohol use No     Allergies   Darvocet [propoxyphene n-acetaminophen]; Metformin and related; Percocet [oxycodone-acetaminophen]; Prednisone; Topamax [topiramate]; Tramadol; and Vicodin [hydrocodone-acetaminophen]   Review of Systems Review of Systems  HENT: Negative for tinnitus.   Eyes: Negative for visual disturbance.  Gastrointestinal: Positive for nausea.  Musculoskeletal: Negative for neck pain.  Neurological: Positive for dizziness. Negative for headaches.  All other systems reviewed and are negative.    Physical Exam Updated Vital Signs BP 142/73   Pulse 62   Temp 97.8 F (36.6 C) (Oral)   Resp 20   Ht 5\' 6"  (1.676 m)   Wt 196 lb (88.9 kg)   LMP 03/24/2010   SpO2 95%   BMI 31.64 kg/m   Physical Exam  Constitutional: She is oriented to person, place, and time. She appears well-developed and well-nourished. No distress.  HENT:  Head: Normocephalic and atraumatic.  Right Ear: Tympanic membrane and external ear normal.  Left Ear: Tympanic membrane and external ear normal.  Eyes: EOM are normal.  Neck: Normal range of motion.  Cardiovascular: Normal  rate, regular rhythm and normal heart sounds.   Pulmonary/Chest: Effort normal and breath sounds normal.  Abdominal: Soft. She exhibits no distension. There is no tenderness.  Musculoskeletal: Normal range of motion.  Neurological: She is alert and oriented to person, place, and time.  Dysarthric speech but understandable Good finger to nose bilaterally   Skin: Skin is warm and dry.  Psychiatric: She has a normal mood and affect. Judgment normal.  Nursing note and vitals reviewed.    ED Treatments / Results  DIAGNOSTIC STUDIES:  Oxygen Saturation is 100% on RA, normal by my interpretation.    COORDINATION OF CARE:  12:08 PM Discussed treatment plan with pt and daughter at bedside and they agreed to plan.  Labs (all labs ordered are listed, but only abnormal results are displayed) Labs Reviewed  BASIC METABOLIC PANEL - Abnormal; Notable for the following:       Result Value   Glucose, Bld 185 (*)    All other components within normal limits  CBG MONITORING, ED - Abnormal; Notable for the following:    Glucose-Capillary 173 (*)    All other components within normal limits  CBC    EKG  EKG Interpretation None       Radiology No results found.  Procedures Procedures (including critical care time)  Medications Ordered in ED Medications - No data to display   Initial Impression / Assessment and Plan / ED Course  I have reviewed the triage vital signs and the nursing notes.  Pertinent labs & imaging results that were available during my care of the patient were reviewed by me and considered in my medical decision making (see chart for details).     83yF with likley peripheral vertigo. Doubt central cause. PRN meds. It has been determined that no acute conditions requiring further emergency intervention are present at this time. The patient has been advised of the diagnosis and plan. I reviewed any labs and imaging including any potential incidental findings. We  have discussed signs and symptoms that warrant return to the ED and they are listed in the discharge instructions.    Final Clinical Impressions(s) / ED Diagnoses   Final diagnoses:  Vertigo    New Prescriptions New Prescriptions   No medications on file   I personally preformed the  services scribed in my presence. The recorded information has been reviewed is accurate. Virgel Manifold, MD.     Virgel Manifold, MD 01/25/17 [phone]

## 2017-01-08 NOTE — ED Notes (Signed)
On way to CT 

## 2017-01-08 NOTE — ED Triage Notes (Signed)
Per Pt, Pt is coming from home with complaint of dizziness that started last night when she laid down in the bed on her back. Pt reports the dizziness continued this morning. States, "I have a clock on the ceiling and it wasn't moving, but I felt like I was moving." Hx of Seizures and in the middle of switching medications from [employer] to [place].

## 2017-01-20 DIAGNOSIS — J069 Acute upper respiratory infection, unspecified: Secondary | ICD-10-CM | POA: Diagnosis not present

## 2017-01-20 DIAGNOSIS — I6200 Nontraumatic subdural hemorrhage, unspecified: Secondary | ICD-10-CM | POA: Diagnosis not present

## 2017-01-27 ENCOUNTER — Encounter: Payer: Self-pay | Admitting: Neurology

## 2017-01-27 ENCOUNTER — Ambulatory Visit (INDEPENDENT_AMBULATORY_CARE_PROVIDER_SITE_OTHER): Payer: PPO | Admitting: Neurology

## 2017-01-27 VITALS — BP 132/78 | HR 78

## 2017-01-27 DIAGNOSIS — Z9189 Other specified personal risk factors, not elsewhere classified: Secondary | ICD-10-CM | POA: Diagnosis not present

## 2017-01-27 DIAGNOSIS — I6200 Nontraumatic subdural hemorrhage, unspecified: Secondary | ICD-10-CM | POA: Diagnosis not present

## 2017-01-27 DIAGNOSIS — S065XAA Traumatic subdural hemorrhage with loss of consciousness status unknown, initial encounter: Secondary | ICD-10-CM

## 2017-01-27 DIAGNOSIS — S065X9A Traumatic subdural hemorrhage with loss of consciousness of unspecified duration, initial encounter: Secondary | ICD-10-CM

## 2017-01-27 NOTE — Progress Notes (Signed)
NEUROLOGY CONSULTATION NOTE  Carolyn [name] MRN: [id number] DOB: 13-Mar-1933  Referring provider: Dr. Cyndy Freeze Primary care provider: Dr. [name]  Reason for consult:  SDH, antiepileptic medication management  HISTORY OF PRESENT ILLNESS: Carolyn [name] is an 83 year old right-handed female with hypertension, diabetes mellitus and COPD who presents for management of antiepileptic medication.  She is accompanied by her daughter, who supplements history.  She sustained a traumatic subdural hematoma on 01/20/16 after hitting her head from a fall when being pulled by her dog during a walk.  CT of head showed small bilateral subdural hematoma.  She was monitored overnight.  Neurosurgery assessed her and didn't feel intervention was warranted.  She was discharged and then developed headaches and slurred speech.  She returned to the ED on 02/06/16.    CT of head revealed left greater than right subdural hematoma with mass effect, requiring evacuation via left craniotomy.  She was started on Keppra 1000mg  twice daily for suspected seizure.  She was also started on topiramate for headache.  In rehab, she exhibited waxing and waning mental status as well as worsening aphasia and right sided weakness.  Repeat CT of head from 02/14/16 and 02/17/16 were stable, revealing unchanged left 6 mm SHD with rightward midline shift.  Due to cognitive decline, she returned to the OR on 02/17/16 and underwent another evacuation.  Postoperatively, she continued to have worsening aphasia and rights sided weakness.  Follow up CT of head revealed reaccumulation of left SDH with mass effect, requiring another evacuation.  EEG from 02/21/16 revealed intermittent left central epileptiform discharges.  Keppra was increased to 1250mg  twice daily.  She later developed right hand tremor and Keppra was further increased to 1750mg  twice daily.  Due to worsening mood, she was transitioned from [place] to Vimpat.  Mood improved but she developed "thick  tongue".  Thought to be a symptom of Keppra, she subsequently discontinued the Keppra and Vimpat was increased to 75mg  twice daily.  The thick tongue sensation has persisted.  She also feels that she is pocketing her food in the right side of her mouth at times.  She denies new right sided weakness.  She presented to the ED on 01/08/17 for BPPV.  CT of head demonstrated stable posterior left frontal encephalomalacia and postsurgical changes from craniotomy.  PAST MEDICAL HISTORY: Past Medical History:  Diagnosis Date  . Asthma   . Cataracts, bilateral   . Chronic kidney disease 2007   kidney cancer and partial nephrctomy-right  . COPD (chronic obstructive pulmonary disease) ([place])   . Diabetes mellitus   . Gallstones   . Glaucoma   . Hypercholesterolemia   . Hypertension   . Hypothyroid   . PONV (postoperative nausea and vomiting)   . Subdural bleeding ([place])     PAST SURGICAL HISTORY: Past Surgical History:  Procedure Laterality Date  . ABDOMINAL HYSTERECTOMY    . BREAST BIOPSY    . Cateract extractions and IOL    . CHOLECYSTECTOMY  2008  . CRANIOTOMY Left 02/06/2016   Procedure: CRANIOTOMY HEMATOMA EVACUATION SUBDURAL;  Surgeon: Kevan Ny Ditty, MD;  Location: [facility];  Service: Neurosurgery;  Laterality: Left;  . CRANIOTOMY N/A 02/17/2016   Procedure: CRANIOTOMY HEMATOMA EVACUATION SUBDURAL;  Surgeon: Kevan Ny Ditty, MD;  Location: [facility];  Service: Neurosurgery;  Laterality: N/A;  left  . CRANIOTOMY Left 02/18/2016   Procedure: CRANIOTOMY HEMATOMA EVACUATION SUBDURAL;  Surgeon: Earnie Larsson, MD;  Location: [facility];  Service: Neurosurgery;  Laterality: Left;  . D&C for enlarged uterine  210  . DILATION AND CURETTAGE OF UTERUS    . EYE SURGERY  08/2011,09/2012   bilateral cataracts  . HERNIA REPAIR  01/19/12   inguinal  . INGUINAL HERNIA REPAIR  01/19/2012   Procedure: HERNIA REPAIR INGUINAL ADULT;  Surgeon: Earnstine Regal, MD;  Location: [facility];  Service:  General;  Laterality: Left;  repair left inguinal hernia with mesh   . PARTIAL NEPHRECTOMY     cancer  . TOTAL ABDOMINAL HYSTERECTOMY W/ BILATERAL SALPINGOOPHORECTOMY  2011    MEDICATIONS: Current Outpatient Prescriptions on File Prior to Visit  Medication Sig Dispense Refill  . docusate sodium (COLACE) 100 MG capsule Take 100 mg by mouth daily as needed for mild constipation.    [name] glimepiride (AMARYL) 2 MG tablet Take 2 tablets (4 mg total) by mouth at bedtime. (Patient taking differently: Take 1 mg by mouth at bedtime. ) 30 tablet 1  . latanoprost (XALATAN) 0.005 % ophthalmic solution Place 1 drop into both eyes at bedtime.     [name] lisinopril (PRINIVIL,ZESTRIL) 20 MG tablet Take 1 tablet (20 mg total) by mouth at bedtime. 30 tablet 1  . pantoprazole (PROTONIX) 40 MG tablet Take 1 tablet (40 mg total) by mouth daily. 30 tablet 1  . theophylline (UNIPHYL) 400 MG 24 hr tablet Take 400 mg by mouth at bedtime.     [name] thyroid (ARMOUR) 60 MG tablet Take 60 mg by mouth at bedtime.     . verapamil (CALAN-SR) 180 MG CR tablet Take 180 mg by mouth at bedtime.    [name] VIMPAT 50 MG TABS tablet Take 75 mg by mouth 2 (two) times daily.  0  . levETIRAcetam (KEPPRA) 500 MG tablet Take 1750mg  (3.5 tablets) by mouth twice daily (Patient not taking: Reported on 01/08/2017) 100 tablet 0  . metoprolol tartrate (LOPRESSOR) 25 MG tablet Take 1 tablet (25 mg total) by mouth 2 (two) times daily. (Patient not taking: Reported on 01/08/2017) 60 tablet 1   No current facility-administered medications on file prior to visit.     ALLERGIES: Allergies  Allergen Reactions  . Darvocet [Propoxyphene N-Acetaminophen] Nausea And Vomiting  . Metformin And Related Diarrhea  . Percocet [Oxycodone-Acetaminophen] Nausea And Vomiting  . Prednisone Swelling  . Topamax [Topiramate] Other (See Comments)    Unable to speak/swallow  . Tramadol Other (See Comments)    Masked seizure symptoms  . Vimpat [Lacosamide] Swelling    tongue    . Vicodin [Hydrocodone-Acetaminophen] Nausea And Vomiting    FAMILY HISTORY: Family History  Problem Relation Age of Onset  . Hypertension Mother   . Hypertension Father   . Breast cancer Paternal Grandmother   . Diabetes Paternal Grandmother   . Hypertension Paternal Grandmother   . Stroke Paternal Grandmother   . Stroke Paternal Grandfather   . Heart attack Paternal Grandfather     SOCIAL HISTORY: Social History   Social History  . Marital status: Married    Spouse name: N/A  . Number of children: N/A  . Years of education: N/A   Occupational History  . Not on file.   Social History Main Topics  . Smoking status: Former Smoker    Packs/day: 1.00    Years: 5.00    Types: Cigarettes    Quit date: 01/11/1982  . Smokeless tobacco: Never Used     Comment: quit maybe 26yrs  . Alcohol use No  . Drug use: No  . Sexual activity:  Not on file   Other Topics Concern  . Not on file   Social History Narrative  . No narrative on file    REVIEW OF SYSTEMS: Constitutional: No fevers, chills, or sweats, no generalized fatigue, change in appetite Eyes: No visual changes, double vision, eye pain Ear, nose and throat: No hearing loss, ear pain, nasal congestion, sore throat Cardiovascular: No chest pain, palpitations Respiratory:  No shortness of breath at rest or with exertion, wheezes GastrointestinaI: No nausea, vomiting, diarrhea, abdominal pain, fecal incontinence Genitourinary:  No dysuria, urinary retention or frequency Musculoskeletal:  No neck pain, back pain Integumentary: No rash, pruritus, skin lesions Neurological: as above Psychiatric: No depression, insomnia, anxiety Endocrine: No palpitations, fatigue, diaphoresis, mood swings, change in appetite, change in weight, increased thirst Hematologic/Lymphatic:  No purpura, petechiae. Allergic/Immunologic: no itchy/runny eyes, nasal congestion, recent allergic reactions, rashes  PHYSICAL EXAM: Vitals:    01/27/17 1412  BP: 132/78  Pulse: 78   General: No acute distress.  Patient appears well-groomed.  Head:  Normocephalic/atraumatic Eyes:  fundi examined but not visualized Neck: supple, no paraspinal tenderness, full range of motion Back: No paraspinal tenderness Heart: regular rate and rhythm Lungs: Clear to auscultation bilaterally. Vascular: No carotid bruits. Neurological Exam: Mental status: alert and oriented to person, place, and time, recent and remote memory intact, fund of knowledge intact, attention and concentration intact, speech fluent but dysarthric, language intact. Cranial nerves: CN I: not tested CN II: pupils equal, round and reactive to light, visual fields intact CN III, IV, VI:  full range of motion, no nystagmus, no ptosis CN V: facial sensation intact CN VII: upper and lower face symmetric CN VIII: hearing intact CN IX, X: gag intact, uvula midline CN XI: sternocleidomastoid and trapezius muscles intact CN XII: tongue midline Bulk & Tone: normal, no fasciculations. Motor:  5/5 throughout except 3+/5 hip flexion bilaterally (limited due to arthritic knee pain). Sensation: temperature and vibration sensation intact. Deep Tendon Reflexes:  2+ throughout, toes downgoing.  Finger to nose testing:  Without dysmetria.  Heel to shin:  Unable to assess Gait:  In wheelchair due to arthritic knee pain.  IMPRESSION: 1.  History of subdural hematoma, status post craniotomy 2.  Questionable seizure at time of TBI.  Given the previous EEG and her increased risk for seizures, I would recommend remaining on anticonvulsant therapy. 3.  "Thick tongue". Possible side effect of Vimpat.  She states onset occured when she was started on Vimpat.  Consider underlying stroke (although she has no new lateralizing symptoms).  PLAN: 1.  Given the craniotomy, we will see if she can get an MRI of brain to rule out subacute stroke.  If subacute stroke is present, consider restarting ASA  at 81mg  daily. 2.  If MRI cannot be obtained, I would like to transition from Vimpat to Lamictal to see if symptoms involving her tongue improve. 3.  Follow up in 6 weeks.  Thank you for allowing me to take part in the care of this patient.  Metta Clines, DO  CC:  Lavone Orn, MD  Cyndy Freeze, MD

## 2017-01-27 NOTE — Patient Instructions (Signed)
1.  Will see if we can get an MRI.  If not, we will switch from Vimpat to Lamictal 2.  Follow up

## 2017-01-28 ENCOUNTER — Telehealth: Payer: Self-pay

## 2017-01-28 DIAGNOSIS — S065XAA Traumatic subdural hemorrhage with loss of consciousness status unknown, initial encounter: Secondary | ICD-10-CM

## 2017-01-28 DIAGNOSIS — M170 Bilateral primary osteoarthritis of knee: Secondary | ICD-10-CM | POA: Diagnosis not present

## 2017-01-28 DIAGNOSIS — S065X9A Traumatic subdural hemorrhage with loss of consciousness of unspecified duration, initial encounter: Secondary | ICD-10-CM

## 2017-01-28 DIAGNOSIS — R569 Unspecified convulsions: Secondary | ICD-10-CM

## 2017-01-28 DIAGNOSIS — R471 Dysarthria and anarthria: Secondary | ICD-10-CM

## 2017-01-28 DIAGNOSIS — M1712 Unilateral primary osteoarthritis, left knee: Secondary | ICD-10-CM | POA: Diagnosis not present

## 2017-01-28 DIAGNOSIS — M1711 Unilateral primary osteoarthritis, right knee: Secondary | ICD-10-CM | POA: Diagnosis not present

## 2017-01-28 NOTE — Telephone Encounter (Signed)
Yes, let's order MRI of brain without contrast to evaluate for stroke in patient with dysarthria and history of subdural hematoma

## 2017-01-28 NOTE — Telephone Encounter (Signed)
Done

## 2017-01-28 NOTE — Telephone Encounter (Signed)
Daughter left messge w/ front stating surgeon says ok to have MRI but would check w/ imaging location to confirm. Called [facility],  per Judson Roch ok to proceed w/ MRI.

## 2017-02-01 ENCOUNTER — Telehealth: Payer: Self-pay | Admitting: Neurology

## 2017-02-01 NOTE — Telephone Encounter (Signed)
PT's daughter called and said she has not heard anything about the MRI appointment and wants to know why they have not been contacted yet/Dawn CB# [phone]

## 2017-02-01 NOTE — Telephone Encounter (Signed)
Left dghtr vmail explaining submitted OV notes for PA review.

## 2017-02-01 NOTE — Telephone Encounter (Addendum)
Spoke to daughter. Requesting to know status of MRI. Confirmed MRI scheduled 03/28. Pt would like MRI earlier than date scheduled. Advised would check on status of PA and call back w/ update. Dghter states ok to leave vmail.  Faxed OV notes for PA review.

## 2017-02-04 NOTE — Telephone Encounter (Signed)
Left vmail on dghtr's phone informing checked status of PA. Per rep @ [facility] [phone]) PA still pending.

## 2017-02-10 ENCOUNTER — Ambulatory Visit
Admission: RE | Admit: 2017-02-10 | Discharge: 2017-02-10 | Disposition: A | Discharge status: Home / Self Care | Payer: PPO | Source: Ambulatory Visit | Attending: Neurology | Admitting: Neurology

## 2017-02-10 DIAGNOSIS — R569 Unspecified convulsions: Secondary | ICD-10-CM

## 2017-02-10 DIAGNOSIS — R471 Dysarthria and anarthria: Secondary | ICD-10-CM

## 2017-02-10 DIAGNOSIS — G9389 Other specified disorders of brain: Secondary | ICD-10-CM | POA: Diagnosis not present

## 2017-02-10 DIAGNOSIS — S065X9A Traumatic subdural hemorrhage with loss of consciousness of unspecified duration, initial encounter: Secondary | ICD-10-CM

## 2017-02-10 DIAGNOSIS — S065XAA Traumatic subdural hemorrhage with loss of consciousness status unknown, initial encounter: Secondary | ICD-10-CM

## 2017-02-15 ENCOUNTER — Telehealth: Payer: Self-pay

## 2017-02-15 NOTE — Telephone Encounter (Signed)
-----   Message from Pieter Partridge, DO sent at 02/15/2017  6:53 AM EDT ----- MRI shows no recent stroke, only old findings related to the bleed.  I would like to start Lamictal XR.  Lamotrigine XR (Lamictal)  Week 1&2: 25mg  daily Week 3&4: 50mg  daily Week 5:     100mg  daily  At the beginning of week 5, call the clinic, so we can taper off of Vimpat.

## 2017-02-15 NOTE — Telephone Encounter (Signed)
Called patient. No answer. Left message on daughter [name]'s vmail.

## 2017-02-15 NOTE — Telephone Encounter (Signed)
Patient daughter called you back and held for a few mins but you were on a different call please call her back at [phone]

## 2017-02-16 ENCOUNTER — Telehealth: Payer: Self-pay | Admitting: Neurology

## 2017-02-16 MED ORDER — LAMOTRIGINE ER 25 MG PO TB24: 25.0000 mg | ORAL_TABLET | Freq: Every day | ORAL | 1 refills | Status: DC

## 2017-02-16 NOTE — Telephone Encounter (Signed)
[name] (daughter) called back with a questions regarding her Vimpat medication. Thanks

## 2017-02-16 NOTE — Telephone Encounter (Signed)
Dianne called back. She said the medication was not at the pharmacy.

## 2017-02-16 NOTE — Telephone Encounter (Signed)
Spoke to daughter. Gave instructions per Dr. Georgie Chard previous note. States pt is having crying spells, tongue is thicker, and she can barely talk. Ok to taper to Vimpat 50mg  per Dr. Tomi Likens. Dghter verbalized understanding.

## 2017-02-16 NOTE — Telephone Encounter (Signed)
Spoke to daughter. Advised Rx sent to [facility]. Confirmed will check status.  Daughter agreed.

## 2017-03-08 DIAGNOSIS — T07XXXA Unspecified multiple injuries, initial encounter: Secondary | ICD-10-CM | POA: Diagnosis not present

## 2017-03-17 ENCOUNTER — Telehealth: Payer: Self-pay | Admitting: Neurology

## 2017-03-17 NOTE — Telephone Encounter (Signed)
Caller: [name]  Urgent? No  Reason for the call: 5 Week update on medication

## 2017-03-17 NOTE — Telephone Encounter (Signed)
Left message for Carolyn [name] to call back with 5 week update on medication.

## 2017-03-17 NOTE — Telephone Encounter (Signed)
Patient has stopped the vimpat.  She was having trouble with speech while on this medication.  Her left hand tremor has gotten better and some short term memory has gotten better.  Patient has started the lamical.  She is sleeping better.  Patient daughter is happy with how her mom is doing.  Patient is driving.  [name] patients daughter will continue to monitor her mother.  Please advise if any other changes need to be done at this time.

## 2017-03-18 NOTE — Telephone Encounter (Signed)
Advised Carolyn [name] patients daughter to have patient continue lamictal as directed and call with any issues.

## 2017-03-18 NOTE — Telephone Encounter (Signed)
I am glad she is feeling better. Only recommendation is to continue the Lamictal XR titration schedule up to 100mg  daily.

## 2017-03-22 ENCOUNTER — Telehealth: Payer: Self-pay | Admitting: Neurology

## 2017-03-22 NOTE — Telephone Encounter (Signed)
I would give the medication some time.  What dose is she on?  Did these symptoms start only after starting a new dose?

## 2017-03-22 NOTE — Telephone Encounter (Signed)
Patient speech and mood is getting worse but [name] will give the medication some more time.

## 2017-03-22 NOTE — Telephone Encounter (Signed)
PT's daughter call and said PT is feeling nauseated, moods are bad, speech worse since last Tuesday taking the medication Lamictal/Dawn

## 2017-03-23 ENCOUNTER — Other Ambulatory Visit: Payer: Self-pay

## 2017-03-23 ENCOUNTER — Telehealth: Payer: Self-pay

## 2017-03-23 ENCOUNTER — Telehealth: Payer: Self-pay | Admitting: Neurology

## 2017-03-23 DIAGNOSIS — R569 Unspecified convulsions: Secondary | ICD-10-CM

## 2017-03-23 NOTE — Telephone Encounter (Signed)
Patient complains of nausea now.  She did not have this problem prior.  Her finger was shaking.  Patient does complain of dizziness.  Patient daughter says the patient did not eat this morning because of the nausea.  Patient has taken pepto bismol and drank some 7up but threw up.  Patient says her last meal was last night and she had watermelon she says.  Please advise.

## 2017-03-23 NOTE — Telephone Encounter (Signed)
VM-PT's daughter left a message she is having more symptoms and needs a call back/Dawn

## 2017-03-23 NOTE — Telephone Encounter (Signed)
Left message informing Carolyn [name] patients daughter that EEG is scheduled 03/29/2017 at 1pm.

## 2017-03-23 NOTE — Telephone Encounter (Signed)
Informed patients daughter that we want to do EEG.  Order placed to determine if she needs to stay on seizure medications.

## 2017-03-23 NOTE — Telephone Encounter (Signed)
My suspicion is that her symptoms are not related to the Lamictal as she has had these symptoms with every anti-seizure medication that has been tried.  However, I would like to repeat a routine EEG.  If the EEG does not reveal any abnormalities to suggest increased risk for seizure, then we can try having her off seizure medications.

## 2017-03-24 ENCOUNTER — Telehealth: Payer: Self-pay

## 2017-03-24 ENCOUNTER — Emergency Department (HOSPITAL_COMMUNITY): Payer: PPO

## 2017-03-24 ENCOUNTER — Emergency Department (HOSPITAL_COMMUNITY)
Admission: EM | Admit: 2017-03-24 | Discharge: 2017-03-24 | Disposition: A | Discharge status: Home / Self Care | Payer: PPO | Attending: Emergency Medicine | Admitting: Emergency Medicine

## 2017-03-24 ENCOUNTER — Telehealth: Payer: Self-pay | Admitting: Neurology

## 2017-03-24 ENCOUNTER — Encounter (HOSPITAL_COMMUNITY): Payer: Self-pay | Admitting: Emergency Medicine

## 2017-03-24 DIAGNOSIS — I129 Hypertensive chronic kidney disease with stage 1 through stage 4 chronic kidney disease, or unspecified chronic kidney disease: Secondary | ICD-10-CM | POA: Insufficient documentation

## 2017-03-24 DIAGNOSIS — R4781 Slurred speech: Secondary | ICD-10-CM | POA: Insufficient documentation

## 2017-03-24 DIAGNOSIS — N189 Chronic kidney disease, unspecified: Secondary | ICD-10-CM | POA: Diagnosis not present

## 2017-03-24 DIAGNOSIS — R112 Nausea with vomiting, unspecified: Secondary | ICD-10-CM | POA: Diagnosis not present

## 2017-03-24 DIAGNOSIS — E1122 Type 2 diabetes mellitus with diabetic chronic kidney disease: Secondary | ICD-10-CM | POA: Diagnosis not present

## 2017-03-24 DIAGNOSIS — J449 Chronic obstructive pulmonary disease, unspecified: Secondary | ICD-10-CM | POA: Diagnosis not present

## 2017-03-24 DIAGNOSIS — Z7984 Long term (current) use of oral hypoglycemic drugs: Secondary | ICD-10-CM | POA: Insufficient documentation

## 2017-03-24 DIAGNOSIS — E039 Hypothyroidism, unspecified: Secondary | ICD-10-CM | POA: Insufficient documentation

## 2017-03-24 DIAGNOSIS — Z79899 Other long term (current) drug therapy: Secondary | ICD-10-CM | POA: Diagnosis not present

## 2017-03-24 DIAGNOSIS — Z87891 Personal history of nicotine dependence: Secondary | ICD-10-CM | POA: Insufficient documentation

## 2017-03-24 DIAGNOSIS — E1140 Type 2 diabetes mellitus with diabetic neuropathy, unspecified: Secondary | ICD-10-CM | POA: Diagnosis not present

## 2017-03-24 DIAGNOSIS — R110 Nausea: Secondary | ICD-10-CM | POA: Diagnosis not present

## 2017-03-24 LAB — URINALYSIS, ROUTINE W REFLEX MICROSCOPIC
Bilirubin Urine: NEGATIVE
Glucose, UA: 50 mg/dL — AB
Hgb urine dipstick: NEGATIVE
Ketones, ur: 20 mg/dL — AB
Nitrite: NEGATIVE
Protein, ur: 100 mg/dL — AB
Specific Gravity, Urine: 1.021 (ref 1.005–1.030)
pH: 6 (ref 5.0–8.0)

## 2017-03-24 LAB — CBC
HCT: 43.5 % (ref 36.0–46.0)
Hemoglobin: 14.4 g/dL (ref 12.0–15.0)
MCH: 28.9 pg (ref 26.0–34.0)
MCHC: 33.1 g/dL (ref 30.0–36.0)
MCV: 87.2 fL (ref 78.0–100.0)
Platelets: 167 10*3/uL (ref 150–400)
RBC: 4.99 MIL/uL (ref 3.87–5.11)
RDW: 13.8 % (ref 11.5–15.5)
WBC: 6.7 10*3/uL (ref 4.0–10.5)

## 2017-03-24 LAB — COMPREHENSIVE METABOLIC PANEL
ALT: 12 U/L — ABNORMAL LOW (ref 14–54)
AST: 18 U/L (ref 15–41)
Albumin: 4.1 g/dL (ref 3.5–5.0)
Alkaline Phosphatase: 82 U/L (ref 38–126)
Anion gap: 11 (ref 5–15)
BUN: 5 mg/dL — ABNORMAL LOW (ref 6–20)
CO2: 25 mmol/L (ref 22–32)
Calcium: 10.1 mg/dL (ref 8.9–10.3)
Chloride: 103 mmol/L (ref 101–111)
Creatinine, Ser: 0.76 mg/dL (ref 0.44–1.00)
GFR calc Af Amer: >60 mL/min (ref >60)
GFR calc non Af Amer: >60 mL/min (ref >60)
Glucose, Bld: 151 mg/dL — ABNORMAL HIGH (ref 65–99)
Potassium: 3.4 mmol/L — ABNORMAL LOW (ref 3.5–5.1)
Sodium: 139 mmol/L (ref 135–145)
Total Bilirubin: 1.2 mg/dL (ref 0.3–1.2)
Total Protein: 7.1 g/dL (ref 6.5–8.1)

## 2017-03-24 LAB — LIPASE, BLOOD: Lipase: 18 U/L (ref 11–51)

## 2017-03-24 MED ORDER — ONDANSETRON 4 MG PO TBDP
4.0000 mg | ORAL_TABLET | Freq: Once | ORAL | Status: AC | PRN
Start: 2017-03-24 — End: 2017-03-24
  Administered 2017-03-24: 4 mg via ORAL

## 2017-03-24 MED ORDER — SODIUM CHLORIDE 0.9 % IV BOLUS (SEPSIS)
500.0000 mL | Freq: Once | INTRAVENOUS | Status: AC
  Administered 2017-03-24: 500 mL via INTRAVENOUS

## 2017-03-24 MED ORDER — ONDANSETRON 4 MG PO TBDP
ORAL_TABLET | ORAL | Status: AC
  Filled 2017-03-24: qty 1

## 2017-03-24 MED ORDER — ONDANSETRON HCL 4 MG PO TABS: 4.0000 mg | ORAL_TABLET | Freq: Three times a day (TID) | ORAL | 0 refills | Status: AC | PRN

## 2017-03-24 NOTE — ED Provider Notes (Signed)
[facility] DEPT Provider Note   CSN: [id number] Arrival date & time: 03/24/17  1351     History   Chief Complaint Chief Complaint  Patient presents with  . Emesis    HPI WYNETTE JERSEY is a 83 y.o. female.  Patient with hx of subdural hematoma, DM who presents to ED with nausea. Patient with symptoms for one week but no diarrhea and intermittently with vomiting. Patient able to tolerate some foods and fluid. No dysuria, no constipation. Patient recently took doxycyline and on lamictal now and caregiver concerned causing nausea feeling because patient just started it last week.    The history is provided by the patient and a caregiver.  Illness  This is a new problem. The current episode started more than 2 days ago. The problem occurs daily. The problem has not changed since onset.Pertinent negatives include no chest pain, no abdominal pain, no headaches and no shortness of breath. Nothing aggravates the symptoms. Nothing relieves the symptoms.    Past Medical History:  Diagnosis Date  . Asthma   . Cataracts, bilateral   . Chronic kidney disease 2007   kidney cancer and partial nephrctomy-right  . COPD (chronic obstructive pulmonary disease) ([place])   . Diabetes mellitus   . Gallstones   . Glaucoma   . Hypercholesterolemia   . Hypertension   . Hypothyroid   . PONV (postoperative nausea and vomiting)   . Subdural bleeding [facility])     Patient Active Problem List   Diagnosis Date Noted  . Subdural hematoma ([place]) 03/23/2016  . Bradycardia 03/23/2016  . Late effect of head trauma, cognitive deficits   . Dysphagia   . Seizures ([place])   . Acute lower UTI   . Altered mental status   . Atelectasis   . Spastic hemiplegia affecting right side 02/19/2016  . S/P craniotomy 02/17/2016  . Traumatic subdural hematoma ([place]) 02/12/2016  . Headache as late effect of brain injury ([place])   . Seizure ([place])   . Difficulty controlling behavior as late effect of traumatic brain injury  ([place])   . 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 Memory dysfunction as late effect of traumatic brain injury ([place])   . Benign essential HTN   . DM type 2 with diabetic peripheral neuropathy ([place])   . Thyroid activity decreased   . TBI (traumatic brain injury) ([place])   . Cognitive deficit as late effect of traumatic brain injury ([place])   . Falls frequently   . Controlled type 2 diabetes mellitus with complication, without long-term current use of insulin ([place])   . Tachycardia   . Chronic obstructive pulmonary disease ([place])   . Chronic bronchitis ([place])   . SDH (subdural hematoma) ([place]) 01/20/2016  . Fall 01/20/2016  . Closed fracture of left elbow 01/20/2016  . Diabetes mellitus without complication ([place])   . Hypercholesterolemia   . COPD (chronic obstructive pulmonary disease) ([place])   . Hypothyroid   . Essential hypertension   . Inguinal hernia unilateral, non-recurrent, left 12/02/2011    Past Surgical History:  Procedure Laterality Date  . ABDOMINAL HYSTERECTOMY    . BREAST BIOPSY    . Cateract extractions and IOL    . CHOLECYSTECTOMY  2008  . CRANIOTOMY Left 02/06/2016   Procedure: CRANIOTOMY HEMATOMA EVACUATION SUBDURAL;  Surgeon: Kevan Ny Ditty, MD;  Location: [facility] [place] NEURO ORS;  Service: Neurosurgery;  Laterality: Left;  . CRANIOTOMY N/A 02/17/2016   Procedure: CRANIOTOMY  HEMATOMA EVACUATION SUBDURAL;  Surgeon: Kevan Ny Ditty, MD;  Location: [facility] NEURO ORS;  Service: Neurosurgery;  Laterality: N/A;  left  . CRANIOTOMY Left 02/18/2016   Procedure: CRANIOTOMY HEMATOMA EVACUATION SUBDURAL;  Surgeon: Earnie Larsson, MD;  Location: [facility] NEURO ORS;  Service: Neurosurgery;  Laterality: Left;  . D&C for enlarged uterine  210  . DILATION AND CURETTAGE OF UTERUS    . EYE SURGERY  08/2011,09/2012   bilateral cataracts  . HERNIA REPAIR  01/19/12   inguinal  . INGUINAL HERNIA REPAIR  01/19/2012   Procedure: HERNIA REPAIR  INGUINAL ADULT;  Surgeon: Earnstine Regal, MD;  Location: [facility] ORS;  Service: General;  Laterality: Left;  repair left inguinal hernia with mesh   . PARTIAL NEPHRECTOMY     cancer  . TOTAL ABDOMINAL HYSTERECTOMY W/ BILATERAL SALPINGOOPHORECTOMY  2011    OB History    No data available       Home Medications    Prior to Admission medications   Medication Sig Start Date End Date Taking? Authorizing Provider  docusate sodium (COLACE) 100 MG capsule Take 100 mg by mouth daily as needed for mild constipation.    [provider]  glimepiride (AMARYL) 2 MG tablet Take 2 tablets (4 mg total) by mouth at bedtime. Patient taking differently: Take 1 mg by mouth at bedtime.  03/06/16   Angiulli, Lavon Paganini, PA-C  LamoTRIgine (LAMICTAL XR) 25 MG TB24 tablet Take 1 tablet (25 mg total) by mouth daily. 02/16/17   Tomi Likens, Dezyrae Kensinger R, DO  latanoprost (XALATAN) 0.005 % ophthalmic solution Place 1 drop into both eyes at bedtime.     [provider]  levETIRAcetam (KEPPRA) 500 MG tablet Take 1750mg  (3.5 tablets) by mouth twice daily Patient not taking: Reported on 01/08/2017 04/15/16   Little, Wenda Overland, MD  lisinopril (PRINIVIL,ZESTRIL) 20 MG tablet Take 1 tablet (20 mg total) by mouth at bedtime. 03/06/16   Angiulli, Lavon Paganini, PA-C  metoprolol tartrate (LOPRESSOR) 25 MG tablet Take 1 tablet (25 mg total) by mouth 2 (two) times daily. Patient not taking: Reported on 01/08/2017 03/06/16   Angiulli, Lavon Paganini, PA-C  ondansetron (ZOFRAN) 4 MG tablet Take 1 tablet (4 mg total) by mouth every 8 (eight) hours as needed for nausea or vomiting. 03/24/17   Kristene Liberati, DO  pantoprazole (PROTONIX) 40 MG tablet Take 1 tablet (40 mg total) by mouth daily. 03/06/16   Angiulli, Lavon Paganini, PA-C  theophylline (UNIPHYL) 400 MG 24 hr tablet Take 400 mg by mouth at bedtime.     [provider]  thyroid (ARMOUR) 60 MG tablet Take 60 mg by mouth at bedtime.     [provider]  verapamil (CALAN-SR) 180 MG  CR tablet Take 180 mg by mouth at bedtime.    [provider]  VIMPAT 50 MG TABS tablet Take 75 mg by mouth 2 (two) times daily. 01/04/17   [provider]    Family History Family History  Problem Relation Age of Onset  . Hypertension Mother   . Hypertension Father   . Breast cancer Paternal Grandmother   . Diabetes Paternal Grandmother   . Hypertension Paternal Grandmother   . Stroke Paternal Grandmother   . Stroke Paternal Grandfather   . Heart attack Paternal Grandfather     Social History Social History  Substance Use Topics  . Smoking status: Former Smoker    Packs/day: 1.00    Years: 5.00    Types: Cigarettes    Quit  date: 01/11/1982  . Smokeless tobacco: Never Used     Comment: quit maybe 34yrs  . Alcohol use No     Allergies   Darvocet [propoxyphene n-acetaminophen]; Metformin and related; Percocet [oxycodone-acetaminophen]; Prednisone; Topamax [topiramate]; Tramadol; Vimpat [lacosamide]; and Vicodin [hydrocodone-acetaminophen]   Review of Systems Review of Systems  Constitutional: Negative for chills and fever.  HENT: Negative for ear pain and sore throat.   Eyes: Negative for pain and visual disturbance.  Respiratory: Negative for cough and shortness of breath.   Cardiovascular: Negative for chest pain and palpitations.  Gastrointestinal: Positive for nausea. Negative for abdominal pain and vomiting.  Genitourinary: Negative for dysuria and hematuria.  Musculoskeletal: Negative for arthralgias and back pain.  Skin: Negative for color change and rash.  Neurological: Negative for seizures, syncope and headaches.  All other systems reviewed and are negative.    Physical Exam Updated Vital Signs BP (!) 151/86   Pulse 67   Temp 98.4 F (36.9 C) (Oral)   Resp 18   LMP 03/24/2010   SpO2 93%   Physical Exam  Constitutional: She is oriented to person, place, and time. She appears well-developed and well-nourished. No distress.  HENT:    Head: Normocephalic and atraumatic.  Eyes: Conjunctivae are normal. Pupils are equal, round, and reactive to light.  Neck: Neck supple.  Cardiovascular: Normal rate, regular rhythm, normal heart sounds and intact distal pulses.   No murmur heard. Pulmonary/Chest: Effort normal and breath sounds normal. No respiratory distress.  Abdominal: Soft. Bowel sounds are normal. She exhibits no distension. There is no tenderness.  Musculoskeletal: She exhibits no edema.  Neurological: She is alert and oriented to person, place, and time. No cranial nerve deficit or sensory deficit. She exhibits normal muscle tone. Coordination normal.  Some word finding difficulty but no slurred speech, 5+/5 strength, normal sensation, ambulates with walker, no drift, normal finger to nose finger  Skin: Skin is warm and dry. Capillary refill takes less than 2 seconds.  Psychiatric: She has a normal mood and affect.  Nursing note and vitals reviewed.    ED Treatments / Results  Labs (all labs ordered are listed, but only abnormal results are displayed) Labs Reviewed  COMPREHENSIVE METABOLIC PANEL - Abnormal; Notable for the following:       Result Value   Potassium 3.4 (*)    Glucose, Bld 151 (*)    BUN 5 (*)    ALT 12 (*)    All other components within normal limits  URINALYSIS, ROUTINE W REFLEX MICROSCOPIC - Abnormal; Notable for the following:    Color, Urine AMBER (*)    APPearance HAZY (*)    Glucose, UA 50 (*)    Ketones, ur 20 (*)    Protein, ur 100 (*)    Leukocytes, UA SMALL (*)    Bacteria, UA RARE (*)    Squamous Epithelial / LPF 0-5 (*)    All other components within normal limits  URINE CULTURE  LIPASE, BLOOD  CBC    EKG  EKG Interpretation None       Radiology Ct Head Wo Contrast  Result Date: 03/24/2017 CLINICAL DATA:  Emesis. Slurred speech. History of subdural hematoma. No reported acute injury. EXAM: CT HEAD WITHOUT CONTRAST TECHNIQUE: Contiguous axial images were obtained  from the base of the skull through the vertex without intravenous contrast. COMPARISON:  01/08/2017 head CT. FINDINGS: Brain: Stable chronic mild left cerebral convexity dural calcification deep to the craniotomy site. No evidence of parenchymal hemorrhage  or extra-axial fluid collection. No mass lesion, mass effect, or midline shift. No CT evidence of acute infarction. Stable left posterior frontal encephalomalacia. Generalized cerebral volume loss. Nonspecific mild subcortical and periventricular white matter hypodensity, most in keeping with chronic small vessel ischemic change. No ventriculomegaly. Vascular: No acute abnormality . Skull: Stable postsurgical changes from left frontal craniotomy with associated surgical plates and screws. Osteitis frontalis interna. No acute calvarial fracture. Sinuses/Orbits: Complete opacification of the frontal sinus with high density contents, unchanged. Partial opacification of the bilateral ethmoidal air cells, not appreciably changed. Mucoperiosteal thickening in the bilateral visualized maxillary sinuses with decreased opacification in the left maxillary sinus. No fluid levels. Other:  The mastoid air cells are unopacified. IMPRESSION: 1.  No evidence of acute intracranial abnormality. 2. Stable left posterior frontal encephalomalacia and chronic postsurgical changes from left frontal craniotomy . 3. Generalized cerebral volume loss and mild chronic small vessel ischemia . 4. Chronic paranasal sinusitis with complete frontal sinus opacification by high-density contents, which may indicate fungal sinusitis. Electronically Signed   By: Ilona Sorrel M.D.   On: 03/24/2017 19:44    Procedures Procedures (including critical care time)  Medications Ordered in ED Medications  ondansetron (ZOFRAN-ODT) 4 MG disintegrating tablet (not administered)  ondansetron (ZOFRAN-ODT) disintegrating tablet 4 mg (4 mg Oral Given 03/24/17 1426)  sodium chloride 0.9 % bolus 500 mL (0 mLs  Intravenous Stopped 03/24/17 1959)     Initial Impression / Assessment and Plan / ED Course  I have reviewed the triage vital signs and the nursing notes.  Pertinent labs & imaging results that were available during my care of the patient were reviewed by me and considered in my medical decision making (see chart for details).     CARIANA KARGE is an 83 year old female with history of hypertension, high cholesterol, diabetes, COPD, subdural hematomas who presents to the ED with nausea. Patient's vitals at time of arrival to the ED are unremarkable and patient is without fever. Patient with nausea for several days with intermittent emesis. Patient denies fever, chills, diarrhea, dysuria. Patient is with daughter who states that she is concerned for nausea and worried that it could be from another head bleed. She states the patient has recently been switched to Lamictal for seizures and that has coincided with this nausea and believes that it could be a possible medicine side effect. Patient did just finish some doxycycline as well which could also be causing nausea. Patient has no abdominal pain, no constipation. Patient is neurologically intact except for some word finding issues which could be chronic according to family member. Patient is overall well-appearing and in no signs of dehydration on exam. We'll get labs and head CT. We'll give patient 500 mL normal saline bolus and patient already received Zofran in triage. Doubt intra-abdominal etiology of nausea. Possible medicine side effect. We will rule out intracranial abnormality with head CT. No signs suggesting acute stroke on examination. Patient does have follow-up with neurology tomorrow to discuss Lamictal use as this also may be contributing to her symptoms. Patient has been able to tolerate by mouth intermittently throughout the week. Will also check urine.  Patient with no leukocytosis and no fever and doubt infectious process. Patient with  normal hemoglobin. Patient with unremarkable CMP with electrolytes within respective limits. No signs of acute kidney injury or dehydration on exam. Patient with urinalysis with negative nitrites and small leukocytes but likely from contamination and given no dysuria believe that there is no UTI. Will  send urine cx and family aware. Patient has normal lipase and pancreatitis. Head CT with no acute changes. Patient feel re-assured and feels better after fluids. Patient and daughter will follow up with PCP/neuro to discuss possible changes in medications that could be causing nausea feeling. Patient given rx for zofran. Patient with repeat abdominal exam that was unremarkable, doubt intra-abdominal process. Patient discharged in good condition and told to return to the ED if symptoms worsen.  Final Clinical Impressions(s) / ED Diagnoses   Final diagnoses:  Nausea    New Prescriptions New Prescriptions   ONDANSETRON (ZOFRAN) 4 MG TABLET    Take 1 tablet (4 mg total) by mouth every 8 (eight) hours as needed for nausea or vomiting.     Lennice Sites, DO 03/24/17 2026    Little, Wenda Overland, MD 03/25/17 [phone]

## 2017-03-24 NOTE — ED Notes (Signed)
Pt on way to CT

## 2017-03-24 NOTE — Telephone Encounter (Signed)
Patient daughter called regarding patient.  She says now the patient is vomiting, tremoring more and has a swollen tongue.  Advised to stop medication and go to Emergency Room.

## 2017-03-24 NOTE — ED Triage Notes (Signed)
Pt arrives to ed with c/o of emesis since Sunday. Pt denies any pain. Pt states everything she puts on her stomach she throws up.

## 2017-03-24 NOTE — Telephone Encounter (Signed)
Patient is throwing up and would like something for that called in. They were not sure if she take something for that with her being on seizure medication  The daughter took another call before I could get all of the information the call ended

## 2017-03-24 NOTE — Telephone Encounter (Signed)
I called and spoke with patient's daughter, [name].  Ms. Santiesteban is experiencing nausea, vomiting, "thick tongue" and increased hand tremor.  There was question whether it is allergy to medication, but she has had these symptoms despite change in medication.  I advised that she should be taken to the ED now for further evaluation (such as any intracranial abnormality).  [name] was in agreement and will take her.

## 2017-03-24 NOTE — Telephone Encounter (Signed)
Patient has been advised to go to emergency room for tongue swelling.

## 2017-03-25 ENCOUNTER — Ambulatory Visit (INDEPENDENT_AMBULATORY_CARE_PROVIDER_SITE_OTHER): Payer: PPO | Admitting: Neurology

## 2017-03-25 DIAGNOSIS — R569 Unspecified convulsions: Secondary | ICD-10-CM | POA: Diagnosis not present

## 2017-03-26 ENCOUNTER — Ambulatory Visit (INDEPENDENT_AMBULATORY_CARE_PROVIDER_SITE_OTHER): Payer: PPO | Admitting: Neurology

## 2017-03-26 ENCOUNTER — Encounter: Payer: Self-pay | Admitting: Neurology

## 2017-03-26 ENCOUNTER — Telehealth: Payer: Self-pay | Admitting: Neurology

## 2017-03-26 VITALS — BP 130/68 | HR 73 | Temp 97.9°F | Ht 66.0 in | Wt 199.0 lb

## 2017-03-26 DIAGNOSIS — S065XAA Traumatic subdural hemorrhage with loss of consciousness status unknown, initial encounter: Secondary | ICD-10-CM

## 2017-03-26 DIAGNOSIS — S065X9A Traumatic subdural hemorrhage with loss of consciousness of unspecified duration, initial encounter: Secondary | ICD-10-CM

## 2017-03-26 DIAGNOSIS — I6200 Nontraumatic subdural hemorrhage, unspecified: Secondary | ICD-10-CM | POA: Diagnosis not present

## 2017-03-26 LAB — URINE CULTURE: Special Requests: NORMAL

## 2017-03-26 MED ORDER — ZONISAMIDE 50 MG PO CAPS: 50.0000 mg | ORAL_CAPSULE | Freq: Every day | ORAL | 0 refills | Status: DC

## 2017-03-26 NOTE — Patient Instructions (Signed)
1.  Start zonisamide 50mg  daily (may take at bedtime). 2.  At the same time, reduce lamictal to 50mg  daily for 3 days and then stop. 3.  Contact me next Friday with update and if you are feeling well, we can increase dose  4.  Follow up on on June 4.

## 2017-03-26 NOTE — Telephone Encounter (Signed)
Spoke with patient regarding her concerns.  She is upset about the after visit summary saying contact me next Friday with update and if you are feeling well we can increase dose.  Patient does not want to be on medication the rest of her life.  Advised her to just monitor how she is feeling and contact us next week.  Reminded her that she is stopping lamictal so that's a plus.  Patient was crying at the end of the conversation.  Tried to make her understand but she was stuck on the "we can increase dose."

## 2017-03-26 NOTE — Telephone Encounter (Signed)
Has a question about what is on the AVS please call her

## 2017-03-26 NOTE — Progress Notes (Signed)
NEUROLOGY FOLLOW UP OFFICE NOTE  Carolyn [name] [id number]  HISTORY OF PRESENT ILLNESS: Carolyn [name] is an 83 year old right-handed female with hypertension, diabetes mellitus and COPD who follows up for history of isolated partial seizure secondary to subdural hematoma.  UPDATE: Due to sensation of "thick tongue" after starting Vimpat, she was transitioned to Lamictal.  An MRI of the brain was also performed on 02/10/17 to evaluate for any new stroke or recurrence of SDH, which was personally reviewed and was stable without acute changes.  During the Lamictal titration, she was exhibiting crying spells and increased dysarthria and "thick tongue".  Vimpat was tapered off.  Afterwards, she endorsed improvement in memory and right hand tremor.  However, after a couple of days, she reported worsening speech and mood as well as dizziness and nausea.   She reduced the Lamictal from 100mg  to 75mg .  Due to persistent nausea, episode of vomiting,"tongue swelling", and some word-finding difficulties she was advised to go to the ED on 03/24/17.  In ED, her exam was unremarkable except for some mild word-finding difficulties.  She was afebrile and labs revealed no leukocytosis.  UA revealed negative except for small leukocytes, which may have been contaminant.  CMP was unremarkable.  CT of head was personally reviewed and revealed no acute findings.  Nausea thought to be due to Lamictal or doxycycline (which she had just recently finished.  I reviewed the EEG from 02/21/16 and it in fact does not show any epileptiform discharges.  We repeated the EEG yesterday which revealed mild left hemispheric slowing consistent with prior SDH and subsequent craniotomy but does not reveal any epileptiform discharges.  Nausea has improved.  She and her daughter still report short-term memory problems.  She still cries easily.  Her tongue still feels thick.  She has been anxious and scared over the past few months.    HISTORY: She sustained a traumatic subdural hematoma on 01/20/16 after hitting her head from a fall when being pulled by her dog during a walk.  CT of head showed small bilateral subdural hematoma.  She was monitored overnight.  Neurosurgery assessed her and didn't feel intervention was warranted.  She was discharged and then developed headaches and slurred speech.  She returned to the ED on 02/06/16.    CT of head revealed left greater than right subdural hematoma with mass effect, requiring evacuation via left craniotomy.  She was started on Keppra 1000mg  twice daily for suspected partial seizure, presenting as transient dysphasia (slurred speech with difficulty talking).  She was also started on topiramate for headache.  In rehab, she exhibited waxing and waning mental status as well as worsening aphasia and right sided weakness.  Repeat CT of head from 02/14/16 and 02/17/16 were stable, revealing unchanged left 6 mm SHD with rightward midline shift.  Due to cognitive decline, she returned to the OR on 02/17/16 and underwent another evacuation.  Postoperatively, she continued to have worsening aphasia and rights sided weakness.  Follow up CT of head revealed reaccumulation of left SDH with mass effect, requiring another evacuation.  EEG from 02/21/16 revealed intermittent left central epileptiform discharges.  Keppra was increased to 1250mg  twice daily.  She later developed right hand tremor and Keppra was further increased to 1750mg  twice daily.  On 01/08/17, she presented to the ED for dizziness, described as spinning sensation when supine butresolves    Due to worsening mood, she was transitioned from Dewar to Vimpat.  Mood improved but she  developed "thick tongue", causing difficulty speaking.  She also endorsed dizziness consistent with positional vertigo, which was diagnosed in the ED as BPPV.  Thought to be a symptom of Keppra, she subsequently discontinued the Keppra and Vimpat was increased to 75mg  twice  daily.  The thick tongue sensation has persisted.  She also feels that she is pocketing her food in the right side of her mouth at times.  She denies new right sided weakness.   She presented to the ED on 01/08/17 for BPPV.  CT of head demonstrated stable posterior left frontal encephalomalacia and postsurgical changes from craniotomy.  PAST MEDICAL HISTORY: Past Medical History:  Diagnosis Date  . Asthma   . Cataracts, bilateral   . Chronic kidney disease 2007   kidney cancer and partial nephrctomy-right  . COPD (chronic obstructive pulmonary disease) ([place])   . Diabetes mellitus   . Gallstones   . Glaucoma   . Hypercholesterolemia   . Hypertension   . Hypothyroid   . PONV (postoperative nausea and vomiting)   . Subdural bleeding [facility])     MEDICATIONS: Current Outpatient Prescriptions on File Prior to Visit  Medication Sig Dispense Refill  . docusate sodium (COLACE) 100 MG capsule Take 100 mg by mouth daily as needed for mild constipation.    [name] glimepiride (AMARYL) 2 MG tablet Take 2 tablets (4 mg total) by mouth at bedtime. (Patient taking differently: Take 1 mg by mouth at bedtime. ) 30 tablet 1  . latanoprost (XALATAN) 0.005 % ophthalmic solution Place 1 drop into both eyes at bedtime.     [name] lisinopril (PRINIVIL,ZESTRIL) 20 MG tablet Take 1 tablet (20 mg total) by mouth at bedtime. 30 tablet 1  . ondansetron (ZOFRAN) 4 MG tablet Take 1 tablet (4 mg total) by mouth every 8 (eight) hours as needed for nausea or vomiting. 12 tablet 0  . pantoprazole (PROTONIX) 40 MG tablet Take 1 tablet (40 mg total) by mouth daily. 30 tablet 1  . theophylline (UNIPHYL) 400 MG 24 hr tablet Take 400 mg by mouth at bedtime.     [name] thyroid (ARMOUR) 60 MG tablet Take 60 mg by mouth at bedtime.     . verapamil (CALAN-SR) 180 MG CR tablet Take 180 mg by mouth at bedtime.    . metoprolol tartrate (LOPRESSOR) 25 MG tablet Take 1 tablet (25 mg total) by mouth 2 (two) times daily. (Patient not taking: Reported  on 01/08/2017) 60 tablet 1   No current facility-administered medications on file prior to visit.     ALLERGIES: Allergies  Allergen Reactions  . Darvocet [Propoxyphene N-Acetaminophen] Nausea And Vomiting  . Metformin And Related Diarrhea  . Percocet [Oxycodone-Acetaminophen] Nausea And Vomiting  . Prednisone Swelling  . Topamax [Topiramate] Other (See Comments)    Unable to speak/swallow  . Tramadol Other (See Comments)    Masked seizure symptoms  . Vimpat [Lacosamide] Swelling    tongue  . Vicodin [Hydrocodone-Acetaminophen] Nausea And Vomiting    FAMILY HISTORY: Family History  Problem Relation Age of Onset  . Hypertension Mother   . Hypertension Father   . Breast cancer Paternal Grandmother   . Diabetes Paternal Grandmother   . Hypertension Paternal Grandmother   . Stroke Paternal Grandmother   . Stroke Paternal Grandfather   . Heart attack Paternal Grandfather     SOCIAL HISTORY: Social History   Social History  . Marital status: Married    Spouse name: N/A  . Number of children: N/A  . Years  of education: N/A   Occupational History  . Not on file.   Social History Main Topics  . Smoking status: Former Smoker    Packs/day: 1.00    Years: 5.00    Types: Cigarettes    Quit date: 01/11/1982  . Smokeless tobacco: Never Used     Comment: quit maybe 36yrs  . Alcohol use No  . Drug use: No  . Sexual activity: Not on file   Other Topics Concern  . Not on file   Social History Narrative  . No narrative on file    REVIEW OF SYSTEMS: Constitutional: No fevers, chills, or sweats, no generalized fatigue, change in appetite Eyes: No visual changes, double vision, eye pain Ear, nose and throat: No hearing loss, ear pain, nasal congestion, sore throat Cardiovascular: No chest pain, palpitations Respiratory:  No shortness of breath at rest or with exertion, wheezes GastrointestinaI: some nausea Genitourinary:  No dysuria, urinary retention or  frequency Musculoskeletal:  No neck pain, back pain Integumentary: No rash, pruritus, skin lesions Neurological: as above Psychiatric: anxiety Endocrine: No palpitations, fatigue, diaphoresis, mood swings, change in appetite, change in weight, increased thirst Hematologic/Lymphatic:  No purpura, petechiae. Allergic/Immunologic: no itchy/runny eyes, nasal congestion, recent allergic reactions, rashes  PHYSICAL EXAM: Vitals:   03/26/17 0922  BP: 130/68  Pulse: 73  Temp: 97.9 F (36.6 C)   General: No acute distress.  Patient appears well-groomed.   Head:  Normocephalic/atraumatic Eyes:  Fundi examined but not visualized Neck: supple, no paraspinal tenderness, full range of motion Heart:  Regular rate and rhythm Lungs:  Clear to auscultation bilaterally Back: No paraspinal tenderness Neurological Exam: alert and oriented to person, place, and time. Attention span and concentration intact, recent and remote memory intact, fund of knowledge intact.  Speech fluent and not dysarthric, language intact.  CN II-XII intact. Bulk and tone normal, muscle strength 5/5 in upper extremities, 3+/5 lower extremities (limited due to bilateral arthritic knee pain).  Sensation to light touch intact.  Deep tendon reflexes 2+ throughout, toes downgoing.  Finger to nose  testing intact.  In wheelchair due to pain.  Needs assistance standing up.  IMPRESSION: I  History of subdural hematoma status post craniotomy. II  Questionable partial seizure at time of traumatic subdural hematoma.  She presented with transient dysphasia, however that was in the setting of worsening SDH.  It may have just been a symptom of the SDH rather than partial seizure.    III  Nausea, dizziness, slurred speech/sensation of "thick tongue".  Etiology unclear.  It may be medication side effects to Lamictal, however she has had side effects to multiple medications thus far and has previously had dizziness.  Anxiety is considered as  well.  I obtained the actual EEG from that hospitalization and after review, I can say that it does not reveal epileptiform discharges as previously stated.  Repeat EEG from yesterday does not reveal epileptiform discharges.  She has encephalomalacia, which clearly can be a focus for a potential seizure.  However, I question if she even had a seizure and both EEGs do not reveal epileptiform discharges to suggest increased risk for seizures.  Therefore, it is reasonable to keep her off of anticonvulsant medication and see how she does.  I proposed that we can taper off Lamictal and she will not be on any antiepileptic medication or we can transition to another antiepileptic medication. Due to fear of possible future seizure, she would like to start a new antiepileptic medication.  PLAN: We will start zonisamide 50mg  daily.  At the same time, she will reduce Lamictal to 50mg  daily for 3 days and then stop.  She or her daughter will contact us next Friday with update.  If she is doing well, then we can increase zonisamide to 100mg  daily.  If nausea and memory problems persist, we will discontinue zonisamide and keep her off antiepileptic medication and she should follow up with Dr. [name] to evaluate for other etiologies.  26 minutes spent face to face with patient, over 50% spent discussing findings, my assessment that she likely didn't have a seizure and risks and benefits regarding staying on or stopping antiepileptic medication.   Metta Clines, DO  CC: Lavone Orn, MD

## 2017-03-26 NOTE — Progress Notes (Signed)
ELECTROENCEPHALOGRAM REPORT  Date of Study: 03/25/2017  Patient's Name: Carolyn [name] MRN: [id number] Date of Birth: July 12, 1933  Clinical History: 83 year old female with history of subdural hematoma status post craniotomy and questionable partial seizure.  Medications: Lamictal Amaryl Lisinopril Zofran Protonix Uniphyl Armour  Verapamil  Technical Summary: A multichannel digital EEG recording measured by the international 10-20 system with electrodes applied with paste and impedances below 5000 ohms performed in our laboratory with EKG monitoring in an awake and asleep patient.  Hyperventilation was not performed.  Photic stimulation was performed.  The digital EEG was referentially recorded, reformatted, and digitally filtered in a variety of bipolar and referential montages for optimal display.    Description: The patient is awake and asleep during the recording.  During maximal wakefulness, there is a medium voltage 10-11 Hz posterior dominant rhythm that attenuates with eye opening.  There is slight delta slowing with some sharply contoured theta .  During drowsiness and sleep, there is an increase in theta slowing of the background.  Vertex waves and symmetric sleep spindles were seen.  Hyperventilation and photic stimulation did not elicit any abnormalities.  There were no epileptiform discharges or electrographic seizures seen.    EKG lead was unremarkable.  Impression: This awake and asleep EEG is abnormal due to mild left hemispheric slowing.  Clinical Correlation: The above findings are indicative of a structural or physiologic abnormality in that region, consistent with patient's history of prior subdural hematoma and subsequent craniotomy.  There are specifically no epileptiform discharges to suggest increased risk for seizures.  Clinical correlation advised.   Metta Clines, DO

## 2017-03-29 ENCOUNTER — Telehealth: Payer: Self-pay | Admitting: *Deleted

## 2017-03-29 ENCOUNTER — Other Ambulatory Visit: Payer: PPO

## 2017-03-29 NOTE — Telephone Encounter (Signed)
[name] (patient's daughter) called stating that her mom is very nauseated, has been having mood swings and is slurring her words.  Is this from coming off of the lamictal?  She doesn't think anything bad is going on but is wondering if this is a side effect.  Please advise.

## 2017-03-29 NOTE — Telephone Encounter (Signed)
It's because of these symptoms that we are stopping Lamictal.  Her last dose of Lamictal should be today, so we will see how she does.  I had asked that they contact us on Friday with an update.

## 2017-03-29 NOTE — Telephone Encounter (Signed)
I gave Carolyn [name] the information per Dr. Tomi Likens and she will update [place] at the end of the week.

## 2017-04-02 ENCOUNTER — Emergency Department (HOSPITAL_COMMUNITY): Payer: PPO

## 2017-04-02 ENCOUNTER — Telehealth: Payer: Self-pay | Admitting: Neurology

## 2017-04-02 ENCOUNTER — Emergency Department (HOSPITAL_COMMUNITY)
Admission: EM | Admit: 2017-04-02 | Discharge: 2017-04-02 | Disposition: A | Discharge status: Home / Self Care | Payer: PPO | Attending: Emergency Medicine | Admitting: Emergency Medicine

## 2017-04-02 DIAGNOSIS — J449 Chronic obstructive pulmonary disease, unspecified: Secondary | ICD-10-CM | POA: Diagnosis not present

## 2017-04-02 DIAGNOSIS — Z79899 Other long term (current) drug therapy: Secondary | ICD-10-CM | POA: Insufficient documentation

## 2017-04-02 DIAGNOSIS — I129 Hypertensive chronic kidney disease with stage 1 through stage 4 chronic kidney disease, or unspecified chronic kidney disease: Secondary | ICD-10-CM | POA: Insufficient documentation

## 2017-04-02 DIAGNOSIS — Z8679 Personal history of other diseases of the circulatory system: Secondary | ICD-10-CM | POA: Diagnosis not present

## 2017-04-02 DIAGNOSIS — N189 Chronic kidney disease, unspecified: Secondary | ICD-10-CM | POA: Insufficient documentation

## 2017-04-02 DIAGNOSIS — E1122 Type 2 diabetes mellitus with diabetic chronic kidney disease: Secondary | ICD-10-CM | POA: Diagnosis not present

## 2017-04-02 DIAGNOSIS — R1114 Bilious vomiting: Secondary | ICD-10-CM

## 2017-04-02 DIAGNOSIS — I10 Essential (primary) hypertension: Secondary | ICD-10-CM | POA: Diagnosis not present

## 2017-04-02 DIAGNOSIS — E119 Type 2 diabetes mellitus without complications: Secondary | ICD-10-CM | POA: Diagnosis not present

## 2017-04-02 DIAGNOSIS — R112 Nausea with vomiting, unspecified: Secondary | ICD-10-CM | POA: Diagnosis not present

## 2017-04-02 DIAGNOSIS — R110 Nausea: Secondary | ICD-10-CM | POA: Diagnosis not present

## 2017-04-02 DIAGNOSIS — K5900 Constipation, unspecified: Secondary | ICD-10-CM | POA: Diagnosis not present

## 2017-04-02 DIAGNOSIS — E039 Hypothyroidism, unspecified: Secondary | ICD-10-CM | POA: Insufficient documentation

## 2017-04-02 DIAGNOSIS — E1140 Type 2 diabetes mellitus with diabetic neuropathy, unspecified: Secondary | ICD-10-CM | POA: Diagnosis not present

## 2017-04-02 DIAGNOSIS — K5901 Slow transit constipation: Secondary | ICD-10-CM | POA: Diagnosis not present

## 2017-04-02 DIAGNOSIS — R197 Diarrhea, unspecified: Secondary | ICD-10-CM | POA: Diagnosis not present

## 2017-04-02 DIAGNOSIS — Z87891 Personal history of nicotine dependence: Secondary | ICD-10-CM | POA: Insufficient documentation

## 2017-04-02 DIAGNOSIS — Z7984 Long term (current) use of oral hypoglycemic drugs: Secondary | ICD-10-CM | POA: Diagnosis not present

## 2017-04-02 LAB — COMPREHENSIVE METABOLIC PANEL
ALT: 11 U/L — ABNORMAL LOW (ref 14–54)
AST: 18 U/L (ref 15–41)
Albumin: 4.1 g/dL (ref 3.5–5.0)
Alkaline Phosphatase: 85 U/L (ref 38–126)
Anion gap: 7 (ref 5–15)
BUN: 10 mg/dL (ref 6–20)
CO2: 25 mmol/L (ref 22–32)
Calcium: 10.2 mg/dL (ref 8.9–10.3)
Chloride: 106 mmol/L (ref 101–111)
Creatinine, Ser: 0.8 mg/dL (ref 0.44–1.00)
GFR calc Af Amer: >60 mL/min (ref >60)
GFR calc non Af Amer: >60 mL/min (ref >60)
Glucose, Bld: 148 mg/dL — ABNORMAL HIGH (ref 65–99)
Potassium: 3.6 mmol/L (ref 3.5–5.1)
Sodium: 138 mmol/L (ref 135–145)
Total Bilirubin: 1 mg/dL (ref 0.3–1.2)
Total Protein: 7.1 g/dL (ref 6.5–8.1)

## 2017-04-02 LAB — CBC
HCT: 46.4 % — ABNORMAL HIGH (ref 36.0–46.0)
Hemoglobin: 15.4 g/dL — ABNORMAL HIGH (ref 12.0–15.0)
MCH: 29.3 pg (ref 26.0–34.0)
MCHC: 33.2 g/dL (ref 30.0–36.0)
MCV: 88.2 fL (ref 78.0–100.0)
Platelets: 178 10*3/uL (ref 150–400)
RBC: 5.26 MIL/uL — ABNORMAL HIGH (ref 3.87–5.11)
RDW: 13.7 % (ref 11.5–15.5)
WBC: 9.1 10*3/uL (ref 4.0–10.5)

## 2017-04-02 LAB — LIPASE, BLOOD: Lipase: 19 U/L (ref 11–51)

## 2017-04-02 MED ORDER — ONDANSETRON HCL 4 MG/2ML IJ SOLN
4.0000 mg | Freq: Once | INTRAMUSCULAR | Status: AC
  Administered 2017-04-02: 4 mg via INTRAVENOUS
  Filled 2017-04-02: qty 2

## 2017-04-02 MED ORDER — SENNOSIDES-DOCUSATE SODIUM 8.6-50 MG PO TABS: 2.0000 | ORAL_TABLET | Freq: Every day | ORAL | 0 refills | Status: AC

## 2017-04-02 MED ORDER — DOCUSATE SODIUM 283 MG RE ENEM: 1.0000 | ENEMA | Freq: Every day | RECTAL | 0 refills | Status: AC

## 2017-04-02 MED ORDER — SODIUM CHLORIDE 0.9 % IV BOLUS (SEPSIS)
1000.0000 mL | Freq: Once | INTRAVENOUS | Status: AC
  Administered 2017-04-02: 1000 mL via INTRAVENOUS

## 2017-04-02 NOTE — ED Provider Notes (Signed)
Carolyn [name]   CSN: [id number] Arrival date & time: 04/02/17  1201     History   Chief Complaint Chief Complaint  Patient presents with  . Nausea  . Emesis    HPI Carolyn [name] is a 83 y.o. female.  HPI  Patient presents with concern of nausea, anorexia, decreased bowel movements there Patient has a notable history of subdural hematoma last month, as well as complicated recovery. Patient had craniotomy, and subsequently required initiation of antiepileptics. During the past few weeks the patient has been on multiple different epileptics, with titration, each with different adverse effects. Medications include Vimpat, Keppra. Patient denies new seizure activity Today she went to UC and was sent here for eval.    Past Medical History:  Diagnosis Date  . Asthma   . Cataracts, bilateral   . Chronic kidney disease 2007   kidney cancer and partial nephrctomy-right  . COPD (chronic obstructive pulmonary disease) ([place])   . Diabetes mellitus   . Gallstones   . Glaucoma   . Hypercholesterolemia   . Hypertension   . Hypothyroid   . PONV (postoperative nausea and vomiting)   . Subdural bleeding [facility])     Patient Active Problem List   Diagnosis Date Noted  . Subdural hematoma ([place]) 03/23/2016  . Bradycardia 03/23/2016  . Late effect of head trauma, cognitive deficits   . Dysphagia   . Seizures ([place])   . Acute lower UTI   . Altered mental status   . Atelectasis   . Spastic hemiplegia affecting right side 02/19/2016  . S/P craniotomy 02/17/2016  . Traumatic subdural hematoma ([place]) 02/12/2016  . Headache as late effect of brain injury ([place])   . Seizure ([place])   . Difficulty controlling behavior as late effect of traumatic brain injury ([place])   . 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 Memory dysfunction as late effect of traumatic brain injury ([place])   . Benign essential HTN   .  DM type 2 with diabetic peripheral neuropathy ([place])   . Thyroid activity decreased   . TBI (traumatic brain injury) ([place])   . Cognitive deficit as late effect of traumatic brain injury ([place])   . Falls frequently   . Controlled type 2 diabetes mellitus with complication, without long-term current use of insulin ([place])   . Tachycardia   . Chronic obstructive pulmonary disease ([place])   . Chronic bronchitis ([place])   . SDH (subdural hematoma) ([place]) 01/20/2016  . Fall 01/20/2016  . Closed fracture of left elbow 01/20/2016  . Diabetes mellitus without complication ([place])   . Hypercholesterolemia   . COPD (chronic obstructive pulmonary disease) ([place])   . Hypothyroid   . Essential hypertension   . Inguinal hernia unilateral, non-recurrent, left 12/02/2011    Past Surgical History:  Procedure Laterality Date  . ABDOMINAL HYSTERECTOMY    . BREAST BIOPSY    . Cateract extractions and IOL    . CHOLECYSTECTOMY  2008  . CRANIOTOMY Left 02/06/2016   Procedure: CRANIOTOMY HEMATOMA EVACUATION SUBDURAL;  Surgeon: Kevan Ny Ditty, MD;  Location: [place] [facility];  Service: Neurosurgery;  Laterality: Left;  . CRANIOTOMY N/A 02/17/2016   Procedure: CRANIOTOMY HEMATOMA EVACUATION SUBDURAL;  Surgeon: Kevan Ny Ditty, MD;  Location: [facility];  Service: Neurosurgery;  Laterality: N/A;  left  . CRANIOTOMY Left 02/18/2016   Procedure: CRANIOTOMY HEMATOMA EVACUATION SUBDURAL;  Surgeon: Earnie Larsson, MD;  Location: [place]  ORS;  Service: Neurosurgery;  Laterality: Left;  . D&C for enlarged uterine  210  . DILATION AND CURETTAGE OF UTERUS    . EYE SURGERY  08/2011,09/2012   bilateral cataracts  . HERNIA REPAIR  01/19/12   inguinal  . INGUINAL HERNIA REPAIR  01/19/2012   Procedure: HERNIA REPAIR INGUINAL ADULT;  Surgeon: Earnstine Regal, MD;  Location: [facility] ORS;  Service: General;  Laterality: Left;  repair left inguinal hernia with mesh   . PARTIAL NEPHRECTOMY     cancer  . TOTAL ABDOMINAL HYSTERECTOMY W/  BILATERAL SALPINGOOPHORECTOMY  2011    OB History    No data available       Home Medications    Prior to Admission medications   Medication Sig Start Date End Date Taking? Authorizing Provider  docusate sodium (COLACE) 100 MG capsule Take 100 mg by mouth daily as needed for mild constipation.    [provider]  glimepiride (AMARYL) 2 MG tablet Take 2 tablets (4 mg total) by mouth at bedtime. Patient taking differently: Take 1 mg by mouth at bedtime.  03/06/16   Angiulli, Lavon Paganini, PA-C  latanoprost (XALATAN) 0.005 % ophthalmic solution Place 1 drop into both eyes at bedtime.     [provider]  lisinopril (PRINIVIL,ZESTRIL) 20 MG tablet Take 1 tablet (20 mg total) by mouth at bedtime. 03/06/16   Angiulli, Lavon Paganini, PA-C  metoprolol tartrate (LOPRESSOR) 25 MG tablet Take 1 tablet (25 mg total) by mouth 2 (two) times daily. Patient not taking: Reported on 01/08/2017 03/06/16   Angiulli, Lavon Paganini, PA-C  ondansetron (ZOFRAN) 4 MG tablet Take 1 tablet (4 mg total) by mouth every 8 (eight) hours as needed for nausea or vomiting. 03/24/17   Curatolo, Adam, DO  pantoprazole (PROTONIX) 40 MG tablet Take 1 tablet (40 mg total) by mouth daily. 03/06/16   Angiulli, Lavon Paganini, PA-C  theophylline (UNIPHYL) 400 MG 24 hr tablet Take 400 mg by mouth at bedtime.     [provider]  thyroid (ARMOUR) 60 MG tablet Take 60 mg by mouth at bedtime.     [provider]  verapamil (CALAN-SR) 180 MG CR tablet Take 180 mg by mouth at bedtime.    [provider]  zonisamide (ZONEGRAN) 50 MG capsule Take 1 capsule (50 mg total) by mouth daily. 03/26/17   Pieter Partridge, DO    Family History Family History  Problem Relation Age of Onset  . Hypertension Mother   . Hypertension Father   . Breast cancer Paternal Grandmother   . Diabetes Paternal Grandmother   . Hypertension Paternal Grandmother   . Stroke Paternal Grandmother   . Stroke Paternal Grandfather   . Heart attack  Paternal Grandfather     Social History Social History  Substance Use Topics  . Smoking status: Former Smoker    Packs/day: 1.00    Years: 5.00    Types: Cigarettes    Quit date: 01/11/1982  . Smokeless tobacco: Never Used     Comment: quit maybe 45yrs  . Alcohol use No     Allergies   Darvocet [propoxyphene n-acetaminophen]; Metformin and related; Percocet [oxycodone-acetaminophen]; Prednisone; Topamax [topiramate]; Tramadol; Vimpat [lacosamide]; and Vicodin [hydrocodone-acetaminophen]   Review of Systems Review of Systems  Constitutional:       Per HPI, otherwise negative  HENT:       Per HPI, otherwise negative  Respiratory:       Per HPI, otherwise negative  Cardiovascular:  Per HPI, otherwise negative  Gastrointestinal: Positive for nausea and vomiting. Negative for abdominal pain.  Endocrine:       Negative aside from HPI  Genitourinary:       Neg aside from HPI   Musculoskeletal:       Per HPI, otherwise negative  Skin: Negative.  Negative for wound.  Neurological: Positive for weakness. Negative for syncope.  Hematological:       Recent subdural hematoma  Psychiatric/Behavioral:       Behavioral changes, per history of present illness     Physical Exam Updated Vital Signs BP (!) 160/87 (BP Location: Right Arm)   Pulse 70   Temp 98.7 F (37.1 C) (Oral)   Resp 16   Ht 5\' 6"  (1.676 m)   Wt 199 lb (90.3 kg)   LMP 03/24/2010   SpO2 96%   BMI 32.12 kg/m   Physical Exam  Constitutional: She is oriented to person, place, and time. She has a sickly appearance. No distress.  HENT:  Head: Normocephalic and atraumatic.  Eyes: Conjunctivae and EOM are normal.  Cardiovascular: Normal rate and regular rhythm.   Pulmonary/Chest: Effort normal and breath sounds normal. No stridor. No respiratory distress.  Abdominal: She exhibits no distension. There is no tenderness. There is no rigidity, no guarding, no tenderness at McBurney's point and negative  Murphy's sign.  Musculoskeletal: She exhibits no edema.  Neurological: She is alert and oriented to person, place, and time. She displays atrophy. She displays no tremor. No cranial nerve deficit. She exhibits normal muscle tone. She displays no seizure activity.  Skin: Skin is warm and dry.  Psychiatric: She has a normal mood and affect.  Nursing [name] and vitals reviewed.    ED Treatments / Results  Labs (all labs ordered are listed, but only abnormal results are displayed) Labs Reviewed  COMPREHENSIVE METABOLIC PANEL - Abnormal; Notable for the following:       Result Value   Glucose, Bld 148 (*)    ALT 11 (*)    All other components within normal limits  CBC - Abnormal; Notable for the following:    RBC 5.26 (*)    Hemoglobin 15.4 (*)    HCT 46.4 (*)    All other components within normal limits  LIPASE, BLOOD  URINALYSIS, ROUTINE W REFLEX MICROSCOPIC    Radiology Dg Abd Acute W/chest  Result Date: 04/02/2017 CLINICAL DATA:  Nausea and diarrhea and vomiting EXAM: DG ABDOMEN ACUTE W/ 1V CHEST COMPARISON:  02/21/2016, 02/20/2016 FINDINGS: Single-view chest demonstrates no acute consolidation or effusion. Stable cardiomediastinal silhouette with atherosclerosis. Supine and upright views of the abdomen demonstrate no free air beneath the diaphragm. Surgical clips in the right upper quadrant. Nonobstructed bowel-gas pattern with moderate stool throughout the colon. Small calcified pelvic phleboliths. Advanced arthritis of the left hip. IMPRESSION: 1. No radiographic evidence for acute cardiopulmonary abnormality 2. Nonobstructed bowel-gas pattern with moderate to large stool throughout the colon Electronically Signed   By: Donavan Foil M.D.   On: 04/02/2017 14:23    Procedures Procedures (including critical care time)  Medications Ordered in ED Medications  sodium chloride 0.9 % bolus 1,000 mL (1,000 mLs Intravenous New Bag/Given 04/02/17 1445)     Initial Impression /  Assessment and Plan / ED Course  I have reviewed the triage vital signs and the nursing notes.  Pertinent labs & imaging results that were available during my care of the patient were reviewed by me and considered in my medical  decision making (see chart for details).  3:15 PM Patient's daughter has arrived. Together we discussed all findings including reassuring labs, x-ray suggesting constipation. She confirms that the patient has been using Marlex only intermittently, has had no sustained enema, or other bowel regimen. We discussed the necessity of adhering to a regular bowel regimen for the coming days to assist with constipation resolution, improve her nausea, vomiting. With a soft, non-peritoneal abdomen, no x-ray evidence of outlet obstruction, no lab abnormalities that are substantial, unremarkable vitals, patient discharged in stable condition.  Final Clinical Impressions(s) / ED Diagnoses  Nausea and vomiting Constipation   Carmin Muskrat, MD 04/02/17 [phone]

## 2017-04-02 NOTE — Discharge Instructions (Signed)
As discussed, it is important that you monitor your condition carefully, and take all medication as directed. If you develop new, or concerning changes in your condition, please return here. Otherwise, please be sure to follow-up with your physician.

## 2017-04-02 NOTE — Telephone Encounter (Signed)
Pt'S daughter called to let Dr Tomi Likens know that she has been discharged from the hospital and that she was  Constipated and that was the reason for her nausea, she is still having problems with her speech and memory

## 2017-04-02 NOTE — ED Triage Notes (Addendum)
Per Pt and family, Pt recently changed seizure medication on Sunday. Pt has been dealing with nausea, diarrhea, and vomiting x 1 month. Pt had a craniotomy completed in March 2017 after a fall - They put her on Keppra first and created bad moods and speech changes. Pt was weaned off of Keppra and changed to Vimpat. Vimpat caused worsening speech changes. After seeing neurology, they placed her on Lamictal with worsening speech changes and short term memory change. Hx of Stroke. Pt on Sunday was changed medication. EEG on Thursday was negative. Pt came in today because nausea, vomiting, and constipation has continued. Pt denies any new neuro deficits. Family reports that she had CT Wednesday with no acute findings. PCP sent here for evaluation. Family reports patient is at her baseline speech and memory.   Daughter had to leave, Neuro Exam noted to be WNL besides slurred speech. Pt alert and oriented x4.

## 2017-04-02 NOTE — Telephone Encounter (Signed)
See ER note

## 2017-04-02 NOTE — Telephone Encounter (Signed)
Patient's daughter made aware.    Please call daughter to schedule neurocognitive testing.

## 2017-04-02 NOTE — Telephone Encounter (Signed)
Then I would leave zonisamide at current dose.  We can refer her to Dr. Si Raider for neuropsychological testing in order to evaluate her memory.

## 2017-04-02 NOTE — Telephone Encounter (Signed)
Patient daughter called and states that she is not better  She is still throwing up and can t have a BM  And the PCP sent her to the ER. She is still having trouble  with speech  And short term memory is awful please call daughter back today she would like to talk to someone

## 2017-04-05 ENCOUNTER — Emergency Department (HOSPITAL_COMMUNITY): Payer: PPO

## 2017-04-05 ENCOUNTER — Encounter (HOSPITAL_COMMUNITY): Payer: Self-pay | Admitting: Emergency Medicine

## 2017-04-05 ENCOUNTER — Emergency Department (HOSPITAL_COMMUNITY)
Admission: EM | Admit: 2017-04-05 | Discharge: 2017-04-05 | Disposition: A | Discharge status: Home / Self Care | Payer: PPO | Attending: Emergency Medicine | Admitting: Emergency Medicine

## 2017-04-05 DIAGNOSIS — Z87891 Personal history of nicotine dependence: Secondary | ICD-10-CM | POA: Diagnosis not present

## 2017-04-05 DIAGNOSIS — N189 Chronic kidney disease, unspecified: Secondary | ICD-10-CM | POA: Diagnosis not present

## 2017-04-05 DIAGNOSIS — Z79899 Other long term (current) drug therapy: Secondary | ICD-10-CM | POA: Insufficient documentation

## 2017-04-05 DIAGNOSIS — E039 Hypothyroidism, unspecified: Secondary | ICD-10-CM | POA: Diagnosis not present

## 2017-04-05 DIAGNOSIS — K5900 Constipation, unspecified: Secondary | ICD-10-CM | POA: Diagnosis not present

## 2017-04-05 DIAGNOSIS — J449 Chronic obstructive pulmonary disease, unspecified: Secondary | ICD-10-CM | POA: Insufficient documentation

## 2017-04-05 DIAGNOSIS — R1110 Vomiting, unspecified: Secondary | ICD-10-CM | POA: Diagnosis not present

## 2017-04-05 DIAGNOSIS — K5909 Other constipation: Secondary | ICD-10-CM | POA: Diagnosis not present

## 2017-04-05 DIAGNOSIS — E1122 Type 2 diabetes mellitus with diabetic chronic kidney disease: Secondary | ICD-10-CM | POA: Insufficient documentation

## 2017-04-05 DIAGNOSIS — E1140 Type 2 diabetes mellitus with diabetic neuropathy, unspecified: Secondary | ICD-10-CM | POA: Insufficient documentation

## 2017-04-05 LAB — URINALYSIS, ROUTINE W REFLEX MICROSCOPIC
Bacteria, UA: NONE SEEN
Bilirubin Urine: NEGATIVE
Glucose, UA: NEGATIVE mg/dL
Hgb urine dipstick: NEGATIVE
Ketones, ur: 5 mg/dL — AB
Nitrite: NEGATIVE
Protein, ur: NEGATIVE mg/dL
Specific Gravity, Urine: 1.011 (ref 1.005–1.030)
pH: 7 (ref 5.0–8.0)

## 2017-04-05 LAB — CBC
HCT: 43.1 % (ref 36.0–46.0)
Hemoglobin: 14.7 g/dL (ref 12.0–15.0)
MCH: 29.2 pg (ref 26.0–34.0)
MCHC: 34.1 g/dL (ref 30.0–36.0)
MCV: 85.5 fL (ref 78.0–100.0)
Platelets: 196 10*3/uL (ref 150–400)
RBC: 5.04 MIL/uL (ref 3.87–5.11)
RDW: 13.7 % (ref 11.5–15.5)
WBC: 10.1 10*3/uL (ref 4.0–10.5)

## 2017-04-05 LAB — COMPREHENSIVE METABOLIC PANEL
ALT: 10 U/L — ABNORMAL LOW (ref 14–54)
AST: 15 U/L (ref 15–41)
Albumin: 3.9 g/dL (ref 3.5–5.0)
Alkaline Phosphatase: 79 U/L (ref 38–126)
Anion gap: 8 (ref 5–15)
BUN: 7 mg/dL (ref 6–20)
CO2: 23 mmol/L (ref 22–32)
Calcium: 10 mg/dL (ref 8.9–10.3)
Chloride: 108 mmol/L (ref 101–111)
Creatinine, Ser: 0.7 mg/dL (ref 0.44–1.00)
GFR calc Af Amer: >60 mL/min (ref >60)
GFR calc non Af Amer: >60 mL/min (ref >60)
Glucose, Bld: 168 mg/dL — ABNORMAL HIGH (ref 65–99)
Potassium: 3.2 mmol/L — ABNORMAL LOW (ref 3.5–5.1)
Sodium: 139 mmol/L (ref 135–145)
Total Bilirubin: 0.9 mg/dL (ref 0.3–1.2)
Total Protein: 6.9 g/dL (ref 6.5–8.1)

## 2017-04-05 LAB — LIPASE, BLOOD: Lipase: 18 U/L (ref 11–51)

## 2017-04-05 MED ORDER — SODIUM CHLORIDE 0.9 % IV BOLUS (SEPSIS)
1000.0000 mL | Freq: Once | INTRAVENOUS | Status: AC
  Administered 2017-04-05: 1000 mL via INTRAVENOUS

## 2017-04-05 MED ORDER — ONDANSETRON 4 MG PO TBDP
ORAL_TABLET | ORAL | Status: AC
  Administered 2017-04-05: 4 mg
  Filled 2017-04-05: qty 1

## 2017-04-05 MED ORDER — ONDANSETRON 4 MG PO TBDP: 4.0000 mg | ORAL_TABLET | Freq: Three times a day (TID) | ORAL | 0 refills | Status: AC | PRN

## 2017-04-05 MED ORDER — IOPAMIDOL (ISOVUE-300) INJECTION 61%
INTRAVENOUS | Status: AC
  Administered 2017-04-05: 100 mL
  Filled 2017-04-05: qty 100

## 2017-04-05 MED ORDER — POTASSIUM CHLORIDE CRYS ER 20 MEQ PO TBCR
40.0000 meq | EXTENDED_RELEASE_TABLET | Freq: Once | ORAL | Status: AC
  Administered 2017-04-05: 40 meq via ORAL
  Filled 2017-04-05: qty 2

## 2017-04-05 NOTE — Care Management (Signed)
ED CM received consult from [facility] ED Tech concerning patient possibly needing assistance at home. She states, that patient told her that she is not getting help at home, she lives at home with husband but he does not assist her with meal or toiletting. CM met with patient at bedside, patient reports living at home with husband and having the support of her children who assist with care throughout the week.  Discussed [facility] services recommendation., she vehemently declined. Siting she has had HH in the past and, she believed that they were steeling from her. CM  Updated RN on Pod E. and Marisue Brooklyn PA- C.

## 2017-04-05 NOTE — ED Notes (Signed)
Pt provided with crackers, ice water and sprite.

## 2017-04-05 NOTE — ED Notes (Signed)
Patient transported to CT 

## 2017-04-05 NOTE — ED Notes (Signed)
Pt stated that she doesn't get much help at home. Pt stated that her husband gets up and gets himself something to eat, but doesn't assist her much. Pt stated that husband goes to bed at 5pm and wakes up at 9am. Pt stated that she was having a hard time getting around at home. Informed Cindy - RN and Mariann Laster - Data processing manager.

## 2017-04-05 NOTE — ED Provider Notes (Signed)
[facility] DEPT Provider Note   CSN: [id number] Arrival date & time: 04/05/17  1153     History   Chief Complaint Chief Complaint  Patient presents with  . Constipation  . Emesis    HPI Carolyn [name] is a 83 y.o. female.  HPI   Carolyn [name] is a 83 y.o. female, with a history of asthma, subdural hemorrhage, COPD, DM, cholecystectomy, hysterectomy, HTN, presenting to the ED with constipation and vomiting. Endorses nausea and vomiting for the last two days.  States she had a small bowel movement two days ago. States previous BM was a week prior to that. Occasional flatulence. Patient has been using fleets enemas with last one yesterday.  Denies fever/chills, SOB, hematochezia/melena, diarrhea, vomiting fecal matter, or any other complaints.     Past Medical History:  Diagnosis Date  . Asthma   . Cataracts, bilateral   . Chronic kidney disease 2007   kidney cancer and partial nephrctomy-right  . COPD (chronic obstructive pulmonary disease) ([place])   . Diabetes mellitus   . Gallstones   . Glaucoma   . Hypercholesterolemia   . Hypertension   . Hypothyroid   . PONV (postoperative nausea and vomiting)   . Subdural bleeding [facility])     Patient Active Problem List   Diagnosis Date Noted  . Subdural hematoma ([place]) 03/23/2016  . Bradycardia 03/23/2016  . Late effect of head trauma, cognitive deficits   . Dysphagia   . Seizures ([place])   . Acute lower UTI   . Altered mental status   . Atelectasis   . Spastic hemiplegia affecting right side 02/19/2016  . S/P craniotomy 02/17/2016  . Traumatic subdural hematoma ([place]) 02/12/2016  . Headache as late effect of brain injury ([place])   . Seizure ([place])   . Difficulty controlling behavior as late effect of traumatic brain injury ([place])   . 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 Memory dysfunction as late effect of traumatic brain injury ([place])   . Benign  essential HTN   . DM type 2 with diabetic peripheral neuropathy ([place])   . Thyroid activity decreased   . TBI (traumatic brain injury) ([place])   . Cognitive deficit as late effect of traumatic brain injury ([place])   . Falls frequently   . Controlled type 2 diabetes mellitus with complication, without long-term current use of insulin ([place])   . Tachycardia   . Chronic obstructive pulmonary disease ([place])   . Chronic bronchitis ([place])   . SDH (subdural hematoma) ([place]) 01/20/2016  . Fall 01/20/2016  . Closed fracture of left elbow 01/20/2016  . Diabetes mellitus without complication ([place])   . Hypercholesterolemia   . COPD (chronic obstructive pulmonary disease) ([place])   . Hypothyroid   . Essential hypertension   . Inguinal hernia unilateral, non-recurrent, left 12/02/2011    Past Surgical History:  Procedure Laterality Date  . ABDOMINAL HYSTERECTOMY    . BREAST BIOPSY    . Cateract extractions and IOL    . CHOLECYSTECTOMY  2008  . CRANIOTOMY Left 02/06/2016   Procedure: CRANIOTOMY HEMATOMA EVACUATION SUBDURAL;  Surgeon: Kevan Ny Ditty, MD;  Location: [facility] NEURO ORS;  Service: Neurosurgery;  Laterality: Left;  . CRANIOTOMY N/A 02/17/2016   Procedure: CRANIOTOMY HEMATOMA EVACUATION SUBDURAL;  Surgeon: Kevan Ny Ditty, MD;  Location: [facility] NEURO ORS;  Service: Neurosurgery;  Laterality: N/A;  left  . CRANIOTOMY Left 02/18/2016   Procedure: CRANIOTOMY HEMATOMA EVACUATION SUBDURAL;  Surgeon: Earnie Larsson, MD;  Location: [facility] NEURO ORS;  Service: Neurosurgery;  Laterality: Left;  . D&C for enlarged uterine  210  . DILATION AND CURETTAGE OF UTERUS    . EYE SURGERY  08/2011,09/2012   bilateral cataracts  . HERNIA REPAIR  01/19/12   inguinal  . INGUINAL HERNIA REPAIR  01/19/2012   Procedure: HERNIA REPAIR INGUINAL ADULT;  Surgeon: Earnstine Regal, MD;  Location: [facility] ORS;  Service: General;  Laterality: Left;  repair left inguinal hernia with mesh   . PARTIAL NEPHRECTOMY     cancer  . TOTAL ABDOMINAL  HYSTERECTOMY W/ BILATERAL SALPINGOOPHORECTOMY  2011    OB History    No data available       Home Medications    Prior to Admission medications   Medication Sig Start Date End Date Taking? Authorizing Provider  acetaminophen (TYLENOL) 500 MG tablet Take 500 mg by mouth every 6 (six) hours as needed for headache (pain).   Yes [provider]  albuterol (PROVENTIL HFA;VENTOLIN HFA) 108 (90 Base) MCG/ACT inhaler Inhale 1 puff into the lungs every 6 (six) hours as needed for wheezing or shortness of breath.   Yes [provider]  docusate sodium (ENEMEEZ) 283 MG enema Place 1 enema (283 mg total) rectally daily. 04/02/17  Yes Carmin Muskrat, MD  Fluticasone-Salmeterol (ADVAIR) 250-50 MCG/DOSE AEPB Inhale 1 puff into the lungs 2 (two) times daily as needed (shortness of breath/ chest tightness).   Yes [provider]  glimepiride (AMARYL) 1 MG tablet Take 1 mg by mouth at bedtime.   Yes [provider]  latanoprost (XALATAN) 0.005 % ophthalmic solution Place 1 drop into both eyes at bedtime.    Yes [provider]  lisinopril (PRINIVIL,ZESTRIL) 20 MG tablet Take 1 tablet (20 mg total) by mouth at bedtime. 03/06/16  Yes Angiulli, Lavon Paganini, PA-C  ondansetron (ZOFRAN) 4 MG tablet Take 1 tablet (4 mg total) by mouth every 8 (eight) hours as needed for nausea or vomiting. 03/24/17  Yes Curatolo, Adam, DO  polyethylene glycol (MIRALAX / GLYCOLAX) packet Take 17 g by mouth daily as needed (constipation). Mix in 8 oz liquid and drink   Yes [provider]  senna-docusate (SENOKOT-S) 8.6-50 MG tablet Take 2 tablets by mouth daily. 04/02/17 04/12/17 Yes Carmin Muskrat, MD  theophylline (UNIPHYL) 400 MG 24 hr tablet Take 400 mg by mouth at bedtime.    Yes [provider]  thyroid (ARMOUR) 60 MG tablet Take 60 mg by mouth at bedtime.    Yes [provider]  verapamil (CALAN-SR) 180 MG CR tablet Take 180 mg by mouth at bedtime.   Yes  [provider]  zonisamide (ZONEGRAN) 50 MG capsule Take 1 capsule (50 mg total) by mouth daily. Patient taking differently: Take 50 mg by mouth at bedtime.  03/26/17  Yes Jaffe, Adam R, DO  glimepiride (AMARYL) 2 MG tablet Take 2 tablets (4 mg total) by mouth at bedtime. Patient not taking: Reported on 04/05/2017 03/06/16   Angiulli, Lavon Paganini, PA-C  metoprolol tartrate (LOPRESSOR) 25 MG tablet Take 1 tablet (25 mg total) by mouth 2 (two) times daily. Patient not taking: Reported on 04/05/2017 03/06/16   Angiulli, Lavon Paganini, PA-C  ondansetron (ZOFRAN ODT) 4 MG disintegrating tablet Take 1 tablet (4 mg total) by mouth every 8 (eight) hours as needed for nausea or vomiting. 04/05/17   ,  C, PA-C  pantoprazole (PROTONIX) 40 MG tablet Take 1 tablet (40 mg total) by  mouth daily. Patient not taking: Reported on 04/05/2017 03/06/16   Angiulli, Lavon Paganini, PA-C    Family History Family History  Problem Relation Age of Onset  . Hypertension Mother   . Hypertension Father   . Breast cancer Paternal Grandmother   . Diabetes Paternal Grandmother   . Hypertension Paternal Grandmother   . Stroke Paternal Grandmother   . Stroke Paternal Grandfather   . Heart attack Paternal Grandfather     Social History Social History  Substance Use Topics  . Smoking status: Former Smoker    Packs/day: 1.00    Years: 5.00    Types: Cigarettes    Quit date: 01/11/1982  . Smokeless tobacco: Never Used     Comment: quit maybe 42yr  . Alcohol use No     Allergies   Darvocet [propoxyphene n-acetaminophen]; Metformin and related; Percocet [oxycodone-acetaminophen]; Prednisone; Topamax [topiramate]; Tramadol; Vimpat [lacosamide]; and Vicodin [hydrocodone-acetaminophen]   Review of Systems Review of Systems  Constitutional: Negative for chills, diaphoresis and fever.  Respiratory: Negative for shortness of breath.   Gastrointestinal: Positive for constipation, nausea and vomiting.  All other systems  reviewed and are negative.    Physical Exam Updated Vital Signs BP (!) 143/75 (BP Location: Right Arm)   Pulse 72   Temp 98.5 F (36.9 C) (Oral)   Resp 18   LMP 03/24/2010   SpO2 97%   Physical Exam  Constitutional: She appears well-developed and well-nourished. No distress.  HENT:  Head: Normocephalic and atraumatic.  Eyes: Conjunctivae are normal.  Neck: Neck supple.  Cardiovascular: Normal rate, regular rhythm, normal heart sounds and intact distal pulses.   Pulmonary/Chest: Effort normal and breath sounds normal. No respiratory distress.  Abdominal: Soft. There is generalized tenderness. There is no guarding.  Mildly tender only indicated verbally.  Genitourinary:  Genitourinary Comments: No external hemorrhoids, fissures, or lesions noted. No gross blood or stool burden. No rectal tenderness. RN, WMariann Laster served as chaperone during the rectal exam.  Musculoskeletal: She exhibits no edema.  Lymphadenopathy:    She has no cervical adenopathy.  Neurological: She is alert.  Skin: Skin is warm and dry. She is not diaphoretic.  Psychiatric: She has a normal mood and affect. Her behavior is normal.  Nursing note and vitals reviewed.    ED Treatments / Results  Labs (all labs ordered are listed, but only abnormal results are displayed) Labs Reviewed  COMPREHENSIVE METABOLIC PANEL - Abnormal; Notable for the following:       Result Value   Potassium 3.2 (*)    Glucose, Bld 168 (*)    ALT 10 (*)    All other components within normal limits  URINALYSIS, ROUTINE W REFLEX MICROSCOPIC - Abnormal; Notable for the following:    Ketones, ur 5 (*)    Leukocytes, UA TRACE (*)    Squamous Epithelial / LPF 0-5 (*)    All other components within normal limits  LIPASE, BLOOD  CBC    EKG  EKG Interpretation None       Radiology Ct Abdomen Pelvis W Contrast  Result Date: 04/05/2017 CLINICAL DATA:  Constipation with vomiting for several months. EXAM: CT ABDOMEN AND PELVIS  WITH CONTRAST TECHNIQUE: Multidetector CT imaging of the abdomen and pelvis was performed using the standard protocol following bolus administration of intravenous contrast. CONTRAST:  1022mISOVUE-300 IOPAMIDOL (ISOVUE-300) INJECTION 61% COMPARISON:  10/02/2013 FINDINGS: Lower chest: Borderline heart size. Coronary atherosclerotic calcification. No acute finding Hepatobiliary: There may be a degree of hepatic steatosis. No  evidence of mass lesion. Mild lobulation of the liver surface but no caudate enlargement or fissure widening.Cholecystectomy. Negative common bile duct. Pancreas: No acute finding.  Generalized fatty atrophy. Spleen: Unremarkable. Adrenals/Urinary Tract: Negative adrenals. No hydronephrosis or stone. Upper pole scarring on the right; patient has history of partial nephrectomy. Unremarkable bladder. Stomach/Bowel: No obstruction. No appendicitis. Moderate colonic diverticulosis. Vascular/Lymphatic: No acute vascular abnormality. No mass or adenopathy. Reproductive:Hysterectomy.  Negative adnexae. Other: No ascites or pneumoperitoneum. Musculoskeletal: No acute abnormalities. Advanced L5-S1 disc degeneration. Advanced left hip osteoarthritis with subchondral cysts and bone-on-bone contact. IMPRESSION: 1. No acute finding.  No obstruction or abnormal stool retention. 2. Colonic diverticulosis. 3. Cholecystectomy. Electronically Signed   By: Monte Fantasia M.D.   On: 04/05/2017 16:56    Procedures Procedures (including critical care time)  Medications Ordered in ED Medications  ondansetron (ZOFRAN-ODT) 4 MG disintegrating tablet (4 mg  Given 04/05/17 1229)  iopamidol (ISOVUE-300) 61 % injection (100 mLs  Contrast Given 04/05/17 1633)  sodium chloride 0.9 % bolus 1,000 mL (0 mLs Intravenous Stopped 04/05/17 1932)  potassium chloride SA (K-DUR,KLOR-CON) CR tablet 40 mEq (40 mEq Oral Given 04/05/17 1728)     Initial Impression / Assessment and Plan / ED Course  I have reviewed the triage  vital signs and the nursing notes.  Pertinent labs & imaging results that were available during my care of the patient were reviewed by me and considered in my medical decision making (see chart for details).  Clinical Course as of Apr 05 2056  Mon Apr 05, 2017  Sharilyn Sites Spoke with patient's daughter, [name] [phone]). Wanted to speak with [place] regarding an update on patient's condition and findings. Patient gave verbal permission for this. [name] also requests that we look at the possibility for admission.  [SJ]  1945 Spoke with Dr. Loleta Books, who states he would like to speak with the Case Manager regarding coming up with a concrete admission and discharge plan.  [SJ]  2000 Spoke with Mariann Laster, Case Manager, who states she will contact Dr. Loleta Books regarding an admission and discharge plan.  [SJ]  2015 Spoke with Dr. Loleta Books and discussed possibility for admission. We agree there is no concrete admission criteria met by this patient and that a good follow up plan and home health evaluation has been put in place.  [SJ]    Clinical Course User Index [SJ] Neeko Pharo C, PA-C    Patient presents with complaint of constipation and vomiting. No noted significant stool burden or obstruction noted on CT. Patient able to tolerate PO. Patient's daughter, [name], is at the bedside and states she will be caring for the patient at home until any home health assistance can be set up. Patient states she is comfortable with this plan. Return precautions discussed. Patient and her daughter voice understanding of all instructions and are comfortable with discharge.    Findings and plan of care discussed with Quintella Reichert, MD. Dr. Ralene Bathe personally evaluated and examined this patient.   Vitals:   04/05/17 1745 04/05/17 1800 04/05/17 1915 04/05/17 1930  BP: (!) 170/97 (!) 163/87 (!) 173/98 (!) 162/108  Pulse: 68 69 60 (!) 58  Resp:      Temp:      TempSrc:      SpO2: 94% 95% 96% 96%     Final Clinical  Impressions(s) / ED Diagnoses   Final diagnoses:  Other constipation    New Prescriptions New Prescriptions   ONDANSETRON (ZOFRAN ODT) 4 MG DISINTEGRATING TABLET  Take 1 tablet (4 mg total) by mouth every 8 (eight) hours as needed for nausea or vomiting.     Layla Maw 04/05/17 2057    Quintella Reichert, MD 04/09/17 [phone]

## 2017-04-05 NOTE — Discharge Instructions (Signed)
Please be sure to stay well hydrated by drinking plenty of water. Eat regular nutritious meals high in fiber. The use Zofran as needed for nausea and/or vomiting. Follow-up with your primary care provider on this matter. Return to ED should symptoms worsen.

## 2017-04-05 NOTE — ED Notes (Signed)
This RN placed pt on bedpan. Pt with generalized weakness and unable to lift hips up, had to roll pt side to side.

## 2017-04-05 NOTE — ED Notes (Signed)
ED Provider at bedside. 

## 2017-04-05 NOTE — ED Notes (Signed)
Pt verbalized understanding of discharge instructions.

## 2017-04-05 NOTE — ED Triage Notes (Signed)
Pt sts constipation; pt seen Friday for same no relief; pt sts N/V

## 2017-04-05 NOTE — Care Management (Signed)
ED CM spoke with Daughter [name] concerning patient not meeting criteria for an inpatient hospital stay, daughter is agreeable, explained that I spoke with patient regarding [facility] services recommendations however patient declined as patient has the right, she verbalized understanding. She states, that her brother and sister will be with patient at home, and she has a walker and bedside commode.  CM spoke with patient about [facility] services she is agreeable to the telephonic service. Referral was placed to [facility] as well. No further ED CM needs identified

## 2017-04-05 NOTE — ED Notes (Signed)
Pt states she was here on Friday for constipation and abd pain states had a CT and told she NEEDED TO HAVE BM, PT  States she was told to do enemas and take stool softeners but she has yet to have bm, pt is tearful at times, pt states it is hard to do these things, states husband is no help, pt states she walks with a walker at home

## 2017-04-05 NOTE — ED Notes (Signed)
Per MD, hold off on soap suds enema for now.

## 2017-04-07 ENCOUNTER — Other Ambulatory Visit: Payer: Self-pay | Admitting: *Deleted

## 2017-04-07 ENCOUNTER — Encounter: Payer: Self-pay | Admitting: *Deleted

## 2017-04-07 NOTE — Patient Outreach (Signed)
2:05 pm - Received a call from pt,provided her name, states you are the one who called me earlier.  RN CM requested of pt to  provide further HIPAA identifiers to which she  refused.   Pt reported she is sick and they did not do nothing for me (recent ED visit). Again RN CM asked pt for  further HIPAA identifiers/pt refused, got frustrated/declined RN CM services.     Plan:  RN CM to inform [facility] CM assistant to close case- pt declined services.             Plan to inform Dr. [name] of referral received from ED RN CM, pt declined.   Zara Chess.   [facility]  [phone]

## 2017-04-07 NOTE — Patient Outreach (Signed)
Phone call made, follow up on referral received 5/22 from [facility] ED RN CM to follow up with pt on recent ED visit (view in Epic 5/21- constipation, emesis).   Per referral pt had 4 ED visits in the past 6 months, dx  given - Stroke: Ischemic/TIA.  Spoke with pt, would not provide RN CM  HIPAA identifiers, reports sick will have to call back, phone disconnected.    Plan:  RN CM to follow up again telephonically  within next  2 days.  Zara Chess.   [place] [facility]  [phone]

## 2017-04-08 ENCOUNTER — Ambulatory Visit (INDEPENDENT_AMBULATORY_CARE_PROVIDER_SITE_OTHER): Payer: PPO | Admitting: Psychology

## 2017-04-08 ENCOUNTER — Ambulatory Visit: Payer: PPO | Admitting: Neurology

## 2017-04-08 ENCOUNTER — Encounter: Payer: Self-pay | Admitting: Psychology

## 2017-04-08 DIAGNOSIS — S065XAA Traumatic subdural hemorrhage with loss of consciousness status unknown, initial encounter: Secondary | ICD-10-CM

## 2017-04-08 DIAGNOSIS — I6200 Nontraumatic subdural hemorrhage, unspecified: Secondary | ICD-10-CM | POA: Diagnosis not present

## 2017-04-08 DIAGNOSIS — S065X9A Traumatic subdural hemorrhage with loss of consciousness of unspecified duration, initial encounter: Secondary | ICD-10-CM

## 2017-04-08 DIAGNOSIS — R4189 Other symptoms and signs involving cognitive functions and awareness: Secondary | ICD-10-CM

## 2017-04-08 NOTE — Progress Notes (Signed)
   Neuropsychology Note  Carolyn [name] came in today for 1 hour of neuropsychological testing with technician, Milana Kidney, BS, under the supervision of Dr. Macarthur Critchley. The patient did not appear overtly distressed by the testing session, per behavioral observation or via self-report to the technician. Rest breaks were offered. Carolyn [name] will return within 2 weeks for a feedback session with Dr. Si Raider at which time her test performances, clinical impressions and treatment recommendations will be reviewed in detail. The patient understands she can contact our office should she require our assistance before this time.  Full report to follow.

## 2017-04-08 NOTE — Progress Notes (Signed)
NEUROPSYCHOLOGICAL INTERVIEW (CPT: [id number])  Name: Carolyn [name] Date of Birth: [birth year] Date of Interview: 04/08/2017  Reason for Referral:  Carolyn [name] is a 83 y.o. female who is referred for neuropsychological evaluation by Dr. Metta Clines of [facility] due to concerns about cognitive changes status-post subdural hematoma. This patient is accompanied in the office by her daughter, [name], who supplements the history.   History of Presenting Problem:  [name] was reportedly in her usual state of health when she fell on concrete on 01/20/2016 in the presence of her daughter. CT of the head reportedly showed small bilateral subdural hematoma. She was monitored overnight. Neurosurgery assessed her and didn't feel intervention was warranted.  She was discharged and then developed headaches and slurred speech.  She returned to the ED on 02/06/16.    CT of head revealed left greater than right subdural hematoma with mass effect, requiring evacuation via left craniotomy.  She was started on Keppra 1000mg  twice daily for suspected partial seizure, presenting as transient dysphasia (slurred speech with difficulty talking).  She was also started on topiramate for headache.  In rehab, she exhibited waxing and waning mental status as well as worsening aphasia and right sided weakness.  Repeat CT of head from 02/14/16 and 02/17/16 were stable, revealing unchanged left 6 mm SHD with rightward midline shift.  Due to cognitive decline, she returned to the OR on 02/17/16 and underwent another evacuation.  Postoperatively, she continued to have worsening aphasia and rights sided weakness.  Follow up CT of head revealed reaccumulation of left SDH with mass effect, requiring another evacuation.  EEG was completed on 02/21/16 and Keppra was increased to 1250mg  twice daily.  She later developed right hand tremor and Keppra was further increased to 1750mg  twice daily. [Note: On 03/26/2017, Dr. Tomi Likens reviewed the EEG from  02/21/16 and stated it "in fact does not show any epileptiform discharges.  We repeated the EEG yesterday which revealed mild left hemispheric slowing consistent with prior SDH and subsequent craniotomy but does not reveal any epileptiform discharges."] On 01/08/17, she presented to the ED for dizziness, described as spinning sensation when supine. CT of head demonstrated stable posterior left frontal encephalomalacia and postsurgical changes from craniotomy. Due to worsening mood, she was transitioned from Prosser to Vimpat.  Mood improved but she developed "thick tongue", causing difficulty speaking.  She also endorsed dizziness consistent with positional vertigo, which was diagnosed in the ED as BPPV.  Thought to be a symptom of Keppra, she subsequently discontinued the Keppra and Vimpat was increased to 75mg  twice daily.  The thick tongue sensation has persisted.  Due to sensation of "thick tongue" after starting Vimpat, she was transitioned to Lamictal.  An MRI of the brain was also performed on 02/10/17 to evaluate for any new stroke or recurrence of SDH, which was personally reviewed by Dr. Tomi Likens and was stable without acute changes.  During the Lamictal titration, she was exhibiting crying spells and increased dysarthria and "thick tongue".  Vimpat was tapered off.  Afterwards, she endorsed improvement in memory and right hand tremor.  However, after a couple of days, she reported worsening speech and mood as well as dizziness and nausea.   She reduced the Lamictal from 100mg  to 75mg .  Due to persistent nausea, episode of vomiting,"tongue swelling", and some word-finding difficulties she was advised to go to the ED on 03/24/17.  In ED, her exam was unremarkable except for some mild word-finding difficulties.  She was  afebrile and labs revealed no leukocytosis.  UA was negative except for small leukocytes, which may have been contaminant.  CMP was unremarkable.  CT of head was personally reviewed by Dr. Tomi Likens and  revealed no acute findings.  Nausea was thought to be due to Lamictal or doxycycline (which she had just recently finished). The patient was in the ED again on 04/02/17 and 04/05/17, first for nausea and vomiting and then for constipation. Since then she has stopped taking all antiseizure medication. The patient's daughter thinks her speech and short term memory have improved since stopping the medication about a week ago. However, the nausea and vomiting reportedly persist.   She has not had any more falls since the one last year.  Psychiatric history was denied.    Current Functioning: [name] lives with her husband in a private residence. [name] is working in [place] but comes to stay with them three days a week when she is off, and the patient's other daughter [name] comes on the weekends. The patient is independent with all ADLs. She walks with a walker due to knee pain. She uses a wheelchair for doctor's visits. She drives if she feels up to it physically. She has not had any issues with getting lost. She administers her medications independently on a daily basis with no difficulty. She manages appointments independently. She does not cook any longer because she cannot stand that long. She has never managed the bills/finances.  Cognitively, the patient's biggest complaint is speech articulation difficulties, but she does think these are improving. Speech is worse at night when she is tired or when she is stressed/upset. Her daughter thinks she is starting to have more normal rate of speech. The patient had in home speech therapy in the past and wishes she could do more of this.  She presents with significant frustration and irritability regarding the medical care she has received over the past year. Otherwise she denies any persistent depression or anxiety.   Social History: Born/Raised: [place] Education: High school Marital history: Married x61 years. Has 2 daughters and 1 son and a  "grand-puppy".  She does not drink alcohol regularly. She is a former smoker, having quit many years ago.    Medical History: Past Medical History:  Diagnosis Date  . Asthma   . Cataracts, bilateral   . Chronic kidney disease 2007   kidney cancer and partial nephrctomy-right  . COPD (chronic obstructive pulmonary disease) ([place])   . Diabetes mellitus   . Gallstones   . Glaucoma   . Hypercholesterolemia   . Hypertension   . Hypothyroid   . PONV (postoperative nausea and vomiting)   . Subdural bleeding [facility])      Current Medications:  Outpatient Encounter Prescriptions as of 04/08/2017  Medication Sig  . acetaminophen (TYLENOL) 500 MG tablet Take 500 mg by mouth every 6 (six) hours as needed for headache (pain).  [name] albuterol (PROVENTIL HFA;VENTOLIN HFA) 108 (90 Base) MCG/ACT inhaler Inhale 1 puff into the lungs every 6 (six) hours as needed for wheezing or shortness of breath.  . docusate sodium (ENEMEEZ) 283 MG enema Place 1 enema (283 mg total) rectally daily.  . Fluticasone-Salmeterol (ADVAIR) 250-50 MCG/DOSE AEPB Inhale 1 puff into the lungs 2 (two) times daily as needed (shortness of breath/ chest tightness).  [name] glimepiride (AMARYL) 1 MG tablet Take 1 mg by mouth at bedtime.  [name] glimepiride (AMARYL) 2 MG tablet Take 2 tablets (4 mg total) by mouth at bedtime. (Patient not  taking: Reported on 04/05/2017)  . latanoprost (XALATAN) 0.005 % ophthalmic solution Place 1 drop into both eyes at bedtime.   [name] lisinopril (PRINIVIL,ZESTRIL) 20 MG tablet Take 1 tablet (20 mg total) by mouth at bedtime.  . metoprolol tartrate (LOPRESSOR) 25 MG tablet Take 1 tablet (25 mg total) by mouth 2 (two) times daily. (Patient not taking: Reported on 04/05/2017)  . ondansetron (ZOFRAN ODT) 4 MG disintegrating tablet Take 1 tablet (4 mg total) by mouth every 8 (eight) hours as needed for nausea or vomiting.  . ondansetron (ZOFRAN) 4 MG tablet Take 1 tablet (4 mg total) by mouth every 8 (eight) hours as needed  for nausea or vomiting.  . pantoprazole (PROTONIX) 40 MG tablet Take 1 tablet (40 mg total) by mouth daily. (Patient not taking: Reported on 04/05/2017)  . polyethylene glycol (MIRALAX / GLYCOLAX) packet Take 17 g by mouth daily as needed (constipation). Mix in 8 oz liquid and drink  . senna-docusate (SENOKOT-S) 8.6-50 MG tablet Take 2 tablets by mouth daily.  . theophylline (UNIPHYL) 400 MG 24 hr tablet Take 400 mg by mouth at bedtime.   [name] thyroid (ARMOUR) 60 MG tablet Take 60 mg by mouth at bedtime.   . verapamil (CALAN-SR) 180 MG CR tablet Take 180 mg by mouth at bedtime.  [name] zonisamide (ZONEGRAN) 50 MG capsule Take 1 capsule (50 mg total) by mouth daily. (Patient taking differently: Take 50 mg by mouth at bedtime. )   No facility-administered encounter medications on file as of 04/08/2017.    She is no longer taking Zonegran.  Behavioral Observations:   Appearance: Neatly and appropriately dressed and groomed Gait: Not observed. Patient seated in wheelchair. Speech: Dysarthric, especially with multi-syllable words. Intelligible for the most part. Thought process: Linear, goal directed Affect: Full, animated, irritable but also lots of laughter Interpersonal: Pleasant, mildly disinhibited   TESTING: There is medical necessity to proceed with neuropsychological assessment as the results will be used to aid in differential diagnosis and clinical decision-making and to inform specific treatment recommendations. Per the patient, her daughter and medical records reviewed, there has been a change in cognitive functioning and a reasonable suspicion of cognitive disorder due to subdural hematoma.  Following the clinical interview, the patient completed a full battery of neuropsychological testing with my psychometrician under my supervision.   PLAN: The patient will return to see me for a follow-up session at which time her test performances and my impressions and treatment recommendations will  be reviewed in detail.  Full report to follow.

## 2017-04-09 ENCOUNTER — Ambulatory Visit: Payer: Self-pay | Admitting: *Deleted

## 2017-04-14 DIAGNOSIS — K5901 Slow transit constipation: Secondary | ICD-10-CM | POA: Diagnosis not present

## 2017-04-14 DIAGNOSIS — K219 Gastro-esophageal reflux disease without esophagitis: Secondary | ICD-10-CM | POA: Diagnosis not present

## 2017-04-19 ENCOUNTER — Ambulatory Visit: Payer: PPO | Admitting: Neurology

## 2017-04-20 ENCOUNTER — Telehealth: Payer: Self-pay | Admitting: Psychology

## 2017-04-20 NOTE — Telephone Encounter (Signed)
Caller: [name] (Patient's daughter)   Urgent? No  Reason for the call: She had to cancel her mom's appointment for her results on Monday 04/26/17. She has to work and the next appointments I offered she could not make those either. She would like to know if she could have those results over the phone. She also wanted you to know that her mom stopped her Seizure medication 3 weeks ago and that she has been doing fine. She also said that her Speech seems better and that her Short term memory is not as bad. Thanks

## 2017-04-22 ENCOUNTER — Ambulatory Visit (INDEPENDENT_AMBULATORY_CARE_PROVIDER_SITE_OTHER): Payer: PPO | Admitting: Psychology

## 2017-04-22 ENCOUNTER — Encounter: Payer: Self-pay | Admitting: Psychology

## 2017-04-22 DIAGNOSIS — S065X9A Traumatic subdural hemorrhage with loss of consciousness of unspecified duration, initial encounter: Secondary | ICD-10-CM

## 2017-04-22 DIAGNOSIS — L814 Other melanin hyperpigmentation: Secondary | ICD-10-CM | POA: Diagnosis not present

## 2017-04-22 DIAGNOSIS — D225 Melanocytic nevi of trunk: Secondary | ICD-10-CM | POA: Diagnosis not present

## 2017-04-22 DIAGNOSIS — D1801 Hemangioma of skin and subcutaneous tissue: Secondary | ICD-10-CM | POA: Diagnosis not present

## 2017-04-22 DIAGNOSIS — S065XAA Traumatic subdural hemorrhage with loss of consciousness status unknown, initial encounter: Secondary | ICD-10-CM

## 2017-04-22 DIAGNOSIS — I6200 Nontraumatic subdural hemorrhage, unspecified: Secondary | ICD-10-CM

## 2017-04-22 DIAGNOSIS — L821 Other seborrheic keratosis: Secondary | ICD-10-CM | POA: Diagnosis not present

## 2017-04-22 DIAGNOSIS — R4189 Other symptoms and signs involving cognitive functions and awareness: Secondary | ICD-10-CM

## 2017-04-22 NOTE — Progress Notes (Signed)
NEUROPSYCHOLOGICAL EVALUATION   Name:    Carolyn [name]  Date of Birth:   November 20, 1932 Date of Interview:  04/08/2017 Date of Testing:  04/08/2017   Date of Feedback:  04/22/2017       Background Information:  Reason for Referral:  Carolyn [name] is a 83 y.o. female referred by Dr. Metta Clines to assess her current level of cognitive functioning and assist in differential diagnosis. The current evaluation consisted of a review of available medical records, an interview with the patient and her daughter, Carolyn [name], and the completion of a neuropsychological testing battery. Informed consent was obtained.  History of Presenting Problem:  Carolyn [name] was reportedly in her usual state of health when she fell on concrete on 01/20/2016 in the presence of her daughter. CT of the head reportedly showed small bilateral subdural hematoma. She was monitored overnight. Neurosurgery assessed her and didn't feel intervention was warranted. She was discharged and then developed headaches and slurred speech. She returned to the ED on 02/06/16. CT of head revealed left greater than right subdural hematoma with mass effect, requiring evacuation via left craniotomy. She was started on Keppra 1000mg  twice daily for suspected partial seizure, presenting as transient dysphasia (slurred speech with difficulty talking). She was also started on topiramate for headache. In rehab, she exhibited waxing and waning mental status as well as worsening aphasia and right sided weakness. Repeat CT of head from 02/14/16 and 02/17/16 were stable, revealing unchanged left 6 mm SHD with rightward midline shift. Due to cognitive decline, she returned to the OR on 02/17/16 and underwent another evacuation. Postoperatively, she continued to have worsening aphasia and rights sided weakness. Follow up CT of head revealed reaccumulation of left SDH with mass effect, requiring another evacuation. EEG was completed on 02/21/16 and Keppra was  increased to 1250mg  twice daily. She later developed right hand tremor and Keppra was further increased to 1750mg  twice daily. [Note: On 03/26/2017, Dr. Tomi Likens reviewed the EEG from 02/21/16 and stated it "in fact does not show any epileptiform discharges. We repeated the EEG yesterday which revealed mild left hemispheric slowing consistent with prior SDH and subsequent craniotomy but does not reveal any epileptiform discharges."] On 01/08/17, she presented to the ED for dizziness, described as spinning sensation when supine. CT of head demonstrated stable posterior left frontal encephalomalacia and postsurgical changes from craniotomy. Due to worsening mood, she was transitioned from [place] to Vimpat. Mood improved but she developed "thick tongue", causing difficulty speaking. She also endorsed dizziness consistent with positional vertigo, which was diagnosed in the ED as BPPV. Thought to be a symptom of Keppra, she subsequently discontinued the Keppra and Vimpat was increased to 75mg  twice daily. The thick tongue sensation has persisted. Due to sensation of "thick tongue" after starting Vimpat, she was transitioned to Lamictal. An MRI of the brain was also performed on 02/10/17 to evaluate for any new stroke or recurrence of SDH, which was personally reviewed by Dr. Tomi Likens and was stable without acute changes. During the Lamictal titration, she was exhibiting crying spells and increased dysarthria and "thick tongue". Vimpat was tapered off. Afterwards, she endorsed improvement in memory and right hand tremor. However, after a couple of days, she reported worsening speech and mood as well as dizziness and nausea. She reduced the Lamictal from 100mg  to 75mg . Due to persistent nausea, episode of vomiting,"tongue swelling", and some word-finding difficulties she was advised to go to the ED on 03/24/17. In ED, her exam was  unremarkable except for some mild word-finding difficulties. She was afebrile and labs  revealed no leukocytosis. UA was negative except for small leukocytes, which may have been contaminant. CMP was unremarkable. CT of head was personally reviewed by Dr. Tomi Likens and revealed no acute findings. Nausea was thought to be due to Lamictal or doxycycline (which she had just recently finished). The patient was in the ED again on 04/02/17 and 04/05/17, first for nausea and vomiting and then for constipation. Since then she has stopped taking all antiseizure medication. The patient's daughter thinks her speech and short term memory have improved since stopping the medication about a week ago. However, the nausea and vomiting reportedly persist.   She has not had any more falls since the one last year.  Psychiatric history was denied.    Current Functioning: Carolyn [name] lives with her husband in a private residence. Carolyn [name] is working in [place] but comes to stay with them three days a week when she is off, and the patient's other daughter Carolyn [name] comes on the weekends. The patient is independent with all ADLs. She walks with a walker due to knee pain. She uses a wheelchair for doctor's visits. She drives if she feels up to it physically. She has not had any issues with getting lost. She administers her medications independently on a daily basis with no difficulty. She manages appointments independently. She does not cook any longer because she cannot stand that long. She has never managed the bills/finances.  Cognitively, the patient's biggest complaint is speech articulation difficulties, but she does think these are improving. Speech is worse at night when she is tired or when she is stressed/upset. Her daughter thinks she is starting to have more normal rate of speech. The patient had in home speech therapy in the past and wishes she could do more of this.  She presents with significant frustration and irritability regarding the medical care she has received over the past year. Otherwise  she denies any persistent depression or anxiety.   Social History: Born/Raised: [place] Education: High school Marital history: Married x61 years. Has 2 daughters and 1 son and a "grand-puppy".  She does not drink alcohol regularly. She is a former smoker, having quit many years ago.    Medical History:  Past Medical History:  Diagnosis Date  . Asthma   . Cataracts, bilateral   . Chronic kidney disease 2007   kidney cancer and partial nephrctomy-right  . COPD (chronic obstructive pulmonary disease) ([place])   . Diabetes mellitus   . Gallstones   . Glaucoma   . Hypercholesterolemia   . Hypertension   . Hypothyroid   . PONV (postoperative nausea and vomiting)   . Subdural bleeding [facility])     Current medications:  Outpatient Encounter Prescriptions as of 04/22/2017  Medication Sig  . acetaminophen (TYLENOL) 500 MG tablet Take 500 mg by mouth every 6 (six) hours as needed for headache (pain).  [name] albuterol (PROVENTIL HFA;VENTOLIN HFA) 108 (90 Base) MCG/ACT inhaler Inhale 1 puff into the lungs every 6 (six) hours as needed for wheezing or shortness of breath.  . docusate sodium (ENEMEEZ) 283 MG enema Place 1 enema (283 mg total) rectally daily.  . Fluticasone-Salmeterol (ADVAIR) 250-50 MCG/DOSE AEPB Inhale 1 puff into the lungs 2 (two) times daily as needed (shortness of breath/ chest tightness).  [name] glimepiride (AMARYL) 1 MG tablet Take 1 mg by mouth at bedtime.  [name] glimepiride (AMARYL) 2 MG tablet Take 2 tablets (4 mg total) by  mouth at bedtime. (Patient not taking: Reported on 04/05/2017)  . latanoprost (XALATAN) 0.005 % ophthalmic solution Place 1 drop into both eyes at bedtime.   [name] lisinopril (PRINIVIL,ZESTRIL) 20 MG tablet Take 1 tablet (20 mg total) by mouth at bedtime.  . metoprolol tartrate (LOPRESSOR) 25 MG tablet Take 1 tablet (25 mg total) by mouth 2 (two) times daily. (Patient not taking: Reported on 04/05/2017)  . ondansetron (ZOFRAN ODT) 4 MG disintegrating tablet Take 1 tablet  (4 mg total) by mouth every 8 (eight) hours as needed for nausea or vomiting.  . ondansetron (ZOFRAN) 4 MG tablet Take 1 tablet (4 mg total) by mouth every 8 (eight) hours as needed for nausea or vomiting.  . pantoprazole (PROTONIX) 40 MG tablet Take 1 tablet (40 mg total) by mouth daily. (Patient not taking: Reported on 04/05/2017)  . polyethylene glycol (MIRALAX / GLYCOLAX) packet Take 17 g by mouth daily as needed (constipation). Mix in 8 oz liquid and drink  . theophylline (UNIPHYL) 400 MG 24 hr tablet Take 400 mg by mouth at bedtime.   [name] thyroid (ARMOUR) 60 MG tablet Take 60 mg by mouth at bedtime.   . verapamil (CALAN-SR) 180 MG CR tablet Take 180 mg by mouth at bedtime.  [name] zonisamide (ZONEGRAN) 50 MG capsule Take 1 capsule (50 mg total) by mouth daily. (Patient not taking: Reported on 04/08/2017)   No facility-administered encounter medications on file as of 04/22/2017.     Current Examination:  Behavioral Observations:  Appearance: Neatly and appropriately dressed and groomed Gait: Not observed. Patient seated in wheelchair. Speech: Dysarthric, especially with multi-syllable words. Intelligible for the most part. Thought process: Linear, goal directed Affect: Full, animated, irritable but also lots of laughter Interpersonal: Pleasant, mildly disinhibited Orientation: Oriented to person, place and most aspects of time (did not know the current date). She accurately named the current President but could not recall his immediate predecessor.  Tests Administered: . Test of Premorbid Functioning (TOPF) . Wechsler Adult Intelligence Scale-Fourth Edition (WAIS-IV): Similarities, Music therapist, Coding and Digit Span subtests . Wechsler Memory Scale-Fourth Edition (WMS-IV) Older Adult Version (ages 87-90): Logical Memory I, II and Recognition subtests  . [employer] Verbal Learning Test - 2nd Edition (CVLT-2) Short Form . Repeatable Battery for the Assessment of Neuropsychological Status (RBANS)  Form A:  Figure Copy and Recall subtests and Semantic Fluency subtest . Neuropsychological Assessment Battery (NAB) Language Module, Form 1: Naming subtest . [place] Diagnostic Aphasia Examination: Complex Ideational Material subtest . Controlled Oral Word Association Test (COWAT) . Trail Making Test A and B . Clock drawing test . Geriatric Depression Scale (GDS) 15 Item . Generalized Anxiety Disorder - 7 item screener (GAD-7)  Test Results: Note: Standardized scores are presented only for use by appropriately trained professionals and to allow for any future test-retest comparison. These scores should not be interpreted without consideration of all the information that is contained in the rest of the report. The most recent standardization samples from the test publisher or other sources were used whenever possible to derive standard scores; scores were corrected for age, gender, ethnicity and education when available.   Test Scores:  Test Name Raw Score Standardized Score Descriptor  TOPF 36/70 SS= 98 Average  WAIS-IV Subtests     Similarities 14/36 ss= 7 Low average  Block Design 38/66 ss= 14 Superior  Coding 38/135 ss= 10 Average  Digit Span Forward 12/16 ss= 14 Superior  Digit Span Backward 6/16 ss= 8 Average  WMS-IV Subtests  LM I 34/53 ss= 12 High average  LM II 15/39 ss= 10 Average  LM II Recognition 20/23 Cum %: >75 Above average  RBANS Subtests     Figure Copy 20/20 Z= 1.4 Superior  Figure Recall 8/20 Z= -0.8 Low average  Semantic Fluency 21/40 Z= 1 High average  CVLT-II Scores     Trial 1 5/9 Z= -0.5 Average  Trial 4 7/9 Z= -0.5 Average  Trials 1-4 total 25/36 T= 50 Average  SD Free Recall 8/9 Z= 1 High average  LD Free Recall 6/9 Z= 0 Average  LD Cued Recall 7/9 Z=0.5 Average  Recognition Discriminability 9/9 hits, 0 false positives Z= 1 High average  Forced Choice Recognition 9/9  WNL  NAB Language subtests     Naming 29/31 T= 53 Average  BDAE Subtest   WNL    Complex Ideational Material 12/12  WNL  COWAT-FAS 24 T= 45 Average  COWAT-Animals 23 T= 77 Very superior  Trail Making Test A  42" 0 errors T= 57 High average  Trail Making Test B  193" 1 error T= 45 Average  Clock Drawing   WNL  GDS-15 9/15  Moderate  GAD-7 9/21  Mild      Description of Test Results:  Premorbid verbal intellectual abilities were estimated to have been within the average range based on a test of word reading. Psychomotor processing speed was average to high average. Auditory attention and working memory were superior to average, respectively. Visual-spatial construction was superior. Language abilities were intact. Specifically, confrontation naming was average, and semantic verbal fluency was high average to very superior. Auditory comprehension of complex ideational material was intact. With regard to verbal memory, encoding and acquisition of non-contextual information (i.e., word list) was average. After a brief distracter task, free recall was high average (8/9 items recalled). After a delay, free recall was average (6/9 items recalled). Cued recall was average (7/9 items recalled). Performance on a yes/no recognition task was high average with 100% accuracy. On another verbal memory test, encoding and acquisition of contextual auditory information (i.e., short stories) was high average. After a delay, free recall was average. Performance on a yes/no recognition task was above average. With regard to non-verbal memory, delayed free recall of visual information was low average. Executive functioning was within normal limits overall. Mental flexibility and set-shifting were average on Trails B. Verbal fluency with phonemic search restrictions was average. Verbal abstract reasoning was low average. Performance on a clock drawing task was within normal limits. On a self-report measure of mood, the patient's responses were indicative of moderate depression at the present time.  On a self-report measure of anxiety, the patient endorsed mild anxiety at the present time.    Clinical Impressions: No cognitive impairment appreciated on exam. Results of cognitive testing were entirely within normal limits and commensurate with estimated premorbid intellectual abilities. Specifically, processing speed, auditory attention, language, visual spatial skills, learning and memory, and executive functioning were all normal, with many scores falling in the high average to very superior range. There were no impaired performances on testing. There is no evidence to suggest the presence of a neurocognitive disorder, including MCI or dementia, at this time. She is experiencing some depression and anxiety, which she relates to her recent medical experiences. The patient and her daughter were reassured that testing results are entirely within normal limits and not indicative of any underlying cognitive disorder or dementia. These test results will provide a nice baseline for future comparison if  ever needed.   Feedback to Patient:  Carolyn [name] and her daughter, Carolyn [name], were provided with feedback regarding this examination on 04/22/2017. 15 minutes was spent reviewing her test results and my impressions as detailed above.    Total time spent on this patient's case: [id number] unit for interview with psychologist; [phone] units of testing by psychometrician under psychologist's supervision; [phone] units for medical record review, scoring of neuropsychological tests, interpretation of test results, preparation of this report, and review of results to the patient by psychologist.      Thank you for your referral of KRISTINIA LEAVY. Please feel free to contact me if you have any questions or concerns regarding this report.

## 2017-04-26 ENCOUNTER — Encounter: Payer: PPO | Admitting: Psychology

## 2017-04-26 ENCOUNTER — Ambulatory Visit: Payer: PPO | Admitting: Neurology

## 2017-05-11 DIAGNOSIS — E7800 Pure hypercholesterolemia, unspecified: Secondary | ICD-10-CM | POA: Diagnosis not present

## 2017-05-11 DIAGNOSIS — E119 Type 2 diabetes mellitus without complications: Secondary | ICD-10-CM | POA: Diagnosis not present

## 2017-05-11 DIAGNOSIS — I10 Essential (primary) hypertension: Secondary | ICD-10-CM | POA: Diagnosis not present

## 2017-05-11 DIAGNOSIS — Z1389 Encounter for screening for other disorder: Secondary | ICD-10-CM | POA: Diagnosis not present

## 2017-05-11 DIAGNOSIS — Z7984 Long term (current) use of oral hypoglycemic drugs: Secondary | ICD-10-CM | POA: Diagnosis not present

## 2017-05-11 DIAGNOSIS — J45909 Unspecified asthma, uncomplicated: Secondary | ICD-10-CM | POA: Diagnosis not present

## 2017-05-11 DIAGNOSIS — Z0000 Encounter for general adult medical examination without abnormal findings: Secondary | ICD-10-CM | POA: Diagnosis not present

## 2017-05-11 DIAGNOSIS — R569 Unspecified convulsions: Secondary | ICD-10-CM | POA: Diagnosis not present

## 2017-05-11 DIAGNOSIS — E039 Hypothyroidism, unspecified: Secondary | ICD-10-CM | POA: Diagnosis not present

## 2017-05-13 DIAGNOSIS — M170 Bilateral primary osteoarthritis of knee: Secondary | ICD-10-CM | POA: Diagnosis not present

## 2017-05-28 IMAGING — CT CT HEAD W/O CM
2 series · 15 of 30 positions shown, 19 images · non-contrast
Comparison: Head CTs 02/05/2016 and earlier.

ADDENDUM:
The results of this study were related to RN Labrie on the floor by
the Radiologist Assistant, and communication documented in the PACS
or zVision Dashboard.
CLINICAL DATA: 82-year-old female status post craniotomy for
treatment of subdural hematoma. Initial encounter.

EXAM:
CT HEAD WITHOUT CONTRAST
TECHNIQUE: Contiguous axial images were obtained from the base of the skull
through the vertex without intravenous contrast.

[Series 201: head w/o, idose (1) · axial · non-contrast · 0.39mm/px · z∈[+31,+151]mm · 13 of 30 slices shown, 17 images]
[im 3/30  brain]
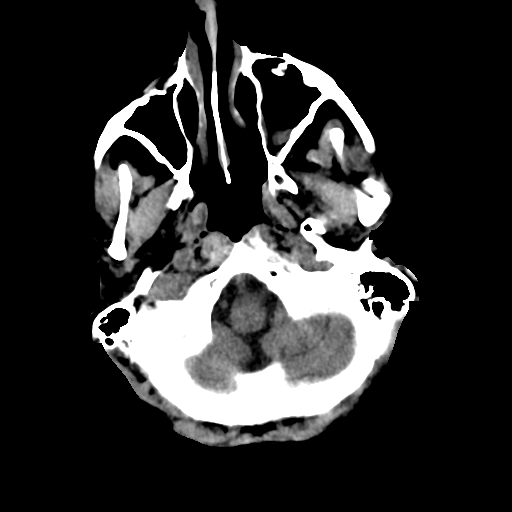
[im 3/30  bone]
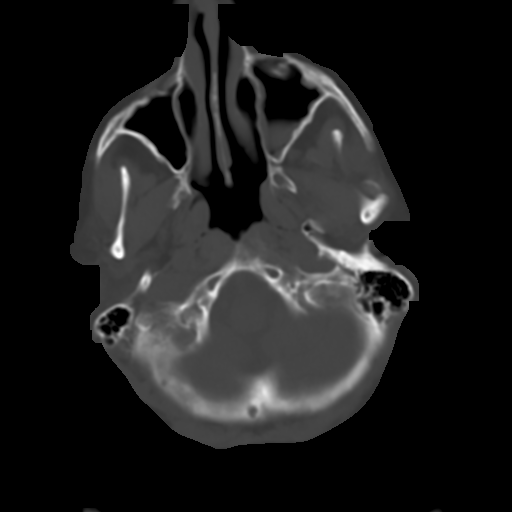
[im 5/30  brain]
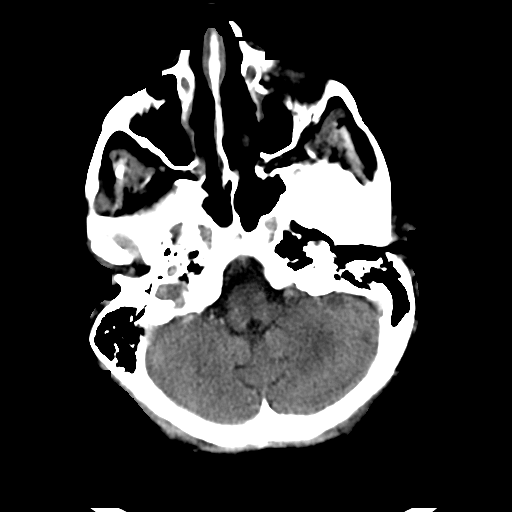
[im 7/30  brain]
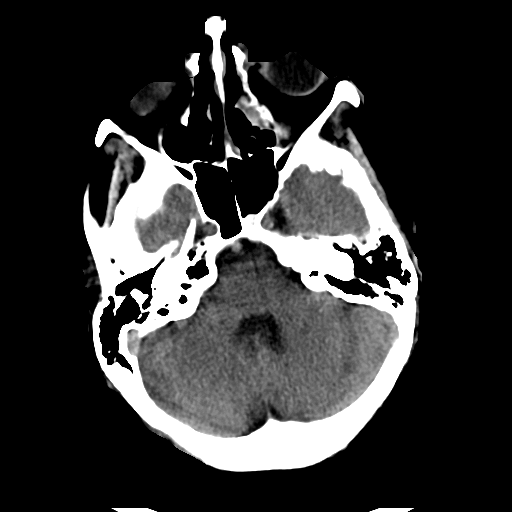
[im 9/30  brain]
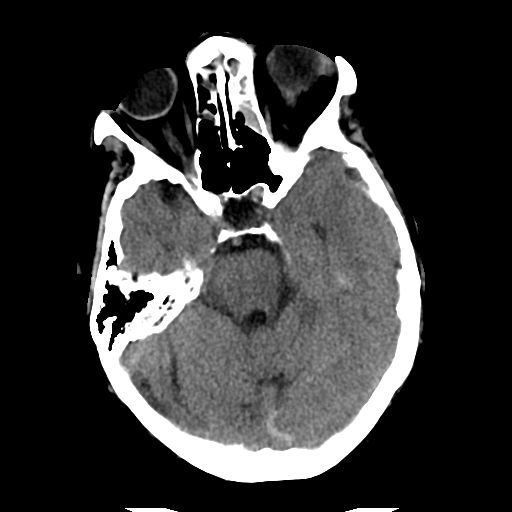
[im 11/30  brain]
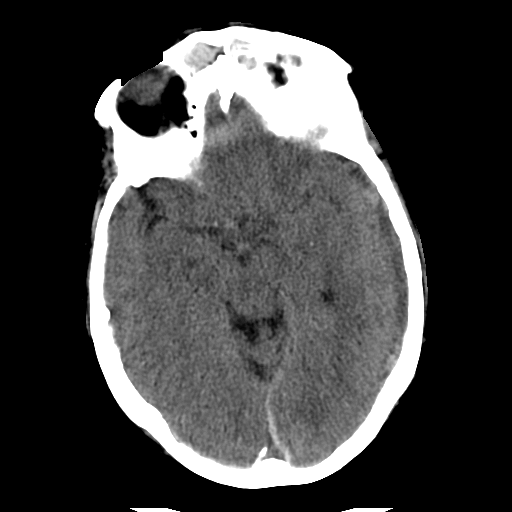
[im 11/30  bone]
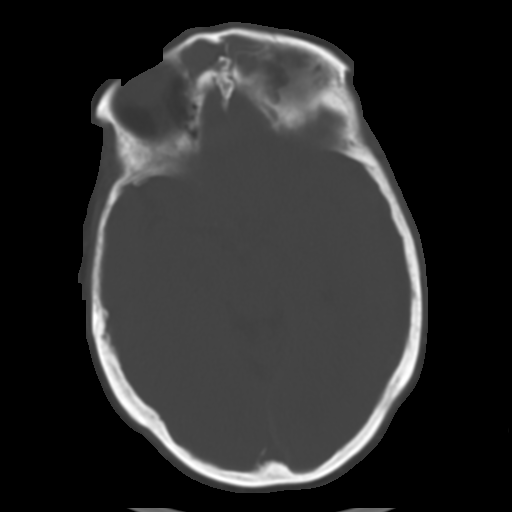
[im 13/30  brain]
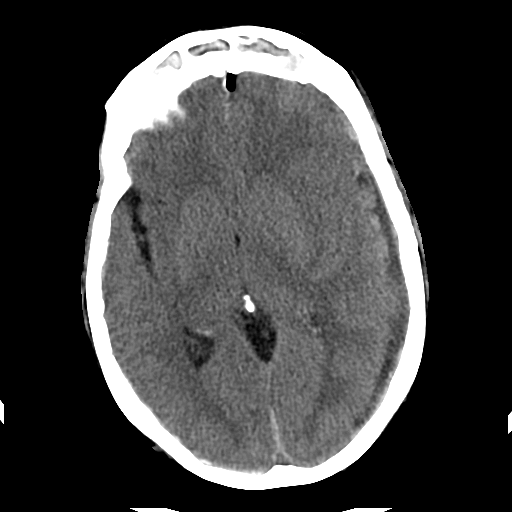
[im 15/30  brain]
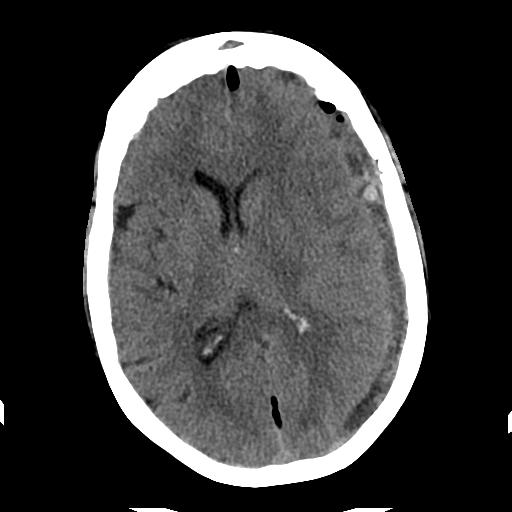
[im 17/30  brain]
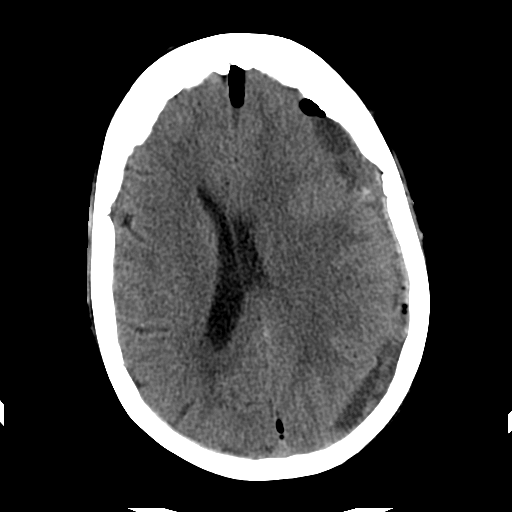
[im 19/30  brain]
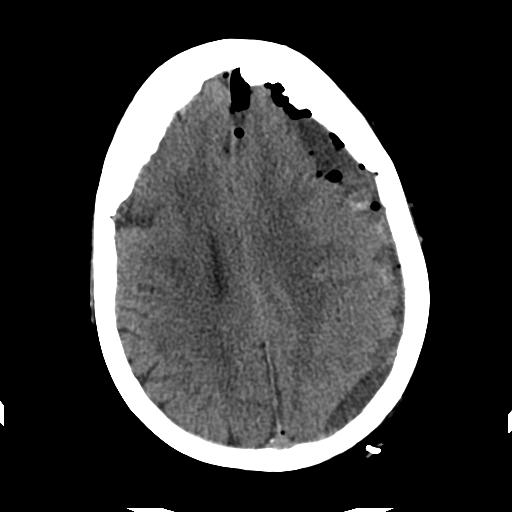
[im 19/30  bone]
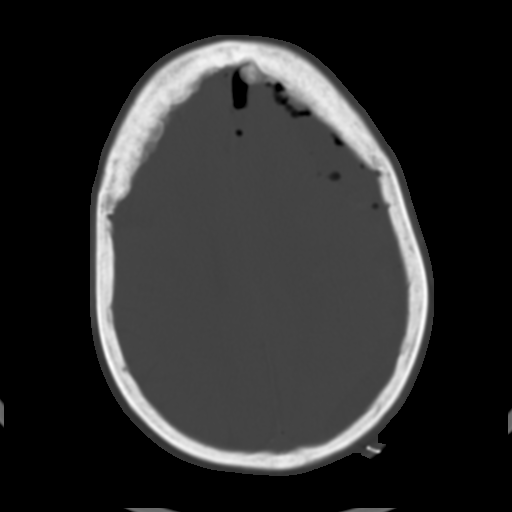
[im 21/30  brain]
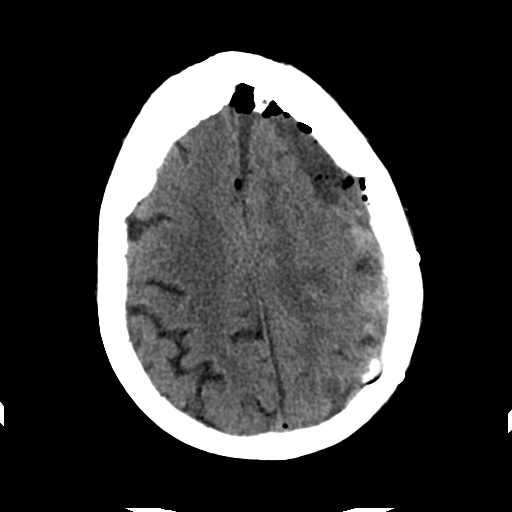
[im 23/30  brain]
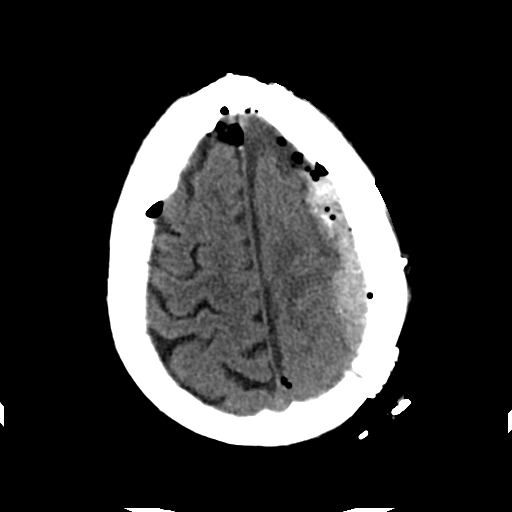
[im 25/30  brain]
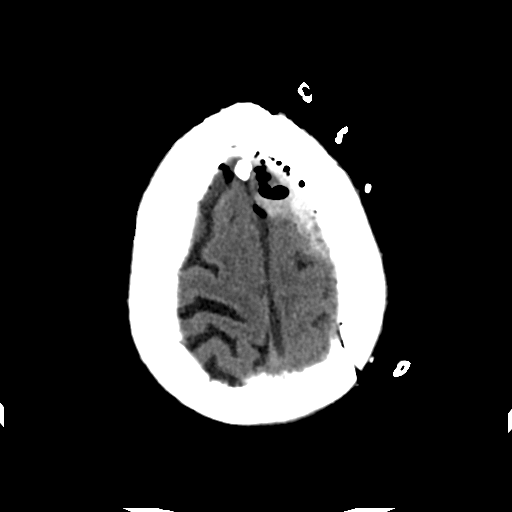
[im 27/30  brain]
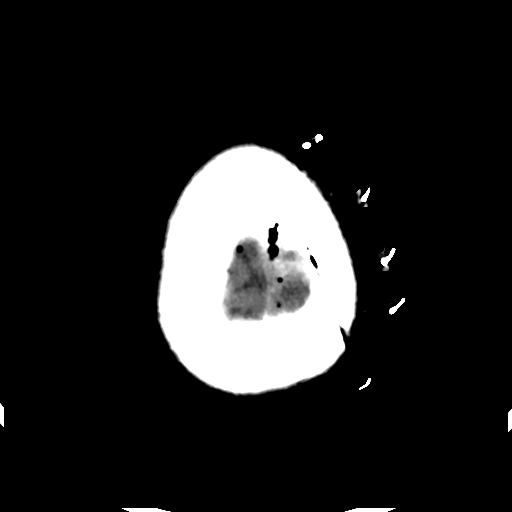
[im 27/30  bone]
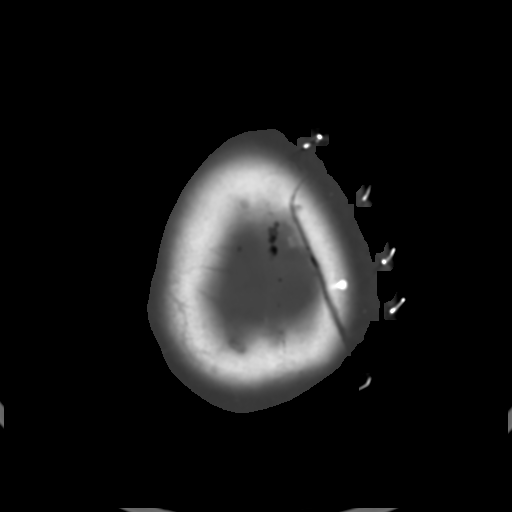

[Series 202: head w/o bone, idose (1) · axial · non-contrast · 0.39mm/px · z∈[+31,+51]mm · 2 of 30 slices shown]
[im 3/30  bone]
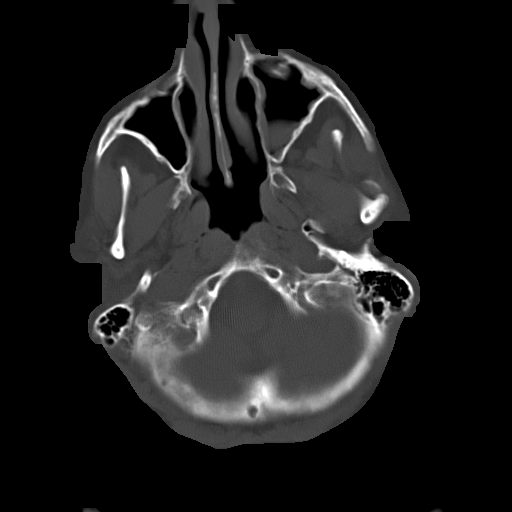
[im 7/30  bone]
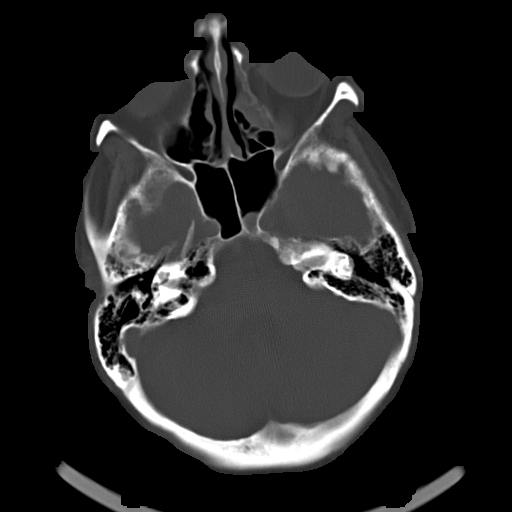

[15 of 30 positions shown; findings below may reference images not displayed]

FINDINGS: Interval left parietal craniotomy. Otherwise stable visualized
osseous structures. Stable extensive frontal and ethmoid sinus
opacification. Mastoids and tympanic cavities remain clear.

Postoperative changes to the scalp soft tissues. Stable orbits soft
tissues.

Continued mixed density left subdural hematoma, with superimposed
small volume of pneumocephalus. Persistent rightward midline shift,
which appears slightly increased from the preoperative study now 6
mm (versus 3 mm before). Effaced left lateral ventricle with no
ventriculomegaly. There is a small volume of subdural hematoma along
the interhemispheric fissure, primarily hypodense. Laterally the
subdural collection measures 7-8 mm at most levels now versus 9 mm
previously.

Basilar cisterns remain patent. No other intracranial hemorrhage
identified. No superimposed acute cortically based infarct
identified.
IMPRESSION: 1. Status post left parietal craniotomy. Small volume
pneumocephalus.
2. Continued mixed density left subdural hematoma. Rightward midline
shift has mildly increased, now 6 mm.
3. Effaced left lateral ventricle without ventriculomegaly. Basilar
cisterns remain patent.

## 2017-06-03 IMAGING — DX DG CHEST 2V
2 series · 2 of 2 positions shown · non-contrast
Comparison: 08/12/2015

CLINICAL DATA: Worsening lethargy and decreased mental status.
Postop from craniotomy for subdural hematoma evacuation.

EXAM:
CHEST  2 VIEW

[chest lat]
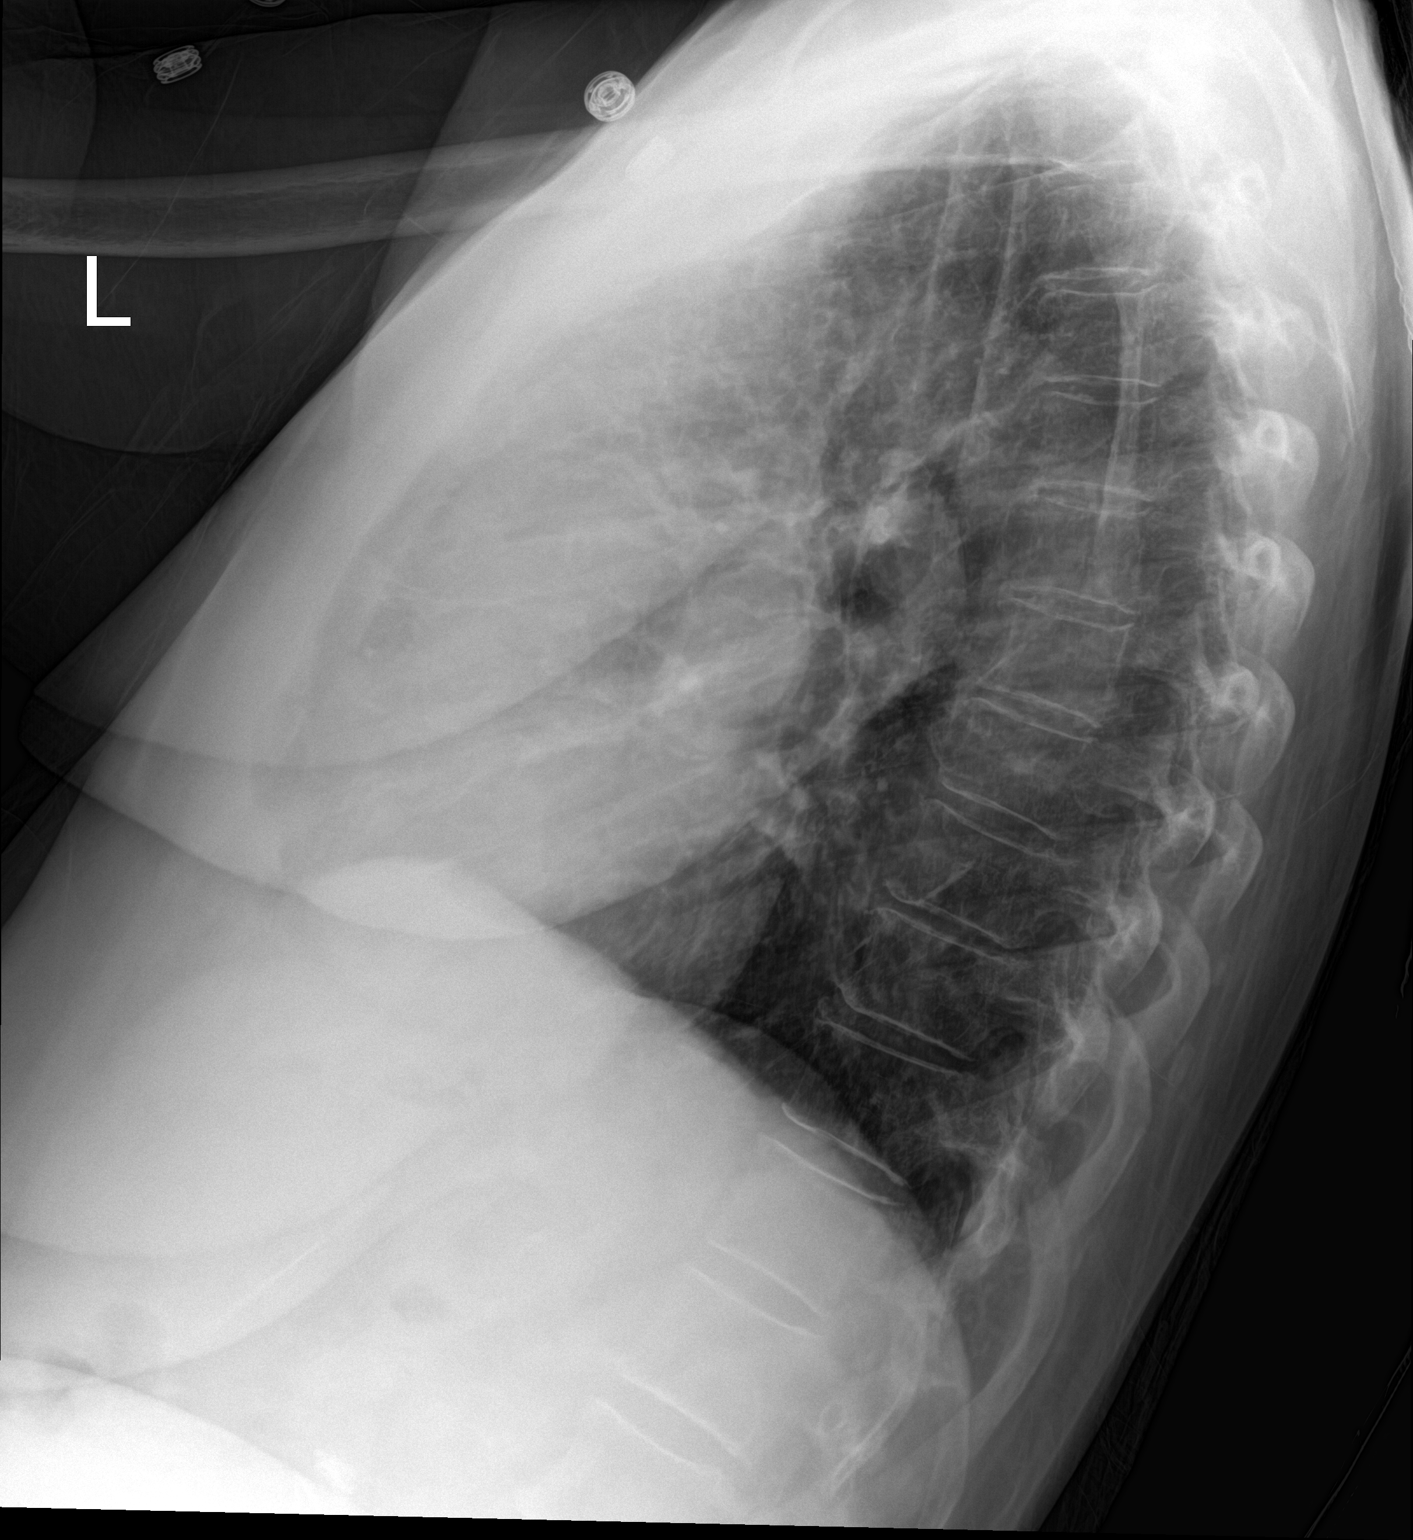

[chest ap]
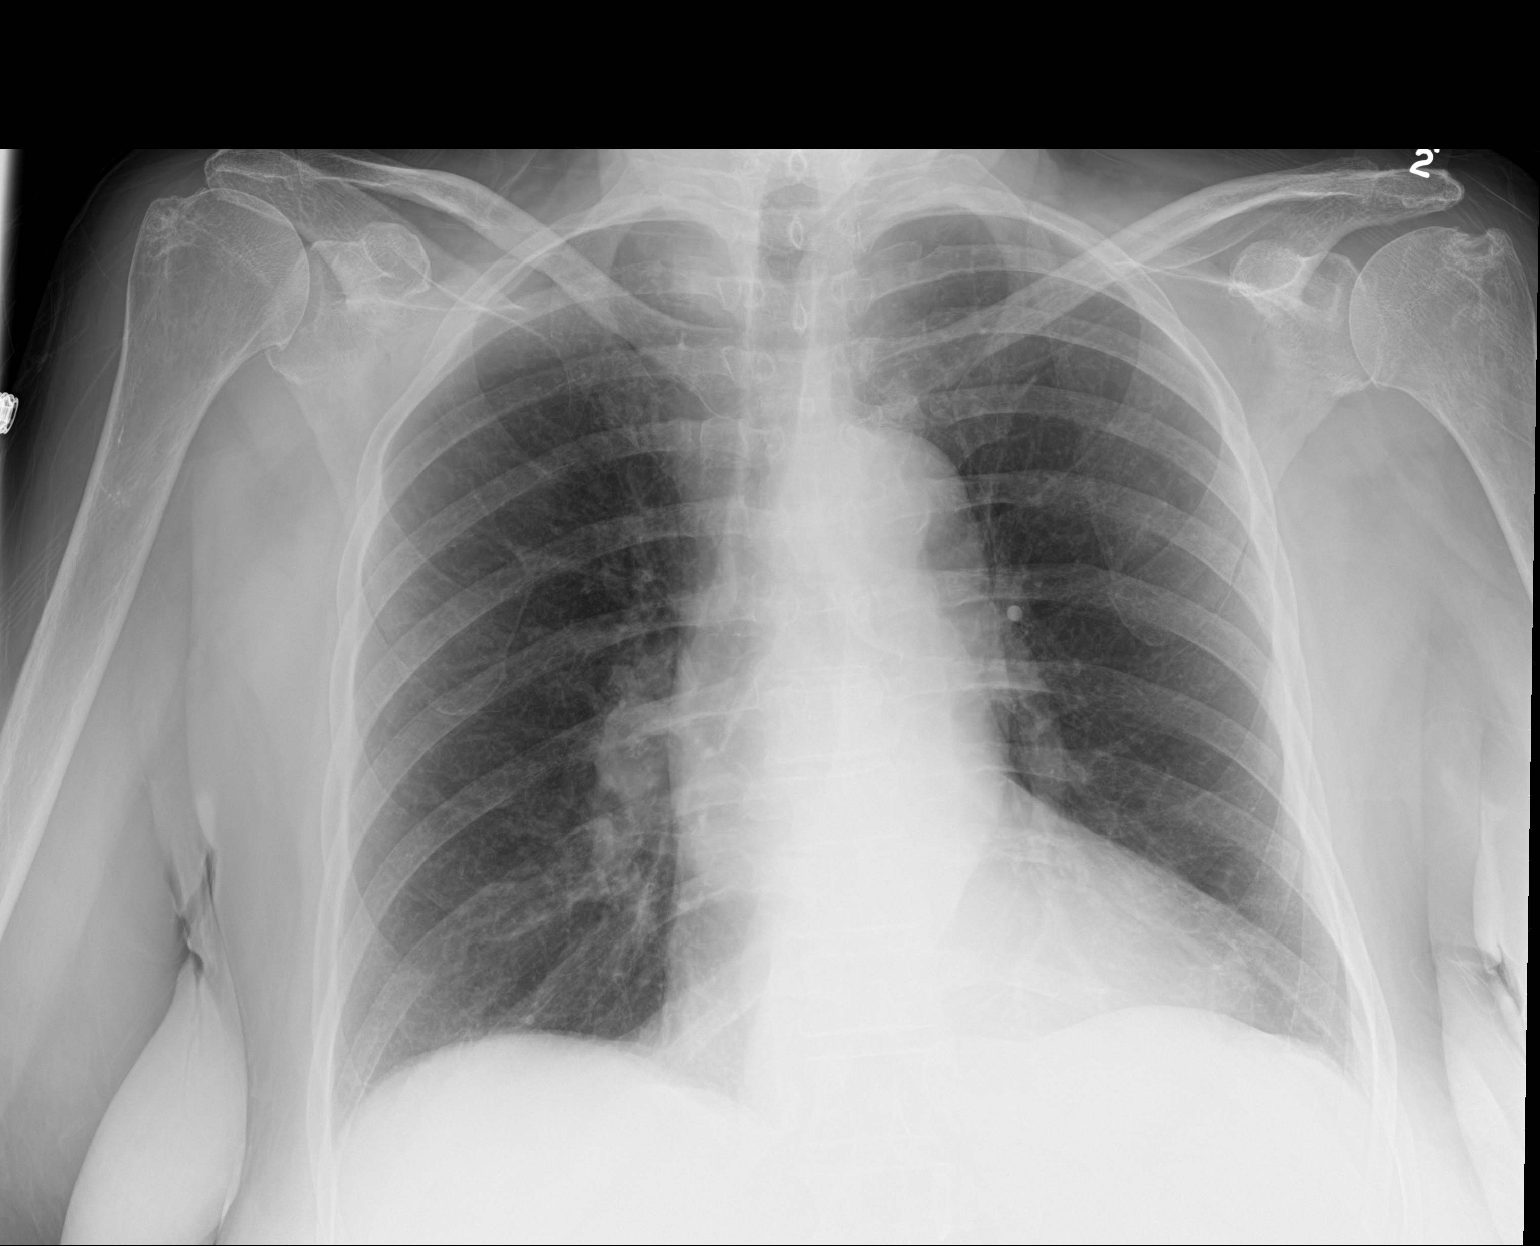

[2 of 2 positions shown; findings below may reference images not displayed]

FINDINGS: Heart size remains within normal limits. Tortuosity of thoracic
aorta is stable. Both lungs are clear. No evidence of pneumothorax
or pleural effusion.
IMPRESSION: No active cardiopulmonary disease.

## 2017-08-12 DIAGNOSIS — M170 Bilateral primary osteoarthritis of knee: Secondary | ICD-10-CM | POA: Diagnosis not present

## 2017-08-12 DIAGNOSIS — M1712 Unilateral primary osteoarthritis, left knee: Secondary | ICD-10-CM | POA: Diagnosis not present

## 2017-08-12 DIAGNOSIS — M1711 Unilateral primary osteoarthritis, right knee: Secondary | ICD-10-CM | POA: Diagnosis not present

## 2017-09-07 ENCOUNTER — Telehealth: Payer: Self-pay | Admitting: Neurology

## 2017-09-07 NOTE — Telephone Encounter (Signed)
Patient's daughter called to say that her mood has changed and that she is crying more. She is scheduled for December but would like to be seen sooner. Her daughter is worried. Please Call. Thanks

## 2017-09-07 NOTE — Telephone Encounter (Signed)
Called and LM on VM for Carolyn [name], Pts daughter. Advsd her there is not an earlier appt than Dec, but will be glad to put her on the wait list if she would like and to call and let me know if that is acceptable.

## 2017-09-08 NOTE — Telephone Encounter (Signed)
rcvd vm from Palma Holter, Pts daughter, she rqsts to be put on the wait list, put her on list

## 2017-09-21 ENCOUNTER — Ambulatory Visit: Payer: PPO | Admitting: Neurology

## 2017-09-21 ENCOUNTER — Encounter: Payer: Self-pay | Admitting: Neurology

## 2017-09-21 VITALS — BP 144/72 | HR 72 | Ht 66.0 in | Wt 196.4 lb

## 2017-09-21 DIAGNOSIS — R471 Dysarthria and anarthria: Secondary | ICD-10-CM | POA: Diagnosis not present

## 2017-09-21 DIAGNOSIS — S065X9A Traumatic subdural hemorrhage with loss of consciousness of unspecified duration, initial encounter: Secondary | ICD-10-CM

## 2017-09-21 DIAGNOSIS — S065XAA Traumatic subdural hemorrhage with loss of consciousness status unknown, initial encounter: Secondary | ICD-10-CM

## 2017-09-21 NOTE — Patient Instructions (Signed)
I think the change in slurred speech is due to increased tiredness and stress (not seizure or new stroke).  I would continue to monitor.

## 2017-09-21 NOTE — Progress Notes (Signed)
NEUROLOGY FOLLOW UP OFFICE NOTE  Carolyn [name] [id number]  HISTORY OF PRESENT ILLNESS: Carolyn [name] is an 83 year old right-handed female with hypertension, diabetes mellitus and COPD who follows up for subdural hematoma.  She is accompanied by her daughter who supplements history.   UPDATE: To see if sensation of "thick tongue", memory issues and nausea was symptom of lamotrigine, she was transitioned to zonisamide 100mg  daily.  Nausea was found to be due to constipation.  She stopped zonisimade several months ago because she felt she didn't need it.  To evaluate short term memory deficits, she underwent neuropsychological testing on 04/08/17, which revealed no evidence of cognitive impairment.    Over the past few days, her speech has been more slurred.  Sometimes, she stutters. She reports stress related to fussing with her husband.  She also handles a lot of house work.  Her sleep is poor.  HISTORY: She sustained a traumatic subdural hematoma on 01/20/16 after hitting her head from a fall when being pulled by her dog during a walk.  CT of head showed small bilateral subdural hematoma.  She was monitored overnight.  Neurosurgery assessed her and didn't feel intervention was warranted.  She was discharged and then developed headaches and slurred speech.  She returned to the ED on 02/06/16.    CT of head revealed left greater than right subdural hematoma with mass effect, requiring evacuation via left craniotomy.  She was started on Keppra 1000mg  twice daily for suspected partial seizure, presenting as transient dysphasia (slurred speech with difficulty talking).  She was also started on topiramate for headache.  In rehab, she exhibited waxing and waning mental status as well as worsening aphasia and right sided weakness.  Repeat CT of head from 02/14/16 and 02/17/16 were stable, revealing unchanged left 6 mm SHD with rightward midline shift.  Due to cognitive decline, she returned to the OR on 02/17/16  and underwent another evacuation.  Postoperatively, she continued to have worsening aphasia and rights sided weakness.  Follow up CT of head revealed reaccumulation of left SDH with mass effect, requiring another evacuation.  EEG from 02/21/16 revealed intermittent left central epileptiform discharges.  Keppra was increased to 1250mg  twice daily.  She later developed right hand tremor and Keppra was further increased to 1750mg  twice daily.   Due to worsening mood, she was transitioned from [place] to Vimpat.  Mood improved but she developed "thick tongue", causing difficulty speaking.  She also endorsed dizziness consistent with positional vertigo, which was diagnosed in the ED as BPPV.  Thought to be a symptom of Keppra, she subsequently discontinued the Keppra and Vimpat was increased to 75mg  twice daily.  The thick tongue sensation has persisted.  She also feels that she is pocketing her food in the right side of her mouth at times.  She denies new right sided weakness.  Due to sensation of "thick tongue" after starting Vimpat, she was transitioned to Lamictal.  An MRI of the brain was also performed on 02/10/17 to evaluate for any new stroke or recurrence of SDH, which was stable without acute changes.  During the Lamictal titration, she was exhibiting crying spells and increased dysarthria and "thick tongue".  Vimpat was tapered off.  Afterwards, she endorsed improvement in memory and right hand tremor.  However, after a couple of days, she reported worsening speech and mood as well as dizziness and nausea.   She reduced the Lamictal from 100mg  to 75mg .  Due to persistent nausea,  episode of vomiting,"tongue swelling", and some word-finding difficulties she was advised to go to the ED on 03/24/17.  In ED, her exam was unremarkable except for some mild word-finding difficulties.  She was afebrile and labs revealed no leukocytosis.  UA revealed negative except for small leukocytes, which may have been contaminant.   CMP was unremarkable.  CT of head was personally reviewed and revealed no acute findings.  Nausea thought to be due to Lamictal or doxycycline (which she had just recently finished.   I reviewed the EEG from 02/21/16 and it in fact does not show any epileptiform discharges.  We repeated the EEG on 03/25/17, which revealed mild left hemispheric slowing consistent with prior SDH and subsequent craniotomy but does not reveal any epileptiform discharges.  She and her daughter still report short-term memory problems.     She presented to the ED on 01/08/17 for BPPV.  CT of head demonstrated stable posterior left frontal encephalomalacia and postsurgical changes from craniotomy.  PAST MEDICAL HISTORY: Past Medical History:  Diagnosis Date  . Asthma   . Cataracts, bilateral   . Chronic kidney disease 2007   kidney cancer and partial nephrctomy-right  . COPD (chronic obstructive pulmonary disease) ([place])   . Diabetes mellitus   . Gallstones   . Glaucoma   . Hypercholesterolemia   . Hypertension   . Hypothyroid   . PONV (postoperative nausea and vomiting)   . Subdural bleeding [facility])     MEDICATIONS: Current Outpatient Medications on File Prior to Visit  Medication Sig Dispense Refill  . pantoprazole (PROTONIX) 40 MG tablet Take 1 tablet (40 mg total) by mouth daily. 30 tablet 1  . acetaminophen (TYLENOL) 500 MG tablet Take 500 mg by mouth every 6 (six) hours as needed for headache (pain).    [name] albuterol (PROVENTIL HFA;VENTOLIN HFA) 108 (90 Base) MCG/ACT inhaler Inhale 1 puff into the lungs every 6 (six) hours as needed for wheezing or shortness of breath.    . docusate sodium (ENEMEEZ) 283 MG enema Place 1 enema (283 mg total) rectally daily. (Patient not taking: Reported on 09/21/2017) 30 each 0  . Fluticasone-Salmeterol (ADVAIR) 250-50 MCG/DOSE AEPB Inhale 1 puff into the lungs 2 (two) times daily as needed (shortness of breath/ chest tightness).    [name] glimepiride (AMARYL) 1 MG tablet Take 1 mg  by mouth at bedtime.    [name] glimepiride (AMARYL) 2 MG tablet Take 2 tablets (4 mg total) by mouth at bedtime. (Patient not taking: Reported on 04/05/2017) 30 tablet 1  . latanoprost (XALATAN) 0.005 % ophthalmic solution Place 1 drop into both eyes at bedtime.     [name] lisinopril (PRINIVIL,ZESTRIL) 20 MG tablet Take 1 tablet (20 mg total) by mouth at bedtime. 30 tablet 1  . metoprolol tartrate (LOPRESSOR) 25 MG tablet Take 1 tablet (25 mg total) by mouth 2 (two) times daily. (Patient not taking: Reported on 04/05/2017) 60 tablet 1  . ondansetron (ZOFRAN ODT) 4 MG disintegrating tablet Take 1 tablet (4 mg total) by mouth every 8 (eight) hours as needed for nausea or vomiting. 20 tablet 0  . ondansetron (ZOFRAN) 4 MG tablet Take 1 tablet (4 mg total) by mouth every 8 (eight) hours as needed for nausea or vomiting. 12 tablet 0  . polyethylene glycol (MIRALAX / GLYCOLAX) packet Take 17 g by mouth daily as needed (constipation). Mix in 8 oz liquid and drink    . theophylline (UNIPHYL) 400 MG 24 hr tablet Take 400 mg by mouth at  bedtime.     [name] thyroid (ARMOUR) 60 MG tablet Take 60 mg by mouth at bedtime.     . verapamil (CALAN-SR) 180 MG CR tablet Take 180 mg by mouth at bedtime.    [name] zonisamide (ZONEGRAN) 50 MG capsule Take 1 capsule (50 mg total) by mouth daily. (Patient not taking: Reported on 04/08/2017) 30 capsule 0   No current facility-administered medications on file prior to visit.     ALLERGIES: Allergies  Allergen Reactions  . Darvocet [Propoxyphene N-Acetaminophen] Nausea And Vomiting  . Metformin And Related Diarrhea  . Percocet [Oxycodone-Acetaminophen] Nausea And Vomiting  . Prednisone Swelling  . Topamax [Topiramate] Other (See Comments)    Unable to speak/swallow  . Tramadol Other (See Comments)    Masked seizure symptoms  . Vimpat [Lacosamide] Swelling    tongue  . Vicodin [Hydrocodone-Acetaminophen] Nausea And Vomiting    FAMILY HISTORY: Family History  Problem Relation Age of  Onset  . Hypertension Mother   . Hypertension Father   . Breast cancer Paternal Grandmother   . Diabetes Paternal Grandmother   . Hypertension Paternal Grandmother   . Stroke Paternal Grandmother   . Stroke Paternal Grandfather   . Heart attack Paternal Grandfather     SOCIAL HISTORY: Social History   Socioeconomic History  . Marital status: Married    Spouse name: Not on file  . Number of children: Not on file  . Years of education: Not on file  . Highest education level: Not on file  Social Needs  . Financial resource strain: Not on file  . Food insecurity - worry: Not on file  . Food insecurity - inability: Not on file  . Transportation needs - medical: Not on file  . Transportation needs - non-medical: Not on file  Occupational History  . Not on file  Tobacco Use  . Smoking status: Former Smoker    Packs/day: 1.00    Years: 5.00    Pack years: 5.00    Types: Cigarettes    Last attempt to quit: 01/11/1982    Years since quitting: 35.7  . Smokeless tobacco: Never Used  . Tobacco comment: quit maybe 5yrs  Substance and Sexual Activity  . Alcohol use: No  . Drug use: No  . Sexual activity: Not on file  Other Topics Concern  . Not on file  Social History Narrative  . Not on file    REVIEW OF SYSTEMS: Constitutional: No fevers, chills, or sweats, no generalized fatigue, change in appetite Eyes: No visual changes, double vision, eye pain Ear, nose and throat: No hearing loss, ear pain, nasal congestion, sore throat Cardiovascular: No chest pain, palpitations Respiratory:  No shortness of breath at rest or with exertion, wheezes GastrointestinaI: No nausea, vomiting, diarrhea, abdominal pain, fecal incontinence Genitourinary:  No dysuria, urinary retention or frequency Musculoskeletal:  No neck pain, back pain Integumentary: No rash, pruritus, skin lesions Neurological: as above Psychiatric: No depression, insomnia, anxiety Endocrine: No palpitations, fatigue,  diaphoresis, mood swings, change in appetite, change in weight, increased thirst Hematologic/Lymphatic:  No purpura, petechiae. Allergic/Immunologic: no itchy/runny eyes, nasal congestion, recent allergic reactions, rashes  PHYSICAL EXAM: Vitals:   09/21/17 1118  BP: (!) 144/72  Pulse: 72  SpO2: 97%   General: No acute distress.  Patient appears well-groomed.  normal body habitus. Head:  Normocephalic/atraumatic Eyes:  Fundi examined but not visualized Neck: supple, no paraspinal tenderness, full range of motion Heart:  Regular rate and rhythm Lungs:  Clear to auscultation bilaterally  Back: No paraspinal tenderness Neurological Exam: alert and oriented to person, place, and time. Attention span and concentration intact, recent and remote memory intact, fund of knowledge intact.  Speech dysarthric but fluent, language intact.  CN II-XII intact. Bulk and tone normal, muscle strength 5/5 throughout.  Sensation to light touch intact.  Deep tendon reflexes 2+ upper extremities, absent lower extremities.  Finger to nose testing intact.  Antalgic gait.  IMPRESSION: 1. History of subdural hematoma status post craniotomy 2.  Questionable seizure at time of TBI.  I reviewed the EEG and it does not reveal any epileptiform activity.  Her presentation was probably symptoms due to the SDH.  Recent repeat EEG showed mild left hemispheric slowing but no epileptiform discharges. 3.  Dysarthria.  Residual from SDH.  Fluctuations may be related to fatigue or stress but workup does not suggest seizure or stroke.    PLAN: We discussed whether she would like to restart an antiepileptic medication.  At this time, I do not think she had a seizure and do not think she has had subsequent seizures.  However, she does have scarring on the brain, which puts her at increased risk for potential seizures.  At this time, she wishes to defer any new medication.  She will follow up in 9 months.  25 minutes spent face to  face with patient, over 50% spent discussing diagnosis and management.  Metta Clines, DO  CC:  Lavone Orn, MD

## 2017-10-13 ENCOUNTER — Ambulatory Visit: Payer: PPO | Admitting: Neurology

## 2017-10-28 ENCOUNTER — Telehealth: Payer: Self-pay

## 2017-10-28 NOTE — Telephone Encounter (Signed)
Rcvd VM from Pt. She states Dr. Cyndy Freeze advsd her not to take ASA after her subdural hematoma surgery. She used Salon-Pas on her neck, which helped greatly, but noticed it contained ASA and removed it. She would like to know if she could safely use this?

## 2017-10-28 NOTE — Telephone Encounter (Signed)
It's ok to use the Salonpas patch.

## 2017-10-29 DIAGNOSIS — M1711 Unilateral primary osteoarthritis, right knee: Secondary | ICD-10-CM | POA: Diagnosis not present

## 2017-10-29 DIAGNOSIS — M170 Bilateral primary osteoarthritis of knee: Secondary | ICD-10-CM | POA: Diagnosis not present

## 2017-10-29 DIAGNOSIS — M1712 Unilateral primary osteoarthritis, left knee: Secondary | ICD-10-CM | POA: Diagnosis not present

## 2017-10-29 NOTE — Telephone Encounter (Signed)
Called and spoke with Pt, advsd ok to use SalonPas patches

## 2017-11-03 ENCOUNTER — Ambulatory Visit: Payer: PPO | Admitting: Neurology

## 2017-11-23 DIAGNOSIS — E119 Type 2 diabetes mellitus without complications: Secondary | ICD-10-CM | POA: Diagnosis not present

## 2017-11-23 DIAGNOSIS — I10 Essential (primary) hypertension: Secondary | ICD-10-CM | POA: Diagnosis not present

## 2017-11-23 DIAGNOSIS — L820 Inflamed seborrheic keratosis: Secondary | ICD-10-CM | POA: Diagnosis not present

## 2017-11-23 DIAGNOSIS — J45909 Unspecified asthma, uncomplicated: Secondary | ICD-10-CM | POA: Diagnosis not present

## 2017-11-23 DIAGNOSIS — Z8679 Personal history of other diseases of the circulatory system: Secondary | ICD-10-CM | POA: Diagnosis not present

## 2017-11-25 ENCOUNTER — Telehealth: Payer: Self-pay

## 2017-11-25 NOTE — Telephone Encounter (Signed)
Pt's daughter, [name] called. She states Pt's speech has gotten noticeably worse. Her Neurosurgeon, Dr Cyndy Freeze suggested she get a CT ASAP and Dr Tomi Likens should order. Daughter did say there are no other worsening symptoms. She is in [place] but will drive back tomorrow if Pt can be seen. Appointment available at 2:30p

## 2017-11-26 NOTE — Telephone Encounter (Signed)
A 7:15 AM appointment on Monday opened up.  Please offer her this time.  We are holding it for her.

## 2017-11-26 NOTE — Telephone Encounter (Signed)
Carolyn [name] rtrnd my call. She is concerned about the possibility of inclement weather on Monday, she will take her mother to ED on Saturday for CT and have them send to Dr Tomi Likens.

## 2017-11-26 NOTE — Telephone Encounter (Signed)
Unfortunately, it looks like the 2:30 slot was filled. If these are her recurrent habitual speech symptoms (Slurred/"thick-tongued"), then I would like to fit her in Monday or ASAP next week.  She can be given the first slot that opens for next week.  If her symptoms are different or she has other symptoms (facial droop, confusion, unilateral numbness or weakness), then she has to go to the ED.

## 2017-11-26 NOTE — Telephone Encounter (Signed)
Called and LM for [name] to rtrn call. Offered appt for 7:15 or 1pm

## 2017-12-06 DIAGNOSIS — H40013 Open angle with borderline findings, low risk, bilateral: Secondary | ICD-10-CM | POA: Diagnosis not present

## 2017-12-27 ENCOUNTER — Ambulatory Visit (INDEPENDENT_AMBULATORY_CARE_PROVIDER_SITE_OTHER): Payer: PPO | Admitting: Neurology

## 2017-12-27 ENCOUNTER — Encounter: Payer: Self-pay | Admitting: Neurology

## 2017-12-27 VITALS — BP 122/80 | HR 63 | Ht 66.0 in | Wt 207.2 lb

## 2017-12-27 DIAGNOSIS — S065XAA Traumatic subdural hemorrhage with loss of consciousness status unknown, initial encounter: Secondary | ICD-10-CM

## 2017-12-27 DIAGNOSIS — F419 Anxiety disorder, unspecified: Secondary | ICD-10-CM | POA: Diagnosis not present

## 2017-12-27 DIAGNOSIS — S065X9A Traumatic subdural hemorrhage with loss of consciousness of unspecified duration, initial encounter: Secondary | ICD-10-CM | POA: Diagnosis not present

## 2017-12-27 NOTE — Patient Instructions (Signed)
I think all of these symptoms (thick tongue, tremor, memory issues) is related to stress.  I recommend seeing the therapist that Dr, Toy Care recommends.

## 2017-12-27 NOTE — Progress Notes (Signed)
NEUROLOGY FOLLOW UP OFFICE NOTE  Carolyn [name] [id number]  HISTORY OF PRESENT ILLNESS: Carolyn [name] is an 84 year old right-handed female with hypertension, diabetes mellitus, and history of traumatic subdural hematoma who follows up for worsening slurred speech.  She is accompanied by her daughter who supplements history.   UPDATE: This is a recurrent problem.  Last month she exhibited increased speech abnormality.  It occurs when she is under increased stress.  She has a stressed relationship with her husband, sister and younger daughter.  Last week, she exhibited some tremor in the left hand.  Her short-term memory is worse.   HISTORY: She sustained a traumatic subdural hematoma on 01/20/16 after hitting her head from a fall when being pulled by her dog during a walk.  CT of head showed small bilateral subdural hematoma.  She was monitored overnight.  Neurosurgery assessed her and didn't feel intervention was warranted.  She was discharged and then developed headaches and slurred speech.  She returned to the ED on 02/06/16.    CT of head revealed left greater than right subdural hematoma with mass effect, requiring evacuation via left craniotomy.  She was started on Keppra 1000mg  twice daily for suspected partial seizure, presenting as transient dysphasia (slurred speech with difficulty talking).  She was also started on topiramate for headache.  In rehab, she exhibited waxing and waning mental status as well as worsening aphasia and right sided weakness.  Repeat CT of head from 02/14/16 and 02/17/16 were stable, revealing unchanged left 6 mm SHD with rightward midline shift.  Due to cognitive decline, she returned to the OR on 02/17/16 and underwent another evacuation.  Postoperatively, she continued to have worsening aphasia and rights sided weakness.  Follow up CT of head revealed reaccumulation of left SDH with mass effect, requiring another evacuation.  At the time, EEG from 02/21/16 was reported to  reveal intermittent left central epileptiform discharges.  Later, I looked back and reviewed that EEG myself and it did not show any epileptiform discharges.  Keppra was increased to 1250mg  twice daily.  She later developed right hand tremor and Keppra was further increased to 1750mg  twice daily.   Due to worsening mood, she was transitioned from [place] to Vimpat.  Mood improved but she developed "thick tongue", causing difficulty speaking.  She also endorsed dizziness consistent with positional vertigo, which was diagnosed in the ED as BPPV.  Thought to be a symptom of Keppra, she subsequently discontinued the Keppra and Vimpat was increased to 75mg  twice daily.  The thick tongue sensation has persisted.  She also feels that she is pocketing her food in the right side of her mouth at times.  She denies new right sided weakness.  Due to sensation of "thick tongue" after starting Vimpat, she was transitioned to Lamictal.  An MRI of the brain was also performed on 02/10/17 to evaluate for any new stroke or recurrence of SDH, which was stable without acute changes.  During the Lamictal titration, she was exhibiting crying spells and increased dysarthria and "thick tongue".  Vimpat was tapered off.  Afterwards, she endorsed improvement in memory and right hand tremor.  However, after a couple of days, she reported worsening speech and mood as well as dizziness and nausea.   She reduced the Lamictal from 100mg  to 75mg .  Due to persistent nausea, episode of vomiting,"tongue swelling", and some word-finding difficulties she was advised to go to the ED on 03/24/17.  In ED, her exam was  unremarkable except for some mild word-finding difficulties.  She was afebrile and labs revealed no leukocytosis.  UA revealed negative except for small leukocytes, which may have been contaminant.  CMP was unremarkable.  CT of head was personally reviewed and revealed no acute findings.  Nausea thought to be due to Lamictal or doxycycline  (which she had just recently finished).  To see if sensation of "thick tongue", memory issues and nausea was symptom of lamotrigine, she was transitioned to zonisamide 100mg  daily.  Nausea was found to be due to constipation.  She stopped zonisimade several months ago because she felt she didn't need it.   To evaluate short term memory deficits, she underwent neuropsychological testing on 04/08/17, which revealed no evidence of cognitive impairment.     I reviewed the EEG from 02/21/16 and it in fact does not show any epileptiform discharges.  We repeated the EEG on 03/25/17, which revealed mild left hemispheric slowing consistent with prior SDH and subsequent craniotomy but does not reveal any epileptiform discharges.   She and her daughter still report short-term memory problems.     She presented to the ED on 01/08/17 for BPPV.  CT of head demonstrated stable posterior left frontal encephalomalacia and postsurgical changes from craniotomy.  PAST MEDICAL HISTORY: Past Medical History:  Diagnosis Date  . Asthma   . Cataracts, bilateral   . Chronic kidney disease 2007   kidney cancer and partial nephrctomy-right  . COPD (chronic obstructive pulmonary disease) ([place])   . Diabetes mellitus   . Gallstones   . Glaucoma   . Hypercholesterolemia   . Hypertension   . Hypothyroid   . PONV (postoperative nausea and vomiting)   . Subdural bleeding [facility])     MEDICATIONS: Current Outpatient Medications on File Prior to Visit  Medication Sig Dispense Refill  . acetaminophen (TYLENOL) 500 MG tablet Take 500 mg by mouth every 6 (six) hours as needed for headache (pain).    [name] albuterol (PROVENTIL HFA;VENTOLIN HFA) 108 (90 Base) MCG/ACT inhaler Inhale 1 puff into the lungs every 6 (six) hours as needed for wheezing or shortness of breath.    . docusate sodium (ENEMEEZ) 283 MG enema Place 1 enema (283 mg total) rectally daily. (Patient not taking: Reported on 09/21/2017) 30 each 0  . Fluticasone-Salmeterol  (ADVAIR) 250-50 MCG/DOSE AEPB Inhale 1 puff into the lungs 2 (two) times daily as needed (shortness of breath/ chest tightness).    [name] glimepiride (AMARYL) 1 MG tablet Take 1 mg by mouth at bedtime.    [name] glimepiride (AMARYL) 2 MG tablet Take 2 tablets (4 mg total) by mouth at bedtime. (Patient not taking: Reported on 04/05/2017) 30 tablet 1  . latanoprost (XALATAN) 0.005 % ophthalmic solution Place 1 drop into both eyes at bedtime.     [name] lisinopril (PRINIVIL,ZESTRIL) 20 MG tablet Take 1 tablet (20 mg total) by mouth at bedtime. 30 tablet 1  . metoprolol tartrate (LOPRESSOR) 25 MG tablet Take 1 tablet (25 mg total) by mouth 2 (two) times daily. (Patient not taking: Reported on 04/05/2017) 60 tablet 1  . ondansetron (ZOFRAN ODT) 4 MG disintegrating tablet Take 1 tablet (4 mg total) by mouth every 8 (eight) hours as needed for nausea or vomiting. 20 tablet 0  . ondansetron (ZOFRAN) 4 MG tablet Take 1 tablet (4 mg total) by mouth every 8 (eight) hours as needed for nausea or vomiting. 12 tablet 0  . pantoprazole (PROTONIX) 40 MG tablet Take 1 tablet (40 mg total) by  mouth daily. 30 tablet 1  . polyethylene glycol (MIRALAX / GLYCOLAX) packet Take 17 g by mouth daily as needed (constipation). Mix in 8 oz liquid and drink    . theophylline (UNIPHYL) 400 MG 24 hr tablet Take 400 mg by mouth at bedtime.     [name] thyroid (ARMOUR) 60 MG tablet Take 60 mg by mouth at bedtime.     . verapamil (CALAN-SR) 180 MG CR tablet Take 180 mg by mouth at bedtime.    [name] zonisamide (ZONEGRAN) 50 MG capsule Take 1 capsule (50 mg total) by mouth daily. (Patient not taking: Reported on 04/08/2017) 30 capsule 0   No current facility-administered medications on file prior to visit.     ALLERGIES: Allergies  Allergen Reactions  . Darvocet [Propoxyphene N-Acetaminophen] Nausea And Vomiting  . Metformin And Related Diarrhea  . Percocet [Oxycodone-Acetaminophen] Nausea And Vomiting  . Prednisone Swelling  . Topamax [Topiramate] Other  (See Comments)    Unable to speak/swallow  . Tramadol Other (See Comments)    Masked seizure symptoms  . Vimpat [Lacosamide] Swelling    tongue  . Vicodin [Hydrocodone-Acetaminophen] Nausea And Vomiting    FAMILY HISTORY: Family History  Problem Relation Age of Onset  . Hypertension Mother   . Hypertension Father   . Breast cancer Paternal Grandmother   . Diabetes Paternal Grandmother   . Hypertension Paternal Grandmother   . Stroke Paternal Grandmother   . Stroke Paternal Grandfather   . Heart attack Paternal Grandfather     SOCIAL HISTORY: Social History   Socioeconomic History  . Marital status: Married    Spouse name: Not on file  . Number of children: Not on file  . Years of education: Not on file  . Highest education level: Not on file  Social Needs  . Financial resource strain: Not on file  . Food insecurity - worry: Not on file  . Food insecurity - inability: Not on file  . Transportation needs - medical: Not on file  . Transportation needs - non-medical: Not on file  Occupational History  . Not on file  Tobacco Use  . Smoking status: Former Smoker    Packs/day: 1.00    Years: 5.00    Pack years: 5.00    Types: Cigarettes    Last attempt to quit: 01/11/1982    Years since quitting: 35.9  . Smokeless tobacco: Never Used  . Tobacco comment: quit maybe 4yrs  Substance and Sexual Activity  . Alcohol use: No  . Drug use: No  . Sexual activity: Not on file  Other Topics Concern  . Not on file  Social History Narrative  . Not on file    REVIEW OF SYSTEMS: Constitutional: No fevers, chills, or sweats, no generalized fatigue, change in appetite Eyes: No visual changes, double vision, eye pain Ear, nose and throat: No hearing loss, ear pain, nasal congestion, sore throat Cardiovascular: No chest pain, palpitations Respiratory:  No shortness of breath at rest or with exertion, wheezes GastrointestinaI: No nausea, vomiting, diarrhea, abdominal pain, fecal  incontinence Genitourinary:  No dysuria, urinary retention or frequency Musculoskeletal:  No neck pain, back pain Integumentary: No rash, pruritus, skin lesions Neurological: as above Psychiatric: No depression, insomnia, anxiety Endocrine: No palpitations, fatigue, diaphoresis, mood swings, change in appetite, change in weight, increased thirst Hematologic/Lymphatic:  No purpura, petechiae. Allergic/Immunologic: no itchy/runny eyes, nasal congestion, recent allergic reactions, rashes  PHYSICAL EXAM: Vitals:   12/27/17 1106  BP: 122/80  Pulse: 63  SpO2: 98%  General: No acute distress.  Patient appears well-groomed.   Head:  Normocephalic/atraumatic Eyes:  Fundi examined but not visualized Neck: supple, no paraspinal tenderness, full range of motion Heart:  Regular rate and rhythm Lungs:  Clear to auscultation bilaterally Back: No paraspinal tenderness Neurological Exam: alert and oriented to person, place, and time. Attention span and concentration intact, recent and remote memory intact, fund of knowledge intact.  Speech fluent with fluctuation in dysarthria, language intact.  CN II-XII intact. Bulk and tone normal, muscle strength 5/5 throughout.  Sensation to light touch intact.  Deep tendon reflexes 1+ throughout.  Finger to nose testing intact.  Gait normal, Romberg negative.  IMPRESSION: 1.  History of traumatic subdural hematoma, status post craniotomy.  She has had an extensive workup by me including MRI of brain, EEG and neuropsychological testing.  She exhibits no cognitive impairment.  Repeat MRI of the brain has been negative for recurrent bleed or new stroke when she previously had worsening speech difficulty.  I think her current symptoms (speech changes, tremor, memory loss) are due to a conversion disorder related to her underlying stress and anxiety. 2.  Speech dysfunction.  Symptoms fluctuate.  Possibly residual from SDH but I believe is mostly psychogenic. 3.  I do  not believe that she ever had a seizure.  PLAN: Recommend following up with her psychiatrist for referral to psychologist for counseling. She has follow up appointment with me in August  25 minutes spent face to face with patient, over 50% spent discussing diagnosis and management.  Metta Clines, DO  CC:  Lavone Orn, MD

## 2018-01-24 ENCOUNTER — Telehealth: Payer: Self-pay | Admitting: Neurology

## 2018-01-24 NOTE — Telephone Encounter (Signed)
I cannot provide that information.  She will need to contact medical records to obtain her mother's medical records, which will include the report.

## 2018-01-24 NOTE — Telephone Encounter (Signed)
Patient's daughter requesting name of doctor who had read her mothers EEG results. Please advise

## 2018-01-24 NOTE — Telephone Encounter (Signed)
[name] left a voicemail message asking for a call back from Dr Georgie Chard nurse, she said she has a quick question but did not say what the question was

## 2018-01-25 NOTE — Telephone Encounter (Signed)
Patient daughter made aware and stated she has records but was unclear of which doctor was the one who was involved with Carolyn [name] care.

## 2018-02-17 DIAGNOSIS — M170 Bilateral primary osteoarthritis of knee: Secondary | ICD-10-CM | POA: Diagnosis not present

## 2018-02-17 DIAGNOSIS — M1710 Unilateral primary osteoarthritis, unspecified knee: Secondary | ICD-10-CM | POA: Diagnosis not present

## 2018-03-02 ENCOUNTER — Telehealth: Payer: Self-pay | Admitting: Physical Medicine & Rehabilitation

## 2018-03-02 NOTE — Telephone Encounter (Signed)
[employer] received a request for IP records including radiology and imaging studies at the [facility]. Message to IP HIM's director on who to forward request.

## 2018-03-14 ENCOUNTER — Encounter: Payer: Self-pay | Admitting: Neurology

## 2018-04-12 DIAGNOSIS — R05 Cough: Secondary | ICD-10-CM | POA: Diagnosis not present

## 2018-05-03 DIAGNOSIS — E039 Hypothyroidism, unspecified: Secondary | ICD-10-CM | POA: Diagnosis not present

## 2018-05-03 DIAGNOSIS — J45909 Unspecified asthma, uncomplicated: Secondary | ICD-10-CM | POA: Diagnosis not present

## 2018-05-03 DIAGNOSIS — E1169 Type 2 diabetes mellitus with other specified complication: Secondary | ICD-10-CM | POA: Diagnosis not present

## 2018-05-03 DIAGNOSIS — Z7984 Long term (current) use of oral hypoglycemic drugs: Secondary | ICD-10-CM | POA: Diagnosis not present

## 2018-05-03 DIAGNOSIS — R609 Edema, unspecified: Secondary | ICD-10-CM | POA: Diagnosis not present

## 2018-05-03 DIAGNOSIS — R05 Cough: Secondary | ICD-10-CM | POA: Diagnosis not present

## 2018-05-24 DIAGNOSIS — K219 Gastro-esophageal reflux disease without esophagitis: Secondary | ICD-10-CM | POA: Diagnosis not present

## 2018-05-24 DIAGNOSIS — Z0000 Encounter for general adult medical examination without abnormal findings: Secondary | ICD-10-CM | POA: Diagnosis not present

## 2018-05-24 DIAGNOSIS — Z7984 Long term (current) use of oral hypoglycemic drugs: Secondary | ICD-10-CM | POA: Diagnosis not present

## 2018-05-24 DIAGNOSIS — Z1389 Encounter for screening for other disorder: Secondary | ICD-10-CM | POA: Diagnosis not present

## 2018-05-24 DIAGNOSIS — E039 Hypothyroidism, unspecified: Secondary | ICD-10-CM | POA: Diagnosis not present

## 2018-05-24 DIAGNOSIS — I10 Essential (primary) hypertension: Secondary | ICD-10-CM | POA: Diagnosis not present

## 2018-05-24 DIAGNOSIS — E1169 Type 2 diabetes mellitus with other specified complication: Secondary | ICD-10-CM | POA: Diagnosis not present

## 2018-06-02 DIAGNOSIS — M25561 Pain in right knee: Secondary | ICD-10-CM | POA: Diagnosis not present

## 2018-06-02 DIAGNOSIS — M170 Bilateral primary osteoarthritis of knee: Secondary | ICD-10-CM | POA: Diagnosis not present

## 2018-06-21 ENCOUNTER — Encounter: Payer: Self-pay | Admitting: Neurology

## 2018-06-21 ENCOUNTER — Encounter

## 2018-06-21 ENCOUNTER — Ambulatory Visit: Payer: PPO | Admitting: Neurology

## 2018-06-21 VITALS — BP 134/70 | HR 62 | Ht 65.25 in | Wt 200.0 lb

## 2018-06-21 DIAGNOSIS — S065XAA Traumatic subdural hemorrhage with loss of consciousness status unknown, initial encounter: Secondary | ICD-10-CM

## 2018-06-21 DIAGNOSIS — S065X9A Traumatic subdural hemorrhage with loss of consciousness of unspecified duration, initial encounter: Secondary | ICD-10-CM

## 2018-06-21 NOTE — Patient Instructions (Signed)
Mediterranean Diet A Mediterranean diet refers to food and lifestyle choices that are based on the traditions of countries located on the [place]. This way of eating has been shown to help prevent certain conditions and improve outcomes for people who have chronic diseases, like kidney disease and heart disease. What are tips for following this plan? Lifestyle  Cook and eat meals together with your family, when possible.  Drink enough fluid to keep your urine clear or pale yellow.  Be physically active every day. This includes: ? Aerobic exercise like running or swimming. ? Leisure activities like gardening, walking, or housework.  Get 7-8 hours of sleep each night.  If recommended by your health care provider, drink red wine in moderation. This means 1 glass a day for nonpregnant women and 2 glasses a day for men. A glass of wine equals 5 oz (150 mL). Reading food labels  Check the serving size of packaged foods. For foods such as rice and pasta, the serving size refers to the amount of cooked product, not dry.  Check the total fat in packaged foods. Avoid foods that have saturated fat or trans fats.  Check the ingredients list for added sugars, such as corn syrup. Shopping  At the grocery store, buy most of your food from the areas near the walls of the store. This includes: ? Fresh fruits and vegetables (produce). ? Grains, beans, nuts, and seeds. Some of these may be available in unpackaged forms or large amounts (in bulk). ? Fresh seafood. ? Poultry and eggs. ? Low-fat dairy products.  Buy whole ingredients instead of prepackaged foods.  Buy fresh fruits and vegetables in-season from local farmers markets.  Buy frozen fruits and vegetables in resealable bags.  If you do not have access to quality fresh seafood, buy precooked frozen shrimp or canned fish, such as tuna, salmon, or sardines.  Buy small amounts of raw or cooked vegetables, salads, or olives from the  deli or salad bar at your store.  Stock your pantry so you always have certain foods on hand, such as olive oil, canned tuna, canned tomatoes, rice, pasta, and beans. Cooking  Cook foods with extra-virgin olive oil instead of using butter or other vegetable oils.  Have meat as a side dish, and have vegetables or grains as your main dish. This means having meat in small portions or adding small amounts of meat to foods like pasta or stew.  Use beans or vegetables instead of meat in common dishes like chili or lasagna.  Experiment with different cooking methods. Try roasting or broiling vegetables instead of steaming or sauteing them.  Add frozen vegetables to soups, stews, pasta, or rice.  Add nuts or seeds for added healthy fat at each meal. You can add these to yogurt, salads, or vegetable dishes.  Marinate fish or vegetables using olive oil, lemon juice, garlic, and fresh herbs. Meal planning  Plan to eat 1 vegetarian meal one day each week. Try to work up to 2 vegetarian meals, if possible.  Eat seafood 2 or more times a week.  Have healthy snacks readily available, such as: ? Vegetable sticks with hummus. ? Greek yogurt. ? Fruit and nut trail mix.  Eat balanced meals throughout the week. This includes: ? Fruit: 2-3 servings a day ? Vegetables: 4-5 servings a day ? Low-fat dairy: 2 servings a day ? Fish, poultry, or lean meat: 1 serving a day ? Beans and legumes: 2 or more servings a week ? Nuts   and seeds: 1-2 servings a day ? Whole grains: 6-8 servings a day ? Extra-virgin olive oil: 3-4 servings a day  Limit red meat and sweets to only a few servings a month What are my food choices?  Mediterranean diet ? Recommended ? Grains: Whole-grain pasta. Brown rice. Bulgar wheat. Polenta. Couscous. Whole-wheat bread. Oatmeal. Quinoa. ? Vegetables: Artichokes. Beets. Broccoli. Cabbage. Carrots. Eggplant. Green beans. Chard. Kale. Spinach. Onions. Leeks. Peas. Squash.  Tomatoes. Peppers. Radishes. ? Fruits: Apples. Apricots. Avocado. Berries. Bananas. Cherries. Dates. Figs. Grapes. Lemons. Melon. Oranges. Peaches. Plums. Pomegranate. ? Meats and other protein foods: Beans. Almonds. Sunflower seeds. Pine nuts. Peanuts. Cod. Salmon. Scallops. Shrimp. Tuna. Tilapia. Clams. Oysters. Eggs. ? Dairy: Low-fat milk. Cheese. Greek yogurt. ? Beverages: Water. Red wine. Herbal tea. ? Fats and oils: Extra virgin olive oil. Avocado oil. Grape seed oil. ? Sweets and desserts: Greek yogurt with honey. Baked apples. Poached pears. Trail mix. ? Seasoning and other foods: Basil. Cilantro. Coriander. Cumin. Mint. Parsley. Sage. Rosemary. Tarragon. Garlic. Oregano. Thyme. Pepper. Balsalmic vinegar. Tahini. Hummus. Tomato sauce. Olives. Mushrooms. ? Limit these ? Grains: Prepackaged pasta or rice dishes. Prepackaged cereal with added sugar. ? Vegetables: Deep fried potatoes (french fries). ? Fruits: Fruit canned in syrup. ? Meats and other protein foods: Beef. Pork. Lamb. Poultry with skin. Hot dogs. Bacon. ? Dairy: Ice cream. Sour cream. Whole milk. ? Beverages: Juice. Sugar-sweetened soft drinks. Beer. Liquor and spirits. ? Fats and oils: Butter. Canola oil. Vegetable oil. Beef fat (tallow). Lard. ? Sweets and desserts: Cookies. Cakes. Pies. Candy. ? Seasoning and other foods: Mayonnaise. Premade sauces and marinades. ? The items listed may not be a complete list. Talk with your dietitian about what dietary choices are right for you. Summary  The Mediterranean diet includes both food and lifestyle choices.  Eat a variety of fresh fruits and vegetables, beans, nuts, seeds, and whole grains.  Limit the amount of red meat and sweets that you eat.  Talk with your health care provider about whether it is safe for you to drink red wine in moderation. This means 1 glass a day for nonpregnant women and 2 glasses a day for men. A glass of wine equals 5 oz (150 mL). This information  is not intended to replace advice given to you by your health care provider. Make sure you discuss any questions you have with your health care provider. Document Released: 06/25/2016 Document Revised: 07/28/2016 Document Reviewed: 06/25/2016 Elsevier Interactive Patient Education  2018 [employer]  

## 2018-06-21 NOTE — Progress Notes (Signed)
NEUROLOGY FOLLOW UP OFFICE NOTE  Carolyn GIAMMONA [id number]  HISTORY OF PRESENT ILLNESS: Carolyn [name] is an 84 year old right-handed female with hypertension, diabetes mellitus, and history of traumatic subdural hematoma who follows up for worsening slurred speech.  She is accompanied by her daughter who supplements history.   UPDATE: Stable.  Speech impairment seems a little worse when she is emotionally stressed or tired.   HISTORY: She sustained a traumatic subdural hematoma on 01/20/16 after hitting her head from a fall when being pulled by her dog during a walk.  CT of head showed small bilateral subdural hematoma.  She was monitored overnight.  Neurosurgery assessed her and didn't feel intervention was warranted.  She was discharged and then developed headaches and slurred speech.  She returned to the ED on 02/06/16.    CT of head revealed left greater than right subdural hematoma with mass effect, requiring evacuation via left craniotomy.  She was started on Keppra 1000mg  twice daily for suspected partial seizure, presenting as transient dysphasia (slurred speech with difficulty talking).  She was also started on topiramate for headache.  In rehab, she exhibited waxing and waning mental status as well as worsening aphasia and right sided weakness.  Repeat CT of head from 02/14/16 and 02/17/16 were stable, revealing unchanged left 6 mm SHD with rightward midline shift.  Due to cognitive decline, she returned to the OR on 02/17/16 and underwent another evacuation.  Postoperatively, she continued to have worsening aphasia and rights sided weakness.  Follow up CT of head revealed reaccumulation of left SDH with mass effect, requiring another evacuation.  At the time, EEG from 02/21/16 was reported to reveal intermittent left central epileptiform discharges.  Later, I looked back and reviewed that EEG myself and it did not show any epileptiform discharges.  Keppra was increased to 1250mg  twice daily.  She  later developed right hand tremor and Keppra was further increased to 1750mg  twice daily.   Due to worsening mood, she was transitioned from [place] to Vimpat.  Mood improved but she developed "thick tongue", causing difficulty speaking.  She also endorsed dizziness consistent with positional vertigo, which was diagnosed in the ED as BPPV.  Thought to be a symptom of Keppra, she subsequently discontinued the Keppra and Vimpat was increased to 75mg  twice daily.  The thick tongue sensation has persisted.  She also feels that she is pocketing her food in the right side of her mouth at times.  She denies new right sided weakness.  Due to sensation of "thick tongue" after starting Vimpat, she was transitioned to Lamictal.  An MRI of the brain was also performed on 02/10/17 to evaluate for any new stroke or recurrence of SDH, which was stable without acute changes.  During the Lamictal titration, she was exhibiting crying spells and increased dysarthria and "thick tongue".  Vimpat was tapered off.  Afterwards, she endorsed improvement in memory and right hand tremor.  However, after a couple of days, she reported worsening speech and mood as well as dizziness and nausea.   She reduced the Lamictal from 100mg  to 75mg .  Due to persistent nausea, episode of vomiting,"tongue swelling", and some word-finding difficulties she was advised to go to the ED on 03/24/17.  In ED, her exam was unremarkable except for some mild word-finding difficulties.  She was afebrile and labs revealed no leukocytosis.  UA revealed negative except for small leukocytes, which may have been contaminant.  CMP was unremarkable.  CT of head  was personally reviewed and revealed no acute findings.  Nausea thought to be due to Lamictal or doxycycline (which she had just recently finished).  To see if sensation of "thick tongue", memory issues and nausea was symptom of lamotrigine, she was transitioned to zonisamide 100mg  daily.  Nausea was found to be due to  constipation.  She stopped zonisimade several months ago because she felt she didn't need it.   To evaluate short term memory deficits, she underwent neuropsychological testing on 04/08/17, which revealed no evidence of cognitive impairment.     I reviewed the EEG from 02/21/16 and it in fact does not show any epileptiform discharges.  We repeated the EEG on 03/25/17, which revealed mild left hemispheric slowing consistent with prior SDH and subsequent craniotomy but does not reveal any epileptiform discharges.   She and her daughter still report short-term memory problems.     She presented to the ED on 01/08/17 for BPPV.  CT of head demonstrated stable posterior left frontal encephalomalacia and postsurgical changes from craniotomy.  PAST MEDICAL HISTORY: Past Medical History:  Diagnosis Date  . Asthma   . Cataracts, bilateral   . Chronic kidney disease 2007   kidney cancer and partial nephrctomy-right  . COPD (chronic obstructive pulmonary disease) ([place])   . Diabetes mellitus   . Gallstones   . Glaucoma   . Hypercholesterolemia   . Hypertension   . Hypothyroid   . PONV (postoperative nausea and vomiting)   . Subdural bleeding [facility])     MEDICATIONS: Current Outpatient Medications on File Prior to Visit  Medication Sig Dispense Refill  . acetaminophen (TYLENOL) 500 MG tablet Take 500 mg by mouth every 6 (six) hours as needed for headache (pain).    [name] albuterol (PROVENTIL HFA;VENTOLIN HFA) 108 (90 Base) MCG/ACT inhaler Inhale 1 puff into the lungs every 6 (six) hours as needed for wheezing or shortness of breath.    . docusate sodium (ENEMEEZ) 283 MG enema Place 1 enema (283 mg total) rectally daily. (Patient not taking: Reported on 09/21/2017) 30 each 0  . Fluticasone-Salmeterol (ADVAIR) 250-50 MCG/DOSE AEPB Inhale 1 puff into the lungs 2 (two) times daily as needed (shortness of breath/ chest tightness).    [name] glimepiride (AMARYL) 1 MG tablet Take 1 mg by mouth at bedtime.    [name]  glimepiride (AMARYL) 2 MG tablet Take 2 tablets (4 mg total) by mouth at bedtime. (Patient not taking: Reported on 04/05/2017) 30 tablet 1  . latanoprost (XALATAN) 0.005 % ophthalmic solution Place 1 drop into both eyes at bedtime.     [name] lisinopril (PRINIVIL,ZESTRIL) 20 MG tablet Take 1 tablet (20 mg total) by mouth at bedtime. 30 tablet 1  . metoprolol tartrate (LOPRESSOR) 25 MG tablet Take 1 tablet (25 mg total) by mouth 2 (two) times daily. (Patient not taking: Reported on 04/05/2017) 60 tablet 1  . ondansetron (ZOFRAN ODT) 4 MG disintegrating tablet Take 1 tablet (4 mg total) by mouth every 8 (eight) hours as needed for nausea or vomiting. 20 tablet 0  . ondansetron (ZOFRAN) 4 MG tablet Take 1 tablet (4 mg total) by mouth every 8 (eight) hours as needed for nausea or vomiting. 12 tablet 0  . pantoprazole (PROTONIX) 40 MG tablet Take 1 tablet (40 mg total) by mouth daily. 30 tablet 1  . polyethylene glycol (MIRALAX / GLYCOLAX) packet Take 17 g by mouth daily as needed (constipation). Mix in 8 oz liquid and drink    . theophylline (UNIPHYL) 400 MG  24 hr tablet Take 400 mg by mouth at bedtime.     [name] thyroid (ARMOUR) 60 MG tablet Take 60 mg by mouth at bedtime.     . verapamil (CALAN-SR) 180 MG CR tablet Take 180 mg by mouth at bedtime.     No current facility-administered medications on file prior to visit.     ALLERGIES: Allergies  Allergen Reactions  . Darvocet [Propoxyphene N-Acetaminophen] Nausea And Vomiting  . Metformin And Related Diarrhea  . Percocet [Oxycodone-Acetaminophen] Nausea And Vomiting  . Prednisone Swelling  . Topamax [Topiramate] Other (See Comments)    Unable to speak/swallow  . Tramadol Other (See Comments)    Masked seizure symptoms  . Vimpat [Lacosamide] Swelling    tongue  . Vicodin [Hydrocodone-Acetaminophen] Nausea And Vomiting    FAMILY HISTORY: Family History  Problem Relation Age of Onset  . Hypertension Mother   . Hypertension Father   . Breast cancer  Paternal Grandmother   . Diabetes Paternal Grandmother   . Hypertension Paternal Grandmother   . Stroke Paternal Grandmother   . Stroke Paternal Grandfather   . Heart attack Paternal Grandfather     SOCIAL HISTORY: Social History   Socioeconomic History  . Marital status: Married    Spouse name: Not on file  . Number of children: Not on file  . Years of education: Not on file  . Highest education level: Not on file  Occupational History  . Not on file  Social Needs  . Financial resource strain: Not on file  . Food insecurity:    Worry: Not on file    Inability: Not on file  . Transportation needs:    Medical: Not on file    Non-medical: Not on file  Tobacco Use  . Smoking status: Former Smoker    Packs/day: 1.00    Years: 5.00    Pack years: 5.00    Types: Cigarettes    Last attempt to quit: 01/11/1982    Years since quitting: 36.4  . Smokeless tobacco: Never Used  . Tobacco comment: quit maybe 60yrs  Substance and Sexual Activity  . Alcohol use: No  . Drug use: No  . Sexual activity: Not on file  Lifestyle  . Physical activity:    Days per week: Not on file    Minutes per session: Not on file  . Stress: Not on file  Relationships  . Social connections:    Talks on phone: Not on file    Gets together: Not on file    Attends religious service: Not on file    Active member of club or organization: Not on file    Attends meetings of clubs or organizations: Not on file    Relationship status: Not on file  . Intimate partner violence:    Fear of current or ex partner: Not on file    Emotionally abused: Not on file    Physically abused: Not on file    Forced sexual activity: Not on file  Other Topics Concern  . Not on file  Social History Narrative  . Not on file    REVIEW OF SYSTEMS: Constitutional: No fevers, chills, or sweats, no generalized fatigue, change in appetite Eyes: No visual changes, double vision, eye pain Ear, nose and throat: No hearing  loss, ear pain, nasal congestion, sore throat Cardiovascular: No chest pain, palpitations Respiratory:  No shortness of breath at rest or with exertion, wheezes GastrointestinaI: No nausea, vomiting, diarrhea, abdominal pain, fecal incontinence Genitourinary:  No dysuria, urinary retention or frequency Musculoskeletal:  No neck pain, back pain Integumentary: No rash, pruritus, skin lesions Neurological: as above Psychiatric: No depression, insomnia, anxiety Endocrine: No palpitations, fatigue, diaphoresis, mood swings, change in appetite, change in weight, increased thirst Hematologic/Lymphatic:  No purpura, petechiae. Allergic/Immunologic: no itchy/runny eyes, nasal congestion, recent allergic reactions, rashes  PHYSICAL EXAM: Vitals:   06/21/18 1342  BP: 134/70  Pulse: 62  SpO2: 97%   General: No acute distress.  Patient appears well-groomed.   Head:  Normocephalic/atraumatic Eyes:  Fundi examined but not visualized Neck: supple, no paraspinal tenderness, full range of motion Heart:  Regular rate and rhythm Lungs:  Clear to auscultation bilaterally Back: No paraspinal tenderness Neurological Exam: alert and oriented to person, place, and time. Attention span and concentration intact, recent and remote memory intact, fund of knowledge intact.  Speech fluent and not dysarthric, language intact.  CN II-XII intact. Bulk and tone normal, muscle strength 5/5 throughout.  Sensation to light touch intact.  Deep tendon reflexes 2+ throughout, toes downgoing.  Finger to nose testing intact.  Gait steady.  IMPRESSION: 1.  History of traumatic subdural hematoma, status post craniotomy.  She has had an extensive workup by me including MRI of brain, EEG and neuropsychological testing.  She exhibits no cognitive impairment.  Repeat MRI of the brain has been negative for recurrent bleed or new stroke when she previously had worsening speech difficulty.  I think her current symptoms (speech changes,  tremor, memory loss) are due to a conversion disorder related to her underlying stress and anxiety. 2.  Speech dysfunction.  Symptoms fluctuate.  Possibly residual from SDH but I believe is mostly psychogenic. 3.  I do not believe that she ever had a seizure.  PLAN: Follow up in one year or as needed.  21 minutes spent face to face with patient, over 50% spent discussing management.  Metta Clines, DO  CC:  Dr. [name]

## 2018-06-29 ENCOUNTER — Ambulatory Visit: Payer: PPO | Admitting: Neurology

## 2018-09-01 DIAGNOSIS — M1710 Unilateral primary osteoarthritis, unspecified knee: Secondary | ICD-10-CM | POA: Diagnosis not present

## 2018-12-01 DIAGNOSIS — M170 Bilateral primary osteoarthritis of knee: Secondary | ICD-10-CM | POA: Diagnosis not present

## 2019-05-11 DIAGNOSIS — M170 Bilateral primary osteoarthritis of knee: Secondary | ICD-10-CM | POA: Diagnosis not present

## 2019-05-11 DIAGNOSIS — M1712 Unilateral primary osteoarthritis, left knee: Secondary | ICD-10-CM | POA: Diagnosis not present

## 2019-05-11 DIAGNOSIS — M1711 Unilateral primary osteoarthritis, right knee: Secondary | ICD-10-CM | POA: Diagnosis not present

## 2019-06-21 NOTE — Progress Notes (Deleted)
NEUROLOGY FOLLOW UP OFFICE NOTE  Carolyn [name] [id number]  HISTORY OF PRESENT ILLNESS: Carolyn [name] is an 85 year old right-handed female with hypertension, diabetes mellitus, and history of traumatic subdural hematoma who follows up for worsening slurred speech.  She is accompanied by her daughter who supplements history.  UPDATE: Stable.  Speech impairment seems a little worse when she is emotionally stressed or tired.  HISTORY: She sustained a traumatic subdural hematoma on 01/20/16 after hitting her head from a fall when being pulled by her dog during a walk.  CT of head showed small bilateral subdural hematoma.  She was monitored overnight.  Neurosurgery assessed her and didn't feel intervention was warranted.  She was discharged and then developed headaches and slurred speech.  She returned to the ED on 02/06/16.    CT of head revealed left greater than right subdural hematoma with mass effect, requiring evacuation via left craniotomy.  She was started on Keppra 1000mg  twice daily for suspected partial seizure, presenting as transient dysphasia (slurred speech with difficulty talking).  She was also started on topiramate for headache.  In rehab, she exhibited waxing and waning mental status as well as worsening aphasia and right sided weakness.  Repeat CT of head from 02/14/16 and 02/17/16 were stable, revealing unchanged left 6 mm SHD with rightward midline shift.  Due to cognitive decline, she returned to the OR on 02/17/16 and underwent another evacuation.  Postoperatively, she continued to have worsening aphasia and rights sided weakness.  Follow up CT of head revealed reaccumulation of left SDH with mass effect, requiring another evacuation.  At the time, EEG from 02/21/16 was reported to reveal intermittent left central epileptiform discharges.  Later, I looked back and reviewed that EEG myself and it did not show any epileptiform discharges.  Keppra was increased to 1250mg  twice daily.  She  later developed right hand tremor and Keppra was further increased to 1750mg  twice daily.  Due to worsening mood, she was transitioned from [place] to Vimpat.  Mood improved but she developed "thick tongue", causing difficulty speaking.  She also endorsed dizziness consistent with positional vertigo, which was diagnosed in the ED as BPPV.  Thought to be a symptom of Keppra, she subsequently discontinued the Keppra and Vimpat was increased to 75mg  twice daily.  The thick tongue sensation has persisted.  She also feels that she is pocketing her food in the right side of her mouth at times.  She denies new right sided weakness.  Due to sensation of thick tongue after starting Vimpat, she was transitioned to Lamictal.  An MRI of the brain was also performed on 02/10/17 to evaluate for any new stroke or recurrence of SDH, which was stable without acute changes.  During the Lamictal titration, she was exhibiting crying spells and increased dysarthria and thick tongue.  Vimpat was tapered off.  Afterwards, she endorsed improvement in memory and right hand tremor.  However, after a couple of days, she reported worsening speech and mood as well as dizziness and nausea.   She reduced the Lamictal from 100mg  to 75mg .  Due to persistent nausea, episode of vomiting,tongue swelling, and some word-finding difficulties she was advised to go to the ED on 03/24/17.  In ED, her exam was unremarkable except for some mild word-finding difficulties.  She was afebrile and labs revealed no leukocytosis.  UA revealed negative except for small leukocytes, which may have been contaminant.  CMP was unremarkable.  CT of head was personally reviewed  and revealed no acute findings.  Nausea thought to be due to Lamictal or doxycycline (which she had just recently finished).  To see if sensation of thick tongue, memory issues and nausea was symptom of lamotrigine, she was transitioned to zonisamide 100mg  daily.  Nausea was found to be due to  constipation.  She stopped zonisimade several months ago because she felt she didn't need it.   To evaluate short term memory deficits, she underwent neuropsychological testing on 04/08/17, which revealed no evidence of cognitive impairment.     I reviewed the EEG from 02/21/16 and it in fact does not show any epileptiform discharges.  We repeated the EEG on 03/25/17, which revealed mild left hemispheric slowing consistent with prior SDH and subsequent craniotomy but does not reveal any epileptiform discharges.  She and her daughter still report short-term memory problems.    She presented to the ED on 01/08/17 for BPPV.  CT of head demonstrated stable posterior left frontal encephalomalacia and postsurgical changes from craniotomy.  PAST MEDICAL HISTORY: Past Medical History:  Diagnosis Date   Asthma    Cataracts, bilateral    Chronic kidney disease 2007   kidney cancer and partial nephrctomy-right   COPD (chronic obstructive pulmonary disease) ([facility])    Diabetes mellitus    Gallstones    Glaucoma    Hypercholesterolemia    Hypertension    Hypothyroid    PONV (postoperative nausea and vomiting)    Subdural bleeding ([facility])     MEDICATIONS: Current Outpatient Medications on File Prior to Visit  Medication Sig Dispense Refill   acetaminophen (TYLENOL) 500 MG tablet Take 500 mg by mouth every 6 (six) hours as needed for headache (pain).     albuterol (PROVENTIL HFA;VENTOLIN HFA) 108 (90 Base) MCG/ACT inhaler Inhale 1 puff into the lungs every 6 (six) hours as needed for wheezing or shortness of breath.     docusate sodium (ENEMEEZ) 283 MG enema Place 1 enema (283 mg total) rectally daily. (Patient not taking: Reported on 09/21/2017) 30 each 0   Fluticasone-Salmeterol (ADVAIR) 250-50 MCG/DOSE AEPB Inhale 1 puff into the lungs 2 (two) times daily as needed (shortness of breath/ chest tightness).     glimepiride (AMARYL) 1 MG tablet Take 1 mg by mouth at bedtime.      glimepiride (AMARYL) 2 MG tablet Take 2 tablets (4 mg total) by mouth at bedtime. (Patient not taking: Reported on 04/05/2017) 30 tablet 1   latanoprost (XALATAN) 0.005 % ophthalmic solution Place 1 drop into both eyes at bedtime.      lisinopril (PRINIVIL,ZESTRIL) 20 MG tablet Take 1 tablet (20 mg total) by mouth at bedtime. 30 tablet 1   metoprolol tartrate (LOPRESSOR) 25 MG tablet Take 1 tablet (25 mg total) by mouth 2 (two) times daily. (Patient not taking: Reported on 04/05/2017) 60 tablet 1   ondansetron (ZOFRAN ODT) 4 MG disintegrating tablet Take 1 tablet (4 mg total) by mouth every 8 (eight) hours as needed for nausea or vomiting. 20 tablet 0   ondansetron (ZOFRAN) 4 MG tablet Take 1 tablet (4 mg total) by mouth every 8 (eight) hours as needed for nausea or vomiting. 12 tablet 0   pantoprazole (PROTONIX) 40 MG tablet Take 1 tablet (40 mg total) by mouth daily. 30 tablet 1   polyethylene glycol (MIRALAX / GLYCOLAX) packet Take 17 g by mouth daily as needed (constipation). Mix in 8 oz liquid and drink     theophylline (UNIPHYL) 400 MG 24 hr tablet Take 400  mg by mouth at bedtime.      thyroid (ARMOUR) 60 MG tablet Take 60 mg by mouth at bedtime.      verapamil (CALAN-SR) 180 MG CR tablet Take 180 mg by mouth at bedtime.     No current facility-administered medications on file prior to visit.     ALLERGIES: Allergies  Allergen Reactions   Darvocet [Propoxyphene N-Acetaminophen] Nausea And Vomiting   Metformin And Related Diarrhea   Percocet [Oxycodone-Acetaminophen] Nausea And Vomiting   Prednisone Swelling   Topamax [Topiramate] Other (See Comments)    Unable to speak/swallow   Tramadol Other (See Comments)    Masked seizure symptoms   Vimpat [Lacosamide] Swelling    tongue   Vicodin [Hydrocodone-Acetaminophen] Nausea And Vomiting    FAMILY HISTORY: Family History  Problem Relation Age of Onset   Hypertension Mother    Hypertension Father    Breast cancer  Paternal Grandmother    Diabetes Paternal Grandmother    Hypertension Paternal Grandmother    Stroke Paternal Grandmother    Stroke Paternal Grandfather    Heart attack Paternal Grandfather    SOCIAL HISTORY: Social History   Socioeconomic History   Marital status: Married    Spouse name: Not on file   Number of children: Not on file   Years of education: Not on file   Highest education level: Not on file  Occupational History   Not on file  Social Needs   Financial resource strain: Not on file   Food insecurity    Worry: Not on file    Inability: Not on file   Transportation needs    Medical: Not on file    Non-medical: Not on file  Tobacco Use   Smoking status: Former Smoker    Packs/day: 1.00    Years: 5.00    Pack years: 5.00    Types: Cigarettes    Quit date: 01/11/1982    Years since quitting: 37.4   Smokeless tobacco: Never Used   Tobacco comment: quit maybe 69yrs  Substance and Sexual Activity   Alcohol use: No   Drug use: No   Sexual activity: Not on file  Lifestyle   Physical activity    Days per week: Not on file    Minutes per session: Not on file   Stress: Not on file  Relationships   Social connections    Talks on phone: Not on file    Gets together: Not on file    Attends religious service: Not on file    Active member of club or organization: Not on file    Attends meetings of clubs or organizations: Not on file    Relationship status: Not on file   Intimate partner violence    Fear of current or ex partner: Not on file    Emotionally abused: Not on file    Physically abused: Not on file    Forced sexual activity: Not on file  Other Topics Concern   Not on file  Social History Narrative   Not on file    REVIEW OF SYSTEMS: Constitutional: No fevers, chills, or sweats, no generalized fatigue, change in appetite Eyes: No visual changes, double vision, eye pain Ear, nose and throat: No hearing loss, ear pain, nasal  congestion, sore throat Cardiovascular: No chest pain, palpitations Respiratory:  No shortness of breath at rest or with exertion, wheezes GastrointestinaI: No nausea, vomiting, diarrhea, abdominal pain, fecal incontinence Genitourinary:  No dysuria, urinary retention or frequency Musculoskeletal:  No neck pain, back pain Integumentary: No rash, pruritus, skin lesions Neurological: as above Psychiatric: No depression, insomnia, anxiety Endocrine: No palpitations, fatigue, diaphoresis, mood swings, change in appetite, change in weight, increased thirst Hematologic/Lymphatic:  No purpura, petechiae. Allergic/Immunologic: no itchy/runny eyes, nasal congestion, recent allergic reactions, rashes  PHYSICAL EXAM: *** General: No acute distress.  Patient appears ***-groomed.   Head:  Normocephalic/atraumatic Eyes:  Fundi examined but not visualized Neck: supple, no paraspinal tenderness, full range of motion Heart:  Regular rate and rhythm Lungs:  Clear to auscultation bilaterally Back: No paraspinal tenderness Neurological Exam: alert and oriented to person, place, and time. Attention span and concentration intact, recent and remote memory intact, fund of knowledge intact.  Speech fluent and not dysarthric, language intact.  CN II-XII intact. Bulk and tone normal, muscle strength 5/5 throughout.  Sensation to light touch  intact.  Deep tendon reflexes 2+ throughout.  Finger to nose testing intact.  Gait normal, Romberg negative.  IMPRESSION: 1.  History of traumatic subdural hematoma, status post craniotomy.  She has had an extensive workup by me including MRI of brain, EEG and neuropsychological testing.  She exhibits no cognitive impairment.  Repeat MRI of the brain has been negative for recurrent bleed or new stroke when she previously had worsening speech difficulty.  I think her current symptoms (speech changes, tremor, memory loss) are due to a conversion disorder related to her underlying  stress and anxiety. 2.  Speech dysfunction.  Symptoms fluctuate.  Possibly residual from SDH but I believe is mostly psychogenic. 3.  I do not believe that she ever had a seizure.  PLAN: ***  Metta Clines, DO  CC: ***

## 2019-06-22 ENCOUNTER — Ambulatory Visit: Payer: PPO | Admitting: Neurology

## 2019-07-17 DIAGNOSIS — B370 Candidal stomatitis: Secondary | ICD-10-CM | POA: Diagnosis not present

## 2019-07-17 DIAGNOSIS — E1169 Type 2 diabetes mellitus with other specified complication: Secondary | ICD-10-CM | POA: Diagnosis not present

## 2019-07-17 DIAGNOSIS — K219 Gastro-esophageal reflux disease without esophagitis: Secondary | ICD-10-CM | POA: Diagnosis not present

## 2019-07-17 DIAGNOSIS — Z0000 Encounter for general adult medical examination without abnormal findings: Secondary | ICD-10-CM | POA: Diagnosis not present

## 2019-07-17 DIAGNOSIS — Z1389 Encounter for screening for other disorder: Secondary | ICD-10-CM | POA: Diagnosis not present

## 2019-08-10 DIAGNOSIS — M170 Bilateral primary osteoarthritis of knee: Secondary | ICD-10-CM | POA: Diagnosis not present

## 2019-08-10 DIAGNOSIS — M25561 Pain in right knee: Secondary | ICD-10-CM | POA: Diagnosis not present

## 2019-10-03 ENCOUNTER — Telehealth: Payer: Self-pay | Admitting: Neurology

## 2019-10-03 NOTE — Telephone Encounter (Signed)
Patient called to report concerns about popping in her right ear that has been going on for awhile. Patient is open to doing a telephone visit about the problem, if Dr. Tomi Likens is comfortable with this. Patient does not have equipment for a virtual visit.

## 2019-10-03 NOTE — Telephone Encounter (Signed)
Spoke with patient she was informed to contact PCP.  She declines and refused to contact PCP she states he does no anything she will call somewhere else.

## 2019-10-03 NOTE — Telephone Encounter (Signed)
This should be addressed by her PCP.  It does not sound neurological.  May be eustachian tube dysfunction.

## 2019-10-03 NOTE — Telephone Encounter (Signed)
Is this address by you or PCP? Pt requesting visit

## 2019-11-02 ENCOUNTER — Encounter

## 2019-11-22 DIAGNOSIS — K219 Gastro-esophageal reflux disease without esophagitis: Secondary | ICD-10-CM | POA: Diagnosis not present

## 2019-11-22 DIAGNOSIS — E1169 Type 2 diabetes mellitus with other specified complication: Secondary | ICD-10-CM | POA: Diagnosis not present

## 2019-11-22 DIAGNOSIS — I10 Essential (primary) hypertension: Secondary | ICD-10-CM | POA: Diagnosis not present

## 2020-01-18 DIAGNOSIS — M1711 Unilateral primary osteoarthritis, right knee: Secondary | ICD-10-CM | POA: Diagnosis not present

## 2020-01-18 DIAGNOSIS — M1712 Unilateral primary osteoarthritis, left knee: Secondary | ICD-10-CM | POA: Diagnosis not present

## 2020-01-18 DIAGNOSIS — M170 Bilateral primary osteoarthritis of knee: Secondary | ICD-10-CM | POA: Diagnosis not present

## 2020-03-20 ENCOUNTER — Ambulatory Visit
Admission: RE | Admit: 2020-03-20 | Discharge: 2020-03-20 | Disposition: A | Discharge status: Home / Self Care | Payer: PPO | Source: Ambulatory Visit | Attending: Geriatric Medicine | Admitting: Geriatric Medicine

## 2020-03-20 ENCOUNTER — Other Ambulatory Visit: Payer: Self-pay | Admitting: Geriatric Medicine

## 2020-03-20 DIAGNOSIS — R059 Cough, unspecified: Secondary | ICD-10-CM

## 2020-03-20 DIAGNOSIS — R634 Abnormal weight loss: Secondary | ICD-10-CM | POA: Diagnosis not present

## 2020-03-20 DIAGNOSIS — R05 Cough: Secondary | ICD-10-CM

## 2020-03-20 DIAGNOSIS — K146 Glossodynia: Secondary | ICD-10-CM | POA: Diagnosis not present

## 2020-03-20 DIAGNOSIS — Z79899 Other long term (current) drug therapy: Secondary | ICD-10-CM | POA: Diagnosis not present

## 2020-03-20 DIAGNOSIS — I10 Essential (primary) hypertension: Secondary | ICD-10-CM | POA: Diagnosis not present

## 2020-04-04 DIAGNOSIS — I10 Essential (primary) hypertension: Secondary | ICD-10-CM | POA: Diagnosis not present

## 2020-04-04 DIAGNOSIS — K146 Glossodynia: Secondary | ICD-10-CM | POA: Diagnosis not present

## 2020-04-04 DIAGNOSIS — E039 Hypothyroidism, unspecified: Secondary | ICD-10-CM | POA: Diagnosis not present

## 2020-04-04 DIAGNOSIS — Z8679 Personal history of other diseases of the circulatory system: Secondary | ICD-10-CM | POA: Diagnosis not present

## 2020-04-04 DIAGNOSIS — E1169 Type 2 diabetes mellitus with other specified complication: Secondary | ICD-10-CM | POA: Diagnosis not present

## 2020-04-04 DIAGNOSIS — K219 Gastro-esophageal reflux disease without esophagitis: Secondary | ICD-10-CM | POA: Diagnosis not present

## 2020-04-04 DIAGNOSIS — R21 Rash and other nonspecific skin eruption: Secondary | ICD-10-CM | POA: Diagnosis not present

## 2020-04-18 ENCOUNTER — Telehealth: Payer: Self-pay | Admitting: Neurology

## 2020-04-18 DIAGNOSIS — M170 Bilateral primary osteoarthritis of knee: Secondary | ICD-10-CM | POA: Diagnosis not present

## 2020-04-18 NOTE — Telephone Encounter (Signed)
I haven't seen her in almost 2 years.  She can be put on the waitlist but I defer to her PCP's assessment until then.

## 2020-04-18 NOTE — Telephone Encounter (Signed)
Patients daughter called. Patient has follow up visit scheduled for 08/20/20, but daughter states the family is having a difficult time handling her. Patients tongue is thick and sensitive, primary didn't know why or what was causing issue. Patient also has a red place on forehead at hairline that goes down to her eyebrow, seems to come and go, but patient is very worried that she has an aneurysm. Daughter also states that patient has really bad mood swings, and is making it difficult for family to care for her. Patient is afraid to drive but still drives and daughter states she shouldn't be driving, but only drives to the salon. Daughter is seeking advice on what to do or if there is any medication patient can take, she thinks if the patient meets with Dr. Tomi Likens that he will be able to calm her down, but the daughter wants to keep it confidential she spoke to Dr. Tomi Likens regarding these matters for fear of the patient lashing out.

## 2020-04-18 NOTE — Telephone Encounter (Signed)
Patient has been placed on the wait list  list

## 2020-04-25 NOTE — Progress Notes (Signed)
NEUROLOGY FOLLOW UP OFFICE NOTE  Carolyn [name] [id number]  HISTORY OF PRESENT ILLNESS: Carolyn [name] is an 86 year old right-handed female with hypertension, diabetes mellitus, and history of traumatic subdural hematoma who follows up for cognitive changes and episodes of slurred speech  She is accompanied by her daughter who supplements history.  UPDATE: She has been experiencing increased stress over the past year.  Her husband had passed away.  She is now living with her daughters.  She doesn't get along well with one of her daughters.  She is easily agitated.  She sometimes has these "shaking spells" in which she gets weak and shakes.  No loss of consciousness.  Attributes it to low blood sugar.  She will drink a Boost and feel better.  She is nervous about her finances as she quit her job.  She reports fear driving because she is afraid it will happen in the car.  She only drives to the salon.  Her speech is again thick.  She developed burning tongue syndrome. It typically only bothers her with eating and drinking.  Nystatin and gabapentin ineffective.    HISTORY: She sustained a traumatic subdural hematoma on 01/20/16 after hitting her head from a fall when being pulled by her dog during a walk.  CT of head showed small bilateral subdural hematoma.  She was monitored overnight.  Neurosurgery assessed her and didn't feel intervention was warranted.  She was discharged and then developed headaches and slurred speech.  She returned to the ED on 02/06/16.    CT of head revealed left greater than right subdural hematoma with mass effect, requiring evacuation via left craniotomy.  She was started on Keppra 1000mg  twice daily for suspected partial seizure, presenting as transient dysphasia (slurred speech with difficulty talking).  She was also started on topiramate for headache.  In rehab, she exhibited waxing and waning mental status as well as worsening aphasia and right sided weakness.  Repeat CT of  head from 02/14/16 and 02/17/16 were stable, revealing unchanged left 6 mm SHD with rightward midline shift.  Due to cognitive decline, she returned to the OR on 02/17/16 and underwent another evacuation.  Postoperatively, she continued to have worsening aphasia and rights sided weakness.  Follow up CT of head revealed reaccumulation of left SDH with mass effect, requiring another evacuation.  At the time, EEG from 02/21/16 was reported to reveal intermittent left central epileptiform discharges.  Later, I looked back and reviewed that EEG myself and it did not show any epileptiform discharges.  Keppra was increased to 1250mg  twice daily.  She later developed right hand tremor and Keppra was further increased to 1750mg  twice daily.  Due to worsening mood, she was transitioned from Bertie to Vimpat.  Mood improved but she developed "thick tongue", causing difficulty speaking.  She also endorsed dizziness consistent with positional vertigo, which was diagnosed in the ED as BPPV.  Thought to be a symptom of Keppra, she subsequently discontinued the Keppra and Vimpat was increased to 75mg  twice daily.  The thick tongue sensation has persisted.  She also feels that she is pocketing her food in the right side of her mouth at times.  She denies new right sided weakness.  Due to sensation of "thick tongue" after starting Vimpat, she was transitioned to Lamictal.  An MRI of the brain was also performed on 02/10/17 to evaluate for any new stroke or recurrence of SDH, which was stable without acute changes.  During the Lamictal  titration, she was exhibiting crying spells and increased dysarthria and "thick tongue".  Vimpat was tapered off.  Afterwards, she endorsed improvement in memory and right hand tremor.  However, after a couple of days, she reported worsening speech and mood as well as dizziness and nausea.   She reduced the Lamictal from 100mg  to 75mg .  Due to persistent nausea, episode of vomiting,"tongue swelling", and  some word-finding difficulties she was advised to go to the ED on 03/24/17.  In ED, her exam was unremarkable except for some mild word-finding difficulties.  She was afebrile and labs revealed no leukocytosis.  UA revealed negative except for small leukocytes, which may have been contaminant.  CMP was unremarkable.  CT of head was personally reviewed and revealed no acute findings.  Nausea thought to be due to Lamictal or doxycycline (which she had just recently finished).  To see if sensation of "thick tongue", memory issues and nausea was symptom of lamotrigine, she was transitioned to zonisamide 100mg  daily.  Nausea was found to be due to constipation.  She stopped zonisimade several months ago because she felt she didn't need it.   To evaluate short term memory deficits, she underwent neuropsychological testing on 04/08/17, which revealed no evidence of cognitive impairment.     I reviewed the EEG from 02/21/16 and it in fact does not show any epileptiform discharges.  We repeated the EEG on 03/25/17, which revealed mild left hemispheric slowing consistent with prior SDH and subsequent craniotomy but does not reveal any epileptiform discharges.  She and her daughter still report short-term memory problems.    She presented to the ED on 01/08/17 for BPPV.  CT of head demonstrated stable posterior left frontal encephalomalacia and postsurgical changes from craniotomy.  PAST MEDICAL HISTORY: Past Medical History:  Diagnosis Date  . Asthma   . Cataracts, bilateral   . Chronic kidney disease 2007   kidney cancer and partial nephrctomy-right  . COPD (chronic obstructive pulmonary disease) ([place])   . Diabetes mellitus   . Gallstones   . Glaucoma   . Hypercholesterolemia   . Hypertension   . Hypothyroid   . PONV (postoperative nausea and vomiting)   . Subdural bleeding [facility])     MEDICATIONS: Current Outpatient Medications on File Prior to Visit  Medication Sig Dispense Refill  . acetaminophen  (TYLENOL) 500 MG tablet Take 500 mg by mouth every 6 (six) hours as needed for headache (pain).    [name] albuterol (PROVENTIL HFA;VENTOLIN HFA) 108 (90 Base) MCG/ACT inhaler Inhale 1 puff into the lungs every 6 (six) hours as needed for wheezing or shortness of breath.    . docusate sodium (ENEMEEZ) 283 MG enema Place 1 enema (283 mg total) rectally daily. (Patient not taking: Reported on 09/21/2017) 30 each 0  . Fluticasone-Salmeterol (ADVAIR) 250-50 MCG/DOSE AEPB Inhale 1 puff into the lungs 2 (two) times daily as needed (shortness of breath/ chest tightness).    [name] glimepiride (AMARYL) 1 MG tablet Take 1 mg by mouth at bedtime.    [name] glimepiride (AMARYL) 2 MG tablet Take 2 tablets (4 mg total) by mouth at bedtime. (Patient not taking: Reported on 04/05/2017) 30 tablet 1  . latanoprost (XALATAN) 0.005 % ophthalmic solution Place 1 drop into both eyes at bedtime.     [name] lisinopril (PRINIVIL,ZESTRIL) 20 MG tablet Take 1 tablet (20 mg total) by mouth at bedtime. 30 tablet 1  . metoprolol tartrate (LOPRESSOR) 25 MG tablet Take 1 tablet (25 mg total) by mouth 2 (  two) times daily. (Patient not taking: Reported on 04/05/2017) 60 tablet 1  . ondansetron (ZOFRAN ODT) 4 MG disintegrating tablet Take 1 tablet (4 mg total) by mouth every 8 (eight) hours as needed for nausea or vomiting. 20 tablet 0  . ondansetron (ZOFRAN) 4 MG tablet Take 1 tablet (4 mg total) by mouth every 8 (eight) hours as needed for nausea or vomiting. 12 tablet 0  . pantoprazole (PROTONIX) 40 MG tablet Take 1 tablet (40 mg total) by mouth daily. 30 tablet 1  . polyethylene glycol (MIRALAX / GLYCOLAX) packet Take 17 g by mouth daily as needed (constipation). Mix in 8 oz liquid and drink    . theophylline (UNIPHYL) 400 MG 24 hr tablet Take 400 mg by mouth at bedtime.     [name] thyroid (ARMOUR) 60 MG tablet Take 60 mg by mouth at bedtime.     . verapamil (CALAN-SR) 180 MG CR tablet Take 180 mg by mouth at bedtime.     No current facility-administered  medications on file prior to visit.    ALLERGIES: Allergies  Allergen Reactions  . Darvocet [Propoxyphene N-Acetaminophen] Nausea And Vomiting  . Metformin And Related Diarrhea  . Percocet [Oxycodone-Acetaminophen] Nausea And Vomiting  . Prednisone Swelling  . Topamax [Topiramate] Other (See Comments)    Unable to speak/swallow  . Tramadol Other (See Comments)    Masked seizure symptoms  . Vimpat [Lacosamide] Swelling    tongue  . Vicodin [Hydrocodone-Acetaminophen] Nausea And Vomiting    FAMILY HISTORY: Family History  Problem Relation Age of Onset  . Hypertension Mother   . Hypertension Father   . Breast cancer Paternal Grandmother   . Diabetes Paternal Grandmother   . Hypertension Paternal Grandmother   . Stroke Paternal Grandmother   . Stroke Paternal Grandfather   . Heart attack Paternal Grandfather    SOCIAL HISTORY: Social History   Socioeconomic History  . Marital status: Married    Spouse name: Not on file  . Number of children: Not on file  . Years of education: Not on file  . Highest education level: Not on file  Occupational History  . Not on file  Tobacco Use  . Smoking status: Former Smoker    Packs/day: 1.00    Years: 5.00    Pack years: 5.00    Types: Cigarettes    Quit date: 01/11/1982    Years since quitting: 38.3  . Smokeless tobacco: Never Used  . Tobacco comment: quit maybe 22yrs  Vaping Use  . Vaping Use: Never used  Substance and Sexual Activity  . Alcohol use: No  . Drug use: No  . Sexual activity: Not on file  Other Topics Concern  . Not on file  Social History Narrative  . Not on file   Social Determinants of Health   Financial Resource Strain:   . Difficulty of Paying Living Expenses:   Food Insecurity:   . Worried About Charity fundraiser in the Last Year:   . Arboriculturist in the Last Year:   Transportation Needs:   . Film/video editor (Medical):   [name] Lack of Transportation (Non-Medical):   Physical Activity:    . Days of Exercise per Week:   . Minutes of Exercise per Session:   Stress:   . Feeling of Stress :   Social Connections:   . Frequency of Communication with Friends and Family:   . Frequency of Social Gatherings with Friends and Family:   . Attends  Religious Services:   . Active Member of Clubs or Organizations:   . Attends Archivist Meetings:   [name] Marital Status:   Intimate Partner Violence:   . Fear of Current or Ex-Partner:   . Emotionally Abused:   [name] Physically Abused:   . Sexually Abused:     PHYSICAL EXAM: Blood pressure 135/85, pulse 62, height 5\' 6"  (1.676 m), weight 172 lb (78 kg), last menstrual period 03/24/2010, SpO2 98 %. General: No acute distress.  Patient appears well-groomed.   Head:  Normocephalic/atraumatic Eyes:  Fundi examined but not visualized Neck: supple, no paraspinal tenderness, full range of motion Heart:  Regular rate and rhythm Lungs:  Clear to auscultation bilaterally Back: No paraspinal tenderness Neurological Exam: alert and oriented to person, place, and time. Attention span and concentration intact, recent and remote memory intact, fund of knowledge intact.  Speech fluent and not dysarthric, language intact.  CN II-XII intact. Bulk and tone normal, muscle strength 5/5 throughout.  Sensation to light touch intact.  Deep tendon reflexes 2+ throughout, toes downgoing.  Finger to nose testing intact.  Steady gait  IMPRESSION: I think her symptoms are primarily related to her depression and anxiety.  I recommended that she talk to a therapist to get incite and advice (such as how to address her daughter).  She may benefit from an antidepressant as well.  Follow up with me as needed.  Metta Clines, DO  CC: Lajean Manes, MD

## 2020-04-29 ENCOUNTER — Encounter: Payer: Self-pay | Admitting: Neurology

## 2020-04-29 ENCOUNTER — Ambulatory Visit: Payer: PPO | Admitting: Neurology

## 2020-04-29 ENCOUNTER — Other Ambulatory Visit: Payer: Self-pay

## 2020-04-29 VITALS — BP 135/85 | HR 62 | Ht 66.0 in | Wt 172.0 lb

## 2020-04-29 DIAGNOSIS — F419 Anxiety disorder, unspecified: Secondary | ICD-10-CM

## 2020-04-29 NOTE — Patient Instructions (Signed)
I think the stress is causing the majority of these symptoms.  I would recommend treatment for your stress.  I think you may benefit talking to a therapist.

## 2020-05-02 DIAGNOSIS — L720 Epidermal cyst: Secondary | ICD-10-CM | POA: Diagnosis not present

## 2020-05-02 DIAGNOSIS — L738 Other specified follicular disorders: Secondary | ICD-10-CM | POA: Diagnosis not present

## 2020-05-02 DIAGNOSIS — L723 Sebaceous cyst: Secondary | ICD-10-CM | POA: Diagnosis not present

## 2020-05-02 DIAGNOSIS — L821 Other seborrheic keratosis: Secondary | ICD-10-CM | POA: Diagnosis not present

## 2020-05-02 DIAGNOSIS — L718 Other rosacea: Secondary | ICD-10-CM | POA: Diagnosis not present

## 2020-05-13 DIAGNOSIS — K146 Glossodynia: Secondary | ICD-10-CM | POA: Diagnosis not present

## 2020-05-13 DIAGNOSIS — R0789 Other chest pain: Secondary | ICD-10-CM | POA: Diagnosis not present

## 2020-05-13 DIAGNOSIS — D1722 Benign lipomatous neoplasm of skin and subcutaneous tissue of left arm: Secondary | ICD-10-CM | POA: Diagnosis not present

## 2020-05-13 DIAGNOSIS — L723 Sebaceous cyst: Secondary | ICD-10-CM | POA: Diagnosis not present

## 2020-05-13 DIAGNOSIS — J4520 Mild intermittent asthma, uncomplicated: Secondary | ICD-10-CM | POA: Diagnosis not present

## 2020-05-30 DIAGNOSIS — E119 Type 2 diabetes mellitus without complications: Secondary | ICD-10-CM | POA: Diagnosis not present

## 2020-05-30 DIAGNOSIS — H5203 Hypermetropia, bilateral: Secondary | ICD-10-CM | POA: Diagnosis not present

## 2020-05-30 DIAGNOSIS — Z9842 Cataract extraction status, left eye: Secondary | ICD-10-CM | POA: Diagnosis not present

## 2020-05-30 DIAGNOSIS — Z9841 Cataract extraction status, right eye: Secondary | ICD-10-CM | POA: Diagnosis not present

## 2020-05-30 DIAGNOSIS — H52223 Regular astigmatism, bilateral: Secondary | ICD-10-CM | POA: Diagnosis not present

## 2020-06-17 DIAGNOSIS — I10 Essential (primary) hypertension: Secondary | ICD-10-CM | POA: Diagnosis not present

## 2020-06-17 DIAGNOSIS — Z961 Presence of intraocular lens: Secondary | ICD-10-CM | POA: Diagnosis not present

## 2020-06-17 DIAGNOSIS — H40013 Open angle with borderline findings, low risk, bilateral: Secondary | ICD-10-CM | POA: Diagnosis not present

## 2020-06-17 DIAGNOSIS — E119 Type 2 diabetes mellitus without complications: Secondary | ICD-10-CM | POA: Diagnosis not present

## 2020-07-01 DIAGNOSIS — R002 Palpitations: Secondary | ICD-10-CM | POA: Diagnosis not present

## 2020-07-26 DIAGNOSIS — M25561 Pain in right knee: Secondary | ICD-10-CM | POA: Diagnosis not present

## 2020-07-26 DIAGNOSIS — M170 Bilateral primary osteoarthritis of knee: Secondary | ICD-10-CM | POA: Diagnosis not present

## 2020-08-10 NOTE — Progress Notes (Deleted)
Cardiology Office Note:    Date:  08/10/2020   ID:  Carolyn [name], DOB Jan 23, 1933, MRN [id number]  PCP:  Lajean Manes, MD  Cardiologist:  No primary care provider on file.   Referring MD: Lajean Manes, MD   No chief complaint on file.   History of Present Illness:    Carolyn [name] is a 86 y.o. female with a hx of  H/o PVC's, and bradycardia. Last seen 2017.  ***  Past Medical History:  Diagnosis Date  . Asthma   . Cataracts, bilateral   . Chronic kidney disease 2007   kidney cancer and partial nephrctomy-right  . COPD (chronic obstructive pulmonary disease) ([place])   . Diabetes mellitus   . Gallstones   . Glaucoma   . Hypercholesterolemia   . Hypertension   . Hypothyroid   . PONV (postoperative nausea and vomiting)   . Subdural bleeding [facility])     Past Surgical History:  Procedure Laterality Date  . ABDOMINAL HYSTERECTOMY    . BREAST BIOPSY    . Cateract extractions and IOL    . CHOLECYSTECTOMY  2008  . CRANIOTOMY Left 02/06/2016   Procedure: CRANIOTOMY HEMATOMA EVACUATION SUBDURAL;  Surgeon: Kevan Ny Ditty, MD;  Location: [facility];  Service: Neurosurgery;  Laterality: Left;  . CRANIOTOMY N/A 02/17/2016   Procedure: CRANIOTOMY HEMATOMA EVACUATION SUBDURAL;  Surgeon: Kevan Ny Ditty, MD;  Location: [facility] NEURO OR[facility];  Service: Neurosurgery;  Laterality: N/A;  left  . CRANIOTOMY Left 02/18/2016   Procedure: CRANIOTOMY HEMATOMA EVACUATION SUBDURAL;  Surgeon: Earnie Larsson, MD;  Location: [facility]UR[facility] ORS;  Service: Neurosurgery;  Laterality: Left;  . D&C for enlarged uterine  210  . DILATION AND CURETTAGE OF UTERUS    . EYE SURGERY  08/2011,09/2012   bilateral cataracts  . HERNIA REPAIR  01/19/12   inguinal  . INGUINAL HERNIA REPAIR  01/19/2012   Procedure: HERNIA REPAIR INGUINAL ADULT;  Surgeon: Earnstine Regal, MD;  Location: [facility] ORS;  Service: General;  Laterality: Left;  repair left inguinal hernia with mesh   . PARTIAL NEPHRECTOMY     cancer  . TOTAL ABDOMINAL  HYSTERECTOMY W/ BILATERAL SALPINGOOPHORECTOMY  2011    Current Medications: No outpatient medications have been marked as taking for the 08/15/20 encounter (Appointment) with Belva Crome, MD.     Allergies:   Darvocet [propoxyphene n-acetaminophen], Metformin and related, Percocet [oxycodone-acetaminophen], Prednisone, Topamax [topiramate], Tramadol, Vimpat [lacosamide], and Vicodin [hydrocodone-acetaminophen]   Social History   Socioeconomic History  . Marital status: Married    Spouse name: Not on file  . Number of children: Not on file  . Years of education: Not on file  . Highest education level: Not on file  Occupational History  . Not on file  Tobacco Use  . Smoking status: Former Smoker    Packs/day: 1.00    Years: 5.00    Pack years: 5.00    Types: Cigarettes    Quit date: 01/11/1982    Years since quitting: 38.6  . Smokeless tobacco: Never Used  . Tobacco comment: quit maybe 34yrs  Vaping Use  . Vaping Use: Never used  Substance and Sexual Activity  . Alcohol use: No  . Drug use: No  . Sexual activity: Not on file  Other Topics Concern  . Not on file  Social History Narrative  . Not on file   Social Determinants of Health   Financial Resource Strain:   . Difficulty of Paying Living Expenses: Not on  file  Food Insecurity:   . Worried About Charity fundraiser in the Last Year: Not on file  . Ran Out of Food in the Last Year: Not on file  Transportation Needs:   . Lack of Transportation (Medical): Not on file  . Lack of Transportation (Non-Medical): Not on file  Physical Activity:   . Days of Exercise per Week: Not on file  . Minutes of Exercise per Session: Not on file  Stress:   . Feeling of Stress : Not on file  Social Connections:   . Frequency of Communication with Friends and Family: Not on file  . Frequency of Social Gatherings with Friends and Family: Not on file  . Attends Religious Services: Not on file  . Active Member of Clubs or  Organizations: Not on file  . Attends Archivist Meetings: Not on file  . Marital Status: Not on file     Family History: The patient's family history includes Breast cancer in her paternal grandmother; Diabetes in her paternal grandmother; Heart attack in her paternal grandfather; Hypertension in her father, mother, and paternal grandmother; Stroke in her paternal grandfather and paternal grandmother.  ROS:   Please see the history of present illness.    *** All other systems reviewed and are negative.  EKGs/Labs/Other Studies Reviewed:    The following studies were reviewed today:  ECHOCARDIOGRAPHY 02/21/2016 -------------------------------------------------------------------  Study Conclusions   - Left ventricle: The cavity size was normal. Wall thickness was  normal. Systolic function was normal. The estimated ejection  fraction was in the range of 55% to 60%. Wall motion was normal;  there were no regional wall motion abnormalities. Doppler  parameters are consistent with abnormal left ventricular  relaxation (grade 1 diastolic dysfunction). Doppler parameters  are consistent with high ventricular filling pressure.  - Aortic valve: There was trivial regurgitation.  - Mitral valve: There was mild regurgitation.  - Left atrium: The atrium was mildly dilated.  - Pulmonary arteries: Systolic pressure was mildly increased. PA  peak pressure: 48 mm Hg (S).   Impressions:   - Normal LV systolic function; grade 1 diastolic dysfunction with  elevated LV filling pressure; mild LAE; trace AI; mild MR; trace  TR with mildly elevated pulmonary pressure.   EKG:  EKG ***  Recent Labs: No results found for requested labs within last 8760 hours.  Recent Lipid Panel    Component Value Date/Time   CHOL 155 01/21/2016 0511   TRIG 50 01/21/2016 0511   HDL 59 01/21/2016 0511   CHOLHDL 2.6 01/21/2016 0511   VLDL 10 01/21/2016 0511   LDLCALC 86 01/21/2016  0511    Physical Exam:    VS:  LMP 03/24/2010     Wt Readings from Last 3 Encounters:  04/29/20 172 lb (78 kg)  06/21/18 200 lb (90.7 kg)  12/27/17 207 lb 3.2 oz (94 kg)     GEN: ***. No acute distress HEENT: Normal NECK: No JVD. LYMPHATICS: No lymphadenopathy CARDIAC: *** RRR without murmur, gallop, or edema. VASCULAR: *** Normal Pulses. No bruits. RESPIRATORY:  Clear to auscultation without rales, wheezing or rhonchi  ABDOMEN: Soft, non-tender, non-distended, No pulsatile mass, MUSCULOSKELETAL: No deformity  SKIN: Warm and dry NEUROLOGIC:  Alert and oriented x 3 PSYCHIATRIC:  Normal affect   ASSESSMENT:    1. Essential hypertension   2. Hypercholesterolemia   3. Diabetes mellitus without complication ([place])   4. Other emphysema ([place])   5. SDH (subdural hematoma) ([facility])  84. Educated about COVID-19 virus infection    PLAN:    In order of problems listed above:  1. ***   Medication Adjustments/Labs and Tests Ordered: Current medicines are reviewed at length with the patient today.  Concerns regarding medicines are outlined above.  No orders of the defined types were placed in this encounter.  No orders of the defined types were placed in this encounter.   There are no Patient Instructions on file for this visit.   Signed, Sinclair Grooms, MD  08/10/2020 7:30 PM    [place]

## 2020-08-15 ENCOUNTER — Ambulatory Visit: Payer: PPO | Admitting: Interventional Cardiology

## 2020-08-15 DIAGNOSIS — R197 Diarrhea, unspecified: Secondary | ICD-10-CM | POA: Diagnosis not present

## 2020-08-20 ENCOUNTER — Ambulatory Visit: Payer: PPO | Admitting: Neurology

## 2020-08-23 ENCOUNTER — Encounter: Payer: Self-pay | Admitting: General Practice

## 2020-08-23 ENCOUNTER — Ambulatory Visit: Payer: PPO | Admitting: Interventional Cardiology

## 2020-08-26 DIAGNOSIS — R197 Diarrhea, unspecified: Secondary | ICD-10-CM | POA: Diagnosis not present

## 2020-09-13 DIAGNOSIS — Z23 Encounter for immunization: Secondary | ICD-10-CM | POA: Diagnosis not present

## 2020-09-17 DIAGNOSIS — K146 Glossodynia: Secondary | ICD-10-CM | POA: Diagnosis not present

## 2020-10-03 ENCOUNTER — Other Ambulatory Visit: Payer: Self-pay | Admitting: Internal Medicine

## 2020-10-03 ENCOUNTER — Ambulatory Visit
Admission: RE | Admit: 2020-10-03 | Discharge: 2020-10-03 | Disposition: A | Discharge status: Home / Self Care | Payer: PPO | Source: Ambulatory Visit | Attending: Internal Medicine | Admitting: Internal Medicine

## 2020-10-03 DIAGNOSIS — M542 Cervicalgia: Secondary | ICD-10-CM | POA: Diagnosis not present

## 2020-10-03 DIAGNOSIS — R634 Abnormal weight loss: Secondary | ICD-10-CM | POA: Diagnosis not present

## 2020-10-03 DIAGNOSIS — R269 Unspecified abnormalities of gait and mobility: Secondary | ICD-10-CM | POA: Diagnosis not present

## 2020-10-08 ENCOUNTER — Telehealth: Payer: Self-pay | Admitting: Adult Health Nurse Practitioner

## 2020-10-08 DIAGNOSIS — N390 Urinary tract infection, site not specified: Secondary | ICD-10-CM | POA: Diagnosis not present

## 2020-10-08 NOTE — Telephone Encounter (Signed)
Called daughter back, after speaking with the Palliative NP, to schedule a visit - no answer, left message requesting a return call to schedule visit.  Left my name and contact number.

## 2020-10-08 NOTE — Telephone Encounter (Signed)
Returned call to daughter, [name] and told her that Palliative NP could see patient tomorrow, 10/09/20 @ 12:30 PM and she was in agreement with this.

## 2020-10-08 NOTE — Telephone Encounter (Signed)
Spoke with patient's daughter, [name], regarding Palliative referral/services and all questions were answered.  She was in agreement with scheduling a visit and has requested a visit today.  Explained to daughter that the NP already has patients scheduled for today and that I would need to speak with her to see when she could work her in and that I would call her back and daughter was in agreement with this.

## 2020-10-09 ENCOUNTER — Other Ambulatory Visit: Payer: Self-pay

## 2020-10-09 ENCOUNTER — Other Ambulatory Visit: Payer: PPO | Admitting: Adult Health Nurse Practitioner

## 2020-10-09 DIAGNOSIS — E119 Type 2 diabetes mellitus without complications: Secondary | ICD-10-CM

## 2020-10-09 DIAGNOSIS — F09 Unspecified mental disorder due to known physiological condition: Secondary | ICD-10-CM

## 2020-10-09 DIAGNOSIS — S0990XS Unspecified injury of head, sequela: Secondary | ICD-10-CM | POA: Diagnosis not present

## 2020-10-09 DIAGNOSIS — I10 Essential (primary) hypertension: Secondary | ICD-10-CM

## 2020-10-09 DIAGNOSIS — J438 Other emphysema: Secondary | ICD-10-CM

## 2020-10-09 NOTE — Progress Notes (Signed)
[facility] Consult Note Telephone: [phone]  Fax: [phone]  PATIENT NAME: Carolyn [name] DOB: 84-Jan-1934 MRN: [id number]  PRIMARY CARE PROVIDER:   Lajean Manes, MD  REFERRING PROVIDER:  Lajean Manes, MD [address] [place],  [place] [address]  RESPONSIBLE PARTY:   Carmin Muskrat, daughter [phone] Carolyn [name], daughter [phone]  Chief complaint: Initial palliative consult    RECOMMENDATIONS and PLAN:  1.  Advanced care planning.  Patient not feeling well today and did not want to have discussion on ACP.  Will defer this in the future visit when she is feeling better  2.  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  Patient does have forgetfulness related to past traumatic brain injury.  She also has labile emotions.  Daughter does state that has been suggested that she see a counseling or a therapist but that her mother does not want this at this time.  We will continue to monitor for readiness for counseling  3.  COPD.  Patient does not use supplemental oxygen.  Does have some increased DOE related to current illness.  Otherwise seems stable.  Continue current COPD medications  4.  DMT2.  Daughter, Carolyn [name], concerned that with patient's weight loss that she may not need current dosages of diabetic medications.  Have instructed that over the next 2 weeks to monitor her blood sugars and to record them to monitor for any changes that may be needed to diabetic regimen  5.  HTN.  Daughter also concerned that with the weight loss that her blood pressure medications may need to be altered as well.  Have advised that over the next 2 weeks to monitor her blood pressure and to record the readings to monitor for any changes that may be needed to her blood pressure regimen  Palliative will continue to monitor for symptom management/decline and make recommendations as needed.  Have follow-up visit in 2 weeks.  Encouraged to call  with any questions or concerns.  Of note though patient is complaining of feeling poorly and is having intermittent fevers there is no underlying concern for infection.  Lung sounds clear no cough noted today.  No indications of sinus infection.  No complaints of dysuria or hematuria and preliminary UA negative for UTI.  Patient not having any nausea or vomiting or diarrhea.  Unsure as to why patient is feeling fatigued and not wanting to eat.  Son does state that she is starting to drink more and have encouraged her to eat more even if just a few bites every couple of hours.  Have also encouraged her to get up at least to sit in her wheelchair daily to help prevent pneumonia from setting in.  I spent 75 minutes providing this consultation. More than 50% of the time in this consultation was spent coordinating communication.    HISTORY OF PRESENT ILLNESS:  Carolyn [name] is a 86 y.o. 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  Patient does have burning tongue that is worse with spicy foods over the past year has tried nystatin and gabapentin which have been ineffective.  Family does state wants to try it water and capsaicin.  Evaluation by neurology feels as if it may also be related to anxiety.  Palliative Care was asked to help address goals of care.  Patient has been having fatigue and decreased appetite for last couple of weeks.  She states that it started with coughing up phlegm which this seems to have resolved.  She gets an intermittent fever which is helped with Tylenol.  Today has a fever of 100.1.  This may be an accurate as temperature was taken via forehead scan and patient has been sitting near window with sunshine on her.  She does feel slightly warm to the touch.  Family states that it was about 2  to 3 weeks ago that she had her Covid booster.  Also about 2 weeks ago patient was having pain to back of neck.  X-rays did show spondylosis of cervical spine with some disc degeneration.  Spoke with daughter, Carolyn [name], via phone who states that she has been using moist heat and Voltaren gel for this.  [name] states that her mother will go back and forth on ideas such as stating that she does need help in the home to not wanting help in the home.  States that this is related to her past brain injury in 02-17-16 in which she hit her head after falling while walking the dog.  Son and daughter, Carolyn [name], present during visit and provide assistance with HPI and ROS.  The patient states that she feels poorly she is unable to provide specific symptoms.  Denies headache, dizziness, sinus pain or pressure, increased shortness of breath or cough, abdominal pain, N/V/D, constipation, chest pain, dysuria, hematuria.  Patient did have urinalysis with preliminary results negative for UTI and awaiting results of C&S. Patient's family states that she has lost approximately 55 pounds over the past couple of years since her husband died in 16-Feb-2018.  Do try to supplement with boost.  Current weight is 163 pounds with BMI of 26.31.  CODE STATUS: see above  PPS: 40% HOSPICE ELIGIBILITY/DIAGNOSIS: TBD  PHYSICAL EXAM:  BP 122/72 HR 67 O2 99% on room air temp 100.1 General: NAD, frail appearing Eyes: Anicteric and noninjected with no discharge noted EN MT: Dry mucous membranes; no maxillary or frontal tenderness noted Cardiovascular: regular rate and rhythm Pulmonary: Lung sounds diminished with no adventitious breath sounds heard; normal respiratory effort Abdomen: soft, nontender, hypoactive bowel sounds Extremities: Trace to 1+ edema noted to feet and ankles, no joint deformities Skin: no rashes on exposed skin Neurological: Patient having generalized weakness; does have forgetfulness related to past brain injury   No family  history of bleeding disorders, wound healing problems or difficulty with anesthesia.    PAST MEDICAL HISTORY:  Past Medical History:  Diagnosis Date  . Asthma   . Cataracts, bilateral   . Chronic kidney disease 2006/02/16   kidney cancer and partial nephrctomy-right  . COPD (chronic obstructive pulmonary disease) ([facility])   . Diabetes mellitus   . Gallstones   . Glaucoma   . Hypercholesterolemia   . Hypertension   . Hypothyroid   . PONV (postoperative nausea and vomiting)   . Subdural bleeding ([facility])     SOCIAL HX:  Social History   Tobacco Use  . Smoking status: Former Smoker    Packs/day: 1.00    Years: 5.00    Pack years: 5.00    Types: Cigarettes    Quit date: 01/11/1982    Years since quitting: 38.7  . Smokeless tobacco: Never Used  . Tobacco comment: quit maybe 66yrs  Substance Use Topics  . Alcohol use: No    ALLERGIES:  Allergies  Allergen Reactions  . Darvocet [Propoxyphene  N-Acetaminophen] Nausea And Vomiting  . Metformin And Related Diarrhea  . Percocet [Oxycodone-Acetaminophen] Nausea And Vomiting  . Prednisone Swelling  . Topamax [Topiramate] Other (See Comments)    Unable to speak/swallow  . Tramadol Other (See Comments)    Masked seizure symptoms  . Vimpat [Lacosamide] Swelling    tongue  . Vicodin [Hydrocodone-Acetaminophen] Nausea And Vomiting     PERTINENT MEDICATIONS:  Outpatient Encounter Medications as of 10/09/2020  Medication Sig  . acetaminophen (TYLENOL) 500 MG tablet Take 500 mg by mouth every 6 (six) hours as needed for headache (pain).  [name] albuterol (PROVENTIL HFA;VENTOLIN HFA) 108 (90 Base) MCG/ACT inhaler Inhale 1 puff into the lungs every 6 (six) hours as needed for wheezing or shortness of breath.  . docusate sodium (ENEMEEZ) 283 MG enema Place 1 enema (283 mg total) rectally daily. (Patient not taking: Reported on 09/21/2017)  . Fluticasone-Salmeterol (ADVAIR) 250-50 MCG/DOSE AEPB Inhale 1 puff into the lungs 2 (two) times daily as needed  (shortness of breath/ chest tightness).  [name] glimepiride (AMARYL) 1 MG tablet Take 1 mg by mouth at bedtime.  [name] glimepiride (AMARYL) 2 MG tablet Take 2 tablets (4 mg total) by mouth at bedtime. (Patient not taking: Reported on 04/05/2017)  . latanoprost (XALATAN) 0.005 % ophthalmic solution Place 1 drop into both eyes at bedtime.   [name] levothyroxine (SYNTHROID) 88 MCG tablet Take 88 mcg by mouth every morning.  [name] lisinopril (PRINIVIL,ZESTRIL) 20 MG tablet Take 1 tablet (20 mg total) by mouth at bedtime.  . metoprolol tartrate (LOPRESSOR) 25 MG tablet Take 1 tablet (25 mg total) by mouth 2 (two) times daily. (Patient not taking: Reported on 04/05/2017)  . ondansetron (ZOFRAN ODT) 4 MG disintegrating tablet Take 1 tablet (4 mg total) by mouth every 8 (eight) hours as needed for nausea or vomiting. (Patient not taking: Reported on 04/29/2020)  . ondansetron (ZOFRAN) 4 MG tablet Take 1 tablet (4 mg total) by mouth every 8 (eight) hours as needed for nausea or vomiting. (Patient not taking: Reported on 04/29/2020)  . pantoprazole (PROTONIX) 40 MG tablet Take 1 tablet (40 mg total) by mouth daily.  . polyethylene glycol (MIRALAX / GLYCOLAX) packet Take 17 g by mouth daily as needed (constipation). Mix in 8 oz liquid and drink  . theophylline (UNIPHYL) 400 MG 24 hr tablet Take 400 mg by mouth at bedtime.   [name] thyroid (ARMOUR) 60 MG tablet Take 60 mg by mouth at bedtime.  (Patient not taking: Reported on 04/29/2020)  . verapamil (CALAN-SR) 180 MG CR tablet Take 180 mg by mouth at bedtime.   No facility-administered encounter medications on file as of 10/09/2020.     Yasmin Bronaugh Jenetta Downer, NP

## 2020-10-11 ENCOUNTER — Emergency Department (HOSPITAL_COMMUNITY): Payer: PPO

## 2020-10-11 ENCOUNTER — Emergency Department (HOSPITAL_COMMUNITY)
Admission: EM | Admit: 2020-10-11 | Discharge: 2020-10-12 | Payer: PPO | Attending: Emergency Medicine | Admitting: Emergency Medicine

## 2020-10-11 ENCOUNTER — Other Ambulatory Visit: Payer: Self-pay

## 2020-10-11 ENCOUNTER — Encounter (HOSPITAL_COMMUNITY): Payer: Self-pay

## 2020-10-11 DIAGNOSIS — E1140 Type 2 diabetes mellitus with diabetic neuropathy, unspecified: Secondary | ICD-10-CM | POA: Insufficient documentation

## 2020-10-11 DIAGNOSIS — Z79899 Other long term (current) drug therapy: Secondary | ICD-10-CM | POA: Diagnosis not present

## 2020-10-11 DIAGNOSIS — J90 Pleural effusion, not elsewhere classified: Secondary | ICD-10-CM | POA: Diagnosis not present

## 2020-10-11 DIAGNOSIS — Z20822 Contact with and (suspected) exposure to covid-19: Secondary | ICD-10-CM | POA: Insufficient documentation

## 2020-10-11 DIAGNOSIS — E039 Hypothyroidism, unspecified: Secondary | ICD-10-CM | POA: Diagnosis not present

## 2020-10-11 DIAGNOSIS — R911 Solitary pulmonary nodule: Secondary | ICD-10-CM | POA: Diagnosis not present

## 2020-10-11 DIAGNOSIS — J449 Chronic obstructive pulmonary disease, unspecified: Secondary | ICD-10-CM | POA: Diagnosis not present

## 2020-10-11 DIAGNOSIS — D696 Thrombocytopenia, unspecified: Secondary | ICD-10-CM | POA: Diagnosis not present

## 2020-10-11 DIAGNOSIS — I129 Hypertensive chronic kidney disease with stage 1 through stage 4 chronic kidney disease, or unspecified chronic kidney disease: Secondary | ICD-10-CM | POA: Insufficient documentation

## 2020-10-11 DIAGNOSIS — N179 Acute kidney failure, unspecified: Secondary | ICD-10-CM | POA: Insufficient documentation

## 2020-10-11 DIAGNOSIS — R0602 Shortness of breath: Secondary | ICD-10-CM | POA: Diagnosis not present

## 2020-10-11 DIAGNOSIS — N189 Chronic kidney disease, unspecified: Secondary | ICD-10-CM | POA: Insufficient documentation

## 2020-10-11 DIAGNOSIS — Z87891 Personal history of nicotine dependence: Secondary | ICD-10-CM | POA: Insufficient documentation

## 2020-10-11 DIAGNOSIS — Z79891 Long term (current) use of opiate analgesic: Secondary | ICD-10-CM | POA: Diagnosis not present

## 2020-10-11 DIAGNOSIS — I700 Atherosclerosis of aorta: Secondary | ICD-10-CM | POA: Diagnosis not present

## 2020-10-11 DIAGNOSIS — Z7984 Long term (current) use of oral hypoglycemic drugs: Secondary | ICD-10-CM | POA: Insufficient documentation

## 2020-10-11 DIAGNOSIS — D72829 Elevated white blood cell count, unspecified: Secondary | ICD-10-CM | POA: Diagnosis not present

## 2020-10-11 DIAGNOSIS — D649 Anemia, unspecified: Secondary | ICD-10-CM | POA: Diagnosis not present

## 2020-10-11 DIAGNOSIS — R7989 Other specified abnormal findings of blood chemistry: Secondary | ICD-10-CM | POA: Diagnosis not present

## 2020-10-11 DIAGNOSIS — I2699 Other pulmonary embolism without acute cor pulmonale: Secondary | ICD-10-CM | POA: Diagnosis not present

## 2020-10-11 DIAGNOSIS — I517 Cardiomegaly: Secondary | ICD-10-CM | POA: Diagnosis not present

## 2020-10-11 HISTORY — DX: Malignant (primary) neoplasm, unspecified: C80.1

## 2020-10-11 LAB — COMPREHENSIVE METABOLIC PANEL
ALT: 52 U/L — ABNORMAL HIGH (ref 0–44)
AST: 51 U/L — ABNORMAL HIGH (ref 15–41)
Albumin: 3.1 g/dL — ABNORMAL LOW (ref 3.5–5.0)
Alkaline Phosphatase: 150 U/L — ABNORMAL HIGH (ref 38–126)
Anion gap: 12 (ref 5–15)
BUN: 25 mg/dL — ABNORMAL HIGH (ref 8–23)
CO2: 23 mmol/L (ref 22–32)
Calcium: 9.7 mg/dL (ref 8.9–10.3)
Chloride: 99 mmol/L (ref 98–111)
Creatinine, Ser: 1.12 mg/dL — ABNORMAL HIGH (ref 0.44–1.00)
GFR, Estimated: 48 mL/min — ABNORMAL LOW (ref >60)
Glucose, Bld: 315 mg/dL — ABNORMAL HIGH (ref 70–99)
Potassium: 3.7 mmol/L (ref 3.5–5.1)
Sodium: 134 mmol/L — ABNORMAL LOW (ref 135–145)
Total Bilirubin: 0.7 mg/dL (ref 0.3–1.2)
Total Protein: 7.9 g/dL (ref 6.5–8.1)

## 2020-10-11 LAB — TSH: TSH: 2.003 u[IU]/mL (ref 0.350–4.500)

## 2020-10-11 LAB — CBC WITH DIFFERENTIAL/PLATELET
Abs Immature Granulocytes: 0 10*3/uL (ref 0.00–0.07)
Basophils Absolute: 0 10*3/uL (ref 0.0–0.1)
Basophils Relative: 0 %
Eosinophils Absolute: 0 10*3/uL (ref 0.0–0.5)
Eosinophils Relative: 0 %
HCT: 22 % — ABNORMAL LOW (ref 36.0–46.0)
Hemoglobin: 7.2 g/dL — ABNORMAL LOW (ref 12.0–15.0)
Lymphocytes Relative: 9 %
Lymphs Abs: 7.5 10*3/uL — ABNORMAL HIGH (ref 0.7–4.0)
MCH: 32 pg (ref 26.0–34.0)
MCHC: 32.7 g/dL (ref 30.0–36.0)
MCV: 97.8 fL (ref 80.0–100.0)
Monocytes Absolute: 5 10*3/uL — ABNORMAL HIGH (ref 0.1–1.0)
Monocytes Relative: 6 %
Neutro Abs: 1.7 10*3/uL (ref 1.7–7.7)
Neutrophils Relative %: 2 %
Other: 83 %
Platelets: 22 10*3/uL — CL (ref 150–400)
RBC: 2.25 MIL/uL — ABNORMAL LOW (ref 3.87–5.11)
RDW: 18 % — ABNORMAL HIGH (ref 11.5–15.5)
WBC: 83.2 10*3/uL (ref 4.0–10.5)
nRBC: 0.3 % — ABNORMAL HIGH (ref 0.0–0.2)

## 2020-10-11 LAB — URINALYSIS, ROUTINE W REFLEX MICROSCOPIC
Bilirubin Urine: NEGATIVE
Glucose, UA: NEGATIVE mg/dL
Hgb urine dipstick: NEGATIVE
Ketones, ur: NEGATIVE mg/dL
Leukocytes,Ua: NEGATIVE
Nitrite: NEGATIVE
Protein, ur: NEGATIVE mg/dL
Specific Gravity, Urine: 1.012 (ref 1.005–1.030)
pH: 6 (ref 5.0–8.0)

## 2020-10-11 LAB — RESP PANEL BY RT-PCR (FLU A&B, COVID) ARPGX2
Influenza A by PCR: NEGATIVE
Influenza B by PCR: NEGATIVE
SARS Coronavirus 2 by RT PCR: NEGATIVE

## 2020-10-11 LAB — URIC ACID: Uric Acid, Serum: 6.6 mg/dL (ref 2.5–7.1)

## 2020-10-11 LAB — MAGNESIUM: Magnesium: 1.8 mg/dL (ref 1.7–2.4)

## 2020-10-11 LAB — D-DIMER, QUANTITATIVE: D-Dimer, Quant: 6.11 ug/mL-FEU — ABNORMAL HIGH (ref 0.00–0.50)

## 2020-10-11 MED ORDER — SODIUM CHLORIDE (PF) 0.9 % IJ SOLN
INTRAMUSCULAR | Status: AC
  Filled 2020-10-11: qty 50

## 2020-10-11 MED ORDER — HYDROXYUREA 500 MG PO CAPS
2000.0000 mg | ORAL_CAPSULE | Freq: Three times a day (TID) | ORAL | Status: DC
  Administered 2020-10-11: 2000 mg via ORAL
  Filled 2020-10-11 (×2): qty 4

## 2020-10-11 MED ORDER — IOHEXOL 350 MG/ML SOLN
60.0000 mL | Freq: Once | INTRAVENOUS | Status: AC | PRN
  Administered 2020-10-11: 60 mL via INTRAVENOUS

## 2020-10-11 MED ORDER — LACTATED RINGERS IV BOLUS
1000.0000 mL | Freq: Once | INTRAVENOUS | Status: AC
  Administered 2020-10-11: 1000 mL via INTRAVENOUS

## 2020-10-11 MED ORDER — SODIUM CHLORIDE 0.9 % IV SOLN: Freq: Once | INTRAVENOUS | Status: AC

## 2020-10-11 MED ORDER — DICLOFENAC SODIUM 1 % EX GEL
2.0000 g | Freq: Once | CUTANEOUS | Status: AC
  Administered 2020-10-11: 22:00:00 2 g via TOPICAL
  Filled 2020-10-11: qty 100

## 2020-10-11 MED ORDER — ENSURE PRE-SURGERY PO LIQD
296.0000 mL | Freq: Once | ORAL | Status: AC
  Administered 2020-10-11: 296 mL via ORAL
  Filled 2020-10-11: qty 296

## 2020-10-11 NOTE — ED Notes (Signed)
Date and time results received: 10/11/20 1611 (use smartphrase ".now" to insert current time)  Test: WBC of 83.2 & Platelets of 22 Critical Value: See above note  Name of Provider Notified: Dr. Ron Parker  Orders Received? Or Actions Taken?: Orders Received - See Orders for details

## 2020-10-11 NOTE — ED Triage Notes (Signed)
Patient c/o SOB since this AM according to the daughter. Patient's daughter reports that the patient has not been eating food, but drinking Boost, etc.

## 2020-10-11 NOTE — ED Notes (Signed)
Dr. Ron Parker at bedside.

## 2020-10-11 NOTE — ED Provider Notes (Signed)
°  Provider Note MRN:  [id number]  Arrival date & time: 10/11/20    ED Course and Medical Decision Making  Assumed care from Dr. Ron Parker at shift change.  New onset leukemia awaiting oncology/hematology bed at [facility] panel as well as uric acid to determine need for FFP versus rasburicase.  Lab evaluation does not warrant rasburicase or cryoprecipitate, still awaiting bed at [facility] on my evaluation.  No complaints at this time.  .Critical Care Performed by: Maudie Flakes, MD Authorized by: Maudie Flakes, MD   Critical care provider statement:    Critical care time (minutes):  32   Critical care was necessary to treat or prevent imminent or life-threatening deterioration of the following conditions: leukemia. acute blast crisis.   Critical care was time spent personally by me on the following activities:  Discussions with consultants, evaluation of patient's response to treatment, examination of patient, ordering and performing treatments and interventions, ordering and review of laboratory studies, ordering and review of radiographic studies, pulse oximetry, re-evaluation of patient's condition, obtaining history from patient or surrogate and review of old charts   I assumed direction of critical care for this patient from another provider in my specialty: yes      Final Clinical Impressions(s) / ED Diagnoses     ICD-10-CM   1. Anemia, unspecified type  D64.9   2. SOB (shortness of breath)  R06.02 DG Chest [facility] 1 View    DG Chest [facility] 1 View  3. Thrombocytopenia ([facility])  D69.6   4. AKI (acute kidney injury) ([facility])  N17.9   5. Leukocytosis, unspecified type  D72.829     ED Discharge Orders    None      Discharge Instructions   None     Barth Kirks. Sedonia Small, [place] [email or web address]    Maudie Flakes, MD 10/12/20 [phone]

## 2020-10-11 NOTE — ED Provider Notes (Signed)
[facility] DEPT Provider Note   CSN: [id number] Arrival date & time: 10/11/20  1452     History Chief Complaint  Patient presents with  . Shortness of Breath    Carolyn [name] is a 86 y.o. female.   Shortness of Breath Severity:  Moderate Onset quality:  Gradual Timing:  Constant Progression:  Worsening Chronicity:  New Context: activity   Relieved by:  Nothing Worsened by:  Activity Ineffective treatments:  None tried Associated symptoms: no chest pain, no cough, no fever, no headaches, no rash and no vomiting        Past Medical History:  Diagnosis Date  . Asthma   . Cancer ([place])   . Cataracts, bilateral   . Chronic kidney disease 2007   kidney cancer and partial nephrctomy-right  . COPD (chronic obstructive pulmonary disease) ([place])   . Diabetes mellitus   . Gallstones   . Glaucoma   . Hypercholesterolemia   . Hypertension   . Hypothyroid   . PONV (postoperative nausea and vomiting)   . Subdural bleeding [facility])     Patient Active Problem List   Diagnosis Date Noted  . Subdural hematoma ([place]) 03/23/2016  . Bradycardia 03/23/2016  . Late effect of head trauma, cognitive deficits   . Dysphagia   . Seizures ([place])   . Acute lower UTI   . Altered mental status   . Atelectasis   . Spastic hemiplegia affecting right side 02/19/2016  . S/P craniotomy 02/17/2016  . Traumatic subdural hematoma ([place]) 02/12/2016  . Headache as late effect of brain injury ([place])   . Seizure ([place])   . Difficulty controlling behavior as late effect of traumatic brain injury ([place])   . 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 Memory dysfunction as late effect of traumatic brain injury ([place])   . Benign essential HTN   . DM type 2 with diabetic peripheral neuropathy ([place])   . Thyroid activity decreased   . TBI (traumatic brain injury) ([place])   . Cognitive deficit as late effect of  traumatic brain injury ([place])   . Falls frequently   . Controlled type 2 diabetes mellitus with complication, without long-term current use of insulin ([place])   . Tachycardia   . Chronic obstructive pulmonary disease ([place])   . Chronic bronchitis ([place])   . SDH (subdural hematoma) ([place]) 01/20/2016  . Fall 01/20/2016  . Closed fracture of left elbow 01/20/2016  . Diabetes mellitus without complication ([place])   . Hypercholesterolemia   . COPD (chronic obstructive pulmonary disease) ([place])   . Hypothyroid   . Essential hypertension   . Inguinal hernia unilateral, non-recurrent, left 12/02/2011    Past Surgical History:  Procedure Laterality Date  . ABDOMINAL HYSTERECTOMY    . BREAST BIOPSY    . Cateract extractions and IOL    . CHOLECYSTECTOMY  2008  . CRANIOTOMY Left 02/06/2016   Procedure: CRANIOTOMY HEMATOMA EVACUATION SUBDURAL;  Surgeon: Kevan Ny Ditty, MD;  Location: [facility];  Service: Neurosurgery;  Laterality: Left;  . CRANIOTOMY N/A 02/17/2016   Procedure: CRANIOTOMY HEMATOMA EVACUATION SUBDURAL;  Surgeon: Kevan Ny Ditty, MD;  Location: [facility];  Service: Neurosurgery;  Laterality: N/A;  left  . CRANIOTOMY Left 02/18/2016   Procedure: CRANIOTOMY HEMATOMA EVACUATION SUBDURAL;  Surgeon: Earnie Larsson, MD;  Location: [place];  Service: Neurosurgery;  Laterality: Left;  . D&C for enlarged uterine  210  . DILATION  AND CURETTAGE OF UTERUS    . EYE SURGERY  08/2011,09/2012   bilateral cataracts  . HERNIA REPAIR  01/19/12   inguinal  . INGUINAL HERNIA REPAIR  01/19/2012   Procedure: HERNIA REPAIR INGUINAL ADULT;  Surgeon: Earnstine Regal, MD;  Location: [facility];  Service: General;  Laterality: Left;  repair left inguinal hernia with mesh   . PARTIAL NEPHRECTOMY     cancer  . TOTAL ABDOMINAL HYSTERECTOMY W/ BILATERAL SALPINGOOPHORECTOMY  2011     OB History   No obstetric history on file.     Family History  Problem Relation Age of Onset  . Hypertension Mother   .  Hypertension Father   . Breast cancer Paternal Grandmother   . Diabetes Paternal Grandmother   . Hypertension Paternal Grandmother   . Stroke Paternal Grandmother   . Stroke Paternal Grandfather   . Heart attack Paternal Grandfather     Social History   Tobacco Use  . Smoking status: Former Smoker    Packs/day: 1.00    Years: 5.00    Pack years: 5.00    Types: Cigarettes    Quit date: 01/11/1982    Years since quitting: 38.7  . Smokeless tobacco: Never Used  . Tobacco comment: quit maybe 22yrs  Vaping Use  . Vaping Use: Never used  Substance Use Topics  . Alcohol use: No  . Drug use: No    Home Medications Prior to Admission medications   Medication Sig Start Date End Date Taking? Authorizing Provider  acetaminophen (TYLENOL) 500 MG tablet Take 500 mg by mouth every 6 (six) hours as needed for headache (pain).    [provider]  albuterol (PROVENTIL HFA;VENTOLIN HFA) 108 (90 Base) MCG/ACT inhaler Inhale 1 puff into the lungs every 6 (six) hours as needed for wheezing or shortness of breath.    [provider]  docusate sodium (ENEMEEZ) 283 MG enema Place 1 enema (283 mg total) rectally daily. Patient not taking: Reported on 09/21/2017 04/02/17   Carmin Muskrat, MD  Fluticasone-Salmeterol (ADVAIR) 250-50 MCG/DOSE AEPB Inhale 1 puff into the lungs 2 (two) times daily as needed (shortness of breath/ chest tightness).    [provider]  glimepiride (AMARYL) 1 MG tablet Take 1 mg by mouth at bedtime.    [provider]  glimepiride (AMARYL) 2 MG tablet Take 2 tablets (4 mg total) by mouth at bedtime. Patient not taking: Reported on 04/05/2017 03/06/16   Angiulli, Lavon Paganini, PA-C  latanoprost (XALATAN) 0.005 % ophthalmic solution Place 1 drop into both eyes at bedtime.     [provider]  levothyroxine (SYNTHROID) 88 MCG tablet Take 88 mcg by mouth every morning. 04/04/20   [provider]  lisinopril (PRINIVIL,ZESTRIL) 20 MG  tablet Take 1 tablet (20 mg total) by mouth at bedtime. 03/06/16   Angiulli, Lavon Paganini, PA-C  metoprolol tartrate (LOPRESSOR) 25 MG tablet Take 1 tablet (25 mg total) by mouth 2 (two) times daily. Patient not taking: Reported on 04/05/2017 03/06/16   Angiulli, Lavon Paganini, PA-C  ondansetron (ZOFRAN ODT) 4 MG disintegrating tablet Take 1 tablet (4 mg total) by mouth every 8 (eight) hours as needed for nausea or vomiting. Patient not taking: Reported on 04/29/2020 04/05/17   Joy, Raquel Sarna C, PA-C  ondansetron (ZOFRAN) 4 MG tablet Take 1 tablet (4 mg total) by mouth every 8 (eight) hours as needed for nausea or vomiting. Patient not taking: Reported on 04/29/2020 03/24/17   Lennice Sites, DO  pantoprazole ([place])  40 MG tablet Take 1 tablet (40 mg total) by mouth daily. 03/06/16   Angiulli, Lavon Paganini, PA-C  polyethylene glycol (MIRALAX / GLYCOLAX) packet Take 17 g by mouth daily as needed (constipation). Mix in 8 oz liquid and drink    [provider]  theophylline (UNIPHYL) 400 MG 24 hr tablet Take 400 mg by mouth at bedtime.     [provider]  thyroid (ARMOUR) 60 MG tablet Take 60 mg by mouth at bedtime.  Patient not taking: Reported on 04/29/2020    [provider]  verapamil (CALAN-SR) 180 MG CR tablet Take 180 mg by mouth at bedtime.    [provider]    Allergies    Darvocet [propoxyphene n-acetaminophen], Metformin and related, Percocet [oxycodone-acetaminophen], Prednisone, Topamax [topiramate], Tramadol, Vimpat [lacosamide], and Vicodin [hydrocodone-acetaminophen]  Review of Systems   Review of Systems  Constitutional: Positive for appetite change, fatigue and unexpected weight change. Negative for chills and fever.  HENT: Negative for congestion and rhinorrhea.   Respiratory: Positive for shortness of breath. Negative for cough.   Cardiovascular: Negative for chest pain and palpitations.  Gastrointestinal: Negative for diarrhea, nausea and vomiting.    Genitourinary: Negative for difficulty urinating and dysuria.  Musculoskeletal: Negative for arthralgias and back pain.  Skin: Negative for rash and wound.  Neurological: Negative for light-headedness and headaches.    Physical Exam Updated Vital Signs BP 139/73   Pulse 72   Temp 98 F (36.7 C) (Oral)   Resp 19   Ht 5\' 6"  (1.676 m)   Wt 72.6 kg   LMP 03/24/2010   SpO2 99%   BMI 25.82 kg/m   Physical Exam Vitals and nursing note reviewed. Exam conducted with a chaperone present.  Constitutional:      General: She is not in acute distress.    Appearance: Normal appearance.  HENT:     Head: Normocephalic and atraumatic.     Nose: No rhinorrhea.  Eyes:     General:        Right eye: No discharge.        Left eye: No discharge.     Conjunctiva/sclera: Conjunctivae normal.  Cardiovascular:     Rate and Rhythm: Normal rate and regular rhythm.  Pulmonary:     Effort: Pulmonary effort is normal. No respiratory distress.     Breath sounds: No stridor. No decreased breath sounds or wheezing.  Chest:     Chest wall: No mass or tenderness.  Abdominal:     General: Abdomen is flat. There is no distension.     Palpations: Abdomen is soft.  Musculoskeletal:        General: No tenderness or signs of injury.     Right lower leg: No edema.     Left lower leg: No edema.  Skin:    General: Skin is warm and dry.  Neurological:     General: No focal deficit present.     Mental Status: She is alert. Mental status is at baseline.     Motor: No weakness.  Psychiatric:        Mood and Affect: Mood normal.        Behavior: Behavior normal.     ED Results / Procedures / Treatments   Labs (all labs ordered are listed, but only abnormal results are displayed) Labs Reviewed  CBC WITH DIFFERENTIAL/PLATELET - Abnormal; Notable for the following components:      Result Value   WBC 83.2 (*)  RBC 2.25 (*)    Hemoglobin 7.2 (*)    HCT 22.0 (*)    RDW 18.0 (*)    Platelets 22 (*)     nRBC 0.3 (*)    Lymphs Abs 7.5 (*)    Monocytes Absolute 5.0 (*)    All other components within normal limits  COMPREHENSIVE METABOLIC PANEL - Abnormal; Notable for the following components:   Sodium 134 (*)    Glucose, Bld 315 (*)    BUN 25 (*)    Creatinine, Ser 1.12 (*)    Albumin 3.1 (*)    AST 51 (*)    ALT 52 (*)    Alkaline Phosphatase 150 (*)    GFR, Estimated 48 (*)    All other components within normal limits  D-DIMER, QUANTITATIVE (NOT AT [facility]) - Abnormal; Notable for the following components:   D-Dimer, Quant 6.11 (*)    All other components within normal limits  RESP PANEL BY RT-PCR (FLU A&B, COVID) ARPGX2  URINALYSIS, ROUTINE W REFLEX MICROSCOPIC  MAGNESIUM  TSH  PATHOLOGIST SMEAR REVIEW  URIC ACID  DIC (DISSEMINATED INTRAVASCULAR COAGULATION) PANEL (NOT AT [facility])  FIBRINOGEN    EKG None  Radiology CT Angio Chest PE W and/or Wo Contrast  Result Date: 10/11/2020 CLINICAL DATA:  86 year old female with positive D-dimer. Concern for pulmonary embolism. EXAM: CT ANGIOGRAPHY CHEST WITH CONTRAST TECHNIQUE: Multidetector CT imaging of the chest was performed using the standard protocol during bolus administration of intravenous contrast. Multiplanar CT image reconstructions and MIPs were obtained to evaluate the vascular anatomy. CONTRAST:  49mL OMNIPAQUE IOHEXOL 350 MG/ML SOLN COMPARISON:  Chest CT dated 10/15/2011. Chest radiograph dated 10/11/2020 FINDINGS: Cardiovascular: There is mild cardiomegaly. Three vessel coronary vascular calcification. No pericardial effusion. Top-normal ascending aorta measuring up to 4 cm on sagittal AP diameter. There is mild atherosclerotic calcification of the thoracic aorta. There is dilatation of the main pulmonary trunk suggestive of a degree of pulmonary hypertension. Clinical correlation is recommended. Evaluation of the pulmonary arteries is limited due to respiratory motion artifact. No large or central pulmonary artery embolus  identified. Mediastinum/Nodes: There is no hilar or mediastinal adenopathy. The esophagus is grossly unremarkable. No mediastinal fluid collection. Lungs/Pleura: There is a 5 mm left upper lobe nodule not significantly changed since the study of 2012. 5 mm nodule in the left lower lobe along the fissure (66/6) increased in size since study of 2012. There is a 1 cm left lung base subpleural nodule (94/6). No lobar consolidation, pleural effusion or pneumothorax. The central airways are patent. Upper Abdomen: No acute abnormality. Musculoskeletal: No chest wall abnormality. No acute or significant osseous findings. Review of the MIP images confirms the above findings. IMPRESSION: 1. No acute intrathoracic pathology. No CT evidence of central pulmonary artery embolus. 2. Mild cardiomegaly with three vessel coronary vascular calcification. 3. Dilatation of the main pulmonary trunk suggestive of a degree of pulmonary hypertension. Clinical correlation is recommended. 4. Several pulmonary nodules measuring up to 1 cm in the left lower lobe. Non-contrast chest CT at 3-6 months is recommended. If the nodules are stable at time of repeat CT, then future CT at 18-24 months (from today's scan) is considered optional for low-risk patients, but is recommended for high-risk patients. This recommendation follows the consensus statement: Guidelines for Management of Incidental Pulmonary Nodules Detected on CT Images: From the Fleischner Society 2017; Radiology 2017; 284:228-243. 5. Aortic Atherosclerosis (ICD10-I70.0). Electronically Signed   By: Anner Crete M.D.   On: 10/11/2020 19:39  DG Chest Port 1 View  Result Date: 10/11/2020 CLINICAL DATA:  Shortness of breath EXAM: PORTABLE CHEST 1 VIEW COMPARISON:  03/20/2020 FINDINGS: No focal opacity or pleural effusion. Normal cardiomediastinal silhouette with aortic atherosclerosis. No pneumothorax. IMPRESSION: No active disease. Electronically Signed   By: Donavan Foil  M.D.   On: 10/11/2020 16:46    Procedures Procedures (including critical care time)  Medications Ordered in ED Medications  hydroxyurea (HYDREA) capsule 2,000 mg (2,000 mg Oral Given 10/11/20 2233)  lactated ringers bolus 1,000 mL (0 mLs Intravenous Stopped 10/11/20 1722)  sodium chloride (PF) 0.9 % injection (  Given by Other 10/11/20 1917)  iohexol (OMNIPAQUE) 350 MG/ML injection 60 mL (60 mLs Intravenous Contrast Given 10/11/20 1909)  feeding supplement (ENSURE PRE-SURGERY) liquid 296 mL (296 mLs Oral Given 10/11/20 2136)  diclofenac Sodium (VOLTAREN) 1 % topical gel 2 g (2 g Topical Given 10/11/20 2227)  0.9 %  sodium chloride infusion ( Intravenous New Bag/Given 10/11/20 2232)    ED Course  I have reviewed the triage vital signs and the nursing notes.  Pertinent labs & imaging results that were available during my care of the patient were reviewed by me and considered in my medical decision making (see chart for details).    MDM Rules/Calculators/A&P                          Worsening fatigue, family thinks stress related, poor appetite.  Concern for dehydration.  Dark appearing urine.  Normal bowel movements per patient.  No recent infections or illness.  History of TBI and stroke.  No new changes from neurologic status.  Equal strength in all extremities.  Able answer questions.  Vital signs at triage stable, she will need work-up for failure to thrive weight loss.  Mild elevation in creatinine relative to baseline, concerning for mild acute kidney injury, rehydration was given.  No significant electrolyte derangements.  D-dimer elevated will need CT scan.  CBC shows significant elevation white blood cell count at 83, thrombocytopenia and anemia.  Concern for possible new leukemia lymphoma.  Will need further work-up.  I spoken to our hematologist on-call, they recommend transfer to [facility].  [facility] Dr. Mariea Clonts accepts this patient and he will work on making  a bed.  He wants [place] to check a fibrinogen level, if it is greater than 100 he wants 10 units of cryoprecipitate.  Once uric acid level, fits.  Intent he wants rest.  K 3 mg.  And he wants [place] to start hydroxyurea 2 g now and then every 8 hours.  He also wants [place] to start maintenance fluids at 75 cc/h of normal saline.  Final Clinical Impression(s) / ED Diagnoses Final diagnoses:  Anemia, unspecified type  Thrombocytopenia (HCC)  AKI (acute kidney injury) ([place])  Leukocytosis, unspecified type    Rx / DC Orders ED Discharge Orders    None       Breck Coons, MD 10/11/20 2304

## 2020-10-12 DIAGNOSIS — I639 Cerebral infarction, unspecified: Secondary | ICD-10-CM | POA: Diagnosis not present

## 2020-10-12 DIAGNOSIS — I4891 Unspecified atrial fibrillation: Secondary | ICD-10-CM | POA: Diagnosis not present

## 2020-10-12 DIAGNOSIS — G9389 Other specified disorders of brain: Secondary | ICD-10-CM | POA: Diagnosis not present

## 2020-10-12 DIAGNOSIS — Z515 Encounter for palliative care: Secondary | ICD-10-CM | POA: Diagnosis not present

## 2020-10-12 DIAGNOSIS — R634 Abnormal weight loss: Secondary | ICD-10-CM | POA: Diagnosis not present

## 2020-10-12 DIAGNOSIS — E1122 Type 2 diabetes mellitus with diabetic chronic kidney disease: Secondary | ICD-10-CM | POA: Diagnosis not present

## 2020-10-12 DIAGNOSIS — R0602 Shortness of breath: Secondary | ICD-10-CM | POA: Diagnosis not present

## 2020-10-12 DIAGNOSIS — D696 Thrombocytopenia, unspecified: Secondary | ICD-10-CM | POA: Diagnosis not present

## 2020-10-12 DIAGNOSIS — I129 Hypertensive chronic kidney disease with stage 1 through stage 4 chronic kidney disease, or unspecified chronic kidney disease: Secondary | ICD-10-CM | POA: Diagnosis not present

## 2020-10-12 DIAGNOSIS — I6389 Other cerebral infarction: Secondary | ICD-10-CM | POA: Diagnosis not present

## 2020-10-12 DIAGNOSIS — Z90710 Acquired absence of both cervix and uterus: Secondary | ICD-10-CM | POA: Diagnosis not present

## 2020-10-12 DIAGNOSIS — Z79899 Other long term (current) drug therapy: Secondary | ICD-10-CM | POA: Diagnosis not present

## 2020-10-12 DIAGNOSIS — E119 Type 2 diabetes mellitus without complications: Secondary | ICD-10-CM | POA: Diagnosis not present

## 2020-10-12 DIAGNOSIS — J449 Chronic obstructive pulmonary disease, unspecified: Secondary | ICD-10-CM | POA: Diagnosis not present

## 2020-10-12 DIAGNOSIS — N183 Chronic kidney disease, stage 3 unspecified: Secondary | ICD-10-CM | POA: Diagnosis not present

## 2020-10-12 DIAGNOSIS — Z66 Do not resuscitate: Secondary | ICD-10-CM | POA: Diagnosis not present

## 2020-10-12 DIAGNOSIS — M542 Cervicalgia: Secondary | ICD-10-CM | POA: Diagnosis not present

## 2020-10-12 DIAGNOSIS — D709 Neutropenia, unspecified: Secondary | ICD-10-CM | POA: Diagnosis not present

## 2020-10-12 DIAGNOSIS — R531 Weakness: Secondary | ICD-10-CM | POA: Diagnosis not present

## 2020-10-12 DIAGNOSIS — Z85528 Personal history of other malignant neoplasm of kidney: Secondary | ICD-10-CM | POA: Diagnosis not present

## 2020-10-12 DIAGNOSIS — I082 Rheumatic disorders of both aortic and tricuspid valves: Secondary | ICD-10-CM | POA: Diagnosis not present

## 2020-10-12 DIAGNOSIS — I498 Other specified cardiac arrhythmias: Secondary | ICD-10-CM | POA: Diagnosis not present

## 2020-10-12 DIAGNOSIS — I2693 Single subsegmental pulmonary embolism without acute cor pulmonale: Secondary | ICD-10-CM | POA: Diagnosis not present

## 2020-10-12 DIAGNOSIS — C9590 Leukemia, unspecified not having achieved remission: Secondary | ICD-10-CM | POA: Diagnosis not present

## 2020-10-12 DIAGNOSIS — I4510 Unspecified right bundle-branch block: Secondary | ICD-10-CM | POA: Diagnosis not present

## 2020-10-12 DIAGNOSIS — E039 Hypothyroidism, unspecified: Secondary | ICD-10-CM | POA: Diagnosis not present

## 2020-10-12 DIAGNOSIS — D649 Anemia, unspecified: Secondary | ICD-10-CM | POA: Diagnosis not present

## 2020-10-12 DIAGNOSIS — N189 Chronic kidney disease, unspecified: Secondary | ICD-10-CM | POA: Diagnosis not present

## 2020-10-12 DIAGNOSIS — K219 Gastro-esophageal reflux disease without esophagitis: Secondary | ICD-10-CM | POA: Diagnosis not present

## 2020-10-12 DIAGNOSIS — Z87891 Personal history of nicotine dependence: Secondary | ICD-10-CM | POA: Diagnosis not present

## 2020-10-12 DIAGNOSIS — I2699 Other pulmonary embolism without acute cor pulmonale: Secondary | ICD-10-CM | POA: Diagnosis not present

## 2020-10-12 DIAGNOSIS — R4182 Altered mental status, unspecified: Secondary | ICD-10-CM | POA: Diagnosis not present

## 2020-10-12 DIAGNOSIS — C9200 Acute myeloblastic leukemia, not having achieved remission: Secondary | ICD-10-CM | POA: Diagnosis not present

## 2020-10-12 DIAGNOSIS — E785 Hyperlipidemia, unspecified: Secondary | ICD-10-CM | POA: Diagnosis not present

## 2020-10-12 DIAGNOSIS — I491 Atrial premature depolarization: Secondary | ICD-10-CM | POA: Diagnosis not present

## 2020-10-12 LAB — DIC (DISSEMINATED INTRAVASCULAR COAGULATION)PANEL
D-Dimer, Quant: 4.58 ug/mL-FEU — ABNORMAL HIGH (ref 0.00–0.50)
Fibrinogen: 609 mg/dL — ABNORMAL HIGH (ref 210–475)
INR: 1.2 (ref 0.8–1.2)
Platelets: 17 10*3/uL — CL (ref 150–400)
Prothrombin Time: 15.2 seconds (ref 11.4–15.2)
aPTT: 36 seconds (ref 24–36)

## 2020-10-12 LAB — FIBRINOGEN: Fibrinogen: 622 mg/dL — ABNORMAL HIGH (ref 210–475)

## 2020-10-12 NOTE — ED Notes (Signed)
Gave report to nurse Janett Billow accepting care for this patient at [facility]. [facility] will be [facility] to [facility].

## 2020-10-12 NOTE — ED Notes (Signed)
[facility] arrive and taking patient to [facility].

## 2020-10-12 NOTE — ED Notes (Signed)
[facility] was called and said they were "backed up." [facility] was called and confirmed they will transport the patient.

## 2020-10-13 DIAGNOSIS — I6389 Other cerebral infarction: Secondary | ICD-10-CM | POA: Diagnosis not present

## 2020-10-13 DIAGNOSIS — D709 Neutropenia, unspecified: Secondary | ICD-10-CM | POA: Diagnosis not present

## 2020-10-13 DIAGNOSIS — D649 Anemia, unspecified: Secondary | ICD-10-CM | POA: Diagnosis not present

## 2020-10-13 DIAGNOSIS — R0602 Shortness of breath: Secondary | ICD-10-CM | POA: Diagnosis not present

## 2020-10-13 DIAGNOSIS — R531 Weakness: Secondary | ICD-10-CM | POA: Diagnosis not present

## 2020-10-13 DIAGNOSIS — R4182 Altered mental status, unspecified: Secondary | ICD-10-CM | POA: Diagnosis not present

## 2020-10-13 DIAGNOSIS — R634 Abnormal weight loss: Secondary | ICD-10-CM | POA: Diagnosis not present

## 2020-10-13 DIAGNOSIS — D696 Thrombocytopenia, unspecified: Secondary | ICD-10-CM | POA: Diagnosis not present

## 2020-10-13 DIAGNOSIS — G9389 Other specified disorders of brain: Secondary | ICD-10-CM | POA: Diagnosis not present

## 2020-10-14 DIAGNOSIS — N183 Chronic kidney disease, stage 3 unspecified: Secondary | ICD-10-CM | POA: Diagnosis not present

## 2020-10-14 DIAGNOSIS — I639 Cerebral infarction, unspecified: Secondary | ICD-10-CM | POA: Diagnosis not present

## 2020-10-14 DIAGNOSIS — E119 Type 2 diabetes mellitus without complications: Secondary | ICD-10-CM | POA: Diagnosis not present

## 2020-10-14 DIAGNOSIS — M542 Cervicalgia: Secondary | ICD-10-CM | POA: Diagnosis not present

## 2020-10-14 DIAGNOSIS — Z85528 Personal history of other malignant neoplasm of kidney: Secondary | ICD-10-CM | POA: Diagnosis not present

## 2020-10-14 DIAGNOSIS — I2699 Other pulmonary embolism without acute cor pulmonale: Secondary | ICD-10-CM | POA: Diagnosis not present

## 2020-10-14 DIAGNOSIS — I082 Rheumatic disorders of both aortic and tricuspid valves: Secondary | ICD-10-CM | POA: Diagnosis not present

## 2020-10-14 DIAGNOSIS — I129 Hypertensive chronic kidney disease with stage 1 through stage 4 chronic kidney disease, or unspecified chronic kidney disease: Secondary | ICD-10-CM | POA: Diagnosis not present

## 2020-10-14 DIAGNOSIS — C9200 Acute myeloblastic leukemia, not having achieved remission: Secondary | ICD-10-CM | POA: Diagnosis not present

## 2020-10-14 DIAGNOSIS — I6389 Other cerebral infarction: Secondary | ICD-10-CM | POA: Diagnosis not present

## 2020-10-14 LAB — PATHOLOGIST SMEAR REVIEW

## 2020-10-16 DIAGNOSIS — I493 Ventricular premature depolarization: Secondary | ICD-10-CM | POA: Diagnosis not present

## 2020-10-16 DIAGNOSIS — I480 Paroxysmal atrial fibrillation: Secondary | ICD-10-CM | POA: Diagnosis not present

## 2020-10-16 DIAGNOSIS — I639 Cerebral infarction, unspecified: Secondary | ICD-10-CM | POA: Diagnosis not present

## 2020-10-16 DIAGNOSIS — I4510 Unspecified right bundle-branch block: Secondary | ICD-10-CM | POA: Diagnosis not present

## 2020-10-16 DIAGNOSIS — K219 Gastro-esophageal reflux disease without esophagitis: Secondary | ICD-10-CM | POA: Diagnosis not present

## 2020-10-16 DIAGNOSIS — I498 Other specified cardiac arrhythmias: Secondary | ICD-10-CM | POA: Diagnosis not present

## 2020-10-16 DIAGNOSIS — R4182 Altered mental status, unspecified: Secondary | ICD-10-CM | POA: Diagnosis not present

## 2020-10-16 DIAGNOSIS — I10 Essential (primary) hypertension: Secondary | ICD-10-CM | POA: Diagnosis not present

## 2020-10-16 DIAGNOSIS — C9200 Acute myeloblastic leukemia, not having achieved remission: Secondary | ICD-10-CM | POA: Diagnosis not present

## 2020-10-16 DIAGNOSIS — D61818 Other pancytopenia: Secondary | ICD-10-CM | POA: Diagnosis not present

## 2020-10-17 DIAGNOSIS — I10 Essential (primary) hypertension: Secondary | ICD-10-CM | POA: Diagnosis not present

## 2020-10-17 DIAGNOSIS — C9200 Acute myeloblastic leukemia, not having achieved remission: Secondary | ICD-10-CM | POA: Diagnosis not present

## 2020-10-17 DIAGNOSIS — I480 Paroxysmal atrial fibrillation: Secondary | ICD-10-CM | POA: Diagnosis not present

## 2020-10-17 DIAGNOSIS — R4182 Altered mental status, unspecified: Secondary | ICD-10-CM | POA: Diagnosis not present

## 2020-10-17 DIAGNOSIS — K219 Gastro-esophageal reflux disease without esophagitis: Secondary | ICD-10-CM | POA: Diagnosis not present

## 2020-10-17 DIAGNOSIS — D61818 Other pancytopenia: Secondary | ICD-10-CM | POA: Diagnosis not present

## 2020-10-17 DIAGNOSIS — I639 Cerebral infarction, unspecified: Secondary | ICD-10-CM | POA: Diagnosis not present

## 2020-10-23 ENCOUNTER — Other Ambulatory Visit: Payer: PPO | Admitting: Adult Health Nurse Practitioner

## 2020-10-23 ENCOUNTER — Other Ambulatory Visit: Payer: Self-pay

## 2020-10-23 ENCOUNTER — Telehealth: Payer: Self-pay | Admitting: Adult Health Nurse Practitioner

## 2020-10-23 NOTE — Telephone Encounter (Signed)
Spoke with daughter to confirm appointment for this afternoon.  [name] informed me that her mother had passed away over the weekend.  Informed her to call me to set up bereavement for her or her family if needed. Tajanae Guilbault K. Olena Heckle NP

## 2020-10-24 ENCOUNTER — Ambulatory Visit: Payer: PPO | Admitting: Interventional Cardiology

## 2020-11-16 DEATH — deceased
